# Patient Record
Sex: Female | Born: 1940
Health system: Southern US, Community
[De-identification: ages and names within clinical notes are randomized; demographics above are authoritative.]

## PROBLEM LIST (undated history)

## (undated) DIAGNOSIS — E669 Obesity, unspecified: Secondary | ICD-10-CM

## (undated) DIAGNOSIS — J329 Chronic sinusitis, unspecified: Secondary | ICD-10-CM

## (undated) DIAGNOSIS — K219 Gastro-esophageal reflux disease without esophagitis: Secondary | ICD-10-CM

## (undated) DIAGNOSIS — M1710 Unilateral primary osteoarthritis, unspecified knee: Secondary | ICD-10-CM

## (undated) DIAGNOSIS — K449 Diaphragmatic hernia without obstruction or gangrene: Secondary | ICD-10-CM

## (undated) DIAGNOSIS — F17200 Nicotine dependence, unspecified, uncomplicated: Secondary | ICD-10-CM

## (undated) DIAGNOSIS — R911 Solitary pulmonary nodule: Secondary | ICD-10-CM

## (undated) DIAGNOSIS — J449 Chronic obstructive pulmonary disease, unspecified: Secondary | ICD-10-CM

## (undated) DIAGNOSIS — I10 Essential (primary) hypertension: Secondary | ICD-10-CM

## (undated) DIAGNOSIS — M179 Osteoarthritis of knee, unspecified: Secondary | ICD-10-CM

## (undated) HISTORY — DX: Chronic sinusitis, unspecified: J32.9

## (undated) HISTORY — PX: OTHER SURGICAL HISTORY: SHX169

## (undated) HISTORY — DX: Unilateral primary osteoarthritis, unspecified knee: M17.10

## (undated) HISTORY — DX: Gastro-esophageal reflux disease without esophagitis: K21.9

## (undated) HISTORY — PX: PARTIAL HYSTERECTOMY: SHX80

## (undated) HISTORY — DX: Nicotine dependence, unspecified, uncomplicated: F17.200

## (undated) HISTORY — DX: Osteoarthritis of knee, unspecified: M17.9

## (undated) HISTORY — DX: Solitary pulmonary nodule: R91.1

## (undated) HISTORY — DX: Essential (primary) hypertension: I10

## (undated) HISTORY — DX: Obesity, unspecified: E66.9

## (undated) HISTORY — DX: Chronic obstructive pulmonary disease, unspecified: J44.9

## (undated) HISTORY — DX: Diaphragmatic hernia without obstruction or gangrene: K44.9

---

## 2002-07-23 DIAGNOSIS — I10 Essential (primary) hypertension: Secondary | ICD-10-CM

## 2002-07-23 HISTORY — DX: Essential (primary) hypertension: I10

## 2003-06-29 ENCOUNTER — Ambulatory Visit (HOSPITAL_COMMUNITY): Admission: RE | Admit: 2003-06-29 | Discharge: 2003-06-29 | Payer: Self-pay | Admitting: Family Medicine

## 2003-07-13 ENCOUNTER — Emergency Department (HOSPITAL_COMMUNITY): Admission: EM | Admit: 2003-07-13 | Discharge: 2003-07-13 | Payer: Self-pay | Admitting: Emergency Medicine

## 2003-11-22 ENCOUNTER — Ambulatory Visit (HOSPITAL_COMMUNITY): Admission: RE | Admit: 2003-11-22 | Discharge: 2003-11-22 | Payer: Self-pay | Admitting: Family Medicine

## 2004-03-20 ENCOUNTER — Ambulatory Visit (HOSPITAL_COMMUNITY): Admission: RE | Admit: 2004-03-20 | Discharge: 2004-03-20 | Payer: Self-pay | Admitting: Family Medicine

## 2004-04-18 ENCOUNTER — Ambulatory Visit (HOSPITAL_COMMUNITY): Admission: RE | Admit: 2004-04-18 | Discharge: 2004-04-18 | Payer: Self-pay | Admitting: Family Medicine

## 2004-04-22 HISTORY — PX: COLONOSCOPY: SHX174

## 2004-04-26 LAB — HM COLONOSCOPY: HM Colonoscopy: NORMAL

## 2004-05-18 ENCOUNTER — Ambulatory Visit (HOSPITAL_COMMUNITY): Admission: RE | Admit: 2004-05-18 | Discharge: 2004-05-18 | Payer: Self-pay | Admitting: General Surgery

## 2004-05-25 ENCOUNTER — Ambulatory Visit: Payer: Self-pay | Admitting: Family Medicine

## 2004-05-31 ENCOUNTER — Ambulatory Visit: Payer: Self-pay | Admitting: Family Medicine

## 2004-07-04 ENCOUNTER — Ambulatory Visit: Payer: Self-pay | Admitting: Family Medicine

## 2004-08-21 ENCOUNTER — Ambulatory Visit: Payer: Self-pay | Admitting: Family Medicine

## 2004-12-19 ENCOUNTER — Ambulatory Visit: Payer: Self-pay | Admitting: Family Medicine

## 2004-12-20 ENCOUNTER — Ambulatory Visit: Payer: Self-pay | Admitting: *Deleted

## 2004-12-21 ENCOUNTER — Ambulatory Visit (HOSPITAL_COMMUNITY): Admission: RE | Admit: 2004-12-21 | Discharge: 2004-12-21 | Payer: Self-pay | Admitting: Family Medicine

## 2004-12-22 ENCOUNTER — Ambulatory Visit (HOSPITAL_COMMUNITY): Admission: RE | Admit: 2004-12-22 | Discharge: 2004-12-22 | Payer: Self-pay | Admitting: Family Medicine

## 2004-12-26 ENCOUNTER — Ambulatory Visit: Payer: Self-pay | Admitting: Cardiology

## 2004-12-26 ENCOUNTER — Encounter (HOSPITAL_COMMUNITY): Admission: RE | Admit: 2004-12-26 | Discharge: 2005-01-25 | Payer: Self-pay | Admitting: *Deleted

## 2005-01-01 ENCOUNTER — Ambulatory Visit: Payer: Self-pay | Admitting: *Deleted

## 2005-01-11 ENCOUNTER — Encounter (INDEPENDENT_AMBULATORY_CARE_PROVIDER_SITE_OTHER): Payer: Self-pay | Admitting: General Surgery

## 2005-01-11 ENCOUNTER — Ambulatory Visit (HOSPITAL_COMMUNITY): Admission: RE | Admit: 2005-01-11 | Discharge: 2005-01-11 | Payer: Self-pay | Admitting: General Surgery

## 2005-01-30 ENCOUNTER — Ambulatory Visit: Payer: Self-pay | Admitting: Family Medicine

## 2005-03-13 ENCOUNTER — Ambulatory Visit: Payer: Self-pay | Admitting: Family Medicine

## 2005-04-02 ENCOUNTER — Ambulatory Visit: Payer: Self-pay | Admitting: Family Medicine

## 2005-04-14 ENCOUNTER — Emergency Department (HOSPITAL_COMMUNITY): Admission: EM | Admit: 2005-04-14 | Discharge: 2005-04-14 | Payer: Self-pay | Admitting: Emergency Medicine

## 2005-04-16 ENCOUNTER — Ambulatory Visit (HOSPITAL_COMMUNITY): Admission: RE | Admit: 2005-04-16 | Discharge: 2005-04-16 | Payer: Self-pay | Admitting: Family Medicine

## 2005-04-16 ENCOUNTER — Ambulatory Visit: Payer: Self-pay | Admitting: Family Medicine

## 2005-04-19 ENCOUNTER — Ambulatory Visit: Payer: Self-pay | Admitting: Orthopedic Surgery

## 2005-05-03 ENCOUNTER — Ambulatory Visit: Payer: Self-pay | Admitting: Orthopedic Surgery

## 2005-05-29 ENCOUNTER — Ambulatory Visit: Payer: Self-pay | Admitting: Orthopedic Surgery

## 2005-05-29 ENCOUNTER — Encounter: Payer: Self-pay | Admitting: Orthopedic Surgery

## 2005-05-29 ENCOUNTER — Inpatient Hospital Stay (HOSPITAL_COMMUNITY): Admission: RE | Admit: 2005-05-29 | Discharge: 2005-06-03 | Payer: Self-pay | Admitting: Orthopedic Surgery

## 2005-05-29 HISTORY — PX: OTHER SURGICAL HISTORY: SHX169

## 2005-06-11 ENCOUNTER — Ambulatory Visit: Payer: Self-pay | Admitting: Orthopedic Surgery

## 2005-06-18 ENCOUNTER — Ambulatory Visit: Payer: Self-pay | Admitting: Orthopedic Surgery

## 2005-06-28 ENCOUNTER — Ambulatory Visit: Payer: Self-pay | Admitting: Orthopedic Surgery

## 2005-07-03 ENCOUNTER — Encounter (HOSPITAL_COMMUNITY): Admission: RE | Admit: 2005-07-03 | Discharge: 2005-07-18 | Payer: Self-pay | Admitting: Orthopedic Surgery

## 2005-07-05 ENCOUNTER — Ambulatory Visit: Payer: Self-pay | Admitting: Family Medicine

## 2005-07-25 ENCOUNTER — Encounter (HOSPITAL_COMMUNITY): Admission: RE | Admit: 2005-07-25 | Discharge: 2005-08-24 | Payer: Self-pay | Admitting: Orthopedic Surgery

## 2005-08-02 ENCOUNTER — Ambulatory Visit: Payer: Self-pay | Admitting: Orthopedic Surgery

## 2005-08-16 ENCOUNTER — Ambulatory Visit: Payer: Self-pay | Admitting: Family Medicine

## 2005-08-24 ENCOUNTER — Encounter (HOSPITAL_COMMUNITY): Admission: RE | Admit: 2005-08-24 | Discharge: 2005-09-23 | Payer: Self-pay | Admitting: Orthopedic Surgery

## 2005-09-03 ENCOUNTER — Ambulatory Visit: Payer: Self-pay | Admitting: Orthopedic Surgery

## 2005-09-24 ENCOUNTER — Encounter (HOSPITAL_COMMUNITY): Admission: RE | Admit: 2005-09-24 | Discharge: 2005-10-24 | Payer: Self-pay | Admitting: Orthopedic Surgery

## 2005-11-14 ENCOUNTER — Ambulatory Visit: Payer: Self-pay | Admitting: Orthopedic Surgery

## 2005-11-26 ENCOUNTER — Ambulatory Visit (HOSPITAL_COMMUNITY): Admission: RE | Admit: 2005-11-26 | Discharge: 2005-11-26 | Payer: Self-pay | Admitting: Family Medicine

## 2005-11-26 ENCOUNTER — Ambulatory Visit: Payer: Self-pay | Admitting: Family Medicine

## 2005-12-27 ENCOUNTER — Ambulatory Visit (HOSPITAL_COMMUNITY): Admission: RE | Admit: 2005-12-27 | Discharge: 2005-12-27 | Payer: Self-pay | Admitting: Family Medicine

## 2006-01-26 ENCOUNTER — Encounter: Payer: Self-pay | Admitting: Family Medicine

## 2006-01-31 ENCOUNTER — Other Ambulatory Visit: Admission: RE | Admit: 2006-01-31 | Discharge: 2006-01-31 | Payer: Self-pay | Admitting: Family Medicine

## 2006-01-31 ENCOUNTER — Ambulatory Visit: Payer: Self-pay | Admitting: Family Medicine

## 2006-03-28 ENCOUNTER — Ambulatory Visit: Payer: Self-pay | Admitting: Family Medicine

## 2006-04-25 ENCOUNTER — Ambulatory Visit: Payer: Self-pay | Admitting: Orthopedic Surgery

## 2006-05-09 ENCOUNTER — Ambulatory Visit: Payer: Self-pay | Admitting: Family Medicine

## 2006-08-21 ENCOUNTER — Ambulatory Visit: Payer: Self-pay | Admitting: Family Medicine

## 2006-08-29 ENCOUNTER — Ambulatory Visit (HOSPITAL_COMMUNITY): Admission: RE | Admit: 2006-08-29 | Discharge: 2006-08-29 | Payer: Self-pay | Admitting: Family Medicine

## 2006-09-09 ENCOUNTER — Ambulatory Visit: Payer: Self-pay | Admitting: Family Medicine

## 2006-09-10 ENCOUNTER — Encounter: Payer: Self-pay | Admitting: Family Medicine

## 2006-09-11 ENCOUNTER — Ambulatory Visit (HOSPITAL_COMMUNITY): Admission: RE | Admit: 2006-09-11 | Discharge: 2006-09-11 | Payer: Self-pay | Admitting: Family Medicine

## 2006-09-18 ENCOUNTER — Ambulatory Visit: Payer: Self-pay | Admitting: Family Medicine

## 2006-09-28 ENCOUNTER — Emergency Department (HOSPITAL_COMMUNITY): Admission: EM | Admit: 2006-09-28 | Discharge: 2006-09-28 | Payer: Self-pay | Admitting: Emergency Medicine

## 2006-10-03 ENCOUNTER — Ambulatory Visit: Payer: Self-pay | Admitting: Orthopedic Surgery

## 2006-11-20 ENCOUNTER — Ambulatory Visit: Payer: Self-pay | Admitting: Family Medicine

## 2006-11-21 ENCOUNTER — Encounter: Payer: Self-pay | Admitting: Family Medicine

## 2006-11-21 LAB — CONVERTED CEMR LAB
ALT: 12 units/L (ref 0–35)
AST: 12 units/L (ref 0–37)
Alkaline Phosphatase: 97 units/L (ref 39–117)
Bilirubin, Direct: 0.1 mg/dL (ref 0.0–0.3)
Cholesterol: 180 mg/dL (ref 0–200)
Indirect Bilirubin: 0.5 mg/dL (ref 0.0–0.9)
Total Protein: 7 g/dL (ref 6.0–8.3)

## 2007-01-13 ENCOUNTER — Ambulatory Visit (HOSPITAL_COMMUNITY): Admission: RE | Admit: 2007-01-13 | Discharge: 2007-01-13 | Payer: Self-pay | Admitting: Family Medicine

## 2007-02-17 ENCOUNTER — Ambulatory Visit: Payer: Self-pay | Admitting: Orthopedic Surgery

## 2007-03-28 ENCOUNTER — Ambulatory Visit: Payer: Self-pay | Admitting: Family Medicine

## 2007-03-28 LAB — CONVERTED CEMR LAB
BUN: 11 mg/dL (ref 6–23)
Basophils Relative: 1 % (ref 0–1)
Creatinine, Ser: 0.86 mg/dL (ref 0.40–1.20)
Eosinophils Absolute: 0.3 10*3/uL (ref 0.0–0.7)
Eosinophils Relative: 3 % (ref 0–5)
HCT: 39.3 % (ref 36.0–46.0)
HDL: 45 mg/dL (ref >39)
Indirect Bilirubin: 0.4 mg/dL (ref 0.0–0.9)
Lymphs Abs: 2.6 10*3/uL (ref 0.7–3.3)
MCHC: 34.4 g/dL (ref 30.0–36.0)
MCV: 86.9 fL (ref 78.0–100.0)
Neutro Abs: 4 10*3/uL (ref 1.7–7.7)
Platelets: 282 10*3/uL (ref 150–400)
RDW: 14.7 % — ABNORMAL HIGH (ref 11.5–14.0)
WBC: 7.6 10*3/uL (ref 4.0–10.5)

## 2007-04-08 ENCOUNTER — Ambulatory Visit: Payer: Self-pay | Admitting: Cardiology

## 2007-04-15 ENCOUNTER — Encounter: Payer: Self-pay | Admitting: Orthopedic Surgery

## 2007-04-23 ENCOUNTER — Ambulatory Visit (HOSPITAL_COMMUNITY): Admission: RE | Admit: 2007-04-23 | Discharge: 2007-04-23 | Payer: Self-pay | Admitting: Family Medicine

## 2007-04-23 ENCOUNTER — Ambulatory Visit: Payer: Self-pay | Admitting: Family Medicine

## 2007-05-07 ENCOUNTER — Ambulatory Visit: Payer: Self-pay | Admitting: Family Medicine

## 2007-05-07 ENCOUNTER — Ambulatory Visit: Payer: Self-pay | Admitting: Cardiology

## 2007-06-13 ENCOUNTER — Ambulatory Visit: Payer: Self-pay | Admitting: Family Medicine

## 2007-07-24 ENCOUNTER — Encounter: Payer: Self-pay | Admitting: Family Medicine

## 2007-07-28 ENCOUNTER — Ambulatory Visit: Payer: Self-pay | Admitting: Family Medicine

## 2007-07-29 ENCOUNTER — Encounter: Payer: Self-pay | Admitting: Family Medicine

## 2007-07-29 LAB — CONVERTED CEMR LAB
ALT: 11 units/L (ref 0–35)
AST: 15 units/L (ref 0–37)
Alkaline Phosphatase: 121 units/L — ABNORMAL HIGH (ref 39–117)
Bilirubin, Direct: 0.1 mg/dL (ref 0.0–0.3)
Helicobacter Pylori Antibody-IgG: 0.6
Total CHOL/HDL Ratio: 3.4
Total Protein: 7.4 g/dL (ref 6.0–8.3)

## 2007-10-28 ENCOUNTER — Ambulatory Visit: Payer: Self-pay | Admitting: Family Medicine

## 2007-10-30 ENCOUNTER — Encounter: Payer: Self-pay | Admitting: Family Medicine

## 2007-10-30 DIAGNOSIS — F17200 Nicotine dependence, unspecified, uncomplicated: Secondary | ICD-10-CM | POA: Insufficient documentation

## 2007-10-30 DIAGNOSIS — J4489 Other specified chronic obstructive pulmonary disease: Secondary | ICD-10-CM | POA: Insufficient documentation

## 2007-10-30 DIAGNOSIS — J449 Chronic obstructive pulmonary disease, unspecified: Secondary | ICD-10-CM

## 2007-10-30 DIAGNOSIS — I10 Essential (primary) hypertension: Secondary | ICD-10-CM

## 2007-10-30 DIAGNOSIS — M1710 Unilateral primary osteoarthritis, unspecified knee: Secondary | ICD-10-CM | POA: Insufficient documentation

## 2007-10-30 DIAGNOSIS — E669 Obesity, unspecified: Secondary | ICD-10-CM

## 2007-11-04 ENCOUNTER — Encounter: Payer: Self-pay | Admitting: Family Medicine

## 2008-01-14 ENCOUNTER — Ambulatory Visit (HOSPITAL_COMMUNITY): Admission: RE | Admit: 2008-01-14 | Discharge: 2008-01-14 | Payer: Self-pay | Admitting: Family Medicine

## 2008-01-27 ENCOUNTER — Ambulatory Visit: Payer: Self-pay | Admitting: Family Medicine

## 2008-01-27 LAB — CONVERTED CEMR LAB
AST: 17 units/L (ref 0–37)
BUN: 15 mg/dL (ref 6–23)
Bilirubin, Direct: 0.1 mg/dL (ref 0.0–0.3)
Cholesterol: 210 mg/dL — ABNORMAL HIGH (ref 0–200)
Creatinine, Ser: 0.91 mg/dL (ref 0.40–1.20)
Glucose, Bld: 109 mg/dL — ABNORMAL HIGH (ref 70–99)
HDL: 41 mg/dL (ref >39)
Indirect Bilirubin: 0.3 mg/dL (ref 0.0–0.9)
LDL Cholesterol: 143 mg/dL — ABNORMAL HIGH (ref 0–99)
Potassium: 4.3 meq/L (ref 3.5–5.3)
Sodium: 140 meq/L (ref 135–145)
Total CHOL/HDL Ratio: 5.1
VLDL: 26 mg/dL (ref 0–40)

## 2008-01-28 ENCOUNTER — Telehealth: Payer: Self-pay | Admitting: Orthopedic Surgery

## 2008-02-13 ENCOUNTER — Ambulatory Visit: Payer: Self-pay | Admitting: Family Medicine

## 2008-03-02 ENCOUNTER — Ambulatory Visit: Payer: Self-pay | Admitting: Family Medicine

## 2008-03-02 ENCOUNTER — Ambulatory Visit (HOSPITAL_COMMUNITY): Admission: RE | Admit: 2008-03-02 | Discharge: 2008-03-02 | Payer: Self-pay | Admitting: Family Medicine

## 2008-03-02 DIAGNOSIS — I509 Heart failure, unspecified: Secondary | ICD-10-CM | POA: Insufficient documentation

## 2008-03-02 DIAGNOSIS — E785 Hyperlipidemia, unspecified: Secondary | ICD-10-CM | POA: Insufficient documentation

## 2008-03-03 ENCOUNTER — Encounter: Payer: Self-pay | Admitting: Family Medicine

## 2008-03-03 LAB — CONVERTED CEMR LAB
Calcium: 8.9 mg/dL (ref 8.4–10.5)
Chloride: 101 meq/L (ref 96–112)
Creatinine, Ser: 0.68 mg/dL (ref 0.40–1.20)
Glucose, Bld: 104 mg/dL — ABNORMAL HIGH (ref 70–99)
Sodium: 141 meq/L (ref 135–145)

## 2008-04-19 ENCOUNTER — Other Ambulatory Visit: Admission: RE | Admit: 2008-04-19 | Discharge: 2008-04-19 | Payer: Self-pay | Admitting: Family Medicine

## 2008-04-19 ENCOUNTER — Encounter: Payer: Self-pay | Admitting: Family Medicine

## 2008-04-19 ENCOUNTER — Ambulatory Visit: Payer: Self-pay | Admitting: Family Medicine

## 2008-04-19 DIAGNOSIS — H547 Unspecified visual loss: Secondary | ICD-10-CM

## 2008-05-25 ENCOUNTER — Encounter: Payer: Self-pay | Admitting: Family Medicine

## 2008-05-26 LAB — CONVERTED CEMR LAB
ALT: 16 units/L (ref 0–35)
BUN: 13 mg/dL (ref 6–23)
Bilirubin, Direct: 0.1 mg/dL (ref 0.0–0.3)
CO2: 22 meq/L (ref 19–32)
Chloride: 106 meq/L (ref 96–112)
Cholesterol: 138 mg/dL (ref 0–200)
Indirect Bilirubin: 0.5 mg/dL (ref 0.0–0.9)
Total CHOL/HDL Ratio: 3.1
Triglycerides: 97 mg/dL (ref <150)

## 2008-05-27 ENCOUNTER — Ambulatory Visit: Payer: Self-pay | Admitting: Family Medicine

## 2008-05-27 ENCOUNTER — Encounter: Payer: Self-pay | Admitting: Family Medicine

## 2008-05-28 ENCOUNTER — Encounter: Payer: Self-pay | Admitting: Family Medicine

## 2008-05-28 LAB — CONVERTED CEMR LAB: Microalb Creat Ratio: 6.7 mg/g (ref 0.0–30.0)

## 2008-05-31 DIAGNOSIS — R7303 Prediabetes: Secondary | ICD-10-CM

## 2008-06-21 ENCOUNTER — Ambulatory Visit: Payer: Self-pay | Admitting: Family Medicine

## 2008-06-21 DIAGNOSIS — R5383 Other fatigue: Secondary | ICD-10-CM

## 2008-06-21 DIAGNOSIS — R5381 Other malaise: Secondary | ICD-10-CM

## 2008-06-21 LAB — CONVERTED CEMR LAB
Basophils Absolute: 0.1 10*3/uL (ref 0.0–0.1)
Eosinophils Relative: 3 % (ref 0–5)
HCT: 39.9 % (ref 36.0–46.0)
Lymphocytes Relative: 37 % (ref 12–46)
Monocytes Relative: 10 % (ref 3–12)
Neutro Abs: 4.4 10*3/uL (ref 1.7–7.7)
RBC: 4.58 M/uL (ref 3.87–5.11)
RDW: 15.4 % (ref 11.5–15.5)

## 2008-07-22 ENCOUNTER — Ambulatory Visit: Payer: Self-pay | Admitting: Family Medicine

## 2008-09-16 ENCOUNTER — Ambulatory Visit: Payer: Self-pay | Admitting: Family Medicine

## 2008-09-16 LAB — CONVERTED CEMR LAB
Bilirubin Urine: NEGATIVE
Blood in Urine, dipstick: NEGATIVE
Glucose, Bld: 114 mg/dL
Glucose, Urine, Semiquant: NEGATIVE
Hgb A1c MFr Bld: 6.5 %
Nitrite: NEGATIVE
Protein, U semiquant: NEGATIVE
Specific Gravity, Urine: 1.015
Urobilinogen, UA: 0.2
WBC Urine, dipstick: NEGATIVE
pH: 5.5

## 2008-10-18 ENCOUNTER — Ambulatory Visit: Payer: Self-pay | Admitting: Family Medicine

## 2008-10-18 LAB — CONVERTED CEMR LAB: Blood Glucose, Fasting: 125 mg/dL

## 2008-10-25 ENCOUNTER — Ambulatory Visit (HOSPITAL_COMMUNITY): Admission: RE | Admit: 2008-10-25 | Discharge: 2008-10-25 | Payer: Self-pay | Admitting: Family Medicine

## 2008-11-11 ENCOUNTER — Emergency Department (HOSPITAL_COMMUNITY): Admission: EM | Admit: 2008-11-11 | Discharge: 2008-11-12 | Payer: Self-pay | Admitting: Emergency Medicine

## 2008-11-15 ENCOUNTER — Encounter: Payer: Self-pay | Admitting: Family Medicine

## 2008-11-15 LAB — CONVERTED CEMR LAB
ALT: 12 units/L (ref 0–35)
Albumin: 3.9 g/dL (ref 3.5–5.2)
BUN: 12 mg/dL (ref 6–23)
Bilirubin, Direct: 0.2 mg/dL (ref 0.0–0.3)
Chloride: 103 meq/L (ref 96–112)
Creatinine, Ser: 0.81 mg/dL (ref 0.40–1.20)
Glucose, Bld: 116 mg/dL — ABNORMAL HIGH (ref 70–99)
HDL: 49 mg/dL (ref >39)
Indirect Bilirubin: 0.4 mg/dL (ref 0.0–0.9)
LDL Cholesterol: 122 mg/dL — ABNORMAL HIGH (ref 0–99)
Potassium: 4.6 meq/L (ref 3.5–5.3)
Total Bilirubin: 0.6 mg/dL (ref 0.3–1.2)
Total CHOL/HDL Ratio: 3.9
Total Protein: 7.2 g/dL (ref 6.0–8.3)
Triglycerides: 92 mg/dL (ref <150)
VLDL: 18 mg/dL (ref 0–40)

## 2008-11-26 ENCOUNTER — Encounter: Payer: Self-pay | Admitting: Family Medicine

## 2008-12-15 ENCOUNTER — Ambulatory Visit: Payer: Self-pay | Admitting: Family Medicine

## 2008-12-15 LAB — CONVERTED CEMR LAB: Hgb A1c MFr Bld: 5.6 % (ref 4.6–6.1)

## 2009-01-17 ENCOUNTER — Ambulatory Visit (HOSPITAL_COMMUNITY): Admission: RE | Admit: 2009-01-17 | Discharge: 2009-01-17 | Payer: Self-pay | Admitting: Family Medicine

## 2009-02-04 ENCOUNTER — Encounter: Payer: Self-pay | Admitting: Family Medicine

## 2009-02-15 ENCOUNTER — Ambulatory Visit (HOSPITAL_COMMUNITY): Admission: RE | Admit: 2009-02-15 | Discharge: 2009-02-15 | Payer: Self-pay | Admitting: Ophthalmology

## 2009-02-17 LAB — CONVERTED CEMR LAB
BUN: 13 mg/dL (ref 6–23)
Bilirubin, Direct: 0.1 mg/dL (ref 0.0–0.3)
Calcium: 9 mg/dL (ref 8.4–10.5)
HDL: 51 mg/dL (ref >39)
Total Bilirubin: 0.5 mg/dL (ref 0.3–1.2)
Total Protein: 7.5 g/dL (ref 6.0–8.3)
VLDL: 18 mg/dL (ref 0–40)

## 2009-03-01 ENCOUNTER — Ambulatory Visit (HOSPITAL_COMMUNITY): Admission: RE | Admit: 2009-03-01 | Discharge: 2009-03-01 | Payer: Self-pay | Admitting: Ophthalmology

## 2009-03-17 ENCOUNTER — Ambulatory Visit: Payer: Self-pay | Admitting: Family Medicine

## 2009-03-17 LAB — CONVERTED CEMR LAB: Hgb A1c MFr Bld: 6.2 %

## 2009-04-14 ENCOUNTER — Ambulatory Visit (HOSPITAL_COMMUNITY): Admission: RE | Admit: 2009-04-14 | Discharge: 2009-04-14 | Payer: Self-pay | Admitting: Family Medicine

## 2009-04-14 ENCOUNTER — Ambulatory Visit: Payer: Self-pay | Admitting: Family Medicine

## 2009-04-14 DIAGNOSIS — M79609 Pain in unspecified limb: Secondary | ICD-10-CM

## 2009-04-14 DIAGNOSIS — M25579 Pain in unspecified ankle and joints of unspecified foot: Secondary | ICD-10-CM

## 2009-04-14 LAB — CONVERTED CEMR LAB: Glucose, Bld: 148 mg/dL

## 2009-04-18 LAB — CONVERTED CEMR LAB
BUN: 14 mg/dL (ref 6–23)
Basophils Relative: 0 % (ref 0–1)
CO2: 23 meq/L (ref 19–32)
Chloride: 101 meq/L (ref 96–112)
Eosinophils Absolute: 0.3 10*3/uL (ref 0.0–0.7)
Eosinophils Relative: 3 % (ref 0–5)
Hemoglobin: 13 g/dL (ref 12.0–15.0)
Lymphocytes Relative: 34 % (ref 12–46)
Lymphs Abs: 3.2 10*3/uL (ref 0.7–4.0)
MCHC: 34.4 g/dL (ref 30.0–36.0)
Neutro Abs: 5.2 10*3/uL (ref 1.7–7.7)
Neutrophils Relative %: 55 % (ref 43–77)
Sodium: 139 meq/L (ref 135–145)
TSH: 0.779 microintl units/mL (ref 0.350–4.500)
WBC: 9.4 10*3/uL (ref 4.0–10.5)

## 2009-05-26 ENCOUNTER — Ambulatory Visit: Payer: Self-pay | Admitting: Family Medicine

## 2009-06-07 ENCOUNTER — Telehealth: Payer: Self-pay | Admitting: Family Medicine

## 2009-08-22 ENCOUNTER — Ambulatory Visit: Payer: Self-pay | Admitting: Family Medicine

## 2009-08-22 DIAGNOSIS — IMO0002 Reserved for concepts with insufficient information to code with codable children: Secondary | ICD-10-CM | POA: Insufficient documentation

## 2009-08-22 LAB — CONVERTED CEMR LAB: Blood Glucose, Fasting: 109 mg/dL

## 2009-08-23 ENCOUNTER — Telehealth: Payer: Self-pay | Admitting: Family Medicine

## 2009-08-24 ENCOUNTER — Encounter (INDEPENDENT_AMBULATORY_CARE_PROVIDER_SITE_OTHER): Payer: Self-pay

## 2009-08-24 LAB — CONVERTED CEMR LAB
AST: 18 units/L (ref 0–37)
Albumin: 3.9 g/dL (ref 3.5–5.2)
Alkaline Phosphatase: 90 units/L (ref 39–117)
Calcium: 8.8 mg/dL (ref 8.4–10.5)
Chloride: 103 meq/L (ref 96–112)
Creatinine, Urine: 143.1 mg/dL
Glucose, Bld: 93 mg/dL (ref 70–99)
HDL: 48 mg/dL (ref >39)
LDL Cholesterol: 116 mg/dL — ABNORMAL HIGH (ref 0–99)
Microalb Creat Ratio: 17.5 mg/g (ref 0.0–30.0)
Potassium: 4 meq/L (ref 3.5–5.3)
Sodium: 142 meq/L (ref 135–145)
Total CHOL/HDL Ratio: 3.8
Total Protein: 6.9 g/dL (ref 6.0–8.3)
Triglycerides: 79 mg/dL (ref <150)

## 2009-11-16 ENCOUNTER — Ambulatory Visit: Payer: Self-pay | Admitting: Family Medicine

## 2009-11-22 LAB — CONVERTED CEMR LAB
ALT: 10 units/L (ref 0–35)
Alkaline Phosphatase: 109 units/L (ref 39–117)
BUN: 11 mg/dL (ref 6–23)
CO2: 26 meq/L (ref 19–32)
Calcium: 8.4 mg/dL (ref 8.4–10.5)
Glucose, Bld: 94 mg/dL (ref 70–99)
Indirect Bilirubin: 0.6 mg/dL (ref 0.0–0.9)
Sodium: 138 meq/L (ref 135–145)
Total Protein: 7.3 g/dL (ref 6.0–8.3)

## 2010-01-19 ENCOUNTER — Ambulatory Visit (HOSPITAL_COMMUNITY): Admission: RE | Admit: 2010-01-19 | Discharge: 2010-01-19 | Payer: Self-pay | Admitting: Family Medicine

## 2010-02-15 ENCOUNTER — Ambulatory Visit: Payer: Self-pay | Admitting: Family Medicine

## 2010-02-15 DIAGNOSIS — M899 Disorder of bone, unspecified: Secondary | ICD-10-CM | POA: Insufficient documentation

## 2010-02-15 DIAGNOSIS — M949 Disorder of cartilage, unspecified: Secondary | ICD-10-CM

## 2010-02-15 DIAGNOSIS — J209 Acute bronchitis, unspecified: Secondary | ICD-10-CM

## 2010-02-21 LAB — CONVERTED CEMR LAB
ALT: 12 units/L (ref 0–35)
Bilirubin, Direct: 0.1 mg/dL (ref 0.0–0.3)
Chloride: 100 meq/L (ref 96–112)
Cholesterol: 194 mg/dL (ref 0–200)
Creatinine, Ser: 0.92 mg/dL (ref 0.40–1.20)
Hgb A1c MFr Bld: 5.5 % (ref <5.7)
Potassium: 4.4 meq/L (ref 3.5–5.3)
Total Protein: 7.5 g/dL (ref 6.0–8.3)
VLDL: 30 mg/dL (ref 0–40)
Vit D, 25-Hydroxy: 15 ng/mL — ABNORMAL LOW (ref 30–89)

## 2010-05-02 ENCOUNTER — Other Ambulatory Visit: Admission: RE | Admit: 2010-05-02 | Discharge: 2010-05-02 | Payer: Self-pay | Admitting: Family Medicine

## 2010-05-02 ENCOUNTER — Ambulatory Visit: Payer: Self-pay | Admitting: Family Medicine

## 2010-05-02 LAB — CONVERTED CEMR LAB: OCCULT 1: NEGATIVE

## 2010-05-06 ENCOUNTER — Encounter: Payer: Self-pay | Admitting: Family Medicine

## 2010-05-06 DIAGNOSIS — J0100 Acute maxillary sinusitis, unspecified: Secondary | ICD-10-CM

## 2010-05-09 ENCOUNTER — Encounter: Payer: Self-pay | Admitting: Family Medicine

## 2010-05-09 ENCOUNTER — Telehealth: Payer: Self-pay | Admitting: Family Medicine

## 2010-05-17 ENCOUNTER — Encounter: Payer: Self-pay | Admitting: Family Medicine

## 2010-05-29 ENCOUNTER — Encounter: Payer: Self-pay | Admitting: Orthopedic Surgery

## 2010-05-30 ENCOUNTER — Ambulatory Visit: Payer: Self-pay | Admitting: Orthopedic Surgery

## 2010-05-30 DIAGNOSIS — IMO0002 Reserved for concepts with insufficient information to code with codable children: Secondary | ICD-10-CM

## 2010-05-30 DIAGNOSIS — M25569 Pain in unspecified knee: Secondary | ICD-10-CM

## 2010-07-20 LAB — CONVERTED CEMR LAB
ALT: 12 units/L (ref 0–35)
AST: 14 units/L (ref 0–37)
Albumin: 4 g/dL (ref 3.5–5.2)
Alkaline Phosphatase: 80 units/L (ref 39–117)
Basophils Absolute: 0 10*3/uL (ref 0.0–0.1)
Basophils Relative: 1 % (ref 0–1)
Bilirubin, Direct: 0.1 mg/dL (ref 0.0–0.3)
Calcium: 8.6 mg/dL (ref 8.4–10.5)
Cholesterol: 235 mg/dL — ABNORMAL HIGH (ref 0–200)
Eosinophils Absolute: 0.2 10*3/uL (ref 0.0–0.7)
Eosinophils Relative: 3 % (ref 0–5)
HCT: 35.8 % — ABNORMAL LOW (ref 36.0–46.0)
HDL: 52 mg/dL (ref >39)
Hgb A1c MFr Bld: 6 % — ABNORMAL HIGH (ref <5.7)
MCHC: 34.6 g/dL (ref 30.0–36.0)
MCV: 88.4 fL (ref 78.0–100.0)
Platelets: 259 10*3/uL (ref 150–400)
RDW: 14.5 % (ref 11.5–15.5)
Sodium: 142 meq/L (ref 135–145)
Total CHOL/HDL Ratio: 4.5

## 2010-08-03 ENCOUNTER — Ambulatory Visit
Admission: RE | Admit: 2010-08-03 | Discharge: 2010-08-03 | Payer: Self-pay | Source: Home / Self Care | Attending: Family Medicine | Admitting: Family Medicine

## 2010-08-03 DIAGNOSIS — J019 Acute sinusitis, unspecified: Secondary | ICD-10-CM | POA: Insufficient documentation

## 2010-08-24 NOTE — Progress Notes (Signed)
  Phone Note Call from Patient   Summary of Call: Patient called in and said that she had been lightheaded today and wanted a work excuse because she had just got the job and didn't want to lose it. Advised her if she had to take the flexeril to only take half of it because it can have hat effect per dr. Lodema Hong. Patient can come in for work excuse for today only. Initial call taken by: Everitt Amber,  August 23, 2009 1:01 PM

## 2010-08-24 NOTE — Letter (Signed)
Summary: med review sheet  med review sheet   Imported By: Rudene Anda 05/02/2010 16:55:15  _____________________________________________________________________  External Attachment:    Type:   Image     Comment:   External Document

## 2010-08-24 NOTE — Progress Notes (Signed)
Summary: CALL BACK  Phone Note Call from Patient   Summary of Call: WANTS YOU TO CALL HER BACK BEFORE 8:50 AM SHE HAS TO GO TO WORK Initial call taken by: Lind Guest,  May 09, 2010 8:12 AM  Follow-up for Phone Call        noted Follow-up by: Adella Hare LPN,  May 09, 2010 12:12 PM

## 2010-08-24 NOTE — Letter (Signed)
Summary: CONSULTS  CONSULTS   Imported By: Lind Guest 04/13/2010 09:19:06  _____________________________________________________________________  External Attachment:    Type:   Image     Comment:   External Document

## 2010-08-24 NOTE — Letter (Signed)
Summary: X RAYS  X RAYS   Imported By: Lind Guest 04/13/2010 09:25:59  _____________________________________________________________________  External Attachment:    Type:   Image     Comment:   External Document

## 2010-08-24 NOTE — Assessment & Plan Note (Signed)
Summary: physical   Vital Signs:  Patient profile:   70 year old female Menstrual status:  hysterectomy Height:      67 inches Weight:      210.25 pounds BMI:     33.05 O2 Sat:      97 % on Room air Pulse rate:   112 / minute Pulse rhythm:   regular Resp:     16 per minute BP sitting:   128 / 74  (left arm)  Vitals Entered By: Mauricia Area, CMA  Nutrition Counseling: Patient's BMI is greater than 25 and therefore counseled on weight management options.  O2 Flow:  Room air CC: follow up   CC:  follow up.  History of Present Illness: Reports  that she has been doing fairly well. 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 Denies recent fever or chills. Denies  ear pain or sore throat. Denies chest congestion, or cough productive of sputum. Denies chest pain, palpitations, PND, orthopnea or leg swelling. Denies abdominal pain, nausea, vomitting, diarrhea or constipation. Denies change in bowel movements or bloody stool. Denies dysuria , frequency, incontinence or hesitancy. reports intermittent  joint pain, swith r reduced mobility. Denies headaches, vertigo, seizures. Denies depression,she does have mild  anxiety and  insomnia. Denies  rash, lesions, or itch.     Preventive Screening-Counseling & Management  Alcohol-Tobacco     Smoking Cessation Counseling: yes  Allergies (verified): No Known Drug Allergies  Review of Systems      See HPI General:  Complains of fatigue. ENT:  Complains of nasal congestion, postnasal drainage, and sinus pressure; 10 day history, drainage is at times yellow. MS:  Complains of joint pain, low back pain, and stiffness. Psych:  Complains of anxiety; denies depression. Endo:  Denies excessive thirst and excessive urination; tests sugars regularly, and they remain within  the target rangea. Heme:  Denies abnormal bruising and  bleeding. Allergy:  Complains of seasonal allergies; denies hives or rash and itching eyes.  Physical Exam  General:  Well-developed,well-nourished,in no acute distress; alert,appropriate and cooperative throughout examination Head:  Normocephalic and atraumatic without obvious abnormalities. No apparent alopecia or balding.positive maxillary sinus tenderness Eyes:  No corneal or conjunctival inflammation noted. EOMI. Perrla. Funduscopic exam benign, without hemorrhages, exudates or papilledema. Vision grossly normal. Ears:  External ear exam shows no significant lesions or deformities.  Otoscopic examination reveals clear canals, tympanic membranes are intact bilaterally without bulging, retraction, inflammation or discharge. Hearing is grossly normal bilaterally. Nose:  External nasal examination shows no deformity or inflammation. Nasal mucosa are pink and moist without lesions or exudates. Mouth:  pharynx pink and moist and fair dentition.   Neck:  No deformities, masses, or tenderness noted. Chest Wall:  No deformities, masses, or tenderness noted. Breasts:  No mass, nodules, thickening, tenderness, bulging, retraction, inflamation, nipple discharge or skin changes noted.   Lungs:  Normal respiratory effort, chest expands symmetrically. Lungs are clear to auscultation, no crackles or wheezes. Heart:  Normal rate and regular rhythm. S1 and S2 normal without gallop, murmur, click, rub or other extra sounds. Abdomen:  Bowel sounds positive,abdomen soft and non-tender without masses, organomegaly or hernias noted. Rectal:  No external abnormalities noted. Normal sphincter tone. No rectal masses or tenderness.Guaic negative stool Genitalia:  normal introitus, no external lesions, mucosa pink and moist, and no adnexal masses or tenderness.  Uterus is absent  Msk:  No deformity  or scoliosis noted of thoracic or lumbar spine.   Pulses:  R and L carotid,radial,femoral,dorsalis pedis and posterior  tibial pulses are full and equal bilaterally Extremities:  decreased ROM thoracolumbar spine Neurologic:  No cranial nerve deficits noted. Station and gait are normal. Plantar reflexes are down-going bilaterally. DTRs are symmetrical throughout. Sensory, motor and coordinative functions appear intact.  Diabetes Management Exam:    Foot Exam (with socks and/or shoes not present):       Sensory-Monofilament:          Left foot: diminished          Right foot: diminished       Inspection:          Left foot: normal          Right foot: normal       Nails:          Left foot: thickened          Right foot: thickened   Impression & Recommendations:  Problem # 1:  DIABETES MELLITUS, TYPE II (ICD-250.00) Assessment Comment Only  The following medications were removed from the medication list:    Metformin Hcl 500 Mg Tabs (Metformin hcl) ..... One tab by mouth bid Her updated medication list for this problem includes:    Bayer Aspirin 325 Mg Tabs (Aspirin) .Marland Kitchen... Take 1 tablet by mouth once a day plan to d/c the metformin basedon normal hBA1C, will have nursing call her on this and fax d/c order to the pharmacy Labs Reviewed: Creat: 0.92 (02/21/2010)    Reviewed HgBA1c results: 5.5 (02/21/2010)  5.7 (11/19/2009)  Problem # 2:  HYPERLIPIDEMIA (ICD-272.4) Assessment: Comment Only  Her updated medication list for this problem includes:    Lovastatin 40 Mg Tabs (Lovastatin) .Marland Kitchen... 2 tablets at bedtime Low fat dietdiscussed and encouraged  Orders: T-Lipid Profile [id number]) T-Hepatic Function [phone])  Labs Reviewed: SGOT: 15 (02/21/2010)   SGPT: 12 (02/21/2010)   HDL:55 (02/21/2010), 52 (11/19/2009)  LDL:109 (02/21/2010), 128 (11/19/2009)  Chol:194 (02/21/2010), 200 (11/19/2009)  Trig:148 (02/21/2010), 102 (11/19/2009)  Problem # 3:  HYPERTENSION (ICD-401.9) Assessment: Unchanged  Her updated medication list for this problem includes:    Hydrochlorothiazide 25 Mg Tabs  (Hydrochlorothiazide) .Marland Kitchen... Take 1 tablet by mouth once a day    Amlodipine Besylate 10 Mg Tabs (Amlodipine besylate) .Marland Kitchen... Take 1 tablet by mouth once a day  Orders: T-Basic Metabolic Panel [phone])  BP today: 128/74 Prior BP: 124/80 (02/15/2010)  Labs Reviewed: K+: 4.4 (02/21/2010) Creat: : 0.92 (02/21/2010)   Chol: 194 (02/21/2010)   HDL: 55 (02/21/2010)   LDL: 109 (02/21/2010)   TG: 148 (02/21/2010)  Problem # 4:  OBESITY (ICD-278.00) Assessment: Unchanged  Ht: 67 (05/02/2010)   Wt: 210.25 (05/02/2010)   BMI: 33.05 (05/02/2010) therapeutic lifestyle change discussed and encouraged  Problem # 5:  NICOTINE ADDICTION (ICD-305.1) Assessment: Unchanged  Encouraged smoking cessation and discussed different methods for smoking cessation.   Problem # 6:  ACUTE MAXILLARY SINUSITIS (ICD-461.0)  The following medications were removed from the medication list:    Flonase 50 Mcg/act Susp (Fluticasone propionate) .Marland Kitchen... 2 puffs per nostril daily as needed    Penicillin V Potassium 500 Mg Tabs (Penicillin v potassium) .Marland Kitchen... Take 1 tablet by mouth three times a day Her updated medication list for this problem includes:    Septra Ds 800-160 Mg Tabs (Sulfamethoxazole-trimethoprim) .Marland Kitchen... Take 1 tablet by mouth two times a day  Complete Medication List: 1)  Klor-con M20 20 Meq Cr-tabs (  Potassium chloride crys cr) .... Take one tab by mouth once daily 2)  Geritol Complete Tabs (Iron-vitamins) .... Take one tab by mouth once daily 3)  Hydrochlorothiazide 25 Mg Tabs (Hydrochlorothiazide) .... Take 1 tablet by mouth once a day 4)  Amlodipine Besylate 10 Mg Tabs (Amlodipine besylate) .... Take 1 tablet by mouth once a day 5)  Accu-chek Compact Test Drum Strp (Glucose blood) .... Uad 6)  Accu-chek Multiclix Lancets Misc (Lancets) .... Uad 7)  Bayer Aspirin 325 Mg Tabs (Aspirin) .... Take 1 tablet by mouth once a day 8)  Advair Diskus 100-50 Mcg/dose Misc (Fluticasone-salmeterol) .Marland Kitchen.. 1 puff  twice daily 9)  Ventolin Hfa 108 (90 Base) Mcg/act Aers (Albuterol sulfate) .... 2 puffs every 6-8 hours as needed 10)  Duoneb 0.5-2.5 (3) Mg/31ml Soln (Ipratropium-albuterol) .... Three times a day prn 11)  Lovastatin 40 Mg Tabs (Lovastatin) .... 2 tablets at bedtime 12)  Septra Ds 800-160 Mg Tabs (Sulfamethoxazole-trimethoprim) .... Take 1 tablet by mouth two times a day 13)  Fluconazole 150 Mg Tabs (Fluconazole) .... Take 1 tablet by mouth once a day 14)  Cyclobenzaprine Hcl 10 Mg Tabs (Cyclobenzaprine hcl) .... Take 1 tab by mouth at bedtime as needed 15)  Prilosec 20 Mg Cpdr (Omeprazole) .... Take 1 capsule by mouth once a day as needed 16)  Aloe Vera 5000 Mg Caps (Aloe vera) .... One cap by mouth once daily 17)  Relacore Max Strength  .... One cap by mouth once daily 18)  Vitamin D 1000 Unit Tabs (Cholecalciferol) .... One cap by mouth once daily 19)  Vitamin B-12 1000 Mcg Tabs (Cyanocobalamin) .... One tab by mouth once daily  Other Orders: T-CBC w/Diff ([id number]) T- Hemoglobin A1C (09811-91478) Pap Smear (29562) Hemoccult Guaiac-1 spec.(in office) (13086)  Patient Instructions: 1)  Follow up appointment in 5.44months 2)  BMP prior to visit, ICD-9: 3)  Hepatic Panel prior to visit, ICD-9: 4)  Lipid Panel prior to visit, ICD-9:  fasting labs first or second week in December 5)  TSH prior to visit, ICD-9: 6)  CBC w/ Diff prior to visit, ICD-9: 7)  HbgA1C prior to visit, ICD-9: 8)  med sent in for sinuses. 9)  flu vac today 10)  Tobacco is very bad for your health and your loved ones! You Should stop smoking!. 11)  Stop Smoking Tips: Choose a Quit date. Cut down before the Quit date. decide what you will do as a substitute when you feel the urge to smoke(gum,toothpick,exercise). 12)  It is important that you exercise regularly at least 20 minutes 5 times a week. If you develop chest pain, have severe difficulty breathing, or feel very tired , stop exercising immediately and seek  medical attention. 13)  You need to lose weight. Consider a lower calorie diet and regular exercise.  Prescriptions: FLUCONAZOLE 150 MG TABS (FLUCONAZOLE) Take 1 tablet by mouth once a day  #1 x 0   Entered and Authorized by:   Syliva Overman MD   Signed by:   Syliva Overman MD on 05/02/2010   Method used:   Electronically to        [facility] [phone]* (retail)       [address]       [place], [place]  [address]       Ph: [phone] or [phone]       Fax: [phone]   RxID:   [id number] SEPTRA DS 800-160 MG TABS (SULFAMETHOXAZOLE-TRIMETHOPRIM)  Take 1 tablet by mouth two times a day  #14 x 0   Entered and Authorized by:   Syliva Overman MD   Signed by:   Syliva Overman MD on 05/02/2010   Method used:   Electronically to        [facility] [phone]* (retail)       [address]       [place], [place]  [address]       Ph: [phone] or [phone]       Fax: [phone]   RxID:   [id number]   Laboratory Results  Date/Time Received: May 02, 2010 4:30 PM  Date/Time Reported: May 02, 2010 4:30 PM   Stool - Occult Blood Hemmoccult #1: negative Date: 05/02/2010 Comments: 118 10/12 51301 13L 10/13 Adella Hare LPN  May 02, 2010 4:30 PM      Appended Document: physical pls contact pt , let her know based on her blood sugars I would like her to discontinue the metformin, I do not believe she needs this to control her sugars her dx of dibetes is changed to prediabetes, she needs to get the labs ordered in Dec, this will give me a good idea of how she is doing. I have written a d/c order to be faxed to her pharmacy , pls fax it  Appended Document: physical called patient, left message  Appended Document: physical called patient, left message  Appended Document: physical patient aware  Appended Document: physical   Influenza Vaccine    Vaccine Type: Fluvax MCR    Site: right deltoid    Mfr:  NOVARTIS    Dose: 0.5 ml    Route: IM    Given by: Adella Hare LPN    Exp. Date: 11/2010    Lot #: [id number]    VIS given: 02/13/07 version given May 09, 2010. GIVEN AT [facility]

## 2010-08-24 NOTE — Letter (Signed)
Summary: P HONE NOTES  P HONE NOTES   Imported By: Lind Guest 04/13/2010 09:23:03  _____________________________________________________________________  External Attachment:    Type:   Image     Comment:   External Document

## 2010-08-24 NOTE — Letter (Signed)
Summary: OFFICE NOTES  OFFICE NOTES   Imported By: Lind Guest 04/13/2010 09:22:20  _____________________________________________________________________  External Attachment:    Type:   Image     Comment:   External Document

## 2010-08-24 NOTE — Assessment & Plan Note (Signed)
Summary: office visit   Vital Signs:  Patient profile:   70 year old female Menstrual status:  hysterectomy Height:      67 inches Weight:      204.50 pounds BMI:     32.15 O2 Sat:      96 % Pulse rate:   95 / minute Pulse rhythm:   regular Resp:     16 per minute BP sitting:   124 / 80  (left arm) Cuff size:   large  Vitals Entered By: Everitt Amber LPN (February 15, 2010 2:34 PM)  Nutrition Counseling: Patient's BMI is greater than 25 and therefore counseled on weight management options. CC: has some congestion in chest for a couple weeks, producing yellowish phlegm   CC:  has some congestion in chest for a couple weeks and producing yellowish phlegm.  History of Present Illness: Reports  that she has been  doing well. Denies recent fever or chills. Denies sinus pressure, nasal congestion , ear pain or sore throat.  Denies chest pain, palpitations, PND, orthopnea or leg swelling. Denies abdominal pain, nausea, vomitting, diarrhea or constipation. Denies change in bowel movements or bloody stool. Denies dysuria , frequency, incontinence or hesitancy. . Denies headaches, vertigo, seizures. Denies depression, anxiety or insomnia. Denies  rash, lesions, or itch. Tests her blood sugar regularly,a nd fasting s are seldom over 120     Preventive Screening-Counseling & Management  Alcohol-Tobacco     Smoking Cessation Counseling: yes  Current Medications (verified): 1)  Klor-Con M20 20 Meq  Cr-Tabs (Potassium Chloride Crys Cr) .... Take One Tab By Mouth Once Daily 2)  Geritol Complete   Tabs (Iron-Vitamins) .... Take One Tab By Mouth Once Daily 3)  Hydrochlorothiazide 25 Mg  Tabs (Hydrochlorothiazide) .... Take 1 Tablet By Mouth Once A Day 4)  Amlodipine Besylate 10 Mg Tabs (Amlodipine Besylate) .... Take 1 Tablet By Mouth Once A Day 5)  Accu-Chek Compact Test Drum  Strp (Glucose Blood) .... Uad 6)  Accu-Chek Multiclix Lancets  Misc (Lancets) .... Uad 7)  Metformin Hcl 500  Mg Tabs (Metformin Hcl) .... One Tab By Mouth Bid 8)  Bayer Aspirin 325 Mg Tabs (Aspirin) .... Take 1 Tablet By Mouth Once A Day 9)  Prilosec Otc 20 Mg Tbec (Omeprazole Magnesium) .... Take 1 Tablet By Mouth Once A Day 10)  Advair Diskus 100-50 Mcg/dose Misc (Fluticasone-Salmeterol) .Marland Kitchen.. 1 Puff Twice Daily 11)  Ventolin Hfa 108 (90 Base) Mcg/act Aers (Albuterol Sulfate) .... 2 Puffs Every 6-8 Hours As Needed 12)  Duoneb 0.5-2.5 (3) Mg/69ml Soln (Ipratropium-Albuterol) .... Three Times A Day Prn 13)  Lovastatin 40 Mg Tabs (Lovastatin) .... 2 Tablets At Bedtime 14)  Flonase 50 Mcg/act Susp (Fluticasone Propionate) .... 2 Puffs Per Nostril Daily As Needed 15)  Zyrtec Hives Relief 10 Mg Tabs (Cetirizine Hcl) .... Take 1 Tablet By Mouth Once A Day 16)  Meloxicam 15 Mg Tabs (Meloxicam) .... Take 1 Tablet By Mouth Once A Day 17)  Cyclobenzaprine Hcl 10 Mg Tabs (Cyclobenzaprine Hcl) .... Take 1 Tab By Mouth At Bedtime  Allergies (verified): No Known Drug Allergies  Past History:  Past medical, surgical, family and social histories (including risk factors) reviewed for relevance to current acute and chronic problems.  Past Medical History: Reviewed history from 06/21/2008 and no changes required. Current Problems:  SINUSITIS (ICD-473.9) OSTEOARTHRITIS, KNEE (ICD-715.96) Current Problems:  NICOTINE ADDICTION (ICD-305.1) COPD (ICD-496) OBESITY (ICD-278.00) HYPERTENSION (ICD-401.9) SINUSITIS (ICD-473.9) OSTEOARTHRITIS, KNEE (ICD-715.96) TYPE 2 DM   2009  Past Surgical History: Reviewed history from 03/17/2009 and no changes required. Knee Arthroscopy right Cholecystectomy Total Knee Arthroplasty right Dr. Romeo Apple 05-29-05 bilateral catarct surgery, 07/27 and 08 04/2009, dr Nile Riggs.  Family History: Reviewed history from 10/30/2007 and no changes required. Mom deceased HTN,DM,Cad Dad deceased pneunom Sister 2 living 2HTN Brothers 2 living 2HTN  Social History: Reviewed history from  10/30/2007 and no changes required. 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 Resp:  Complains of cough, shortness of breath, sputum productive, and wheezing; 2 week h/o chest congestion, yeloow foul smelling  sputum, no fever or chiills, still smoking 1 pack per week. MS:  Complains of joint pain and stiffness; bilateral knee pain and stiffness. Endo:  Denies cold intolerance, excessive hunger, excessive thirst, excessive urination, heat intolerance, polyuria, and weight change; twice per month. Heme:  Denies abnormal bruising and bleeding. Allergy:  Complains of seasonal allergies; denies hives or rash and itching eyes.  Physical Exam  General:  Well-developed,well-nourished,in no acute distress; alert,appropriate and cooperative throughout examination HEENT: No facial asymmetry,  EOMI, No sinus tenderness, TM's Clear, oropharynx  pink and moist.   Chest: decreased air entry, scattered crackles and wheezes CVS: S1, S2, No murmurs, No S3.   Abd: Soft, Nontender.  MS: decreased  ROM spine, hips, shoulders and knees.  Ext: No edema.   CNS: CN 2-12 intact, power tone and sensation normal throughout.   Skin: Intact, no visible lesions or rashes.  Psych: Good eye contact, normal affect.  Memory intact, not anxious or depressed appearing.    Impression & Recommendations:  Problem # 1:  ACUTE BRONCHITIS (ICD-466.0) Assessment Comment Only  Her updated medication list for this problem includes:    Advair Diskus 100-50 Mcg/dose Misc (Fluticasone-salmeterol) .Marland Kitchen... 1 puff twice daily    Ventolin Hfa 108 (90 Base) Mcg/act Aers (Albuterol sulfate) .Marland Kitchen... 2 puffs every 6-8 hours as needed    Duoneb 0.5-2.5 (3) Mg/61ml Soln (Ipratropium-albuterol) .Marland Kitchen... Three times a day prn    Penicillin V Potassium 500 Mg Tabs (Penicillin v potassium) .Marland Kitchen... Take 1 tablet by mouth three times a  day  Problem # 2:  BACK PAIN WITH RADICULOPATHY (ICD-729.2) Assessment: Unchanged  Problem # 3:  DIABETES MELLITUS, TYPE II (ICD-250.00) Assessment: Comment Only  Her updated medication list for this problem includes:    Metformin Hcl 500 Mg Tabs (Metformin hcl) ..... One tab by mouth bid    Bayer Aspirin 325 Mg Tabs (Aspirin) .Marland Kitchen... Take 1 tablet by mouth once a day  Orders: T- Hemoglobin A1C ([id number])  Labs Reviewed: Creat: 0.76 (11/19/2009)    Reviewed HgBA1c results: 5.7 (11/19/2009)  5.9 (08/22/2009)  Problem # 4:  HYPERTENSION (ICD-401.9) Assessment: Unchanged  Her updated medication list for this problem includes:    Hydrochlorothiazide 25 Mg Tabs (Hydrochlorothiazide) .Marland Kitchen... Take 1 tablet by mouth once a day    Amlodipine Besylate 10 Mg Tabs (Amlodipine besylate) .Marland Kitchen... Take 1 tablet by mouth once a day  BP today: 124/80 Prior BP: 122/80 (11/16/2009)  Labs Reviewed: K+: 4.2 (11/19/2009) Creat: : 0.76 (11/19/2009)   Chol: 200 (11/19/2009)   HDL: 52 (11/19/2009)   LDL: 128 (11/19/2009)   TG: 102 (11/19/2009)  Problem # 5:  NICOTINE ADDICTION (ICD-305.1) Assessment: Unchanged  Encouraged smoking cessation and discussed different methods for smoking cessation.   Problem # 6:  OSTEOARTHRITIS, KNEE (ICD-715.96) Assessment: Deteriorated  Her updated  medication list for this problem includes:    Bayer Aspirin 325 Mg Tabs (Aspirin) .Marland Kitchen... Take 1 tablet by mouth once a day    Meloxicam 15 Mg Tabs (Meloxicam) .Marland Kitchen... Take 1 tablet by mouth once a day    Cyclobenzaprine Hcl 10 Mg Tabs (Cyclobenzaprine hcl) .Marland Kitchen... Take 1 tab by mouth at bedtime  Orders: Depo- Medrol 80mg  (J1040) Ketorolac-Toradol 15mg  (E4540) Admin of Therapeutic Inj  intramuscular or subcutaneous (98119)  Complete Medication List: 1)  Klor-con M20 20 Meq Cr-tabs (Potassium chloride crys cr) .... Take one tab by mouth once daily 2)  Geritol Complete Tabs (Iron-vitamins) .... Take one tab by mouth once  daily 3)  Hydrochlorothiazide 25 Mg Tabs (Hydrochlorothiazide) .... Take 1 tablet by mouth once a day 4)  Amlodipine Besylate 10 Mg Tabs (Amlodipine besylate) .... Take 1 tablet by mouth once a day 5)  Accu-chek Compact Test Drum Strp (Glucose blood) .... Uad 6)  Accu-chek Multiclix Lancets Misc (Lancets) .... Uad 7)  Metformin Hcl 500 Mg Tabs (Metformin hcl) .... One tab by mouth bid 8)  Bayer Aspirin 325 Mg Tabs (Aspirin) .... Take 1 tablet by mouth once a day 9)  Prilosec Otc 20 Mg Tbec (Omeprazole magnesium) .... Take 1 tablet by mouth once a day 10)  Advair Diskus 100-50 Mcg/dose Misc (Fluticasone-salmeterol) .Marland Kitchen.. 1 puff twice daily 11)  Ventolin Hfa 108 (90 Base) Mcg/act Aers (Albuterol sulfate) .... 2 puffs every 6-8 hours as needed 12)  Duoneb 0.5-2.5 (3) Mg/46ml Soln (Ipratropium-albuterol) .... Three times a day prn 13)  Lovastatin 40 Mg Tabs (Lovastatin) .... 2 tablets at bedtime 14)  Flonase 50 Mcg/act Susp (Fluticasone propionate) .... 2 puffs per nostril daily as needed 15)  Zyrtec Hives Relief 10 Mg Tabs (Cetirizine hcl) .... Take 1 tablet by mouth once a day 16)  Meloxicam 15 Mg Tabs (Meloxicam) .... Take 1 tablet by mouth once a day 17)  Cyclobenzaprine Hcl 10 Mg Tabs (Cyclobenzaprine hcl) .... Take 1 tab by mouth at bedtime 18)  Penicillin V Potassium 500 Mg Tabs (Penicillin v potassium) .... Take 1 tablet by mouth three times a day  Other Orders: T-Basic Metabolic Panel [phone]) T-Hepatic Function [phone]) T-Lipid Profile [phone]) T-Vitamin D (25-Hydroxy) [phone])  Patient Instructions: 1)  cPE early october. 2)  Tobacco is very bad for your health and your loved ones! You Should stop smoking!. 3)  Stop Smoking Tips: Choose a Quit date. Cut down before the Quit date. decide what you will do as a substitute when you feel the urge to smoke(gum,toothpick,exercise). 4)  It is important that you exercise regularly at least 20 minutes 5 times a week. If you  develop chest pain, have severe difficulty breathing, or feel very tired , stop exercising immediately and seek medical attention. 5)  You need to lose weight. Consider a lower calorie diet and regular exercise.  6)  BMP prior to visit, ICD-9: 7)  Hepatic Panel prior to visit, ICD-9: 8)  Lipid Panel prior to visit, ICD-9:   fasting next week 9)  HbgA1C prior to visit, ICD-9: 10)  Vitamin d 11)  injections today for your knee pain, and also med sent in for your bronchitis Prescriptions: METFORMIN HCL 500 MG TABS (METFORMIN HCL) ONE TAB by mouth BID  #60 Tablet x 3   Entered by:   Everitt Amber LPN   Authorized by:   Syliva Overman MD   Signed by:   Everitt Amber LPN on 66/44/0347   Method used:  Electronically to        [facility]. [phone]* (retail)       [address]       [place], [place]  [address]       Ph: [phone] or [phone]       Fax: [phone]   RxID:   [id number] PENICILLIN V POTASSIUM 500 MG TABS (PENICILLIN V POTASSIUM) Take 1 tablet by mouth three times a day  #30 x 0   Entered and Authorized by:   Syliva Overman MD   Signed by:   Syliva Overman MD on 02/15/2010   Method used:   Electronically to        [facility] [phone]* (retail)       [address]       [place], [place]  [address]       Ph: [phone] or [phone]       Fax: [phone]   RxID:   [id number]    Medication Administration  Injection # 1:    Medication: Depo- Medrol 80mg     Diagnosis: OSTEOARTHRITIS, KNEE (ICD-715.96)    Route: IM    Site: RUOQ gluteus    Exp Date: 11/2010    Lot #: Dorothy [id number]    Mfr: Pharmacia    Comments: 80mg  given     Patient tolerated injection without complications    Given by: Everitt Amber LPN (February 15, 2010 3:16 PM)  Injection # 2:    Medication: Ketorolac-Toradol 15mg     Diagnosis: OSTEOARTHRITIS, KNEE (ICD-715.96)    Route: IM    Site: LUOQ gluteus    Exp Date: 09/2011    Lot #: [id number]    Mfr:  novaplus    Comments: 60mg  given     Patient tolerated injection without complications    Given by: Everitt Amber LPN (February 15, 2010 3:16 PM)  Orders Added: 1)  Est. Patient Level IV [10626] 2)  T-Basic Metabolic Panel [[id number]] 3)  T-Hepatic Function [80076-22960] 4)  T-Lipid Profile [80061-22930] 5)  T- Hemoglobin A1C [83036-23375] 6)  T-Vitamin D (25-Hydroxy) [50093-81829] 7)  Depo- Medrol 80mg  [J1040] 8)  Ketorolac-Toradol 15mg  [J1885] 9)  Admin of Therapeutic Inj  intramuscular or subcutaneous [93716]

## 2010-08-24 NOTE — Assessment & Plan Note (Signed)
Summary: left knee pain needs xr/bcbs/sec hor/bsf   Visit Type:  new patient Referring Provider:  self  CC:  left knee pain.  History of Present Illness: I saw [name] in the office today for an initial visit.  She is a 70 years old woman with the complaint of:  left knee pain.  Xrays today.  Medications: Lovastatin 40 mg, Meloxicam 15 mg, Klor-con 20 mg, Amlodipine 10 mg, Aspirin 81 mg, Hydrochlorat 25 mg.  This patient had a RIGHT total knee replacement and did well. She now presents with swelling on the medial side of her LEFT knee with increasing pain over the last month without associated injury. 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 She does have sharp throbbing pain over the pes bursa with skin changes over the area. Some of the swelling has gone down, but it is still quite swollen, tender, and painful for her. She does have some weightbearing pain, but it doesn't seem to be in the knee joint itself.  Allergies: No Known Drug Allergies  Past History:  Past Medical History: Last updated: 06/21/2008 Current Problems:  SINUSITIS (ICD-473.9) OSTEOARTHRITIS, KNEE (ICD-715.96) Current Problems:  NICOTINE ADDICTION (ICD-305.1) COPD (ICD-496) OBESITY (ICD-278.00) HYPERTENSION (ICD-401.9) SINUSITIS (ICD-473.9) OSTEOARTHRITIS, KNEE (ICD-715.96) TYPE 2 DM   2009  Past Surgical History: Last updated: 03/17/2009 Knee Arthroscopy right Cholecystectomy Total Knee Arthroplasty right Dr. Romeo Apple 05-29-05 bilateral catarct surgery, 07/27 and 08 04/2009, dr Nile Riggs.  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 Cardiovascular:  Denies chest pain, palpitations, fainting, and murmurs. Respiratory:  Complains of short of breath and snoring; denies wheezing, couch, tightness, pain on inspiration, and snoring . Gastrointestinal:  Complains of constipation; denies heartburn, nausea, vomiting, diarrhea, and blood in your stools. Genitourinary:  Denies frequency, urgency, difficulty urinating, painful urination, flank pain, and bleeding in urine. Neurologic:  Denies numbness, tingling, unsteady gait, dizziness, tremors, and seizure. Musculoskeletal:  Complains of joint pain, swelling, redness, and heat; denies instability, stiffness, and muscle pain. Endocrine:  Denies excessive thirst, exessive urination, and heat or cold intolerance. Psychiatric:  Denies nervousness, depression, anxiety, and hallucinations. Skin:  Denies changes in the skin, poor healing, rash, itching, and redness. HEENT:  Denies blurred or double vision, eye pain, redness, and watering. Immunology:  Complains of seasonal allergies; denies sinus problems and allergic to bee stings. Hemoatologic:  Denies easy bleeding and brusing.  Physical Exam  Skin:  intact without lesions or rashes Inguinal Nodes:  no significant adenopathy Psych:  alert and cooperative; normal mood and affect; normal attention span and concentration   Knee Exam  General:    Well-developed, well-nourished, normal body habitus; no deformities, normal grooming.  216 lbs  75ft 6in   18 RR  Gait:    slightly antalgic   Skin:    there is increased pigmentation over the area of the pes bursa.  Inspection:    swelling is noted over the medial pes bursa of the LEFT knee with responding tenderness in the same area  Vascular:    There  was no swelling or varicose veins. The pulses and temperature are  normal. There was no edema or tenderness.  Sensory:    Gross coordination and sensation were normal.    Motor:    Motor strength 5/5 bilaterally for quadriceps, hamstrings, ankle dorsiflexion, and ankle plantar flexion.    Reflexes:    Normal and symmetric patellar and Achilles reflexes bilaterally.    Knee Exam:    Right:    Inspection:  Normal    Palpation:  Normal    Stability:  stable    Tenderness:  no    Swelling:  no    ROM = 115     Left:    Inspection/Palpation:  Full range of motion noted in the LEFT knee with tenderness over the pes bursa. There is a skin discoloration here, which is new for the patient. There is no tenderness over the joint line. McMurray sign is negative. She has full range of motion as stated.     Impression & Recommendations:  Problem # 1:  ANSERINE BURSITIS, LEFT (ICD-726.61)  x-rays  3 views of the LEFT knee are obtained.  Findings:there is a moderate amount of varus deformity to the LEFT knee with joint space narrowing medially. There is some mild patellofemoral disease as well.  Impression: osteoarthritis LEFT knee moderate  inject LEFT knee bursa Verbal consent was obtained. The knee was prepped with alcohol and ethyl chloride. 1 cc of depomedrol 40mg /cc and 4 cc of lidocaine 1% was injected. there were no complications.  Orders: New Patient Level III (28413) Joint Aspirate / Injection, Large (20610) Depo- Medrol 40mg  (J1030) Knee x-ray,  3 views (24401)  Problem # 2:  KNEE PAIN (ICD-719.46)  Her updated medication list for this problem includes:    Bayer Aspirin 325 Mg Tabs (Aspirin) .Marland Kitchen... Take 1 tablet by mouth once a day    Cyclobenzaprine Hcl 10 Mg Tabs (Cyclobenzaprine hcl) .Marland Kitchen... Take 1 tab by mouth at bedtime as needed  Orders: New Patient Level III (02725)  Problem # 3:  KNEE, ARTHRITIS, DEGEN./OSTEO (ICD-715.96)  Her updated medication list for this problem includes:    Bayer Aspirin 325 Mg Tabs (Aspirin) .Marland Kitchen...  Take 1 tablet by mouth once a day    Cyclobenzaprine Hcl 10 Mg Tabs (Cyclobenzaprine hcl) .Marland Kitchen... Take 1 tab by mouth at bedtime as needed  Orders: New Patient Level III (36644)  Patient Instructions: 1)  Aspercreme three times a day to the knee  2)  You have received an injection of cortisone today. You may experience increased pain at the injection site. Apply ice pack to the area for 20 minutes every 2 hours and take 2 xtra strength tylenol every 8 hours. This increased pain will usually resolve in 24 hours. The injection will take effect in 3-10 days.  3)  Please schedule a follow-up appointment as needed.   Orders Added: 1)  New Patient Level III [03474] 2)  Joint Aspirate / Injection, Large [20610] 3)  Depo- Medrol 40mg  [J1030] 4)  Knee x-ray,  3 views [73562]

## 2010-08-24 NOTE — Letter (Signed)
Summary: Handicapp sticker renewal  Handicapp sticker renewal   Imported By: Jacklynn Ganong 05/30/2010 16:19:22  _____________________________________________________________________  External Attachment:    Type:   Image     Comment:   External Document

## 2010-08-24 NOTE — Letter (Signed)
Summary: history form  history form   Imported By: Jacklynn Ganong 06/01/2010 08:43:09  _____________________________________________________________________  External Attachment:    Type:   Image     Comment:   External Document

## 2010-08-24 NOTE — Letter (Signed)
Summary: HISTORY AND PHYSICAL  HISTORY AND PHYSICAL   Imported By: Lind Guest 04/13/2010 09:19:45  _____________________________________________________________________  External Attachment:    Type:   Image     Comment:   External Document

## 2010-08-24 NOTE — Letter (Signed)
Summary: DEMO  DEMO   Imported By: Lind Guest 04/13/2010 09:26:23  _____________________________________________________________________  External Attachment:    Type:   Image     Comment:   External Document

## 2010-08-24 NOTE — Letter (Signed)
Summary: MISC  MISC   Imported By: Lind Guest 04/13/2010 09:21:33  _____________________________________________________________________  External Attachment:    Type:   Image     Comment:   External Document

## 2010-08-24 NOTE — Assessment & Plan Note (Signed)
Summary: office visit   Vital Signs:  Patient profile:   70 year old female Menstrual status:  hysterectomy Height:      67 inches Weight:      207 pounds BMI:     32.54 O2 Sat:      97 % Pulse rate:   91 / minute Pulse rhythm:   regular Resp:     16 per minute BP sitting:   122 / 80  (left arm) Cuff size:   large  Vitals Entered By: Everitt Amber LPN (November 16, 2009 9:20 AM)  Nutrition Counseling: Patient's BMI is greater than 25 and therefore counseled on weight management options. CC: Follow up chronic problems   CC:  Follow up chronic problems.  History of Present Illness: Reports  that she has been  doing well. Denies recent fever or chills. Denies sinus pressure, nasal congestion , ear pain or sore throat. Denies chest congestion, or cough productive of sputum.She reports tha t sh e is currently experiencing increased allergy symptoms which involves post nasal drainage and cough which is dry. Denies chest pain, palpitations, PND, orthopnea or leg swelling. Denies abdominal pain, nausea, vomitting, diarrhea or constipation. Denies change in bowel movements or bloody stool. Denies dysuria , frequency, incontinence or hesitancy. Reports   joint pain, and  reduced mobility. Denies headaches, vertigo, seizures. 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 Denies  rash, lesions, or itch.     Current Medications (verified): 1)  Klor-Con M20 20 Meq  Cr-Tabs (Potassium Chloride Crys Cr) .... Take One Tab By Mouth Once Daily 2)  Geritol Complete   Tabs (Iron-Vitamins) .... Take One Tab By Mouth Once Daily 3)  Hydrochlorothiazide 25 Mg  Tabs (Hydrochlorothiazide) .... Take 1 Tablet By Mouth Once A Day 4)  Amlodipine Besylate 10 Mg Tabs (Amlodipine Besylate) .... Take 1 Tablet By Mouth Once A Day 5)  Accu-Chek Compact  Test Drum  Strp (Glucose Blood) .... Uad 6)  Accu-Chek Multiclix Lancets  Misc (Lancets) .... Uad 7)  Metformin Hcl 500 Mg Tabs (Metformin Hcl) .... One Tab By Mouth Bid 8)  Bayer Aspirin 325 Mg Tabs (Aspirin) .... Take 1 Tablet By Mouth Once A Day 9)  Prilosec Otc 20 Mg Tbec (Omeprazole Magnesium) .... Take 1 Tablet By Mouth Once A Day 10)  Advair Diskus 100-50 Mcg/dose Misc (Fluticasone-Salmeterol) .Marland Kitchen.. 1 Puff Twice Daily 11)  Ventolin Hfa 108 (90 Base) Mcg/act Aers (Albuterol Sulfate) .... 2 Puffs Every 6-8 Hours As Needed 12)  Duoneb 0.5-2.5 (3) Mg/49ml Soln (Ipratropium-Albuterol) .... Three Times A Day Prn 13)  Lovastatin 40 Mg Tabs (Lovastatin) .... 2 Tablets At Bedtime 14)  Flonase 50 Mcg/act Susp (Fluticasone Propionate) .... 2 Puffs Per Nostril Daily As Needed 15)  Zyrtec Hives Relief 10 Mg Tabs (Cetirizine Hcl) .... Take 1 Tablet By Mouth Once A Day 16)  Meloxicam 15 Mg Tabs (Meloxicam) .... Take 1 Tablet By Mouth Once A Day 17)  Cyclobenzaprine Hcl 10 Mg Tabs (Cyclobenzaprine Hcl) .... Take 1 Tab By Mouth At Bedtime  Allergies (verified): No Known Drug Allergies  Review of Systems      See HPI General:  Complains of fatigue. Eyes:  Denies discharge, eye pain, and red eye. MS:  Complains of joint pain, low back pain, mid back pain, and stiffness. Endo:  Denies cold intolerance, excessive hunger, excessive  thirst, excessive urination, heat intolerance, polyuria, and weight change; TESTS once daily, fasting sugars are seldom over 110. Heme:  Denies abnormal bruising and bleeding. Allergy:  Complains of seasonal allergies.  Physical Exam  General:  Well-developed,well-nourished,in no acute distress; alert,appropriate and cooperative throughout examination HEENT: No facial asymmetry,  EOMI, No sinus tenderness, TM's Clear, oropharynx  pink and moist.   Chest: Clear to auscultation bilaterally.  CVS: S1, S2, No murmurs, No S3.   Abd: Soft, Nontender.  MS: decreased  ROM spine,  hips, shoulders and knees.  Ext: No edema.   CNS: CN 2-12 intact, power tone and sensation normal throughout.   Skin: Intact, no visible lesions or rashes.  Psych: Good eye contact, normal affect.  Memory intact, not anxious or depressed appearing.    Impression & Recommendations:  Problem # 1:  BACK PAIN WITH RADICULOPATHY (ICD-729.2) Assessment Unchanged  Problem # 2:  DIABETES MELLITUS, TYPE II (ICD-250.00) Assessment: Comment Only  Her updated medication list for this problem includes:    Metformin Hcl 500 Mg Tabs (Metformin hcl) ..... One tab by mouth bid    Bayer Aspirin 325 Mg Tabs (Aspirin) .Marland Kitchen... Take 1 tablet by mouth once a day  Orders: T- Hemoglobin A1C ([id number])  Labs Reviewed: Creat: 0.86 (08/22/2009)    Reviewed HgBA1c results: 5.9 (08/22/2009)  6.2 (03/17/2009)  Problem # 3:  HYPERLIPIDEMIA (ICD-272.4) Assessment: Unchanged  Her updated medication list for this problem includes:    Lovastatin 40 Mg Tabs (Lovastatin) .Marland Kitchen... 2 tablets at bedtime  Orders: T-Hepatic Function [id number]) T-Lipid Profile [id number])  Labs Reviewed: SGOT: 18 (08/22/2009)   SGPT: 13 (08/22/2009)   HDL:48 (08/22/2009), 51 (02/17/2009)  LDL:116 (08/22/2009), 67 (57/84/6962)  Chol:180 (08/22/2009), 136 (02/17/2009)  Trig:79 (08/22/2009), 90 (02/17/2009)  Problem # 4:  NICOTINE ADDICTION (ICD-305.1) Assessment: Unchanged  Encouraged smoking cessation and discussed different methods for smoking cessation.   Problem # 5:  HYPERTENSION (ICD-401.9) Assessment: Unchanged  Her updated medication list for this problem includes:    Hydrochlorothiazide 25 Mg Tabs (Hydrochlorothiazide) .Marland Kitchen... Take 1 tablet by mouth once a day    Amlodipine Besylate 10 Mg Tabs (Amlodipine besylate) .Marland Kitchen... Take 1 tablet by mouth once a day  Orders: T-Basic Metabolic Panel [phone])  BP today: 122/80 Prior BP: 124/74 (08/22/2009)  Labs Reviewed: K+: 4.0 (08/22/2009) Creat: : 0.86  (08/22/2009)   Chol: 180 (08/22/2009)   HDL: 48 (08/22/2009)   LDL: 116 (08/22/2009)   TG: 79 (08/22/2009)  Complete Medication List: 1)  Klor-con M20 20 Meq Cr-tabs (Potassium chloride crys cr) .... Take one tab by mouth once daily 2)  Geritol Complete Tabs (Iron-vitamins) .... Take one tab by mouth once daily 3)  Hydrochlorothiazide 25 Mg Tabs (Hydrochlorothiazide) .... Take 1 tablet by mouth once a day 4)  Amlodipine Besylate 10 Mg Tabs (Amlodipine besylate) .... Take 1 tablet by mouth once a day 5)  Accu-chek Compact Test Drum Strp (Glucose blood) .... Uad 6)  Accu-chek Multiclix Lancets Misc (Lancets) .... Uad 7)  Metformin Hcl 500 Mg Tabs (Metformin hcl) .... One tab by mouth bid 8)  Bayer Aspirin 325 Mg Tabs (Aspirin) .... Take 1 tablet by mouth once a day 9)  Prilosec Otc 20 Mg Tbec (Omeprazole magnesium) .... Take 1 tablet by mouth once a day 10)  Advair Diskus 100-50 Mcg/dose Misc (Fluticasone-salmeterol) .Marland Kitchen.. 1 puff twice daily 11)  Ventolin Hfa 108 (90 Base) Mcg/act Aers (Albuterol sulfate) .... 2 puffs every 6-8 hours as needed 12)  Duoneb 0.5-2.5 (3)  Mg/30ml Soln (Ipratropium-albuterol) .... Three times a day prn 13)  Lovastatin 40 Mg Tabs (Lovastatin) .... 2 tablets at bedtime 14)  Flonase 50 Mcg/act Susp (Fluticasone propionate) .... 2 puffs per nostril daily as needed 15)  Zyrtec Hives Relief 10 Mg Tabs (Cetirizine hcl) .... Take 1 tablet by mouth once a day 16)  Meloxicam 15 Mg Tabs (Meloxicam) .... Take 1 tablet by mouth once a day 17)  Cyclobenzaprine Hcl 10 Mg Tabs (Cyclobenzaprine hcl) .... Take 1 tab by mouth at bedtime  Patient Instructions: 1)  Please schedule a follow-up appointment in 3.5 months. 2)  It is important that you exercise regularly at least 20 minutes 5 times a week. If you develop chest pain, have severe difficulty breathing, or feel very tired , stop exercising immediately and seek medical attention. 3)  You need to lose weight. Consider a lower calorie  diet and regular exercise.  4)  BMP prior to visit, ICD-9: 5)  Hepatic Panel prior to visit, ICD-9:  fasting as soon as possible 6)  Lipid Panel prior to visit, ICD-9: 7)  HbgA1C prior to visit, ICD-9: Prescriptions: AMLODIPINE BESYLATE 10 MG TABS (AMLODIPINE BESYLATE) Take 1 tablet by mouth once a day  #30 x 5   Entered by:   Everitt Amber LPN   Authorized by:   Syliva Overman MD   Signed by:   Everitt Amber LPN on 16/04/9603   Method used:   Electronically to        [facility] [phone]* (retail)       [address]       [place], [place]  [address]       Ph: [phone] or [phone]       Fax: [phone]   RxID:   [id number]

## 2010-08-24 NOTE — Letter (Signed)
Summary: LABS  LABS   Imported By: Lind Guest 04/13/2010 09:20:35  _____________________________________________________________________  External Attachment:    Type:   Image     Comment:   External Document

## 2010-08-24 NOTE — Letter (Signed)
Summary: Letter  Letter   Imported By: Lind Guest 05/09/2010 14:33:41  _____________________________________________________________________  External Attachment:    Type:   Image     Comment:   External Document

## 2010-08-24 NOTE — Letter (Signed)
Summary: Out of Work  [facility]  [address]   [place], [place] [address]   Phone: [phone]  Fax: [phone]    August 24, 2009   Employee:  [name]    To Whom It May Concern:   For Medical reasons, please excuse the above named employee from work for the following dates:  Start:   08/23/2009  End:   08/24/2009 Without restricitons  If you need additional information, please feel free to contact our office.         Sincerely,    Milus Mallick. Lodema Hong, M.D.

## 2010-08-24 NOTE — Assessment & Plan Note (Signed)
Summary: office visit   Vital Signs:  Patient profile:   70 year old female Menstrual status:  hysterectomy Height:      67 inches Weight:      220 pounds BMI:     34.58 O2 Sat:      93 % Pulse rate:   103 / minute Pulse rhythm:   regular Resp:     16 per minute BP sitting:   120 / 80  (left arm)  Vitals Entered By: Everitt Amber LPN (August 03, 2010 4:08 PM)  Nutrition Counseling: Patient's BMI is greater than 25 and therefore counseled on weight management options. CC: Follow up chronic problems, sinus draiange, coughing up some thick yellow phlegm    CC:  Follow up chronic problems, sinus draiange, and coughing up some thick yellow phlegm .  History of Present Illness: 1 week h/o increased head and chest congestion, cough, green sputum and yellow nasal drainage with chills and malaise. Reports  that she had been doing well prior to this. Denies recent fever or chills.  Denies chest pain, palpitations, PND, orthopnea or leg swelling. Denies abdominal pain, nausea, vomitting, diarrhea or constipation. Denies change in bowel movements or bloody stool. Denies dysuria , frequency, incontinence or hesitancy. Denies  joint pain, swelling, or reduced mobility. Denies headaches, vertigo, seizures. Denies depression, anxiety or insomnia. Denies  rash, lesions, or itch.     Current Medications (verified): 1)  Klor-Con M20 20 Meq  Cr-Tabs (Potassium Chloride Crys Cr) .... Take One Tab By Mouth Once Daily 2)  Geritol Complete   Tabs (Iron-Vitamins) .... Take One Tab By Mouth Once Daily 3)  Hydrochlorothiazide 25 Mg  Tabs (Hydrochlorothiazide) .... Take 1 Tablet By Mouth Once A Day 4)  Amlodipine Besylate 10 Mg Tabs (Amlodipine Besylate) .... Take 1 Tablet By Mouth Once A Day 5)  Accu-Chek Compact Test Drum  Strp (Glucose Blood) .... Uad 6)  Accu-Chek Multiclix Lancets  Misc (Lancets) .... Uad 7)  Bayer Aspirin 325 Mg Tabs (Aspirin) .... Take 1 Tablet By Mouth Once A Day 8)   Duoneb 0.5-2.5 (3) Mg/47ml Soln (Ipratropium-Albuterol) .... Three Times A Day Prn 9)  Lovastatin 40 Mg Tabs (Lovastatin) .... 2 Tablets At Bedtime 10)  Cyclobenzaprine Hcl 10 Mg Tabs (Cyclobenzaprine Hcl) .... Take 1 Tab By Mouth At Bedtime As Needed 11)  Prilosec 20 Mg Cpdr (Omeprazole) .... Take 1 Capsule By Mouth Once A Day As Needed 12)  Aloe Vera 5000 Mg Caps (Aloe Vera) .... One Cap By Mouth Once Daily 13)  Relacore Max Strength .... One Cap By Mouth Once Daily 14)  Vitamin D 1000 Unit Tabs (Cholecalciferol) .... One Cap By Mouth Once Daily 15)  Vitamin B-12 1000 Mcg Tabs (Cyanocobalamin) .... One Tab By Mouth Once Daily 16)  Respi Care Vitamin .... Take 1 Tablet By Mouth Once A Day 17)  Meloxicam 15 Mg Tabs (Meloxicam) .... Take 1 Tablet By Mouth Once A Day  Allergies (verified): No Known Drug Allergies  Review of Systems General:  Complains of chills and malaise; denies fatigue. Eyes:  Denies discharge, eye pain, and red eye. ENT:  Complains of hoarseness, nasal congestion, postnasal drainage, and sinus pressure. Resp:  Complains of cough, sputum productive, and wheezing. Endo:  Denies cold intolerance, excessive hunger, excessive thirst, and excessive urination. Heme:  Denies abnormal bruising and bleeding. Allergy:  Complains of seasonal allergies.  Physical Exam  General:  overweight elderly female, ill appearing,,in no acute distress; alert,appropriate and cooperative throughout examination  HEENT: No facial asymmetry,  EOMI, frontal and maxillary  sinus tenderness, TM's Clear, oropharynx  pink and moist.   Chest: decreased air entry, bilateral crackles and wheezes CVS: S1, S2, No murmurs, No S3.   Abd: Soft, Nontender.  MS: Adequate ROM spine, hips, shoulders and knees.  Ext: No edema.   CNS: CN 2-12 intact, power tone and sensation normal throughout.   Skin: Intact, no visible lesions or rashes.  Psych: Good eye contact, normal affect.  Memory intact, not anxious or  depressed appearing.    Impression & Recommendations:  Problem # 1:  ACUTE SINUSITIS, UNSPECIFIED (ICD-461.9) Assessment Comment Only  The following medications were removed from the medication list:    Septra Ds 800-160 Mg Tabs (Sulfamethoxazole-trimethoprim) .Marland Kitchen... Take 1 tablet by mouth two times a day Her updated medication list for this problem includes:    Penicillin V Potassium 500 Mg Tabs (Penicillin v potassium) .Marland Kitchen... Take 1 tablet by mouth three times a day    Tessalon Perles 100 Mg Caps (Benzonatate) .Marland Kitchen... Take 1 capsule by mouth three times a day  Orders: Depo- Medrol 80mg  (J1040) Rocephin  250mg  (Z6109) Admin of Therapeutic Inj  intramuscular or subcutaneous (60454) Medicare Electronic Prescription (U9811)  Problem # 2:  ACUTE BRONCHITIS (ICD-466.0) Assessment: Comment Only  The following medications were removed from the medication list:    Advair Diskus 100-50 Mcg/dose Misc (Fluticasone-salmeterol) .Marland Kitchen... 1 puff twice daily    Ventolin Hfa 108 (90 Base) Mcg/act Aers (Albuterol sulfate) .Marland Kitchen... 2 puffs every 6-8 hours as needed    Septra Ds 800-160 Mg Tabs (Sulfamethoxazole-trimethoprim) .Marland Kitchen... Take 1 tablet by mouth two times a day Her updated medication list for this problem includes:    Duoneb 0.5-2.5 (3) Mg/66ml Soln (Ipratropium-albuterol) .Marland Kitchen... Three times a day prn    Penicillin V Potassium 500 Mg Tabs (Penicillin v potassium) .Marland Kitchen... Take 1 tablet by mouth three times a day    Tessalon Perles 100 Mg Caps (Benzonatate) .Marland Kitchen... Take 1 capsule by mouth three times a day    Proventil Hfa 108 (90 Base) Mcg/act Aers (Albuterol sulfate) .Marland Kitchen... 2 puffs every 6 to 8 hours as needed for wheezing  Orders: Medicare Electronic Prescription [phone])  Problem # 3:  HYPERLIPIDEMIA (ICD-272.4) Assessment: Deteriorated  Her updated medication list for this problem includes:    Lovastatin 40 Mg Tabs (Lovastatin) .Marland Kitchen... 2 tablets at bedtime Low fat dietdiscussed and encouraged  Labs  Reviewed: SGOT: 14 (07/19/2010)   SGPT: 12 (07/19/2010)   HDL:52 (07/19/2010), 55 (02/21/2010)  LDL:165 (07/19/2010), 109 (02/21/2010)  Chol:235 (07/19/2010), 194 (02/21/2010)  Trig:92 (07/19/2010), 148 (02/21/2010)  Problem # 4:  DIABETES MELLITUS, TYPE II (ICD-250.00) Assessment: Comment Only  Her updated medication list for this problem includes:    Bayer Aspirin 325 Mg Tabs (Aspirin) .Marland Kitchen... Take 1 tablet by mouth once a day  Labs Reviewed: Creat: 0.63 (07/19/2010)    Reviewed HgBA1c results: 6.0 (07/19/2010)  5.5 (02/21/2010) Pt advised to reduce carbohydrate intake, espescially sweets, and to start regular physical activity, at least 30 minutes 5 days weekly, to enable weight loss, and reduce the risk of becoming diabetic   Complete Medication List: 1)  Klor-con M20 20 Meq Cr-tabs (Potassium chloride crys cr) .... Take one tab by mouth once daily 2)  Geritol Complete Tabs (Iron-vitamins) .... Take one tab by mouth once daily 3)  Hydrochlorothiazide 25 Mg Tabs (Hydrochlorothiazide) .... Take 1 tablet by mouth once a day 4)  Amlodipine Besylate 10 Mg Tabs (Amlodipine besylate) .Marland KitchenMarland KitchenMarland Kitchen  Take 1 tablet by mouth once a day 5)  Accu-chek Compact Test Drum Strp (Glucose blood) .... Uad 6)  Accu-chek Multiclix Lancets Misc (Lancets) .... Uad 7)  Bayer Aspirin 325 Mg Tabs (Aspirin) .... Take 1 tablet by mouth once a day 8)  Duoneb 0.5-2.5 (3) Mg/105ml Soln (Ipratropium-albuterol) .... Three times a day prn 9)  Lovastatin 40 Mg Tabs (Lovastatin) .... 2 tablets at bedtime 10)  Cyclobenzaprine Hcl 10 Mg Tabs (Cyclobenzaprine hcl) .... Take 1 tab by mouth at bedtime as needed 11)  Prilosec 20 Mg Cpdr (Omeprazole) .... Take 1 capsule by mouth once a day as needed 12)  Aloe Vera 5000 Mg Caps (Aloe vera) .... One cap by mouth once daily 13)  Relacore Max Strength  .... One cap by mouth once daily 14)  Vitamin D 1000 Unit Tabs (Cholecalciferol) .... One cap by mouth once daily 15)  Vitamin B-12 1000 Mcg  Tabs (Cyanocobalamin) .... One tab by mouth once daily 16)  Respi Care Vitamin  .... Take 1 tablet by mouth once a day 17)  Meloxicam 15 Mg Tabs (Meloxicam) .... Take 1 tablet by mouth once a day 18)  Penicillin V Potassium 500 Mg Tabs (Penicillin v potassium) .... Take 1 tablet by mouth three times a day 19)  Tessalon Perles 100 Mg Caps (Benzonatate) .... Take 1 capsule by mouth three times a day 20)  Proventil Hfa 108 (90 Base) Mcg/act Aers (Albuterol sulfate) .... 2 puffs every 6 to 8 hours as needed for wheezing  Patient Instructions: 1)  Please schedule a follow-up appointment in 3 months. 2)  You are being treated for sinusitis and bronchitis. 3)  Meds are sent in and you will get injections in the office today  Prescriptions: PROVENTIL HFA 108 (90 BASE) MCG/ACT AERS (ALBUTEROL SULFATE) 2 puffs every 6 to 8 hours as needed for wheezing  #1 x 3   Entered and Authorized by:   Syliva Overman MD   Signed by:   Syliva Overman MD on 08/03/2010   Method used:   Electronically to        [facility] [phone]* (retail)       [address]       [place], [place]  [address]       Ph: [phone] or [phone]       Fax: [phone]   RxID:   [id number] TESSALON PERLES 100 MG CAPS (BENZONATATE) Take 1 capsule by mouth three times a day  #30 x 0   Entered and Authorized by:   Syliva Overman MD   Signed by:   Syliva Overman MD on 08/03/2010   Method used:   Electronically to        [facility] [phone]* (retail)       [address]       [place], [place]  [address]       Ph: [phone] or [phone]       Fax: [phone]   RxID:   [id number] PREDNISONE (PAK) 5 MG TABS (PREDNISONE) Use as directed  #21 x 0   Entered and Authorized by:   Syliva Overman MD   Signed by:   Syliva Overman MD on 08/03/2010   Method used:   Electronically to        [facility] [phone]* (retail)       [address]  [place], [place]  [address]       Ph: [phone] or [phone]       Fax: [phone]   RxID:   [id number] PENICILLIN V POTASSIUM 500 MG TABS (PENICILLIN V POTASSIUM) Take 1 tablet by mouth three times a day  #30 x 0   Entered and Authorized by:   Syliva Overman MD   Signed by:   Syliva Overman MD on 08/03/2010   Method used:   Electronically to        [facility] [phone]* (retail)       [address]       [place], [place]  [address]       Ph: [phone] or [phone]       Fax: [phone]   RxID:   [id number]    Medication Administration  Injection # 1:    Medication: Depo- Medrol 80mg     Diagnosis: ACUTE SINUSITIS, UNSPECIFIED (ICD-461.9)    Route: IM    Site: RUOQ gluteus    Exp Date: 01/2011    Lot #: Gunnar Bulla    Mfr: Pharmacia    Comments: 80mg  given     Patient tolerated injection without complications    Given by: Everitt Amber LPN (August 03, 2010 5:05 PM)  Injection # 2:    Medication: Rocephin  250mg     Diagnosis: ACUTE SINUSITIS, UNSPECIFIED (ICD-461.9)    Route: IM    Site: LUOQ gluteus    Exp Date: 11/2012    Lot #: [id number]    Mfr: [email or web address]    Comments: 500mg  given     Patient tolerated injection without complications    Given by: Everitt Amber LPN (August 03, 2010 5:06 PM)  Orders Added: 1)  Est. Patient Level IV [16967] 2)  Depo- Medrol 80mg  [J1040] 3)  Rocephin  250mg  [J0696] 4)  Admin of Therapeutic Inj  intramuscular or subcutaneous [96372] 5)  Medicare Electronic Prescription [G8553]     Medication Administration  Injection # 1:    Medication: Depo- Medrol 80mg     Diagnosis: ACUTE SINUSITIS, UNSPECIFIED (ICD-461.9)    Route: IM    Site: RUOQ gluteus    Exp Date: 01/2011    Lot #: Gunnar Bulla    Mfr: Pharmacia    Comments: 80mg  given     Patient tolerated injection without complications    Given by: Everitt Amber LPN (August 03, 2010 5:05 PM)  Injection # 2:    Medication: Rocephin  250mg     Diagnosis: ACUTE  SINUSITIS, UNSPECIFIED (ICD-461.9)    Route: IM    Site: LUOQ gluteus    Exp Date: 11/2012    Lot #: [id number]    Mfr: [email or web address]    Comments: 500mg  given     Patient tolerated injection without complications    Given by: Everitt Amber LPN (August 03, 2010 5:06 PM)  Orders Added: 1)  Est. Patient Level IV [17510] 2)  Depo- Medrol 80mg  [J1040] 3)  Rocephin  250mg  [J0696] 4)  Admin of Therapeutic Inj  intramuscular or subcutaneous [96372] 5)  Medicare Electronic Prescription [[id number]]

## 2010-08-24 NOTE — Assessment & Plan Note (Signed)
Summary: OFFICE VISIT   Vital Signs:  Patient profile:   70 year old female Menstrual status:  hysterectomy Height:      67 inches Weight:      202.75 pounds BMI:     31.87 O2 Sat:      96 % Pulse rate:   77 / minute Pulse rhythm:   regular Resp:     16 per minute BP sitting:   124 / 74 Cuff size:   large  Vitals Entered By: Everitt Amber (August 22, 2009 10:53 AM)  Nutrition Counseling: Patient's BMI is greater than 25 and therefore counseled on weight management options. CC: nasal pressure over eyes and forehead, yellowish nasal congestion, left upper leg feels like something is biting her and she has to scratch it all the time Is Patient Diabetic? Yes   CC:  nasal pressure over eyes and forehead, yellowish nasal congestion, and left upper leg feels like something is biting her and she has to scratch it all the time.  History of Present Illness: Reports  that was doing fairly well up until the last 1 to 2 weeks. . she reports sinus pressure with  nasal congestion , ear pain but no sore throat. Denies chest congestion, she has a chronic cough, no sputum at this time Denies chest pain, palpitations, PND, orthopnea or leg swelling. Denies abdominal pain, nausea, vomitting, diarrhea or constipation. Denies change in bowel movements or bloody stool. Denies dysuria , frequency, incontinence or hesitancy. reports inc back pain radiating down leg with reduced mobility i the spine Denies headaches, vertigo, seizures. Denies depression, anxiety or insomnia. Denies  rash, lesions, or itch.     Current Medications (verified): 1)  Klor-Con M20 20 Meq  Cr-Tabs (Potassium Chloride Crys Cr) .... Take One Tab By Mouth Once Daily 2)  Geritol Complete   Tabs (Iron-Vitamins) .... Take One Tab By Mouth Once Daily 3)  Hydrochlorothiazide 25 Mg  Tabs (Hydrochlorothiazide) .... Take 1 Tablet By Mouth Once A Day 4)  Amlodipine Besylate 10 Mg Tabs (Amlodipine Besylate) .... Take 1 Tablet By  Mouth Once A Day 5)  Accu-Chek Compact Test Drum  Strp (Glucose Blood) .... Uad 6)  Accu-Chek Multiclix Lancets  Misc (Lancets) .... Uad 7)  Metformin Hcl 500 Mg Tabs (Metformin Hcl) .... One Tab By Mouth Bid 8)  Bayer Aspirin 325 Mg Tabs (Aspirin) .... Take 1 Tablet By Mouth Once A Day 9)  Prilosec Otc 20 Mg Tbec (Omeprazole Magnesium) .... Take 1 Tablet By Mouth Once A Day 10)  Advair Diskus 100-50 Mcg/dose Misc (Fluticasone-Salmeterol) .Marland Kitchen.. 1 Puff Twice Daily 11)  Ventolin Hfa 108 (90 Base) Mcg/act Aers (Albuterol Sulfate) .... 2 Puffs Every 6-8 Hours As Needed 12)  Duoneb 0.5-2.5 (3) Mg/39ml Soln (Ipratropium-Albuterol) .... Three Times A Day Prn 13)  Lovastatin 40 Mg Tabs (Lovastatin) .... 2 Tablets At Bedtime 14)  Flonase 50 Mcg/act Susp (Fluticasone Propionate) .... 2 Puffs Per Nostril Daily As Needed  Allergies (verified): No Known Drug Allergies  Review of Systems      See HPI Eyes:  Denies discharge and red eye. ENT:  Complains of hoarseness, nasal congestion, postnasal drainage, and sinus pressure; frontal and maxillary pressure with post nasal drainage. 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 Endo:  Denies cold intolerance,  excessive hunger, excessive thirst, excessive urination, heat intolerance, polyuria, and weight change. Heme:  Denies abnormal bruising and bleeding. Allergy:  Complains of seasonal allergies and sneezing.  Physical Exam  General:  alert, well-hydrated, and overweight-appearing.   HEENT: No facial asymmetry,  EOMI, positive sinus tenderness, TM's Clear, oropharynx  pink and moist.   Chest: decreased air entry, scattered crackles, no wheezes CVS: S1, S2, No murmurs, No S3.   Abd: Soft,mild epigastric tenderness  MS: decreased  ROM spine, hips, shoulders and knees.  Ext: no edema CNS: CN 2-12  intact, power tone and sensation normal throughout.   Skin: Intact, no visible lesions or rashes.  Psych: Good eye contact, normal affect.  Memory intact, not anxious or depressed appearing.    Impression & Recommendations:  Problem # 1:  BACK PAIN WITH RADICULOPATHY (ICD-729.2) Assessment Deteriorated  Orders: Depo- Medrol 80mg  (J1040) Ketorolac-Toradol 15mg  (Y8657) Admin of Therapeutic Inj  intramuscular or subcutaneous (84696)  Problem # 2:  ACUTE SINUSITIS, UNSPECIFIED (ICD-461.9) Assessment: Comment Only  The following medications were removed from the medication list:    Penicillin V Potassium 500 Mg Tabs (Penicillin v potassium) .Marland Kitchen... Take 1 tablet by mouth three times a day    Tessalon Perles 100 Mg Caps (Benzonatate) .Marland Kitchen... Take 1 tablet by mouth three times a day Her updated medication list for this problem includes:    Flonase 50 Mcg/act Susp (Fluticasone propionate) .Marland Kitchen... 2 puffs per nostril daily as needed    Doxycycline Hyclate 100 Mg Caps (Doxycycline hyclate) .Marland Kitchen... Take 1 capsule by mouth two times a day  Orders: Rocephin  250mg  (E9528)  Problem # 3:  DIABETES MELLITUS, TYPE II (ICD-250.00) Assessment: Improved  Her updated medication list for this problem includes:    Metformin Hcl 500 Mg Tabs (Metformin hcl) ..... One tab by mouth bid    Bayer Aspirin 325 Mg Tabs (Aspirin) .Marland Kitchen... Take 1 tablet by mouth once a day  Orders: Glucose, (CBG) (82962) Hemoglobin A1C (83036)   5.9 T- Hemoglobin A1C (41324-40102) T-Urine Microalbumin w/creat. ratio ([phone]) Glucose, (CBG) [phone])  Labs Reviewed: Creat: 0.89 (04/14/2009)    Reviewed HgBA1c results: 6.2 (03/17/2009)  5.6 (12/15/2008)  Problem # 4:  HYPERLIPIDEMIA (ICD-272.4) Assessment: Comment Only  Her updated medication list for this problem includes:    Lovastatin 40 Mg Tabs (Lovastatin) .Marland Kitchen... 2 tablets at bedtime  Orders: T-Hepatic Function [phone]) T-Lipid Profile [phone])  Labs  Reviewed: SGOT: 15 (02/17/2009)   SGPT: 13 (02/17/2009)   HDL:51 (02/17/2009), 49 (11/15/2008)  LDL:67 (02/17/2009), 122 (01/60/1093)  Chol:136 (02/17/2009), 189 (11/15/2008)  Trig:90 (02/17/2009), 92 (11/15/2008)  Complete Medication List: 1)  Klor-con M20 20 Meq Cr-tabs (Potassium chloride crys cr) .... Take one tab by mouth once daily 2)  Geritol Complete Tabs (Iron-vitamins) .... Take one tab by mouth once daily 3)  Hydrochlorothiazide 25 Mg Tabs (Hydrochlorothiazide) .... Take 1 tablet by mouth once a day 4)  Amlodipine Besylate 10 Mg Tabs (Amlodipine besylate) .... Take 1 tablet by mouth once a day 5)  Accu-chek Compact Test Drum Strp (Glucose blood) .... Uad 6)  Accu-chek Multiclix Lancets Misc (Lancets) .... Uad 7)  Metformin Hcl 500 Mg Tabs (Metformin hcl) .... One tab by mouth bid 8)  Bayer Aspirin 325 Mg Tabs (Aspirin) .... Take 1 tablet by mouth once a day 9)  Prilosec Otc 20 Mg Tbec (Omeprazole magnesium) .... Take 1 tablet by mouth once a day 10)  Advair Diskus 100-50 Mcg/dose Misc (Fluticasone-salmeterol) .Marland Kitchen.. 1 puff twice daily 11)  Ventolin Hfa 108 (90 Base) Mcg/act Aers (Albuterol sulfate) .... 2 puffs every 6-8 hours as needed 12)  Duoneb 0.5-2.5 (3) Mg/72ml Soln (Ipratropium-albuterol) .... Three times a day prn 13)  Lovastatin 40 Mg Tabs (Lovastatin) .... 2 tablets at bedtime 14)  Flonase 50 Mcg/act Susp (Fluticasone propionate) .... 2 puffs per nostril daily as needed 15)  Doxycycline Hyclate 100 Mg Caps (Doxycycline hyclate) .... Take 1 capsule by mouth two times a day 16)  Zyrtec Hives Relief 10 Mg Tabs (Cetirizine hcl) .... Take 1 tablet by mouth once a day 17)  Meloxicam 15 Mg Tabs (Meloxicam) .... Take 1 tablet by mouth once a day 18)  Cyclobenzaprine Hcl 10 Mg Tabs (Cyclobenzaprine hcl) .... Take 1 tab by mouth at bedtime  Other Orders: T-Basic Metabolic Panel [phone])  Patient Instructions: 1)  Please schedule a follow-up appointment in 3 months. 2)   Check your blood sugars regularly. If your readings are usually above250: or below 70 you should contact our office. 3)  You are beiing treated for sinusitis and back pain. 4)  You will get injectiions in the office and meds are being sent in also Prescriptions: CYCLOBENZAPRINE HCL 10 MG TABS (CYCLOBENZAPRINE HCL) Take 1 tab by mouth at bedtime  #30 x 3   Entered and Authorized by:   Syliva Overman MD   Signed by:   Syliva Overman MD on 08/22/2009   Method used:   Electronically to        [facility] [phone]* (retail)       [address]       [place], [place]  [address]       Ph: [phone] or [phone]       Fax: [phone]   RxID:   [id number] MELOXICAM 15 MG TABS (MELOXICAM) Take 1 tablet by mouth once a day  #30 x 3   Entered and Authorized by:   Syliva Overman MD   Signed by:   Syliva Overman MD on 08/22/2009   Method used:   Electronically to        [facility] [phone]* (retail)       [address]       [place], [place]  [address]       Ph: [phone] or [phone]       Fax: [phone]   RxID:   [id number] ZYRTEC HIVES RELIEF 10 MG TABS (CETIRIZINE HCL) Take 1 tablet by mouth once a day  #30 x 3   Entered and Authorized by:   Syliva Overman MD   Signed by:   Syliva Overman MD on 08/22/2009   Method used:   Electronically to        [facility] [phone]* (retail)       [address]       [place], [place]  [address]       Ph: [phone] or [phone]       Fax: [phone]   RxID:   [id number] DOXYCYCLINE HYCLATE 100 MG CAPS (DOXYCYCLINE HYCLATE) Take 1 capsule by mouth two times a day  #20 x 0   Entered and Authorized by:   Syliva Overman MD   Signed by:   Syliva Overman MD on 08/22/2009   Method used:   Electronically to        [facility] [phone]* (retail)  [address]       [place], [place]  [address]       Ph: [phone] or [phone]        Fax: [phone]   RxID:   [id number]    Medication Administration  Injection # 1:    Medication: Depo- Medrol 80mg     Diagnosis: BACK PAIN WITH RADICULOPATHY (ICD-729.2)    Route: IM    Site: RUOQ gluteus    Exp Date: 04/2010    Lot #: [id number]    Mfr: Pharmacia    Comments: 80 mg given     Patient tolerated injection without complications    Given by: Everitt Amber (August 22, 2009 11:37 AM)  Injection # 2:    Medication: Ketorolac-Toradol 15mg     Diagnosis: BACK PAIN WITH RADICULOPATHY (ICD-729.2)    Route: IM    Site: LUOQ gluteus    Exp Date: 02/2011    Lot #: [id number]    Mfr: [email or web address]    Comments: 60 mg given     Patient tolerated injection without complications    Given by: Everitt Amber (August 22, 2009 11:37 AM)  Injection # 3:    Medication: Rocephin  250mg     Diagnosis: ACUTE SINUSITIS, UNSPECIFIED (ICD-461.9)    Route: IM    Site: RUOQ gluteus    Exp Date: 10/2011    Lot #: [id number]    Mfr: [email or web address]    Comments: 500 mg given     Patient tolerated injection without complications    Given by: Everitt Amber (August 22, 2009 11:38 AM)  Orders Added: 1)  Est. Patient Level IV [25956] 2)  Glucose, (CBG) [82962] 3)  Hemoglobin A1C [83036] 4)  T-Basic Metabolic Panel [80048-22910] 5)  T-Hepatic Function [80076-22960] 6)  T-Lipid Profile [80061-22930] 7)  T- Hemoglobin A1C [83036-23375] 8)  T-Urine Microalbumin w/creat. ratio [82043-[address]] 9)  Depo- Medrol 80mg  [J1040] 10)  Ketorolac-Toradol 15mg  [J1885] 11)  Rocephin  250mg  [J0696] 12)  Admin of Therapeutic Inj  intramuscular or subcutaneous [96372] 13)  Glucose, (CBG) [82962]  Laboratory Results   Blood Tests   Date/Time Received: August 22, 2009  Date/Time Reported: August 22, 2009   Glucose (fasting): 109 mg/dL   (Normal Range: 38-756)

## 2010-08-24 NOTE — Letter (Signed)
Summary: ATT: PHARMACY  ATT: PHARMACY   Imported By: Lind Guest 05/09/2010 14:39:56  _____________________________________________________________________  External Attachment:    Type:   Image     Comment:   External Document

## 2010-08-24 NOTE — Letter (Signed)
Summary: [facility] /  release  [facility] /  release   Imported By: Lind Guest 05/17/2010 14:09:48  _____________________________________________________________________  External Attachment:    Type:   Image     Comment:   External Document

## 2010-10-17 ENCOUNTER — Telehealth: Payer: Self-pay | Admitting: Family Medicine

## 2010-10-18 NOTE — Telephone Encounter (Signed)
Dr. Katrinka Blazing told her awhile back that she had a high hernia in her abdomen but it looked ok. She thinks it may be getting bigger because sometimes it feels like its going to cut her breath off sometimes. Wants to come in and have you check it. [name] advised no appts. What do I need to tell her?

## 2010-10-18 NOTE — Telephone Encounter (Signed)
Patient aware referral will be made for her to have surgical eval

## 2010-10-18 NOTE — Telephone Encounter (Signed)
I suggest a surgical eval for this and will put in a referral to dr Lovell Sheehan

## 2010-10-24 ENCOUNTER — Other Ambulatory Visit: Payer: Self-pay | Admitting: Family Medicine

## 2010-10-29 LAB — CBC
Hemoglobin: 12.6 g/dL (ref 12.0–15.0)
MCHC: 34.9 g/dL (ref 30.0–36.0)
MCV: 87.9 fL (ref 78.0–100.0)
RDW: 14.5 % (ref 11.5–15.5)

## 2010-10-29 LAB — BASIC METABOLIC PANEL
CO2: 27 mEq/L (ref 19–32)
Calcium: 9.2 mg/dL (ref 8.4–10.5)
Chloride: 99 mEq/L (ref 96–112)
Glucose, Bld: 126 mg/dL — ABNORMAL HIGH (ref 70–99)
Sodium: 134 mEq/L — ABNORMAL LOW (ref 135–145)

## 2010-10-29 LAB — DIFFERENTIAL
Basophils Absolute: 0.1 10*3/uL (ref 0.0–0.1)
Basophils Relative: 1 % (ref 0–1)
Eosinophils Absolute: 0.2 10*3/uL (ref 0.0–0.7)
Eosinophils Relative: 2 % (ref 0–5)
Monocytes Absolute: 0.8 10*3/uL (ref 0.1–1.0)
Monocytes Relative: 8 % (ref 3–12)
Neutro Abs: 5 10*3/uL (ref 1.7–7.7)

## 2010-10-29 LAB — GLUCOSE, CAPILLARY: Glucose-Capillary: 99 mg/dL (ref 70–99)

## 2010-11-01 ENCOUNTER — Encounter: Payer: Self-pay | Admitting: Family Medicine

## 2010-11-01 LAB — URINALYSIS, ROUTINE W REFLEX MICROSCOPIC
Glucose, UA: NEGATIVE mg/dL
Nitrite: NEGATIVE
Protein, ur: NEGATIVE mg/dL
pH: 5.5 (ref 5.0–8.0)

## 2010-11-02 ENCOUNTER — Encounter: Payer: Self-pay | Admitting: Family Medicine

## 2010-11-03 ENCOUNTER — Encounter: Payer: Self-pay | Admitting: Family Medicine

## 2010-11-07 ENCOUNTER — Ambulatory Visit (HOSPITAL_COMMUNITY)
Admission: RE | Admit: 2010-11-07 | Discharge: 2010-11-07 | Disposition: A | Discharge status: Home / Self Care | Payer: Medicare Other | Source: Ambulatory Visit | Attending: General Surgery | Admitting: General Surgery

## 2010-11-07 DIAGNOSIS — K219 Gastro-esophageal reflux disease without esophagitis: Secondary | ICD-10-CM | POA: Insufficient documentation

## 2010-11-07 DIAGNOSIS — K449 Diaphragmatic hernia without obstruction or gangrene: Secondary | ICD-10-CM | POA: Insufficient documentation

## 2010-11-07 HISTORY — PX: ESOPHAGOGASTRODUODENOSCOPY: SHX1529

## 2010-11-10 NOTE — Op Note (Signed)
  [name], SCHLOTTERBECK                ACCOUNT NO.:  [id number]  MEDICAL RECORD NO.:  [id number]           PATIENT TYPE:  O  LOCATION:  DAYP                          FACILITY:  [facility]  PHYSICIAN:  Dalia Heading, M.D.  DATE OF BIRTH:  1941-05-09  DATE OF PROCEDURE:  11/07/2010 DATE OF DISCHARGE:                              OPERATIVE REPORT   PREOPERATIVE DIAGNOSIS:  Gastroesophageal reflux disease.  POSTOPERATIVE DIAGNOSIS:  Gastroesophageal reflux disease, small sliding hiatal hernia.  PROCEDURE:  Esophagogastroduodenoscopy with biopsy.  SURGEON:  Dalia Heading, MD  ANESTHESIA:  Demerol 50 mg IV, Versed 2 mg IV.  INDICATIONS:  The patient is a 70 year old black female who presents with GERD.  The risks and benefits of procedure were fully explained to the patient, gave informed consent.  PROCEDURE NOTE:  The patient was placed in the left lateral decubitus position after placement of nasal cannula, pulse oximetry, noninvasive blood pressure monitoring.  Hurricaine spray was used on the oropharynx. Demerol and Versed were used of the procedure for anesthesia.  The endoscope was advanced down to the second portion of the duodenum without difficulty.  First and second portions of the duodenum wounds were within normal limits.  No abnormal lesions were noted.  The pylorus was noted to be patent.  A prepyloric biopsy was taken for a CLO test. The antrum and body of the stomach were easily distensible and noted to be within normal limits.  On retroflexion of the endoscope, a small sliding hiatal hernia was seen.  There was no evidence of Barrett esophagitis.  The Z-line was noted to be at 39 cm from the teeth.  The esophagus was within normal limits.  No abnormal lesions were noted. All air was evacuated from the stomach and esophagus prior to removal of the endoscope.  The patient tolerated the procedure well, was transferred back to day surgery in stable condition.   Complications none.  SPECIMEN:  CLO test biopsy.  RECOMMENDATIONS:  The patient should continue Prilosec and Gas-X as needed.  CLO test results are pending.     Dalia Heading, M.D.     MAJ/MEDQ  D:  11/07/2010  T:  11/07/2010  Job:  [id number]  cc:   Milus Mallick. Lodema Hong, M.D. Fax: [phone]  Electronically Signed by Franky Macho M.D. on 11/10/2010 09:19:30 AM

## 2010-11-17 ENCOUNTER — Encounter: Payer: Self-pay | Admitting: Family Medicine

## 2010-11-21 ENCOUNTER — Encounter: Payer: Self-pay | Admitting: Family Medicine

## 2010-11-23 ENCOUNTER — Encounter: Payer: Self-pay | Admitting: Family Medicine

## 2010-11-23 ENCOUNTER — Telehealth: Payer: Self-pay | Admitting: Family Medicine

## 2010-11-23 ENCOUNTER — Ambulatory Visit (INDEPENDENT_AMBULATORY_CARE_PROVIDER_SITE_OTHER): Payer: Medicare Other | Admitting: Family Medicine

## 2010-11-23 ENCOUNTER — Other Ambulatory Visit: Payer: Self-pay | Admitting: Family Medicine

## 2010-11-23 VITALS — BP 134/80 | HR 87 | Resp 16 | Ht 66.0 in | Wt 228.8 lb

## 2010-11-23 DIAGNOSIS — E559 Vitamin D deficiency, unspecified: Secondary | ICD-10-CM

## 2010-11-23 DIAGNOSIS — F17200 Nicotine dependence, unspecified, uncomplicated: Secondary | ICD-10-CM

## 2010-11-23 DIAGNOSIS — E119 Type 2 diabetes mellitus without complications: Secondary | ICD-10-CM

## 2010-11-23 DIAGNOSIS — E785 Hyperlipidemia, unspecified: Secondary | ICD-10-CM

## 2010-11-23 DIAGNOSIS — Z139 Encounter for screening, unspecified: Secondary | ICD-10-CM

## 2010-11-23 DIAGNOSIS — J449 Chronic obstructive pulmonary disease, unspecified: Secondary | ICD-10-CM

## 2010-11-23 DIAGNOSIS — J309 Allergic rhinitis, unspecified: Secondary | ICD-10-CM

## 2010-11-23 DIAGNOSIS — I10 Essential (primary) hypertension: Secondary | ICD-10-CM

## 2010-11-23 DIAGNOSIS — R7301 Impaired fasting glucose: Secondary | ICD-10-CM

## 2010-11-23 DIAGNOSIS — J4489 Other specified chronic obstructive pulmonary disease: Secondary | ICD-10-CM

## 2010-11-23 MED ORDER — FLUTICASONE-SALMETEROL 100-50 MCG/DOSE IN AEPB: 1.0000 | INHALATION_SPRAY | Freq: Two times a day (BID) | RESPIRATORY_TRACT | Status: DC

## 2010-11-23 MED ORDER — VITAMIN D3 1.25 MG (50000 UT) PO CAPS: 50000.0000 [IU] | ORAL_CAPSULE | ORAL | Status: DC

## 2010-11-23 MED ORDER — ALBUTEROL SULFATE HFA 108 (90 BASE) MCG/ACT IN AERS: 2.0000 | INHALATION_SPRAY | Freq: Four times a day (QID) | RESPIRATORY_TRACT | Status: DC | PRN

## 2010-11-23 MED ORDER — PREDNISONE (PAK) 5 MG PO TABS: 5.0000 mg | ORAL_TABLET | ORAL | Status: DC

## 2010-11-23 MED ORDER — FLUTICASONE PROPIONATE 50 MCG/ACT NA SUSP: 1.0000 | Freq: Every day | NASAL | Status: DC

## 2010-11-23 MED ORDER — IPRATROPIUM-ALBUTEROL 0.5-2.5 (3) MG/3ML IN SOLN: 3.0000 mL | Freq: Three times a day (TID) | RESPIRATORY_TRACT | Status: DC

## 2010-11-23 NOTE — Progress Notes (Signed)
  Subjective:    Patient ID: [name], female    DOB: 1941/02/01, 70 y.o.   MRN: [id number]  HPI 3 day h/o head congestion and clear runny nose, no fever, chills, sore throat or cough. Sneezing excessively, allergies uncontrolled. HYPERTENSION Disease Monitoring Blood pressure range-unknown Chest pain- no      Dyspnea- due primarily to wheezing Medications Compliance- good Lightheadedness- no   Edema- no   DIABETES Disease Monitoring Blood Sugar ranges-fasting gen under 120 Polyuria- no New Visual problems- no Medications Compliance- good Hypoglycemic symptoms- no   HYPERLIPIDEMIA Disease Monitoring See symptoms for Hypertension Medications Compliance- good RUQ pain- no  Muscle aches- no    Review of Systems Denies recent fever or chills. Denies  ear pain or sore throat. Denies chest congestion, productive cough or wheezing. Denies chest pains, palpitations, paroxysmal nocturnal dyspnea, orthopnea and leg swelling Denies abdominal pain, nausea, vomiting,diarrhea or constipation.  Denies rectal bleeding or change in bowel movement. Denies dysuria, frequency, hesitancy or incontinence. Denies joint pain, swelling and limitation in mobility. Denies headaches, seizure, numbness, or tingling. Denies depression, anxiety or insomnia. Denies skin break down or rash.        Objective:   Physical Exam Patient alert and oriented and in no Cardiopulmonary distress.  HEENT: No facial asymmetry, EOMI, no sinus tenderness, TM's clear, Oropharynx pink and moist.  Neck supple no adenopathy. Erythema and edema of nasal mucosa. Excessive sneezing in office Chest: Clear to auscultation bilaterally.Decreased air entry bilaterally  CVS: S1, S2 no murmurs, no S3.  ABD: Soft non tender. Bowel sounds normal.  Ext: No edema  MS: Adequate ROM spine, shoulders, hips and knees.  Skin: Intact, no ulcerations or rash noted.  Psych: Good eye contact, normal affect. Memory intact not  anxious or depressed appearing.  CNS: CN 2-12 intact, power, tone and sensation normal throughout.        Assessment & Plan:

## 2010-11-23 NOTE — Patient Instructions (Addendum)
F/u in 4 months  You will get an injection for allergies, and flonase spray is sent in.You will get a dose p[pack  Of prednisone   HBA1C , fasting lipid, cmp and EGFR  This week.  You need to take vit D once weekly for at least 6 months, the script is sent to your pharmacy  Advair is being sent in for your breathing  mammogeram due 07/01 or after , we will schedule

## 2010-11-24 MED ORDER — METHYLPREDNISOLONE ACETATE 80 MG/ML IJ SUSP
80.0000 mg | Freq: Once | INTRAMUSCULAR | Status: AC
  Administered 2010-11-24: 80 mg via INTRAMUSCULAR

## 2010-11-25 LAB — COMPLETE METABOLIC PANEL WITH GFR
Alkaline Phosphatase: 95 U/L (ref 39–117)
Creat: 0.75 mg/dL (ref 0.40–1.20)
GFR, Est African American: >60 mL/min (ref >60)
GFR, Est Non African American: >60 mL/min (ref >60)
Glucose, Bld: 101 mg/dL — ABNORMAL HIGH (ref 70–99)
Sodium: 140 mEq/L (ref 135–145)
Total Bilirubin: 0.5 mg/dL (ref 0.3–1.2)
Total Protein: 7.2 g/dL (ref 6.0–8.3)

## 2010-11-25 LAB — HEMOGLOBIN A1C
Hgb A1c MFr Bld: 6 % — ABNORMAL HIGH (ref <5.7)
Mean Plasma Glucose: 126 mg/dL — ABNORMAL HIGH (ref <117)

## 2010-11-25 LAB — LIPID PANEL
Cholesterol: 221 mg/dL — ABNORMAL HIGH (ref 0–200)
Triglycerides: 120 mg/dL (ref <150)
VLDL: 24 mg/dL (ref 0–40)

## 2010-11-25 NOTE — Assessment & Plan Note (Signed)
Unchanged, counseled to quit 

## 2010-11-25 NOTE — Assessment & Plan Note (Signed)
Controlled, no change in medication  

## 2010-11-25 NOTE — Assessment & Plan Note (Signed)
Low fat diet encouraged, also med compliance, uncontrolled at this time

## 2010-11-25 NOTE — Assessment & Plan Note (Signed)
hBA1C to be checked, pt is on metformin however med not brought to visit. Importance of diabetic diet and regular testing stressed

## 2010-11-25 NOTE — Assessment & Plan Note (Signed)
Deteriorated, depomedrol in the office and medication prescribed

## 2010-11-27 ENCOUNTER — Telehealth: Payer: Self-pay | Admitting: Family Medicine

## 2010-11-27 NOTE — Progress Notes (Signed)
Addended by: Adella Hare on: 11/27/2010 03:41 PM   Modules accepted: Orders

## 2010-11-27 NOTE — Telephone Encounter (Signed)
Spoke with patient.

## 2010-11-29 NOTE — H&P (Signed)
  NAME:  [name], [name]                ACCOUNT NO.:  [id number]  MEDICAL RECORD NO.:                     PATIENT TYPE:  LOCATION:                                 FACILITY:  PHYSICIAN:  Dalia Heading, M.D.  DATE OF BIRTH:  07-12-1941  DATE OF ADMISSION: DATE OF DISCHARGE:  LH                             HISTORY & PHYSICAL   CHIEF COMPLAINT:  GERD.  HISTORY OF PRESENT ILLNESS:  The patient is a 70 year old black female who is referred for endoscopic evaluation.  She needs an EGD for gastroesophageal reflux disease.  No weight loss, nausea, vomiting, diarrhea, constipation, melena, hematochezia have been noted.  She last had a colonoscopy less than 10 years ago by Dr. Katrinka Blazing.  No family history of colon carcinoma.  She has had increasing indigestion and GERD.  Proton pump inhibitors have not been helpful.  She occasionally has shortness of breath.  PAST MEDICAL HISTORY: 1. Morbid obesity. 2. Hypertension. 3. Sinusitis. 4. Allergies.  PAST SURGICAL HISTORY:  Cholecystectomy.  CURRENT MEDICATIONS:  Klor-Con, hydrochlorothiazide, amlodipine, regular aspirin, DuoNeb, lovastatin, cyclobenzaprine, Prilosec, vitamin D, Tessalon, Proventil inhaler.  ALLERGIES:  No known drug allergies.  REVIEW OF SYSTEMS:  The patient denies drinking or smoking.  She denies any other cardiopulmonary difficulties or bleeding disorders.  FAMILY MEDICAL HISTORY:  Noncontributory.  PHYSICAL EXAMINATION:  GENERAL:  The patient is an obese black female in no acute distress. LUNGS:  Clear to auscultation with equal breath sounds bilaterally. HEART:  Regular rate and rhythm without S3, S4, or murmurs. ABDOMEN:  Soft, nontender, nondistended.  No hepatosplenomegaly or masses are noted. RECTAL:  Deferred to the procedure.  IMPRESSION:  Gastroesophageal reflux disease.  PLAN:  The patient is scheduled for an EGD on November 07, 2010.  Risks and benefits of the procedure including bleeding and perforation  were fully explained to the patient, gave informed consent.     Dalia Heading, M.D.     MAJ/MEDQ  D:  10/31/2010  T:  11/01/2010  Job:  [id number]  cc:   Short Stay at [facility] E. Lodema Hong, M.D. Fax: [phone]  Electronically Signed by Franky Macho M.D. on 11/29/2010 09:17:33 AM

## 2010-12-05 NOTE — Letter (Signed)
May 07, 2007    [name]. [name], M.D.  [address], [address]  [place], [place] [address]   RE:  [name], [name]  MRN:  [id number]  /  DOB:  03-07-1941   Dear [name]:   [name] return to the office for continued assessment and treatment  of chest discomfort.  Since she started taking Prilosec on a regular  basis, her symptoms have essentially resolved, as best I can tell.  She  has had some setbacks financially, and it is difficult to determine  exactly what she is doing with her medication.  She continues to take  atorvastatin samples from your office, which is fine.  She has  discontinued aspirin.  Otherwise, her medications appear to be  unchanged.   Her exam remains benign.  There has been no change in her weight.  Her  blood pressure is good at 120/75.   [name] also reports completely discontinuing cigarette smoking.  Notably, she has not gained any weight.  She was apparently taking  Chantx but stopped it due to nightmares.  She feels anxious at the  present time and wants a cigarette but does not want to try nicotine  replacement therapy.   IMPRESSION:  [name] symptoms appear to have been gastrointestinal  in origin.  Her blood pressure is under good control.  Recent lipids  from your office show a total cholesterol of  223, HDL 45, and LDL of 150.  This is obviously suboptimal.  You may  wish to verify that she is taking atorvastatin 20 mg every day.  If so,  she could try simvastatin 80 mg from [facility] or continue on samples at an  increased dose.  I will leave this to your discretion.  Please let me  know at any time that I can assist in the care of this nice woman.    Sincerely,      Gerrit Friends. Dietrich Pates, MD, [facility]  Electronically Signed    RMR/MedQ  DD: 05/07/2007  DT: 05/08/2007  Job #: [id number]

## 2010-12-05 NOTE — Letter (Signed)
April 08, 2007    Milus Mallick. Lodema Hong, M.D.  [address]  [place], [place] [address]   RE:  [name], [name]  MRN:  [id number]  /  DOB:  1941/04/01   Dear Claris Che:   It is my pleasure evaluating Ms. Monarrez in the office today in  consultation at your request. As you know, this nice woman was  previously seen by Dr.  Dorethea Clan in 2006. A stress nuclear study was  negative at that time. Her symptoms resolved, but have recently  recurred. She has a history of GERD and uses Prilosec on a p.r.n. basis.  She now reports a localized area of deep discomfort in the lower  substernal/epigastric region that is accompanied by sighing respiration,  but no true dyspnea. There is no nausea or diaphoresis. Symptoms usually  resolve spontaneously over the course of an hour. There is no apparent  relationship to activity or meals. There is no radiation.   Ms. Mermelstein has an unfortunate number of cardiovascular risk factors  including hypertension, hyperlipidemia, and continued cigarette smoking.  She also has a history of DJD of the spine.   CURRENT MEDICATIONS:  1. Aspirin 325 mg daily.  2. Calcium with vitamin D.  3. Aloe vera.  4. Multivitamin.  5. Loratadine.  6. KCl 20 mEq daily.  7. Furosemide 20 mg daily.  8. Atorvastatin 20 mg nightly.   Recent laboratories shows a normal CBC and chemistry profile, but  suboptimal control of hyperlipidemia with total cholesterol of 223, HDL  of 45 and LDL of 150.   PAST MEDICAL HISTORY:  Updated. No notable changes.   SOCIAL HISTORY:  Updated. No notable changes.   FAMILY HISTORY:  Updated. No notable changes.   PHYSICAL EXAMINATION:  Pleasant, overweight woman. The weight is 231,  ten pounds more than in June of 2006. Blood pressure 125/75, heart rate  95 and regular. Respirations 18.  NECK: No jugular venous distention; normal carotid upstrokes without  bruits.  HEENT: EOMs full; normal lids and conjunctivae.  LUNGS:  Clear.  CARDIAC: Normal 1st and 2nd heart sounds; 4th heart sound present.  Normal PMI.  ABDOMEN: Soft and nontender; no organomegaly.  EXTREMITIES: No edema. Normal distal pulses.  NEUROMUSCULAR: Symmetric strength and tone; normal cranial nerves.   IMPRESSION:  Ms. Dickenson has very atypical chest discomfort. Rather than  repeat a stress test that probably would not provide [place] with a great  deal of additional information, I will ask her to take a PPI in the way  of a therapeutic trial. She will call your office, call mine or present  to the emergency department if she has more prolonged or severe  symptoms. I will reassess this nice woman again in one month.   Her control of hyperlipidemia is suboptimal and expensive. We will  substitute simvastatin 80 mg daily for atorvastatin and reassess a lipid  profile in two months. She will hold her aspirin for now.    Sincerely,      Gerrit Friends. Dietrich Pates, MD, [facility]  Electronically Signed    RMR/MedQ  DD: 04/08/2007  DT: 04/08/2007  Job #: [id number]

## 2010-12-08 NOTE — H&P (Signed)
[name], [name]                ACCOUNT NO.:  [id number]   MEDICAL RECORD NO.:  [id number]          PATIENT TYPE:  AMB   LOCATION:  DAY                           FACILITY:  [facility]   PHYSICIAN:  Jerolyn Shin C. Katrinka Blazing, M.D.   DATE OF BIRTH:  02-06-1941   DATE OF ADMISSION:  01/11/2005  DATE OF DISCHARGE:  LH                                HISTORY & PHYSICAL   HISTORY OF PRESENT ILLNESS:  A 70 year old female with history of  gastroesophageal reflux disease and gastritis. Status post  esophagogastroduodenoscopy in October 2005 with no evidence of stricture but  with evidence of gastritis. She is complaining of increasing dysphagia with  food. Clinically, she is better on Nexium except for the dysphagia. The  patient is scheduled for upper endoscopy and possible esophageal dilatation.   PAST MEDICAL HISTORY:  Hypertension, gastroesophageal reflux disease,  bronchitis, irritable bowel syndrome, osteoarthritis, and depression.   PAST SURGICAL HISTORY:  Laparoscopic cholecystectomy and left shoulder  surgery x2.   MEDICATIONS:  Nexium 40 mg daily, Pravachol 80 mg daily, Zoloft 25 mg daily,  Mucinex 600 mg twice daily, aspirin 325 mg daily.   PHYSICAL EXAMINATION:  VITAL SIGNS:  Blood pressure 178/96, pulse 90,  respiratory rate 20. Weight 218 pounds.  HEENT:  Unremarkable.  NECK:  Supple. No jugular venous distention, bruit, adenopathy, or  thyromegaly.  CHEST:  Clear to auscultation.  HEART:  Regular rate and rhythm. Without murmur, rub, or gallop.  ABDOMEN:  Soft, non-tender, no masses.  EXTREMITIES:  No clubbing, cyanosis, or edema.  NEUROLOGIC:  No focal, motor, sensory, or cerebellar deficits.   IMPRESSION:  1.  Gastroesophageal reflux disease with progressive dysphagia.  2.  Chronic gastritis.  3.  Hypertension.  4.  Irritable bowel syndrome.  5.  Chronic bronchitis.  6.  Osteoarthritis.   PLAN:  Upper endoscopy with possible esophageal dilatation.       LCS/MEDQ  D:   01/10/2005  T:  01/11/2005  Job:  [id number]

## 2010-12-08 NOTE — H&P (Signed)
[name], [name]                ACCOUNT NO.:  [id number]   MEDICAL RECORD NO.:  [id number]          PATIENT TYPE:  AMB   LOCATION:                                FACILITY:  [facility]   PHYSICIAN:  Vickki Hearing, M.D.DATE OF BIRTH:  1940-10-24   DATE OF ADMISSION:  05/29/2005  DATE OF DISCHARGE:  LH                                HISTORY & PHYSICAL   CHIEF COMPLAINT:  Right knee pain.   HISTORY OF PRESENT ILLNESS:  This is a 70 year old female with severe pain  and dysfunction of the right knee for several years now.  She complains of  severe, constant, dull, aching pain worsened by sitting with the knee flexed  and relieved by straightening the knee out.  It is also worsened by  ambulation.  She has been treated with nonoperative therapy including  injection.  She had radiographs consistent with her complaints of knee pain  indicating osteoarthritis of the knee with varus deformity.  She initially  did not want to have surgery right away, but pain has progressively worsened  and she agrees to have surgery at this time.   We have discussed the risks and benefits of the procedure.  We have  demonstrated the procedure with a knee model and also with an [employer] handout and we have sent her to a preop  education class.   PHYSICAL EXAMINATION:  GENERAL:  She has normal development, grooming,  hygiene and nutrition.  Body habitus would be best described as medium  build.  She is alert and oriented x3 with normal sensation in her  extremities.  CARDIAC:  Normal pulses with no venous stasis, temperature changes or edema.  SKIN:  Normal in all four extremities.  LYMPHS:  Benign.  EXTREMITIES:  Right knee examination reveals range of motion of 0-135 with  normal strength and stability.  Inspection reveals crepitance, swelling and  tenderness with varus alignment to the right knee.  There is tenderness of  the medical compartment.  Her opposite knee is  similar, but not as tender.  Her upper extremities show no contractures, atrophy, luxations or  malalignment.   LABORATORY DATA AND X-RAY FINDINGS:  Initial films showed osteoarthritis  with varus deformity and her repeat films today show the same.   ASSESSMENT:  Osteoarthritis, right knee.   PLAN:  Right total knee on May 29, 2005, with DePuy total knee system.      Vickki Hearing, M.D.  Electronically Signed     SEH/MEDQ  D:  05/03/2005  T:  05/03/2005  Job:  [id number]

## 2010-12-08 NOTE — Discharge Summary (Signed)
[name], MARTIRE                ACCOUNT NO.:  [id number]   MEDICAL RECORD NO.:  [id number]          PATIENT TYPE:  INP   LOCATION:  A339                          FACILITY:  [facility]   PHYSICIAN:  Vickki Hearing, M.D.DATE OF BIRTH:  21-Feb-1941   DATE OF ADMISSION:  05/29/2005  DATE OF DISCHARGE:  11/12/2006LH                                 DISCHARGE SUMMARY   ADMISSION DIAGNOSES:  Osteoarthritis right knee.   DISCHARGE DIAGNOSES:  Same.   OPERATIVE PROCEDURE:  On November 12, she underwent an uncomplicated right  total knee replacement with a rotating platform DePuy sigma, posterior  stabilized total knee replacement.  She tolerated that well, was admitted to  the floor, started a protocol for knee replacement.   Past specimen showed bone fragments for degenerative joint disease.   HOSPITAL COURSE:  After admission, she underwent surgery, tolerated that  well, postoperatively did very well.  Her discharge was delayed because her  insurance company did not have an appropriate bed for her which caused her  to stay an extra day, but therapy-wise, she was able to ambulate 40+ feet,  passive range of motion was approximately 70 degrees, CPM went up to 80  degrees.   DISCHARGE MEDICATIONS:  1.  Iron 325 mg t.i.d.  2.  Colace 100 mg b.i.d.  3.  Resume Lipitor 40 mg q.h.s.  4.  Continue aspirin 325 mg b.i.d.  5.  She is discharged with bilateral lower extremity TED hose.   DISCHARGE INSTRUCTIONS:  Home health and physical therapy.   FOLLOWUP:  Follow up with me will be on June 11, 2005.  We will remove  staples at that time.   CONDITION ON DISCHARGE:  Afebrile and stable.      Vickki Hearing, M.D.  Electronically Signed     SEH/MEDQ  D:  06/03/2005  T:  06/03/2005  Job:  [id number]

## 2010-12-08 NOTE — Group Therapy Note (Signed)
[name], [name]                ACCOUNT NO.:  [id number]   MEDICAL RECORD NO.:  [id number]          PATIENT TYPE:  INP   LOCATION:  A339                          FACILITY:  [facility]   PHYSICIAN:  Vickki Hearing, M.D.DATE OF BIRTH:  1940/10/02   DATE OF PROCEDURE:  06/02/2005  DATE OF DISCHARGE:  06/03/2005                                   PROGRESS NOTE   The patient is status post a right total knee replacement.  She is advancing  well with physical therapy, CPM and ambulation.  She indicates she is ready  to go home.  Will arrange for discharge planning for tomorrow.  She is to  follow up with me in approximately two weeks.  She will have home physical  therapy, home health aid, potty chair, walker, shower chair for her  discharge.  Labs are stable.  Hemoglobin 8.4, initial hemoglobin 8.9, but  she is asymptomatic.  Does not need any transfusion.      Vickki Hearing, M.D.  Electronically Signed     SEH/MEDQ  D:  06/02/2005  T:  06/03/2005  Job:  [id number]

## 2010-12-08 NOTE — Consult Note (Signed)
[name], [name]                ACCOUNT NO.:  [id number]   MEDICAL RECORD NO.:  [id number]          PATIENT TYPE:  INP   LOCATION:  A339                          FACILITY:  [facility]   PHYSICIAN:  Osvaldo Shipper, MD     DATE OF BIRTH:  12-20-1940   DATE OF CONSULTATION:  05/29/2005  DATE OF DISCHARGE:                                   CONSULTATION   REFERRING PHYSICIAN:  Vickki Hearing, M.D.   REASON FOR CONSULTATION:  Medical management.   CHIEF COMPLAINT:  Slight pain over surgical site.   HISTORY OF PRESENT ILLNESS:  Patient is a 70 year old African American  female who has history of acid reflux disease, sinus infection and arthritis  who was operated earlier today for right total knee replacement by Dr.  Vickki Hearing.  Patient's primary medical doctor is Dr. Milus Mallick.  [place].  Postoperatively, patient is doing well.  She has slight pain over  her right knee area as well as slight nausea but no emesis.  She does not  have any other complaints of abdominal pain, chest pain, or shortness of  breath.   HOME MEDICATIONS:  1.  Prilosec.  2.  Multivitamins.  3.  Lescol.  4.  Aspirin.  Doses are unknown at this time.   ALLERGIES:  NO KNOWN DRUG ALLERGIES.   PAST MEDICAL HISTORY:  1.  Acid reflux disease.  2.  Arthritis.  3.  She use to have hypertension, however, she was taken off of her      antihypertensive agents earlier this year.  4.  She does not have any diabetes, no coronary artery disease, no pulmonary      disease or history of stroke or cancers.   PAST SURGICAL HISTORY:  1.  Rotator cuff injury for which she has had procedure done.  2.  History of laser surgery for gallstones.  3.  History of partial hysterectomy.  4.  Patient had a right total knee replacement earlier today.   SOCIAL HISTORY:  [place]  She lives  alone.  She does have a daughter who lives in town.  She is currently on  disability, smokes  three to four cigarettes per day.  Has about 10 to 15-  pack-year history of smoking.  No alcohol or illicit drug use.   FAMILY HISTORY:  Mother had diabetes, hypertension.  Mother's side of the  family had history of coronary artery disease and CVAs. Her father died of  pneumonia, otherwise did not have any other medical problems.   REVIEW OF SYSTEMS:  A 10-system review was done which was remarkable just  for some nausea and pain over her right knee, otherwise completely  unremarkable.   PHYSICAL EXAMINATION:  GENERAL APPEARANCE:  This is an obese female in no  apparent distress.  VITAL SIGNS:  She is afebrile.  Blood pressure 137/81, heart rate is about  90 and regular, respiratory rate 16, saturating 99% on room air.  HEENT:  There is no pallor, no icterus.  Oral mucosa is moist.  NECK:  Soft  and supple.  No thyromegaly is appreciated.  CARDIOVASCULAR:  S1 and S2 is normal, regular.  There might be diastolic  murmur that I am appreciating in the aortic area.  No S3 or S4 was heard.  No JVD seen.  ABDOMEN:  Soft, nontender, nondistended, bowel sounds are present.  No  organomegaly or masses appreciated.  EXTREMITIES:  Both extremities in Flowtrons, right side is covered with  bandage as well.  Good capillary refill is present in both lower  extremities.  NEUROLOGIC:  Patient is alert and oriented x3.  No focal deficits  appreciated.   LABORATORY DATA:  No labs available at this time.   IMPRESSION:  This is a 70 year old African American female with history of  gastroesophageal reflux disease, arthritis, recurrent sinus infections who  is postoperative from right total knee replacement earlier today.  She seems  to be doing fairly well postoperatively.  She is on PCA pump for analgesia.  Her pain is well controlled.  Patient has been put on deep venous thrombosis  prophylaxis.   RECOMMENDATIONS:  Continue current management.  Physical therapy per Dr.  Romeo Apple.  Her medical  issues are all stable at this time.  I will obtain an  echo report from [facility] to see if there is any reason for her  diastolic murmur if at all.  Patient does mention that she had a recent  echocardiogram.   We would like to thank Dr. Romeo Apple for allowing [place] to consult on this  patient.  We will be happy to follow the patient along while she is in the  hospital.      Osvaldo Shipper, MD  Electronically Signed     GK/MEDQ  D:  05/29/2005  T:  05/29/2005  Job:  [id number]   cc:   Milus Mallick. Lodema Hong, M.D.  Fax: [phone]   Vickki Hearing, M.D.  Fax: [phone]

## 2010-12-08 NOTE — Op Note (Signed)
NAMEILMA, ACHEE                ACCOUNT NO.:  [id number]   MEDICAL RECORD NO.:  [id number]          PATIENT TYPE:  INP   LOCATION:  A339                          FACILITY:  [facility]   PHYSICIAN:  Vickki Hearing, M.D.DATE OF BIRTH:  1941/03/16   DATE OF PROCEDURE:  05/29/2005  DATE OF DISCHARGE:                                 OPERATIVE REPORT   INDICATIONS:  Right knee pain.   HISTORY:  A 70 year old female, chronic right knee pain, end-stage  osteoarthritis, presented for right total knee replacement after  conservative treatment failed.   PREOPERATIVE DIAGNOSIS:  Osteoarthritis, right knee.   POSTOPERATIVE DIAGNOSIS:  Osteoarthritis, right knee.   OPERATIVE FINDINGS:  Severe degenerative arthritis of the right knee, all  three compartments involved.   ANESTHETIC:  Spinal regional.   PROCEDURE:  Right total knee replacement.   IMPLANTS:  DePuy rotating platform, 3 femur, 3 tibia, 32 patella, 12.5  bearing posterior stabilized.   SURGEON:  Dr. Romeo Apple.   SPECIMENS:  Bone fragments.   BLOOD LOSS:  Minimal.   COMPLICATIONS:  None.   COUNTS:  Correct.   ASSISTANTS:  Morley Nation and Angela Congo.   DETAILS OF PROCEDURE:  Ms. Hendler was identified as Sherryn Pollino in the  preop holding area. She marked her right knee as the surgical site. This was  countersigned by the surgeon after the history and physical was updated. She  was given her preoperative antibiotics and taken to the operating room for  spinal anesthetic. After successful anesthesia, she was placed supine on the  operating table, and a Foley catheter was placed. A tourniquet was supplied  to her right proximal thigh, and her right leg was prepped and draped using  normal sterile technique with a standard prep solution.   After the draping, we took a time-out and completed the parameters of the  time-out. We then exsanguinated the limb with a 6-inch Esmarch, elevated the  tourniquet where it  stayed for an hour and 9 minutes. We made an incision  over the patella with the knee in flexion, divided the subcu tissue down to  the extensor mechanism. We did a medial arthrotomy, everted the patella, did  a medial soft tissue sleeve elevation, and subluxated the tibia forward. We  sent the tibial guide for a neutral cut at 0 degrees and neutral in the  sagittal plane. We cut the tibia 90 degrees to the tibial axis, removing 10  mm of bone from the normal lateral side. We measured the tibia to a size  three which gave the best coverage.   We then inserted a drill bit into the femoral canal, evacuated the marrow  contents with suction and irrigation, set our distal femoral cut for a size  11, removed 11 mm of distal femoral bone. We then checked the extension gap  and found it to receptive to 10-mm tray with good collateral ligament  balance.   We measured the femur to a size 3, made the 4 cuts, measured the flexion gap  and found a 12.5- mm spacer block fit well. We then  tested the extension gap  and 12.5 also fit well. We then made the box cut, made the patellar cut, and  then did a trial reduction with a 3 femur, 3 tibia, base plate with a 78.4  insert, and a 32-mm patella which was previously resected from a 24 to a 14  with 8-mm patellar thickness. We did find that we needed to do a lateral  release of the patella. We obtained good flexion/extension stability with  approximately 10 degrees of rotation of the rotating platform from flexion  to extension.   We then made finished the prepared preparation for the tibia, trialed with a  12.5 bearing, then removed all the trial components, irrigated the knee,  dried the bone surfaces, cemented the implants in place, then retrialed with  10.0 and 12.5 bearing. The 12.5 bearing gave the best stability, allowed  full extension, and allowed passive flexion of 120 degrees.   We then placed the real 12.5-mm bearing, injected the knee with  30 cc of  Marcaine with epinephrine, inserted an intra-articular pain catheter, closed  the extensor mechanism with interrupted and running Bralon suture, closed  the subcu with 0-0 and 2-0 Monocryl, inserted a Hemovac drain in the subcu,  and then closed the skin with staples, applied sterile dressings, CryoCuff.  We did release the tourniquet at an hour and 9 minutes.   Postoperative radiographs will be taken in the recovery room. We use a  standard total knee protocol.      Vickki Hearing, M.D.  Electronically Signed     SEH/MEDQ  D:  05/29/2005  T:  05/29/2005  Job:  [id number]

## 2010-12-21 NOTE — Telephone Encounter (Signed)
Patient is aware 

## 2011-01-08 ENCOUNTER — Encounter: Payer: Self-pay | Admitting: Family Medicine

## 2011-01-08 ENCOUNTER — Ambulatory Visit (INDEPENDENT_AMBULATORY_CARE_PROVIDER_SITE_OTHER): Payer: Medicare Other | Admitting: Family Medicine

## 2011-01-08 VITALS — BP 138/82 | HR 107 | Temp 98.5°F | Resp 16 | Ht 66.0 in | Wt 227.0 lb

## 2011-01-08 DIAGNOSIS — H9209 Otalgia, unspecified ear: Secondary | ICD-10-CM

## 2011-01-08 DIAGNOSIS — E785 Hyperlipidemia, unspecified: Secondary | ICD-10-CM

## 2011-01-08 DIAGNOSIS — H9203 Otalgia, bilateral: Secondary | ICD-10-CM

## 2011-01-08 DIAGNOSIS — E119 Type 2 diabetes mellitus without complications: Secondary | ICD-10-CM

## 2011-01-08 DIAGNOSIS — J019 Acute sinusitis, unspecified: Secondary | ICD-10-CM

## 2011-01-08 DIAGNOSIS — Z5689 Other problems related to employment: Secondary | ICD-10-CM

## 2011-01-08 DIAGNOSIS — J0100 Acute maxillary sinusitis, unspecified: Secondary | ICD-10-CM

## 2011-01-08 DIAGNOSIS — I10 Essential (primary) hypertension: Secondary | ICD-10-CM

## 2011-01-08 DIAGNOSIS — Z566 Other physical and mental strain related to work: Secondary | ICD-10-CM

## 2011-01-08 MED ORDER — ANTIPYRINE-BENZOCAINE 5.4-1.4 % OT SOLN: 3.0000 [drp] | Freq: Four times a day (QID) | OTIC | Status: AC | PRN

## 2011-01-08 MED ORDER — PENICILLIN V POTASSIUM 500 MG PO TABS: 500.0000 mg | ORAL_TABLET | Freq: Three times a day (TID) | ORAL | Status: AC

## 2011-01-08 NOTE — Progress Notes (Signed)
  Subjective:    Patient ID: [name], female    DOB: September 05, 1940, 70 y.o.   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 Reports being afraid to even cook, feels she might leave the food burning on the stove.Wants a doctor's note stating that she is under a lot of stress on her current job, and she needs to quit. C/obilateral otalgia x 1 week, post nasal drainage which is green x 1 week, no fever or chills Saw opthalmologist recently, and was told  that there is no residual eye disease, but has fluid behind her eye   Review of Systems Denies recent fever or chills. Denies chest congestion, productive cough or wheezing. Denies chest pains, palpitations, paroxysmal nocturnal dyspnea, orthopnea and leg swelling Denies abdominal pain, nausea, vomiting,diarrhea or constipation.  Denies rectal bleeding or change in bowel movement. Denies dysuria, frequency, hesitancy or incontinence. Denies joint pain, swelling and limitation in mobility. Denies headaches, seizure, numbness, or tingling. . Denies skin break down or rash.        Objective:   Physical Exam Patient alert and oriented and in no Cardiopulmonary distress.  HEENT: No facial asymmetry, EOMI,maxillary  sinus tenderness, TM's clear, Oropharynx pink and moist.  Neck supple no adenopathy.External ear canal is normal  Chest: Clear to auscultation bilaterally.  CVS: S1, S2 no murmurs, no S3.  ABD: Soft non tender. Bowel sounds normal.  Ext: No edema  MS: Adequate ROM spine, shoulders, hips and knees.  Skin: Intact, no ulcerations or rash noted.  Psych: Good eye contact, normal affect. Memory intact  anxious   CNS: CN 2-12 intact,  power, tone and sensation normal throughout.        Assessment & Plan:

## 2011-01-08 NOTE — Assessment & Plan Note (Signed)
Controlled, no change in medication  

## 2011-01-08 NOTE — Patient Instructions (Addendum)
F/u  As before.  You are being treated or sinusitis and ear pain,, med is being sent in.  You will get  A note for work Remember your mammogram

## 2011-01-08 NOTE — Assessment & Plan Note (Signed)
Low fat diet encouraged, need rept labs to determine control, continue current med

## 2011-01-08 NOTE — Assessment & Plan Note (Signed)
Excessive, pt reports being unable to continue to carry out current responsibilities, sleep deprived and excessively stressed, work note written

## 2011-01-08 NOTE — Assessment & Plan Note (Signed)
Acute infection

## 2011-01-08 NOTE — Assessment & Plan Note (Addendum)
Bilateral ear pain, will treat symptomatically, no evidence of infection

## 2011-01-17 NOTE — Assessment & Plan Note (Signed)
Acute infection, med prescribed 

## 2011-01-22 ENCOUNTER — Ambulatory Visit (HOSPITAL_COMMUNITY)
Admission: RE | Admit: 2011-01-22 | Discharge: 2011-01-22 | Disposition: A | Discharge status: Home / Self Care | Payer: Medicare Other | Source: Ambulatory Visit | Attending: Family Medicine | Admitting: Family Medicine

## 2011-01-22 DIAGNOSIS — Z139 Encounter for screening, unspecified: Secondary | ICD-10-CM

## 2011-01-22 DIAGNOSIS — Z1231 Encounter for screening mammogram for malignant neoplasm of breast: Secondary | ICD-10-CM | POA: Insufficient documentation

## 2011-03-06 ENCOUNTER — Ambulatory Visit (INDEPENDENT_AMBULATORY_CARE_PROVIDER_SITE_OTHER): Payer: Medicare Other | Admitting: Family Medicine

## 2011-03-06 ENCOUNTER — Encounter: Payer: Self-pay | Admitting: Family Medicine

## 2011-03-06 VITALS — BP 140/80 | HR 90 | Resp 14 | Ht 66.0 in | Wt 228.0 lb

## 2011-03-06 DIAGNOSIS — I10 Essential (primary) hypertension: Secondary | ICD-10-CM

## 2011-03-06 DIAGNOSIS — J309 Allergic rhinitis, unspecified: Secondary | ICD-10-CM

## 2011-03-06 DIAGNOSIS — IMO0002 Reserved for concepts with insufficient information to code with codable children: Secondary | ICD-10-CM

## 2011-03-06 DIAGNOSIS — E119 Type 2 diabetes mellitus without complications: Secondary | ICD-10-CM

## 2011-03-06 DIAGNOSIS — M549 Dorsalgia, unspecified: Secondary | ICD-10-CM

## 2011-03-06 DIAGNOSIS — E785 Hyperlipidemia, unspecified: Secondary | ICD-10-CM

## 2011-03-06 MED ORDER — IBUPROFEN 600 MG PO TABS: 600.0000 mg | ORAL_TABLET | Freq: Two times a day (BID) | ORAL | Status: AC

## 2011-03-06 MED ORDER — METHYLPREDNISOLONE ACETATE 80 MG/ML IJ SUSP
80.0000 mg | Freq: Once | INTRAMUSCULAR | Status: AC
  Administered 2011-03-06: 80 mg via INTRAMUSCULAR

## 2011-03-06 MED ORDER — PREDNISONE (PAK) 5 MG PO TABS: 5.0000 mg | ORAL_TABLET | ORAL | Status: DC

## 2011-03-06 MED ORDER — KETOROLAC TROMETHAMINE 60 MG/2ML IM SOLN
60.0000 mg | Freq: Once | INTRAMUSCULAR | Status: AC
  Administered 2011-03-06: 60 mg via INTRAMUSCULAR

## 2011-03-06 NOTE — Progress Notes (Signed)
  Subjective:    Patient ID: [name], female    DOB: 1940/08/09, 70 y.o.   MRN: [id number]  HPI 2 week h/o low back pain, does not radiate down her legs, no weakness  Or numbness in legs or incontinence of stool or urine. Otherwise reports she has been doing fairly well. Checks her blood sugars daily and fasting sugars are seldom over 110. Has increased allergy symptoms, sneezing , watery nasal drainage , denies fever, chills or productive cough   Review of Systems See HPI Denies recent fever or chills. Denies sinus pressure,  ear pain or sore throat. Denies chest congestion, productive cough or wheezing. Denies chest pains, palpitations and leg swelling Denies abdominal pain, nausea, vomiting,diarrhea or constipation.   Denies dysuria, frequency, hesitancy or incontinence.  Denies headaches, seizures, numbness, or tingling. Denies depression, anxiety or insomnia. Denies skin break down or rash.        Objective:   Physical Exam Patient alert and oriented and in no cardiopulmonary distress.  HEENT: No facial asymmetry, EOMI, no sinus tenderness,  oropharynx pink and moist.  Neck supple no adenopathy. Nasal mucosa erythematous  Chest: Clear to auscultation bilaterally.  CVS: S1, S2 no murmurs, no S3.  ABD: Soft non tender. Bowel sounds normal.  Ext: No edema  MS: Decreased  ROM spine, shoulders, hips and knees.  Skin: Intact, no ulcerations or rash noted.  Psych: Good eye contact, normal affect. Memory intact not anxious or depressed appearing.  CNS: CN 2-12 intact, power, tone and sensation normal throughout.        Assessment & Plan:

## 2011-03-06 NOTE — Patient Instructions (Addendum)
F/u as before.  You will get injections in the office today and also med is sent to the pharmacy as well, take as directed   Your back pain is coming from arthritis  Start the tablets tomorrow, and do not take aspirin until you have finished the ibuprofen  Take 1 prilosec every day for the next 2 weeks

## 2011-03-17 LAB — COMPLETE METABOLIC PANEL WITH GFR
ALT: 12 U/L (ref 0–35)
AST: 15 U/L (ref 0–37)
Albumin: 4 g/dL (ref 3.5–5.2)
CO2: 28 mEq/L (ref 19–32)
Calcium: 9.3 mg/dL (ref 8.4–10.5)
Chloride: 103 mEq/L (ref 96–112)
GFR, Est African American: >60 mL/min (ref >60)
Potassium: 4.7 mEq/L (ref 3.5–5.3)
Sodium: 142 mEq/L (ref 135–145)
Total Protein: 7 g/dL (ref 6.0–8.3)

## 2011-03-17 LAB — LIPID PANEL
LDL Cholesterol: 132 mg/dL — ABNORMAL HIGH (ref 0–99)
Triglycerides: 115 mg/dL (ref <150)
VLDL: 23 mg/dL (ref 0–40)

## 2011-03-17 LAB — HEMOGLOBIN A1C: Mean Plasma Glucose: 131 mg/dL — ABNORMAL HIGH (ref <117)

## 2011-03-19 MED ORDER — LOVASTATIN 40 MG PO TABS: ORAL_TABLET | ORAL | Status: DC

## 2011-03-19 NOTE — Progress Notes (Signed)
Patient is aware and is already taking two at bedtime

## 2011-03-19 NOTE — Assessment & Plan Note (Signed)
Deteriorated, injection administered and medication prescribed

## 2011-03-19 NOTE — Assessment & Plan Note (Signed)
Uncontrolled, compliance with medication and diet stressed

## 2011-03-19 NOTE — Assessment & Plan Note (Signed)
Increased symptoms due to weather change

## 2011-03-19 NOTE — Assessment & Plan Note (Signed)
Controlled, no change in medication  

## 2011-03-28 ENCOUNTER — Encounter: Payer: Self-pay | Admitting: Family Medicine

## 2011-03-29 ENCOUNTER — Ambulatory Visit (INDEPENDENT_AMBULATORY_CARE_PROVIDER_SITE_OTHER): Payer: Medicare Other | Admitting: Family Medicine

## 2011-03-29 ENCOUNTER — Encounter: Payer: Self-pay | Admitting: Family Medicine

## 2011-03-29 VITALS — BP 118/78 | HR 90 | Resp 16 | Ht 66.0 in | Wt 222.4 lb

## 2011-03-29 DIAGNOSIS — J309 Allergic rhinitis, unspecified: Secondary | ICD-10-CM

## 2011-03-29 DIAGNOSIS — R5381 Other malaise: Secondary | ICD-10-CM

## 2011-03-29 DIAGNOSIS — M549 Dorsalgia, unspecified: Secondary | ICD-10-CM

## 2011-03-29 DIAGNOSIS — J449 Chronic obstructive pulmonary disease, unspecified: Secondary | ICD-10-CM

## 2011-03-29 DIAGNOSIS — R7309 Other abnormal glucose: Secondary | ICD-10-CM

## 2011-03-29 DIAGNOSIS — F17200 Nicotine dependence, unspecified, uncomplicated: Secondary | ICD-10-CM

## 2011-03-29 DIAGNOSIS — E785 Hyperlipidemia, unspecified: Secondary | ICD-10-CM

## 2011-03-29 DIAGNOSIS — Z23 Encounter for immunization: Secondary | ICD-10-CM

## 2011-03-29 DIAGNOSIS — R5383 Other fatigue: Secondary | ICD-10-CM

## 2011-03-29 DIAGNOSIS — Z5689 Other problems related to employment: Secondary | ICD-10-CM

## 2011-03-29 DIAGNOSIS — E119 Type 2 diabetes mellitus without complications: Secondary | ICD-10-CM

## 2011-03-29 DIAGNOSIS — Z566 Other physical and mental strain related to work: Secondary | ICD-10-CM

## 2011-03-29 DIAGNOSIS — I10 Essential (primary) hypertension: Secondary | ICD-10-CM

## 2011-03-29 DIAGNOSIS — R7303 Prediabetes: Secondary | ICD-10-CM

## 2011-03-29 NOTE — Patient Instructions (Signed)
F/u end January.   Fasting labs in January  LABWORK  NEEDS TO BE DONE BETWEEN 3 TO 7 DAYS BEFORE YOUR NEXT SCEDULED  VISIT.  THIS WILL IMPROVE THE QUALITY OF YOUR CARE.  Flu vaccine today.  Pls cut back on fried and fatty foods   Please think about quitting smoking.  This is very important for your health.  Consider setting a quit date, then cutting back or switching brands to prepare to stop.  Also think of the money you will save every day by not smoking.  Quick Tips to Quit Smoking: Fix a date i.e. keep a date in mind from when you would not touch a tobacco product to smoke  Keep yourself busy and block your mind with work loads or reading books or watching movies in malls where smoking is not allowed  Vanish off the things which reminds you about smoking for example match box, or your favorite lighter, or the pipe you used for smoking, or your favorite jeans and shirt with which you used to enjoy smoking, or the club where you used to do smoking  Try to avoid certain people places and incidences where and with whom smoking is a common factor to add on  Praise yourself with some token gifts from the money you saved by stopping smoking  Anti Smoking teams are there to help you. Join their programs  Anti-smoking Gums are there in many medical shops. Try them to quit smoking   Side-effects of Smoking: Disease caused by smoking cigarettes are emphysema, bronchitis, heart failures  Premature death  Cancer is the major side effect of smoking  Heart attacks and strokes are the quick effects of smoking causing sudden death  Some smokers lives end up with limbs amputated  Breathing problem or fast breathing is another side effect of smoking  Due to more intakes of smokes, carbon mono-oxide goes into your brain and other muscles of the body which leads to swelling of the veins and blockage to the air passage to lungs  Carbon monoxide blocks blood vessels which leads to blockage in the flow  of blood to different major body organs like heart lungs and thus leads to attacks and deaths  During pregnancy smoking is very harmful and leads to premature birth of the infant, spontaneous abortions, low weight of the infant during birth  Fat depositions to narrow and blocked blood vessels causing heart attacks  In many cases cigarette smoking caused infertility in men

## 2011-03-30 MED ORDER — INFLUENZA VAC TYPES A & B PF IM SUSP: 0.5000 mL | Freq: Once | INTRAMUSCULAR | Status: DC

## 2011-04-15 NOTE — Assessment & Plan Note (Signed)
Controlled, but deterioration in same, encouraged to lower carb intake, this is discussed

## 2011-04-15 NOTE — Assessment & Plan Note (Signed)
Resolved, pt currently unemployed

## 2011-04-15 NOTE — Assessment & Plan Note (Signed)
Improved with daily use of medication 

## 2011-04-15 NOTE — Assessment & Plan Note (Signed)
Controlled, no change in medication  

## 2011-04-15 NOTE — Assessment & Plan Note (Signed)
Pt continues to smoke, hence her condition continues to deteriorate

## 2011-04-15 NOTE — Assessment & Plan Note (Signed)
Improved but uncontrolled, continue current med. Low fat diet discussed and encouraged

## 2011-04-15 NOTE — Progress Notes (Signed)
  Subjective:    Patient ID: [name], female    DOB: 1941-03-11, 70 y.o.   MRN: [id number]  HPI The PT is here for follow up and re-evaluation of chronic medical conditions, medication management and review of any available recent lab and radiology data.  Preventive health is updated, specifically  Cancer screening and Immunization.   Questions or concerns regarding consultations or procedures which the PT has had in the interim are  addressed. The PT denies any adverse reactions to current medications since the last visit.  There are no new concerns.  There are no specific complaints       Review of Systems    Denies recent fever or chills. Denies sinus pressure, nasal congestion, ear pain or sore throat. Denies chest congestion, productive cough or wheezing. Denies chest pains, palpitations and leg swelling Denies abdominal pain, nausea, vomiting,diarrhea or constipation.   Denies dysuria, frequency, hesitancy or incontinence. Denies joint pain, swelling and limitation in mobility. Denies headaches, seizures, numbness, or tingling. Denies depression, anxiety or insomnia. Denies skin break down or rash.     Objective:   Physical Exam  Patient alert and oriented and in no cardiopulmonary distress.  HEENT: No facial asymmetry, EOMI, no sinus tenderness,  oropharynx pink and moist.  Neck supple no adenopathy.  Chest: Clear to auscultation bilaterally.Decreased air entry bilaterally  CVS: S1, S2 no murmurs, no S3.  ABD: Soft non tender. Bowel sounds normal.  Ext: No edema  MS: Adequate though reduced ROM spine, shoulders, hips and knees.  Skin: Intact, no ulcerations or rash noted.  Psych: Good eye contact, normal affect. Memory intact not anxious or depressed appearing.  CNS: CN 2-12 intact, power, tone and sensation normal throughout.       Assessment & Plan:

## 2011-06-25 ENCOUNTER — Encounter: Payer: Self-pay | Admitting: Family Medicine

## 2011-06-25 ENCOUNTER — Ambulatory Visit (INDEPENDENT_AMBULATORY_CARE_PROVIDER_SITE_OTHER): Payer: Medicare Other | Admitting: Family Medicine

## 2011-06-25 VITALS — BP 130/76 | HR 101 | Resp 18 | Ht 66.0 in | Wt 225.0 lb

## 2011-06-25 DIAGNOSIS — J40 Bronchitis, not specified as acute or chronic: Secondary | ICD-10-CM

## 2011-06-25 DIAGNOSIS — J209 Acute bronchitis, unspecified: Secondary | ICD-10-CM | POA: Insufficient documentation

## 2011-06-25 DIAGNOSIS — B351 Tinea unguium: Secondary | ICD-10-CM

## 2011-06-25 DIAGNOSIS — E119 Type 2 diabetes mellitus without complications: Secondary | ICD-10-CM

## 2011-06-25 DIAGNOSIS — I10 Essential (primary) hypertension: Secondary | ICD-10-CM

## 2011-06-25 DIAGNOSIS — IMO0002 Reserved for concepts with insufficient information to code with codable children: Secondary | ICD-10-CM

## 2011-06-25 DIAGNOSIS — F17200 Nicotine dependence, unspecified, uncomplicated: Secondary | ICD-10-CM

## 2011-06-25 DIAGNOSIS — R1900 Intra-abdominal and pelvic swelling, mass and lump, unspecified site: Secondary | ICD-10-CM

## 2011-06-25 MED ORDER — TERBINAFINE HCL 250 MG PO TABS: 250.0000 mg | ORAL_TABLET | Freq: Every day | ORAL | Status: DC

## 2011-06-25 MED ORDER — BENZONATATE 100 MG PO CAPS: 100.0000 mg | ORAL_CAPSULE | Freq: Four times a day (QID) | ORAL | Status: DC | PRN

## 2011-06-25 MED ORDER — KETOROLAC TROMETHAMINE 60 MG/2ML IM SOLN
60.0000 mg | Freq: Once | INTRAMUSCULAR | Status: AC
  Administered 2011-06-25: 60 mg via INTRAMUSCULAR

## 2011-06-25 MED ORDER — PENICILLIN V POTASSIUM 500 MG PO TABS: 500.0000 mg | ORAL_TABLET | Freq: Three times a day (TID) | ORAL | Status: AC

## 2011-06-25 MED ORDER — METHYLPREDNISOLONE ACETATE 80 MG/ML IJ SUSP
80.0000 mg | Freq: Once | INTRAMUSCULAR | Status: AC
  Administered 2011-06-25: 80 mg via INTRAMUSCULAR

## 2011-06-25 NOTE — Assessment & Plan Note (Signed)
Controlled, no change in medication  

## 2011-06-25 NOTE — Progress Notes (Signed)
  Subjective:    Patient ID: [name], female    DOB: 1940/08/13, 70 y.o.   MRN: [id number]  HPI 10 day h/o head and chest congestion, thick yellow sputum and yellow post nasal drainage. Intermittent chills , no fever Also increased low back pain, radiating across hips, but not down legs C/o abdominal swelling and discomfort which is worsening, epigastric and LUQ location, "cuts off my breath at times"   Review of Systems See HPI Denies chest pains, palpitations and leg swelling Denies nausea, vomiting,diarrhea or constipation.   Denies dysuria, frequency, hesitancy or incontinence. Denies joint pain, swelling and limitation in mobility. Denies headaches, seizures, numbness, or tingling. Denies depression, anxiety or insomnia. Denies skin break down or rash.        Objective:   Physical Exam Patient alert and oriented and in no cardiopulmonary distress.  HEENT: No facial asymmetry, EOMI, no sinus tenderness,  oropharynx pink and moist.  Neck supple no adenopathy.  Chest: decreased air entry scattered crackles , few wheezes.  CVS: S1, S2 no murmurs, no S3.  ABD: epigastric tenderness, lUQ mass, bowel sounds normal, no guarding or rebound Ext: No edema  MS: decreased ROM spine,adequate in  shoulders, hips and knees.  Skin: Intact, no ulcerations or rash noted.onychomycosis of right great  toenail  Psych: Good eye contact, normal affect. Memory intact not anxious or depressed appearing.  CNS: CN 2-12 intact, power, tone and sensation normal throughout.        Assessment & Plan:

## 2011-06-25 NOTE — Patient Instructions (Addendum)
F/u as before.  You are being treated for acute bronchitis , penicillin and decongestants are sent to your pharmacy.  You have new medication sent in for fungal toenal i infection.  Toradol and depo medrol in the office today for back pain.  Labs as before , end December for January visit You are referred for a scan of your abdomen

## 2011-06-25 NOTE — Assessment & Plan Note (Signed)
Right great toenail, had removal of nail approx 4 months, still  Having probs, recently had septra , now has naftin, will prescribe terbinafine

## 2011-06-25 NOTE — Assessment & Plan Note (Signed)
Uncontrolled, anti inflammatories in office  

## 2011-07-01 NOTE — Assessment & Plan Note (Signed)
Unchanged, counseled to quit , no commitment at this  time 

## 2011-07-01 NOTE — Assessment & Plan Note (Signed)
Refer for scan to further  eval

## 2011-07-01 NOTE — Assessment & Plan Note (Signed)
Antibiotics prescribed at this time

## 2011-07-01 NOTE — Assessment & Plan Note (Signed)
Controlled by diet only at this time

## 2011-07-02 ENCOUNTER — Telehealth: Payer: Self-pay | Admitting: Family Medicine

## 2011-07-02 DIAGNOSIS — I10 Essential (primary) hypertension: Secondary | ICD-10-CM

## 2011-07-02 NOTE — Telephone Encounter (Signed)
Faxed to lab. Patient aware

## 2011-07-05 ENCOUNTER — Other Ambulatory Visit: Payer: Self-pay | Admitting: Family Medicine

## 2011-07-05 ENCOUNTER — Ambulatory Visit (HOSPITAL_COMMUNITY)
Admission: RE | Admit: 2011-07-05 | Discharge: 2011-07-05 | Disposition: A | Discharge status: Home / Self Care | Payer: Medicare Other | Source: Ambulatory Visit | Attending: Family Medicine | Admitting: Family Medicine

## 2011-07-05 DIAGNOSIS — R1900 Intra-abdominal and pelvic swelling, mass and lump, unspecified site: Secondary | ICD-10-CM

## 2011-07-05 DIAGNOSIS — R0602 Shortness of breath: Secondary | ICD-10-CM | POA: Insufficient documentation

## 2011-07-05 DIAGNOSIS — K7689 Other specified diseases of liver: Secondary | ICD-10-CM | POA: Insufficient documentation

## 2011-07-05 DIAGNOSIS — R911 Solitary pulmonary nodule: Secondary | ICD-10-CM

## 2011-07-05 DIAGNOSIS — R109 Unspecified abdominal pain: Secondary | ICD-10-CM | POA: Insufficient documentation

## 2011-07-05 DIAGNOSIS — N200 Calculus of kidney: Secondary | ICD-10-CM | POA: Insufficient documentation

## 2011-07-05 DIAGNOSIS — R918 Other nonspecific abnormal finding of lung field: Secondary | ICD-10-CM | POA: Insufficient documentation

## 2011-07-05 MED ORDER — IOHEXOL 300 MG/ML  SOLN
100.0000 mL | Freq: Once | INTRAMUSCULAR | Status: AC | PRN
  Administered 2011-07-05: 100 mL via INTRAVENOUS

## 2011-07-10 ENCOUNTER — Encounter (HOSPITAL_COMMUNITY): Payer: Self-pay

## 2011-07-10 ENCOUNTER — Ambulatory Visit (HOSPITAL_COMMUNITY)
Admission: RE | Admit: 2011-07-10 | Discharge: 2011-07-10 | Disposition: A | Discharge status: Home / Self Care | Payer: Medicare Other | Source: Ambulatory Visit | Attending: Family Medicine | Admitting: Family Medicine

## 2011-07-10 DIAGNOSIS — E119 Type 2 diabetes mellitus without complications: Secondary | ICD-10-CM | POA: Insufficient documentation

## 2011-07-10 DIAGNOSIS — J449 Chronic obstructive pulmonary disease, unspecified: Secondary | ICD-10-CM | POA: Insufficient documentation

## 2011-07-10 DIAGNOSIS — I10 Essential (primary) hypertension: Secondary | ICD-10-CM | POA: Insufficient documentation

## 2011-07-10 DIAGNOSIS — R911 Solitary pulmonary nodule: Secondary | ICD-10-CM

## 2011-07-10 DIAGNOSIS — J984 Other disorders of lung: Secondary | ICD-10-CM | POA: Insufficient documentation

## 2011-07-10 DIAGNOSIS — J4489 Other specified chronic obstructive pulmonary disease: Secondary | ICD-10-CM | POA: Insufficient documentation

## 2011-07-10 NOTE — Progress Notes (Signed)
[name] has scheduled pt appt for 07/10/2011 5:00. Pt aware

## 2011-07-11 ENCOUNTER — Other Ambulatory Visit: Payer: Self-pay | Admitting: Family Medicine

## 2011-07-11 DIAGNOSIS — R911 Solitary pulmonary nodule: Secondary | ICD-10-CM

## 2011-08-17 LAB — CBC WITH DIFFERENTIAL/PLATELET
Basophils Absolute: 0.1 10*3/uL (ref 0.0–0.1)
Basophils Relative: 1 % (ref 0–1)
Eosinophils Relative: 4 % (ref 0–5)
HCT: 38.6 % (ref 36.0–46.0)
Hemoglobin: 13.2 g/dL (ref 12.0–15.0)
MCH: 30 pg (ref 26.0–34.0)
MCHC: 34.2 g/dL (ref 30.0–36.0)
MCV: 87.7 fL (ref 78.0–100.0)
Monocytes Absolute: 0.9 10*3/uL (ref 0.1–1.0)
Monocytes Relative: 11 % (ref 3–12)
Neutro Abs: 4.2 10*3/uL (ref 1.7–7.7)
RDW: 14.8 % (ref 11.5–15.5)

## 2011-08-17 LAB — HEMOGLOBIN A1C: Mean Plasma Glucose: 131 mg/dL — ABNORMAL HIGH (ref <117)

## 2011-08-17 LAB — LIPID PANEL
HDL: 40 mg/dL (ref >39)
Total CHOL/HDL Ratio: 4.9 Ratio
VLDL: 20 mg/dL (ref 0–40)

## 2011-08-18 LAB — TSH: TSH: 0.983 u[IU]/mL (ref 0.350–4.500)

## 2011-08-22 ENCOUNTER — Encounter: Payer: Self-pay | Admitting: Family Medicine

## 2011-08-22 ENCOUNTER — Encounter: Payer: Medicare Other | Admitting: Family Medicine

## 2011-08-23 ENCOUNTER — Encounter: Payer: Self-pay | Admitting: Family Medicine

## 2011-08-23 ENCOUNTER — Ambulatory Visit (INDEPENDENT_AMBULATORY_CARE_PROVIDER_SITE_OTHER): Payer: Medicare Other | Admitting: Family Medicine

## 2011-08-23 VITALS — BP 124/84 | HR 103 | Resp 16 | Ht 66.0 in | Wt 218.0 lb

## 2011-08-23 DIAGNOSIS — R5381 Other malaise: Secondary | ICD-10-CM

## 2011-08-23 DIAGNOSIS — R5383 Other fatigue: Secondary | ICD-10-CM

## 2011-08-23 DIAGNOSIS — E119 Type 2 diabetes mellitus without complications: Secondary | ICD-10-CM

## 2011-08-23 DIAGNOSIS — IMO0002 Reserved for concepts with insufficient information to code with codable children: Secondary | ICD-10-CM

## 2011-08-23 DIAGNOSIS — J449 Chronic obstructive pulmonary disease, unspecified: Secondary | ICD-10-CM

## 2011-08-23 DIAGNOSIS — E785 Hyperlipidemia, unspecified: Secondary | ICD-10-CM

## 2011-08-23 DIAGNOSIS — H538 Other visual disturbances: Secondary | ICD-10-CM

## 2011-08-23 DIAGNOSIS — I10 Essential (primary) hypertension: Secondary | ICD-10-CM

## 2011-08-23 DIAGNOSIS — E669 Obesity, unspecified: Secondary | ICD-10-CM

## 2011-08-23 MED ORDER — HYDROCHLOROTHIAZIDE 25 MG PO TABS: 25.0000 mg | ORAL_TABLET | Freq: Every day | ORAL | Status: DC

## 2011-08-23 MED ORDER — POTASSIUM CHLORIDE CRYS ER 20 MEQ PO TBCR: 20.0000 meq | EXTENDED_RELEASE_TABLET | Freq: Every day | ORAL | Status: DC

## 2011-08-23 NOTE — Patient Instructions (Addendum)
F/U in 4 month.  It is important that you exercise regularly at least 30 minutes 5 times a week. If you develop chest pain, have severe difficulty breathing, or feel very tired, stop exercising immediately and seek medical attention    A healthy diet is rich in fruit, vegetables and whole grains. Poultry fish, nuts and beans are a healthy choice for protein rather then red meat. A low sodium diet and drinking 64 ounces of water daily is generally recommended. Oils and sweet should be limited. Carbohydrates especially for those who are diabetic or overweight, should be limited to 30-45 gram per meal. It is important to eat on a regular schedule, at least 3 times daily. Snacks should be primarily fruits, vegetables or nuts.\   You need to stop smoking, you have nodules in your left lung, and a rept scan is scheduled of your lung   HBA`1C , fasting lipid, hepatic and chem 7 in 4.5 month   You are referred to Dr Nile Riggs.  All the best with class

## 2011-08-24 ENCOUNTER — Other Ambulatory Visit: Payer: Self-pay | Admitting: Family Medicine

## 2011-08-25 DIAGNOSIS — J449 Chronic obstructive pulmonary disease, unspecified: Secondary | ICD-10-CM | POA: Insufficient documentation

## 2011-08-25 NOTE — Assessment & Plan Note (Signed)
Diet controlled only, pt no longer needs to test at home, she understands the need to continue to follow a low carb diet

## 2011-08-25 NOTE — Assessment & Plan Note (Signed)
Worsening due to ongoing nicotine use , counseled to quit, pt also has lung nodules

## 2011-08-25 NOTE — Progress Notes (Signed)
  Subjective:    Patient ID: [name], female    DOB: [birth year], 72 y.o.   MRN: [id number]  HPI The PT is here for follow up and re-evaluation of chronic medical conditions, medication management and review of any available recent lab and radiology data.  Preventive health is updated, specifically  Cancer screening and Immunization.   Questions or concerns regarding consultations or procedures which the PT has had in the interim are  addressed. The PT denies any adverse reactions to current medications since the last visit.  C/o blurry vision in right eye, which is new, is noticing this since she is in class. Still smokes 10 ciggs per day, has rept scan due in the next 2 month    Review of Systems See HPI Denies recent fever or chills. Denies sinus pressure, nasal congestion, ear pain or sore throat.She does note increased clear nasal drainage with Winter  Denies chest congestion, productive cough experiences intermittent wheezing and shortness of breath Denies chest pains, palpitations and leg swelling Denies abdominal pain, nausea, vomiting,diarrhea or constipation.   Denies dysuria, frequency, hesitancy or incontinence. Chronic  joint pain, no significant  limitation in mobility. Denies headaches, seizures, numbness, or tingling. Denies depression, anxiety or insomnia. Denies skin break down or rash.        Objective:   Physical Exam Patient alert and oriented and in no cardiopulmonary distress.  HEENT: No facial asymmetry, EOMI, no sinus tenderness,  oropharynx pink and moist.  Neck supple no adenopathy.  Chest: Clear to auscultation bilaterally.Decreased air entry bilaterally  CVS: S1, S2 no murmurs, no S3.  ABD: Soft non tender. Bowel sounds normal.  Ext: No edema  MS: Adequate though reduced  ROM spine, shoulders, hips and knees.  Skin: Intact, no ulcerations or rash noted.  Psych: Good eye contact, normal affect. Memory intact not anxious or depressed  appearing.  CNS: CN 2-12 intact, power, tone and sensation normal throughout.        Assessment & Plan:

## 2011-08-25 NOTE — Assessment & Plan Note (Signed)
New complaint refer for eye exam

## 2011-08-25 NOTE — Assessment & Plan Note (Signed)
Continue muscle relaxant and pain med as needed

## 2011-09-12 ENCOUNTER — Encounter: Payer: Self-pay | Admitting: Family Medicine

## 2011-09-12 ENCOUNTER — Ambulatory Visit (INDEPENDENT_AMBULATORY_CARE_PROVIDER_SITE_OTHER): Payer: Medicare Other | Admitting: Family Medicine

## 2011-09-12 VITALS — BP 132/72 | HR 80 | Resp 18 | Ht 66.0 in | Wt 223.1 lb

## 2011-09-12 DIAGNOSIS — J309 Allergic rhinitis, unspecified: Secondary | ICD-10-CM

## 2011-09-12 DIAGNOSIS — R519 Headache, unspecified: Secondary | ICD-10-CM | POA: Insufficient documentation

## 2011-09-12 DIAGNOSIS — R51 Headache: Secondary | ICD-10-CM

## 2011-09-12 DIAGNOSIS — E119 Type 2 diabetes mellitus without complications: Secondary | ICD-10-CM

## 2011-09-12 DIAGNOSIS — J019 Acute sinusitis, unspecified: Secondary | ICD-10-CM

## 2011-09-12 DIAGNOSIS — I10 Essential (primary) hypertension: Secondary | ICD-10-CM

## 2011-09-12 MED ORDER — SULFAMETHOXAZOLE-TRIMETHOPRIM 800-160 MG PO TABS: 1.0000 | ORAL_TABLET | Freq: Two times a day (BID) | ORAL | Status: AC

## 2011-09-12 MED ORDER — FLUCONAZOLE 150 MG PO TABS: ORAL_TABLET | ORAL | Status: AC

## 2011-09-12 MED ORDER — KETOROLAC TROMETHAMINE 60 MG/2ML IJ SOLN
60.0000 mg | Freq: Once | INTRAMUSCULAR | Status: AC
  Administered 2011-09-12: 60 mg via INTRAMUSCULAR

## 2011-09-12 MED ORDER — CEFTRIAXONE SODIUM 1 G IJ SOLR
500.0000 mg | Freq: Once | INTRAMUSCULAR | Status: AC
  Administered 2011-09-12: 500 mg via INTRAMUSCULAR

## 2011-09-12 MED ORDER — METHYLPREDNISOLONE ACETATE 80 MG/ML IJ SUSP
80.0000 mg | Freq: Once | INTRAMUSCULAR | Status: AC
  Administered 2011-09-12: 80 mg via INTRAMUSCULAR

## 2011-09-12 NOTE — Progress Notes (Signed)
  Subjective:    Patient ID: [name], female    DOB: Feb 06, 1941, 71 y.o.   MRN: [id number]  HPI 1 week h/o increased sinus pressure, with thick yellow nasal drainage, chills, intermittent fever and frontal headache.She denies ear pain, sore throat or productive cough. [name] also c/o increased and uncontrolled allergy symptoms in the past month, nasal congestion with clear drainage. She denies polyuria, polydipsia or blurred vision   Review of Systems See HPI Denies chest pains, palpitations and leg swelling Denies abdominal pain, nausea, vomiting,diarrhea or constipation.   Denies dysuria, frequency, hesitancy or incontinence. Denies joint pain, swelling and limitation in mobility. Denies headaches, seizures, numbness, or tingling. Denies depression, anxiety or insomnia. Denies skin break down or rash.        Objective:   Physical Exam Patient alert and oriented and in no cardiopulmonary distress.  HEENT: No facial asymmetry, EOMI,frontal sinus tenderness,  oropharynx pink and moist.  Neck supple no adenopathy.Erythema and edema of nasal mucosa  Chest: Clear to auscultation bilaterally.  CVS: S1, S2 no murmurs, no S3.  ABD: Soft non tender. Bowel sounds normal.  Ext: No edema  Laura: Adequate ROM spine, shoulders, hips and knees.  Skin: Intact, no ulcerations or rash noted.  Psych: Good eye contact, normal affect. Memory intact not anxious or depressed appearing.  CNS: CN 2-12 intact, power, tone and sensation normal throughout.        Assessment & Plan:

## 2011-09-12 NOTE — Patient Instructions (Signed)
F/u as before.  You are being treated for uncontrolled allergies, sinusitis and headache.  Toradol 60mg  Im in the office , depo medrol 80mg  and Rocephin 500mg  will be administered in the office and medication is sent to the pharmacy.  Use your flonase spray every day, also flush your nostrils with saline /salt water twice daily to help the symptoms.

## 2011-09-17 NOTE — Assessment & Plan Note (Signed)
Controlled, no change in medication  

## 2011-09-17 NOTE — Assessment & Plan Note (Signed)
Uncontrolled, pt re educated about the need for daily use of medication

## 2011-09-17 NOTE — Assessment & Plan Note (Signed)
Toradol administered in office for same

## 2011-09-17 NOTE — Assessment & Plan Note (Signed)
antibiotics  Administered and prescribed

## 2011-10-02 ENCOUNTER — Encounter (HOSPITAL_COMMUNITY)
Admission: RE | Disposition: A | Discharge status: Home / Self Care | Payer: Self-pay | Source: Ambulatory Visit | Attending: Ophthalmology

## 2011-10-02 ENCOUNTER — Ambulatory Visit (HOSPITAL_COMMUNITY)
Admission: RE | Admit: 2011-10-02 | Discharge: 2011-10-02 | Disposition: A | Discharge status: Home / Self Care | Payer: Medicare Other | Source: Ambulatory Visit | Attending: Ophthalmology | Admitting: Ophthalmology

## 2011-10-02 DIAGNOSIS — H26499 Other secondary cataract, unspecified eye: Secondary | ICD-10-CM | POA: Insufficient documentation

## 2011-10-02 SURGERY — TREATMENT, USING YAG LASER
Anesthesia: LOCAL | Laterality: Right

## 2011-10-02 MED ORDER — TROPICAMIDE 1 % OP SOLN
OPHTHALMIC | Status: AC
  Administered 2011-10-02: 1 [drp] via OPHTHALMIC
  Filled 2011-10-02: qty 3

## 2011-10-02 MED ORDER — TROPICAMIDE 1 % OP SOLN
1.0000 [drp] | Freq: Two times a day (BID) | OPHTHALMIC | Status: DC
  Administered 2011-10-02 (×2): 1 [drp] via OPHTHALMIC

## 2011-10-02 NOTE — Brief Op Note (Signed)
[name] 10/02/2011  [name] T. Nile Riggs, MD  Yag Laser Self Test Completedyes. Procedure: Posterior Capsulotomy, right eye.   Laser In Use Sign on Door yes.  Laser: Nd:YAG Spot Size: Fixed Burst Mode: III Power Setting: 3.7 mJ/burst  Number of shots: 35 Total energy delivered: 122 mJ  Patency of the peripheral iridotomy was confirmed visually.  The patient tolerated the procedure without difficulty. No complications were encountered.    The patient was discharged home with the instructions to continue all her current glaucoma medications, if any.   Patient instructed to go to office at 0100 for intraocular pressure check.  Patient verbalizes understanding of discharge instructions yes.

## 2011-10-02 NOTE — H&P (Signed)
The patient was re examined and there is no change in the patients condition since the original H and P. 

## 2011-10-02 NOTE — Discharge Instructions (Signed)
[name]  10/02/2011     Instructions    Activity: No Restrictions.   Diet: Resume Diet you were on at home.   Pain Medication: Tylenol if Needed.   CONTACT YOUR DOCTOR IF YOU HAVE PAIN, REDNESS IN YOUR EYE, OR DECREASED VISION.   Follow-up:today between 2:00-3:00 Nile Riggs, MD.   Dr. Lahoma Crocker: [phone]  Dr. Lita Mains: [phone]  Dr. Alto Denver: [phone]   If you find that you cannot contact your physician, but feel that your signs and   Symptoms warrant a physician's attention, call the Emergency Room at   [phone].   Other   [name]  10/02/2011     Instructions    Activity: No Restrictions.   Diet: Resume Diet you were on at home.   Pain Medication: Tylenol if Needed.   CONTACT YOUR DOCTOR IF YOU HAVE PAIN, REDNESS IN YOUR EYE, OR DECREASED VISION.   Follow-up today between 2:00-3:00 with [name], MD.   Dr. Lahoma Crocker: [phone]  Dr. Lita Mains: [phone]  Dr. Alto Denver: [phone]   If you find that you cannot contact your physician, but feel that your signs and   Symptoms warrant a physician's attention, call the Emergency Room at   [phone].   Marland Kitchen

## 2011-10-26 ENCOUNTER — Encounter (HOSPITAL_COMMUNITY): Payer: Self-pay

## 2011-10-30 ENCOUNTER — Encounter (HOSPITAL_COMMUNITY): Admission: RE | Payer: Self-pay | Source: Ambulatory Visit

## 2011-10-30 ENCOUNTER — Ambulatory Visit (HOSPITAL_COMMUNITY): Admission: RE | Admit: 2011-10-30 | Payer: Medicare Other | Source: Ambulatory Visit | Admitting: Ophthalmology

## 2011-10-30 SURGERY — TREATMENT, USING YAG LASER
Anesthesia: LOCAL | Site: Eye | Laterality: Left

## 2011-11-15 ENCOUNTER — Other Ambulatory Visit: Payer: Self-pay | Admitting: Family Medicine

## 2011-12-24 ENCOUNTER — Ambulatory Visit (INDEPENDENT_AMBULATORY_CARE_PROVIDER_SITE_OTHER): Payer: Medicare Other | Admitting: Family Medicine

## 2011-12-24 ENCOUNTER — Encounter: Payer: Self-pay | Admitting: Family Medicine

## 2011-12-24 VITALS — BP 120/80 | HR 101 | Temp 98.0°F | Resp 18 | Ht 66.0 in | Wt 218.0 lb

## 2011-12-24 DIAGNOSIS — J45909 Unspecified asthma, uncomplicated: Secondary | ICD-10-CM

## 2011-12-24 DIAGNOSIS — E119 Type 2 diabetes mellitus without complications: Secondary | ICD-10-CM

## 2011-12-24 DIAGNOSIS — E785 Hyperlipidemia, unspecified: Secondary | ICD-10-CM

## 2011-12-24 DIAGNOSIS — J40 Bronchitis, not specified as acute or chronic: Secondary | ICD-10-CM

## 2011-12-24 DIAGNOSIS — IMO0002 Reserved for concepts with insufficient information to code with codable children: Secondary | ICD-10-CM

## 2011-12-24 DIAGNOSIS — J45901 Unspecified asthma with (acute) exacerbation: Secondary | ICD-10-CM

## 2011-12-24 DIAGNOSIS — I10 Essential (primary) hypertension: Secondary | ICD-10-CM

## 2011-12-24 DIAGNOSIS — J019 Acute sinusitis, unspecified: Secondary | ICD-10-CM

## 2011-12-24 MED ORDER — KETOROLAC TROMETHAMINE 60 MG/2ML IJ SOLN
60.0000 mg | Freq: Once | INTRAMUSCULAR | Status: AC
  Administered 2011-12-24: 60 mg via INTRAMUSCULAR

## 2011-12-24 MED ORDER — BENZONATATE 100 MG PO CAPS: 100.0000 mg | ORAL_CAPSULE | Freq: Four times a day (QID) | ORAL | Status: DC | PRN

## 2011-12-24 MED ORDER — PENICILLIN V POTASSIUM 500 MG PO TABS: 500.0000 mg | ORAL_TABLET | Freq: Three times a day (TID) | ORAL | Status: AC

## 2011-12-24 MED ORDER — METHYLPREDNISOLONE ACETATE 80 MG/ML IJ SUSP
80.0000 mg | Freq: Once | INTRAMUSCULAR | Status: AC
  Administered 2011-12-24: 80 mg via INTRAMUSCULAR

## 2011-12-24 MED ORDER — CEFTRIAXONE SODIUM 1 G IJ SOLR
500.0000 mg | Freq: Once | INTRAMUSCULAR | Status: AC
  Administered 2011-12-24: 500 mg via INTRAMUSCULAR

## 2011-12-24 MED ORDER — FLUTICASONE PROPIONATE 50 MCG/ACT NA SUSP: 2.0000 | Freq: Every day | NASAL | Status: DC

## 2011-12-24 NOTE — Patient Instructions (Addendum)
F/u as before    You are being treated for sinusitis , bronchitis an acute back pain.  You will get toradol for back pain You will get depo medrol  And rocephin for sinusitis and bronchitis  Call if no improvement  You will get mucinex DM one daily for next 6 days

## 2011-12-25 DIAGNOSIS — J209 Acute bronchitis, unspecified: Secondary | ICD-10-CM | POA: Insufficient documentation

## 2011-12-25 NOTE — Progress Notes (Signed)
  Subjective:    Patient ID: [name], female    DOB: January 01, 1941, 71 y.o.   MRN: [id number]  HPI 4 day h/o head and chest congestion intermittent chills, no documented fever. Sputum is thick and yellow, nasal drainage is also yellow. Experiencing increased shortness of breath with activity and some wheezing at times, still smokes and not planning to set a quit date in the near future. 1 day h/o increased back pain radiatng to hips worse with movement, she has established arthritis in her back   Review of Systems See HPI Denies chest pains, palpitations and leg swelling Denies abdominal pain, nausea, vomiting,diarrhea or constipation.   Denies dysuria, frequency, hesitancy or incontinence.  Denies headaches, seizures, numbness, or tingling. Denies depression, anxiety or insomnia. Denies skin break down or rash.        Objective:   Physical Exam Patient alert and oriented and in no cardiopulmonary distress.  HEENT: No facial asymmetry, EOMI, maxillary  sinus tenderness,  oropharynx pink and moist.  Neck supple no adenopathy.  Chest: decreased air entry scattered crackles and wheezes  CVS: S1, S2 no murmurs, no S3.  ABD: Soft non tender. Bowel sounds normal.  Ext: No edema  MS: decreased ROM spine,adequate in  shoulders, hips and knees.  Skin: Intact, no ulcerations or rash noted.  Psych: Good eye contact, normal affect. Memory intact not anxious or depressed appearing.  CNS: CN 2-12 intact, power, tone and sensation normal throughout.        Assessment & Plan:

## 2011-12-25 NOTE — Assessment & Plan Note (Signed)
Antibiotic course prescribed 

## 2011-12-25 NOTE — Assessment & Plan Note (Signed)
Diet controlled.  

## 2011-12-25 NOTE — Assessment & Plan Note (Addendum)
Uncontrolled and increased toradol and depo medrol  in office

## 2011-12-25 NOTE — Assessment & Plan Note (Signed)
Rocephin l administered and antibiotics prescribed

## 2011-12-25 NOTE — Assessment & Plan Note (Signed)
Controlled, no change in medication  

## 2011-12-25 NOTE — Assessment & Plan Note (Signed)
Uncontrolled, low fat diet discussed and encouraged 

## 2012-01-12 LAB — BASIC METABOLIC PANEL
Chloride: 104 mEq/L (ref 96–112)
Potassium: 4.7 mEq/L (ref 3.5–5.3)
Sodium: 141 mEq/L (ref 135–145)

## 2012-01-12 LAB — HEPATIC FUNCTION PANEL
ALT: 9 U/L (ref 0–35)
AST: 12 U/L (ref 0–37)
Alkaline Phosphatase: 100 U/L (ref 39–117)
Bilirubin, Direct: 0.1 mg/dL (ref 0.0–0.3)
Total Bilirubin: 0.6 mg/dL (ref 0.3–1.2)

## 2012-01-12 LAB — LIPID PANEL
Total CHOL/HDL Ratio: 3.6 Ratio
VLDL: 18 mg/dL (ref 0–40)

## 2012-01-17 ENCOUNTER — Ambulatory Visit (INDEPENDENT_AMBULATORY_CARE_PROVIDER_SITE_OTHER): Payer: Medicare Other | Admitting: Family Medicine

## 2012-01-17 ENCOUNTER — Encounter: Payer: Self-pay | Admitting: Family Medicine

## 2012-01-17 VITALS — BP 140/70 | HR 100 | Resp 18 | Ht 66.0 in | Wt 214.0 lb

## 2012-01-17 DIAGNOSIS — I10 Essential (primary) hypertension: Secondary | ICD-10-CM

## 2012-01-17 DIAGNOSIS — R7303 Prediabetes: Secondary | ICD-10-CM

## 2012-01-17 DIAGNOSIS — E669 Obesity, unspecified: Secondary | ICD-10-CM

## 2012-01-17 DIAGNOSIS — R7309 Other abnormal glucose: Secondary | ICD-10-CM

## 2012-01-17 DIAGNOSIS — J449 Chronic obstructive pulmonary disease, unspecified: Secondary | ICD-10-CM

## 2012-01-17 DIAGNOSIS — E785 Hyperlipidemia, unspecified: Secondary | ICD-10-CM

## 2012-01-17 NOTE — Assessment & Plan Note (Signed)
Sub optimal control, no change in medication 

## 2012-01-17 NOTE — Progress Notes (Signed)
  Subjective:    Patient ID: [name], female    DOB: 08-Dec-1940, 71 y.o.   MRN: [id number]  HPI The PT is here for follow up and re-evaluation of chronic medical conditions, medication management and review of any available recent lab and radiology data.  Preventive health is updated, specifically  Cancer screening and Immunization.   Questions or concerns regarding consultations or procedures which the PT has had in the interim are  addressed. The PT denies any adverse reactions to current medications since the last visit.  There are no new concerns.  There are no specific complaints       Review of Systems See HPI Denies recent fever or chills. Denies sinus pressure, nasal congestion, ear pain or sore throat. Denies chest congestion, productive cough or wheezing. Denies chest pains, palpitations and leg swelling Denies abdominal pain, nausea, vomiting,diarrhea or constipation.   Denies dysuria, frequency, hesitancy or incontinence. Denies joint pain, swelling and limitation in mobility. Denies headaches, seizures, numbness, or tingling. Denies depression, anxiety or insomnia. Denies skin break down or rash.        Objective:   Physical Exam Patient alert and oriented and in no cardiopulmonary distress.  HEENT: No facial asymmetry, EOMI, no sinus tenderness,  oropharynx pink and moist.  Neck supple no adenopathy.  Chest: Clear to auscultation bilaterally.Decreased air entry throughout  CVS: S1, S2 no murmurs, no S3.  ABD: Soft non tender. Bowel sounds normal.  Ext: No edema  MS: Adequate ROM spine, shoulders, hips and knees.  Skin: Intact, no ulcerations or rash noted.  Psych: Good eye contact, normal affect. Memory intact not anxious or depressed appearing.  CNS: CN 2-12 intact, power, tone and sensation normal throughout.        Assessment & Plan:

## 2012-01-17 NOTE — Assessment & Plan Note (Signed)
Hyperlipidemia:Low fat diet discussed and encouraged.  Improved , no change in medication

## 2012-01-17 NOTE — Assessment & Plan Note (Signed)
deteriorating due to ongoing nicotine use 

## 2012-01-17 NOTE — Assessment & Plan Note (Signed)
Improved. Pt applauded on succesful weight loss through lifestyle change, and encouraged to continue same. Weight loss goal set for the next several months.  

## 2012-01-17 NOTE — Patient Instructions (Addendum)
F/U in 4 months with rectal exam, call if you need me before  Congrats on weight loss  Mammogram due July 3 or after, please call and make your appt.  Blood sugar is continuing to improve, congrats and keep it up!   Weight loss goal of 5  To 8 weeks  You need the  Shingles vaccine please check with your insurance about this

## 2012-01-17 NOTE — Assessment & Plan Note (Signed)
Improved with dietary change 

## 2012-01-21 ENCOUNTER — Other Ambulatory Visit: Payer: Self-pay | Admitting: Family Medicine

## 2012-01-21 DIAGNOSIS — Z139 Encounter for screening, unspecified: Secondary | ICD-10-CM

## 2012-01-25 ENCOUNTER — Ambulatory Visit (HOSPITAL_COMMUNITY)
Admission: RE | Admit: 2012-01-25 | Discharge: 2012-01-25 | Disposition: A | Discharge status: Home / Self Care | Payer: Medicare Other | Source: Ambulatory Visit | Attending: Family Medicine | Admitting: Family Medicine

## 2012-01-25 DIAGNOSIS — Z139 Encounter for screening, unspecified: Secondary | ICD-10-CM

## 2012-01-25 DIAGNOSIS — Z1231 Encounter for screening mammogram for malignant neoplasm of breast: Secondary | ICD-10-CM | POA: Insufficient documentation

## 2012-03-19 ENCOUNTER — Encounter (HOSPITAL_COMMUNITY): Payer: Self-pay

## 2012-03-19 ENCOUNTER — Telehealth: Payer: Self-pay | Admitting: Family Medicine

## 2012-03-19 ENCOUNTER — Emergency Department (HOSPITAL_COMMUNITY): Payer: Medicare Other

## 2012-03-19 ENCOUNTER — Emergency Department (HOSPITAL_COMMUNITY)
Admission: EM | Admit: 2012-03-19 | Discharge: 2012-03-19 | Disposition: A | Discharge status: Home / Self Care | Payer: Medicare Other | Attending: Emergency Medicine | Admitting: Emergency Medicine

## 2012-03-19 DIAGNOSIS — J449 Chronic obstructive pulmonary disease, unspecified: Secondary | ICD-10-CM | POA: Insufficient documentation

## 2012-03-19 DIAGNOSIS — M1710 Unilateral primary osteoarthritis, unspecified knee: Secondary | ICD-10-CM | POA: Insufficient documentation

## 2012-03-19 DIAGNOSIS — F17200 Nicotine dependence, unspecified, uncomplicated: Secondary | ICD-10-CM | POA: Insufficient documentation

## 2012-03-19 DIAGNOSIS — J4489 Other specified chronic obstructive pulmonary disease: Secondary | ICD-10-CM | POA: Insufficient documentation

## 2012-03-19 DIAGNOSIS — E119 Type 2 diabetes mellitus without complications: Secondary | ICD-10-CM | POA: Insufficient documentation

## 2012-03-19 DIAGNOSIS — J329 Chronic sinusitis, unspecified: Secondary | ICD-10-CM

## 2012-03-19 DIAGNOSIS — I10 Essential (primary) hypertension: Secondary | ICD-10-CM | POA: Insufficient documentation

## 2012-03-19 MED ORDER — FEXOFENADINE-PSEUDOEPHED ER 60-120 MG PO TB12: 1.0000 | ORAL_TABLET | Freq: Two times a day (BID) | ORAL | Status: DC

## 2012-03-19 MED ORDER — AMOXICILLIN 500 MG PO CAPS: 500.0000 mg | ORAL_CAPSULE | Freq: Three times a day (TID) | ORAL | Status: AC

## 2012-03-19 NOTE — ED Notes (Signed)
Alert, NAd, No distress, Cough since Saturday with yellow sputum.

## 2012-03-19 NOTE — ED Notes (Signed)
Cough, head and chest congestion. Coughing up chunks of yellow stuff per pt.

## 2012-03-20 NOTE — ED Provider Notes (Signed)
History     CSN: [id number]  Arrival date & time 03/19/12  1904   First MD Initiated Contact with Patient 03/19/12 2021      Chief Complaint  Patient presents with  . Cough  . Nasal Congestion    (Consider location/radiation/quality/duration/timing/severity/associated sxs/prior treatment) HPI Comments: [name] presents with a 4 day history cough which has been productive of chunky yellow sputum,  Nasal congestion with thick yellow nasal discharge accompanied by post nasal drip.  She also describes facial pain and pressure,  But denies headache,  Fever and chills.  She also denies chest pain and shortness of breath.  She has found no alleviators for her symptoms.    The history is provided by the patient.    Past Medical History  Diagnosis Date  . Sinusitis   . OA (osteoarthritis) of knee   . Nicotine addiction   . COPD (chronic obstructive pulmonary disease)   . Obesity   . Hypertension   . Diabetes mellitus, type 2     Past Surgical History  Procedure Date  . Knee arthroscopy right   . Cholecystectomy   . Total knee arthroplasty right 05/29/2005    Dr. Romeo Apple   . Bilateral cataract surgery 7/27 & 03/01/09    Dr. Nile Riggs      Family History  Problem Relation Age of Onset  . Diabetes Mother   . Hypertension Mother   . Coronary artery disease Mother   . Pneumonia Father   . Hypertension Sister   . Hypertension Sister   . Hypertension Brother   . Hypertension Brother     History  Substance Use Topics  . Smoking status: Current Everyday Smoker -- 0.3 packs/day    Types: Cigarettes  . Smokeless tobacco: Not on file  . Alcohol Use: No    OB History    Grav Para Term Preterm Abortions TAB SAB Ect Mult Living                  Review of Systems  Constitutional: Negative for fever and fatigue.  HENT: Positive for congestion, facial swelling, rhinorrhea, postnasal drip and sinus pressure. Negative for ear pain, sore throat, neck pain, neck stiffness,  voice change and tinnitus.   Eyes: Negative.   Respiratory: Positive for cough. Negative for chest tightness, shortness of breath, wheezing and stridor.   Cardiovascular: Negative for chest pain.  Gastrointestinal: Negative for nausea and abdominal pain.  Genitourinary: Negative.   Musculoskeletal: Negative for joint swelling and arthralgias.  Skin: Negative.  Negative for rash and wound.  Neurological: Negative for dizziness, weakness, light-headedness, numbness and headaches.  Hematological: Negative.   Psychiatric/Behavioral: Negative.     Allergies  Review of patient's allergies indicates no known allergies.  Home Medications   Current Outpatient Rx  Name Route Sig Dispense Refill  . ALBUTEROL SULFATE HFA 108 (90 BASE) MCG/ACT IN AERS Inhalation Inhale 2 puffs into the lungs every 6 (six) hours as needed. Inhale 2 puffs every 6 to 8 hours as needed for wheezing 1 Inhaler 3  . ALOE VERA 5000 MG PO CAPS Oral Take 1 capsule by mouth daily.     . ASPIRIN 81 MG PO TABS Oral Take 81 mg by mouth daily.      Marland Kitchen FLUTICASONE PROPIONATE 50 MCG/ACT NA SUSP Nasal Place 2 sprays into the nose daily. 16 g 2  . IPRATROPIUM-ALBUTEROL 0.5-2.5 (3) MG/3ML IN SOLN Nebulization Take 3 mLs by nebulization 3 (three) times daily. 360 mL 3  .  AMOXICILLIN 500 MG PO CAPS Oral Take 1 capsule (500 mg total) by mouth 3 (three) times daily. 30 capsule 0  . FEXOFENADINE-PSEUDOEPHED ER 60-120 MG PO TB12 Oral Take 1 tablet by mouth every 12 (twelve) hours. 30 tablet 0  . FLUTICASONE-SALMETEROL 100-50 MCG/DOSE IN AEPB Inhalation Inhale 1 puff into the lungs 2 (two) times daily.      BP 148/77  Pulse 107  Temp 98.5 F (36.9 C) (Oral)  Resp 16  Ht 5\' 6"  (1.676 m)  SpO2 98%  Physical Exam  Constitutional: She is oriented to person, place, and time. She appears well-developed and well-nourished.  HENT:  Head: Normocephalic and atraumatic.  Right Ear: Tympanic membrane and ear canal normal.  Left Ear: Tympanic  membrane and ear canal normal.  Nose: Mucosal edema and rhinorrhea present. Right sinus exhibits maxillary sinus tenderness.  Mouth/Throat: Uvula is midline, oropharynx is clear and moist and mucous membranes are normal. No oropharyngeal exudate, posterior oropharyngeal edema, posterior oropharyngeal erythema or tonsillar abscesses.  Eyes: Conjunctivae are normal.  Cardiovascular: Normal rate.        Vital signs reviewed.  Not tachycardic on exam.  Pulmonary/Chest: Effort normal and breath sounds normal. No respiratory distress. She has no wheezes. She has no rhonchi. She has no rales.  Abdominal: Soft. There is no tenderness.  Musculoskeletal: Normal range of motion.  Neurological: She is alert and oriented to person, place, and time.  Skin: Skin is warm and dry. No rash noted.  Psychiatric: She has a normal mood and affect.    ED Course  Procedures (including critical care time)  Labs Reviewed - No data to display Dg Chest 2 View  03/19/2012  *RADIOLOGY REPORT*  Clinical Data: Cough and congestion.  CHEST - 2 VIEW  Comparison: CT chest 07/10/2011 PA and lateral chest 11/04/2008.  Findings: Lungs are clear.  Heart size is normal.  No pneumothorax or pleural fluid.  Thoracic degenerative change noted.  IMPRESSION: No acute disease.   Original Report Authenticated By: Bernadene Bell. D'ALESSIO, M.D.      1. Sinusitis       MDM  Pt prescribed allegra d,  Amoxicillin.  Encouraged warm compresses to face, tylenol.  Recheck by pcp if not better over the next 4-5 days,  Sooner for any worsened sx        Burgess Amor, [place] 03/20/12 1633

## 2012-03-20 NOTE — ED Provider Notes (Signed)
Medical screening examination/treatment/procedure(s) were performed by non-physician practitioner and as supervising physician I was immediately available for consultation/collaboration.  Destini Cambre, MD 03/20/12 2257 

## 2012-03-21 NOTE — Telephone Encounter (Signed)
Pt states she went to ER, still coughing bad and feeling rough. Will call back Monday if still feeling bad for appt

## 2012-03-25 ENCOUNTER — Encounter: Payer: Self-pay | Admitting: Family Medicine

## 2012-03-25 ENCOUNTER — Ambulatory Visit (INDEPENDENT_AMBULATORY_CARE_PROVIDER_SITE_OTHER): Payer: Medicare Other | Admitting: Family Medicine

## 2012-03-25 VITALS — BP 152/74 | HR 100 | Temp 98.5°F | Resp 18 | Ht 66.0 in | Wt 209.1 lb

## 2012-03-25 DIAGNOSIS — J209 Acute bronchitis, unspecified: Secondary | ICD-10-CM

## 2012-03-25 DIAGNOSIS — F17200 Nicotine dependence, unspecified, uncomplicated: Secondary | ICD-10-CM

## 2012-03-25 DIAGNOSIS — J449 Chronic obstructive pulmonary disease, unspecified: Secondary | ICD-10-CM

## 2012-03-25 DIAGNOSIS — I10 Essential (primary) hypertension: Secondary | ICD-10-CM

## 2012-03-25 DIAGNOSIS — K1370 Unspecified lesions of oral mucosa: Secondary | ICD-10-CM

## 2012-03-25 DIAGNOSIS — K121 Other forms of stomatitis: Secondary | ICD-10-CM

## 2012-03-25 MED ORDER — CLINDAMYCIN HCL 300 MG PO CAPS: 300.0000 mg | ORAL_CAPSULE | Freq: Three times a day (TID) | ORAL | Status: AC

## 2012-03-25 MED ORDER — BENZONATATE 100 MG PO CAPS: 100.0000 mg | ORAL_CAPSULE | Freq: Four times a day (QID) | ORAL | Status: DC | PRN

## 2012-03-25 MED ORDER — FIRST-DUKES MOUTHWASH MT SUSP: OROMUCOSAL | Status: DC

## 2012-03-25 NOTE — Telephone Encounter (Signed)
Pt walked in this am for injection. States she is still taking the antibiotics (PCN) and has a few days left. Still coughing up thick yellow mucus. Roof of mouth is sore and states its hard to swallow. No appts available all week per Aurea Graff. Pt states she just wants a shot. Please advise

## 2012-03-25 NOTE — Telephone Encounter (Signed)
Pt to be seen this am

## 2012-03-25 NOTE — Patient Instructions (Addendum)
F/u as before  Additional antibiotic, cleocin and decongestant tablets are sent in for chest congestion, continue the antibiotic you already have .  You may also use  oragel on the ulcer on the roof of your mouth twice daily for the next 5 days to help with pain

## 2012-03-25 NOTE — Telephone Encounter (Signed)
Work in this morning please

## 2012-04-05 NOTE — Assessment & Plan Note (Signed)
No obvious bacterial superinfection. Topical analgesia and good aural toilet

## 2012-04-05 NOTE — Progress Notes (Signed)
  Subjective:    Patient ID: [name], female    DOB: 1941-06-02, 71 y.o.   MRN: [id number]  HPI Pt in with 1 week h/o chest congestion , cough and yellow sputum with intermittent chills and low grade fever. She was evaluated in the eD , but c/o ongoing symptoms. Denies significant sinus pressure or drainage, but c/o sore spot on upper palpate. Continues to smoke. No commitment to quitting yet    Review of Systems See HPI Denies recent fever or chills. Denies chest pains, palpitations and leg swelling Denies abdominal pain, nausea, vomiting,diarrhea or constipation.   Denies dysuria, frequency, hesitancy or incontinence. Denies joint pain, swelling and limitation in mobility. Denies headaches, seizures, numbness, or tingling. Denies depression, anxiety or insomnia. Denies skin break down or rash.        Objective:   Physical Exam Patient alert and oriented and in no cardiopulmonary distress.  HEENT: No facial asymmetry, EOMI, no sinus tenderness,  oropharynx pink and moist.Ulcer on left upper palate.  Neck supple no adenopathy.  Chest: decreased air entry, scattered crackles and wheezes  CVS: S1, S2 no murmurs, no S3.  ABD: Soft non tender. Bowel sounds normal.  Ext: No edema  MS: Adequate ROM spine, shoulders, hips and knees.  Skin: Intact, no ulcerations or rash noted.  Psych: Good eye contact, normal affect. Memory intact not anxious or depressed appearing.  CNS: CN 2-12 intact, power, tone and sensation normal throughout.        Assessment & Plan:

## 2012-04-05 NOTE — Assessment & Plan Note (Signed)
Worsening symptoms x 1 week, additional coverage added

## 2012-04-05 NOTE — Assessment & Plan Note (Signed)
Worsening due to ongoing nicotine use pt again counseled re the need to quit nicotine

## 2012-04-05 NOTE — Assessment & Plan Note (Signed)

## 2012-04-05 NOTE — Assessment & Plan Note (Signed)
Elevated pressure at this visit , will re evaluate the need for medication at next visit, has been on medication in the past

## 2012-05-20 ENCOUNTER — Ambulatory Visit (INDEPENDENT_AMBULATORY_CARE_PROVIDER_SITE_OTHER): Payer: Medicare Other | Admitting: Family Medicine

## 2012-05-20 ENCOUNTER — Encounter: Payer: Self-pay | Admitting: Family Medicine

## 2012-05-20 VITALS — BP 130/62 | HR 104 | Resp 18 | Ht 66.0 in | Wt 209.0 lb

## 2012-05-20 DIAGNOSIS — E669 Obesity, unspecified: Secondary | ICD-10-CM

## 2012-05-20 DIAGNOSIS — Z23 Encounter for immunization: Secondary | ICD-10-CM

## 2012-05-20 DIAGNOSIS — R7303 Prediabetes: Secondary | ICD-10-CM

## 2012-05-20 DIAGNOSIS — R7309 Other abnormal glucose: Secondary | ICD-10-CM

## 2012-05-20 DIAGNOSIS — Z1211 Encounter for screening for malignant neoplasm of colon: Secondary | ICD-10-CM

## 2012-05-20 DIAGNOSIS — B369 Superficial mycosis, unspecified: Secondary | ICD-10-CM

## 2012-05-20 DIAGNOSIS — F17200 Nicotine dependence, unspecified, uncomplicated: Secondary | ICD-10-CM

## 2012-05-20 DIAGNOSIS — E785 Hyperlipidemia, unspecified: Secondary | ICD-10-CM

## 2012-05-20 LAB — POC HEMOCCULT BLD/STL (OFFICE/1-CARD/DIAGNOSTIC): Fecal Occult Blood, POC: NEGATIVE

## 2012-05-20 MED ORDER — CALCIUM CARBONATE-VITAMIN D 500-200 MG-UNIT PO TABS: 1.0000 | ORAL_TABLET | Freq: Two times a day (BID) | ORAL | Status: DC

## 2012-05-20 MED ORDER — CLOTRIMAZOLE-BETAMETHASONE 1-0.05 % EX CREA: TOPICAL_CREAM | Freq: Two times a day (BID) | CUTANEOUS | Status: DC

## 2012-05-20 MED ORDER — BUPROPION HCL ER (SR) 150 MG PO TB12: 150.0000 mg | ORAL_TABLET | Freq: Two times a day (BID) | ORAL | Status: DC

## 2012-05-20 NOTE — Progress Notes (Signed)
  Subjective:    Patient ID: [name], female    DOB: 13-Aug-1940, 71 y.o.   MRN: [id number]  HPI The PT is here for follow up and re-evaluation of chronic medical conditions, medication management and review of any available recent lab and radiology data.  Preventive health is updated, specifically  Cancer screening and Immunization.   Questions or concerns regarding consultations or procedures which the PT has had in the interim are  addressed. The PT denies any adverse reactions to current medications since the last visit.  Pruritic rash seen on sole of foot, wants medication for this, present for the past 2 weeks       Review of Systems See HPI Denies recent fever or chills. Denies sinus pressure, nasal congestion, ear pain or sore throat. Denies chest congestion, productive cough or wheezing. Denies chest pains, palpitations and leg swelling Denies abdominal pain, nausea, vomiting,diarrhea or constipation.   Denies dysuria, frequency, hesitancy or incontinence. Denies joint pain, swelling and limitation in mobility. Denies headaches, seizures, numbness, or tingling. Denies depression, anxiety or insomnia.       Objective:   Physical Exam  Patient alert and oriented and in no cardiopulmonary distress.  HEENT: No facial asymmetry, EOMI, no sinus tenderness,  oropharynx pink and moist.  Neck supple no adenopathy.  Chest: Clear to auscultation bilaterally.  CVS: S1, S2 no murmurs, no S3.  ABD: Soft non tender. Bowel sounds normal.  Ext: No edema  MS: Adequate ROM spine, shoulders, hips and knees.  Skin: Intact, tinea pedis  Psych: Good eye contact, normal affect. Memory intact not anxious or depressed appearing.  CNS: CN 2-12 intact, power, tone and sensation normal throughout.       Assessment & Plan:

## 2012-05-20 NOTE — Assessment & Plan Note (Addendum)
Smokes 8 to 10 per day, wants to quit, zyban prescribed

## 2012-05-20 NOTE — Patient Instructions (Addendum)
Annual wellness in 4.5 month. Please call if you need me before  Congrats on decision to quit smoking, you need to stop in 2 weeks, start zyban 1 daily for 5 days then increase to one twice daily  Flu vaccine today, and script being given for zosatvax take to pharmacy, you need this  HBa1C today.   New med for rash on right foot,use as needed  It is important that you exercise regularly at least 30 minutes 5 times a week. If you develop chest pain, have severe difficulty breathing, or feel very tired, stop exercising immediately and seek medical attention   A healthy diet is rich in fruit, vegetables and whole grains. Poultry fish, nuts and beans are a healthy choice for protein rather then red meat. A low sodium diet and drinking 64 ounces of water daily is generally recommended. Oils and sweet should be limited. Carbohydrates especially for those who are diabetic or overweight, should be limited to 34-45 gram per meal. It is important to eat on a regular schedule, at least 3 times daily. Snacks should be primarily fruits, vegetables or nuts. Continue to lose weight goal is 2 pounds per month. Rectal today    Fasting lipid,in 4 moth

## 2012-06-01 NOTE — Assessment & Plan Note (Signed)
Improved. Pt applauded on succesful weight loss through lifestyle change, and encouraged to continue same. Weight loss goal set for the next several months.  

## 2012-06-01 NOTE — Assessment & Plan Note (Signed)
Improved, HBA1C is 5.8 Patient educated about the importance of limiting  Carbohydrate intake , the need to commit to daily physical activity for a minimum of 30 minutes , and to commit weight loss. The fact that changes in all these areas will reduce or eliminate all together the development of diabetes is stressed.

## 2012-06-01 NOTE — Assessment & Plan Note (Signed)
Acute flare, topical antifungal prescribed

## 2012-06-01 NOTE — Assessment & Plan Note (Signed)
Hyperlipidemia:Low fat diet discussed and encouraged.  Updated lab next year,  No meds indicated at this time

## 2012-07-29 ENCOUNTER — Encounter: Payer: Self-pay | Admitting: Family Medicine

## 2012-07-29 ENCOUNTER — Ambulatory Visit (INDEPENDENT_AMBULATORY_CARE_PROVIDER_SITE_OTHER): Payer: Medicare Other | Admitting: Family Medicine

## 2012-07-29 ENCOUNTER — Telehealth: Payer: Self-pay | Admitting: Family Medicine

## 2012-07-29 VITALS — BP 138/78 | HR 98 | Temp 98.4°F | Resp 16 | Ht 66.0 in | Wt 211.1 lb

## 2012-07-29 DIAGNOSIS — J45901 Unspecified asthma with (acute) exacerbation: Secondary | ICD-10-CM

## 2012-07-29 DIAGNOSIS — F17200 Nicotine dependence, unspecified, uncomplicated: Secondary | ICD-10-CM

## 2012-07-29 DIAGNOSIS — I10 Essential (primary) hypertension: Secondary | ICD-10-CM

## 2012-07-29 DIAGNOSIS — J019 Acute sinusitis, unspecified: Secondary | ICD-10-CM | POA: Insufficient documentation

## 2012-07-29 DIAGNOSIS — J209 Acute bronchitis, unspecified: Secondary | ICD-10-CM

## 2012-07-29 MED ORDER — PENICILLIN V POTASSIUM 500 MG PO TABS: 500.0000 mg | ORAL_TABLET | Freq: Three times a day (TID) | ORAL | Status: DC

## 2012-07-29 MED ORDER — BENZONATATE 100 MG PO CAPS: 100.0000 mg | ORAL_CAPSULE | Freq: Four times a day (QID) | ORAL | Status: DC | PRN

## 2012-07-29 NOTE — Patient Instructions (Addendum)
F/u as before.  You are being treated for acute sinusitis and bronchitis.  Please keep warm and protect yourself from the cold.  Start welbutrin one twice daily on February 1, and stop using the electric ciggarrete on February 14 please  Labs as before

## 2012-07-29 NOTE — Progress Notes (Signed)
  Subjective:    Patient ID: [name], female    DOB: [birth year], 72 y.o.   MRN: [id number]  HPI  6 day h/o right ear pain, yellow nasal drainage sinus pressure, tender right neck glands and cough productive of yellow sputum Electric cigarette since 12/25, was smoking 10 per day. Quit date of 2/14, and  Will resume wellbutrin 08/23/2012  Review of Systems     Objective:   Physical Exam        Assessment & Plan:

## 2012-07-29 NOTE — Telephone Encounter (Signed)
Patient has been having pain in both ears since she was out in wind Thursday. Advised she make appt

## 2012-08-08 ENCOUNTER — Emergency Department (HOSPITAL_COMMUNITY): Payer: Medicare Other

## 2012-08-08 ENCOUNTER — Emergency Department (HOSPITAL_COMMUNITY)
Admission: EM | Admit: 2012-08-08 | Discharge: 2012-08-08 | Disposition: A | Discharge status: Home / Self Care | Payer: Medicare Other | Attending: Emergency Medicine | Admitting: Emergency Medicine

## 2012-08-08 ENCOUNTER — Encounter (HOSPITAL_COMMUNITY): Payer: Self-pay | Admitting: Emergency Medicine

## 2012-08-08 DIAGNOSIS — J449 Chronic obstructive pulmonary disease, unspecified: Secondary | ICD-10-CM | POA: Insufficient documentation

## 2012-08-08 DIAGNOSIS — M169 Osteoarthritis of hip, unspecified: Secondary | ICD-10-CM | POA: Insufficient documentation

## 2012-08-08 DIAGNOSIS — R0789 Other chest pain: Secondary | ICD-10-CM | POA: Insufficient documentation

## 2012-08-08 DIAGNOSIS — R6883 Chills (without fever): Secondary | ICD-10-CM | POA: Insufficient documentation

## 2012-08-08 DIAGNOSIS — J40 Bronchitis, not specified as acute or chronic: Secondary | ICD-10-CM | POA: Insufficient documentation

## 2012-08-08 DIAGNOSIS — Z7982 Long term (current) use of aspirin: Secondary | ICD-10-CM | POA: Insufficient documentation

## 2012-08-08 DIAGNOSIS — Z79899 Other long term (current) drug therapy: Secondary | ICD-10-CM | POA: Insufficient documentation

## 2012-08-08 DIAGNOSIS — I10 Essential (primary) hypertension: Secondary | ICD-10-CM | POA: Insufficient documentation

## 2012-08-08 DIAGNOSIS — E119 Type 2 diabetes mellitus without complications: Secondary | ICD-10-CM | POA: Insufficient documentation

## 2012-08-08 DIAGNOSIS — E669 Obesity, unspecified: Secondary | ICD-10-CM | POA: Insufficient documentation

## 2012-08-08 DIAGNOSIS — R059 Cough, unspecified: Secondary | ICD-10-CM | POA: Insufficient documentation

## 2012-08-08 DIAGNOSIS — M1610 Unilateral primary osteoarthritis, unspecified hip: Secondary | ICD-10-CM | POA: Insufficient documentation

## 2012-08-08 DIAGNOSIS — Z87891 Personal history of nicotine dependence: Secondary | ICD-10-CM | POA: Insufficient documentation

## 2012-08-08 DIAGNOSIS — R05 Cough: Secondary | ICD-10-CM | POA: Insufficient documentation

## 2012-08-08 DIAGNOSIS — J4489 Other specified chronic obstructive pulmonary disease: Secondary | ICD-10-CM | POA: Insufficient documentation

## 2012-08-08 MED ORDER — HYDROCODONE-ACETAMINOPHEN 5-325 MG PO TABS: 1.0000 | ORAL_TABLET | Freq: Four times a day (QID) | ORAL | Status: AC | PRN

## 2012-08-08 MED ORDER — MOXIFLOXACIN HCL 400 MG PO TABS: 400.0000 mg | ORAL_TABLET | Freq: Every day | ORAL | Status: DC

## 2012-08-08 NOTE — ED Notes (Signed)
Patient brought in via EMS. Alert and oriented. Airway patent. No acute distress noted. Patient c/o productive cough with shortness of breath. Per patient being treated for sinus infection with penicillin but the congestion has now moved down into her lungs. Per patient thick yellow sputum. Patient denies fevers, reports pain in right upper quad with cough due to hernia.

## 2012-08-08 NOTE — ED Provider Notes (Signed)
History  This chart was scribed for Laura Lennert, MD by [name], ED Scribe. This patient was seen in room [facility] and the patient's care was started at 15:07.   CSN: [id number]  Arrival date & time 08/08/12  1343   First MD Initiated Contact with Patient 08/08/12 1507      Chief Complaint  Patient presents with  . Shortness of Breath    (Consider location/radiation/quality/duration/timing/severity/associated sxs/prior Treatment) [name] is a 72 y.o. female brought in by ambulance, who presents to the Emergency Department complaining of a gradually worsening productive cough with yellow phlegm for several days with associated SOB this morning. Patient is a 72 y.o. female presenting with cough. The history is provided by the patient. No language interpreter was used.  Cough This is a new problem. The current episode started more than 2 days ago. The problem occurs every few minutes. The problem has been gradually worsening. The cough is productive of sputum. There has been no fever. Associated symptoms include chest pain, chills, sweats and shortness of breath. Pertinent negatives include no ear congestion, no ear pain, no headaches and no eye redness. Treatments tried: penicilin. The treatment provided no relief. She is a smoker (former). Her past medical history is significant for COPD. Her past medical history does not include pneumonia, emphysema or asthma.  Pt reports some associated feeling hot, diaphoresis, chest soreness, and chills but denies any fever, or blood in her sputum. Dr. Lodema Hurley did give her the flu shot this year.  Past Medical History  Diagnosis Date  . Sinusitis   . OA (osteoarthritis) of knee   . Nicotine addiction   . COPD (chronic obstructive pulmonary disease)   . Obesity   . Hypertension   . Diabetes mellitus, type 2     Past Surgical History  Procedure Date  . Knee arthroscopy right   . Cholecystectomy   . Total knee arthroplasty right  05/29/2005    Dr. Romeo Hurley   . Bilateral cataract surgery 7/27 & 03/01/09    Dr. Nile Hurley    . Partial hysterectomy     Family History  Problem Relation Age of Onset  . Diabetes Mother   . Hypertension Mother   . Coronary artery disease Mother   . Pneumonia Father   . Hypertension Sister   . Hypertension Sister   . Hypertension Brother   . Hypertension Brother     History  Substance Use Topics  . Smoking status: Former Smoker -- 0.3 packs/day for 20 years    Types: Cigarettes    Quit date: 07/06/2012  . Smokeless tobacco: Never Used     Comment: quit 07/23/2012  . Alcohol Use: No    OB History    Grav Para Term Preterm Abortions TAB SAB Ect Mult Living   4 4 4       4       Review of Systems  Constitutional: Positive for chills. Negative for fatigue.  HENT: Negative for ear pain, congestion, sinus pressure and ear discharge.   Eyes: Negative for discharge and redness.  Respiratory: Positive for cough and shortness of breath.   Cardiovascular: Positive for chest pain.  Gastrointestinal: Negative for abdominal pain and diarrhea.  Genitourinary: Negative for frequency and hematuria.  Musculoskeletal: Negative for back pain.  Skin: Negative for rash.  Neurological: Negative for seizures and headaches.  Hematological: Negative.   Psychiatric/Behavioral: Negative for hallucinations.  All other systems reviewed and are negative.    Allergies  Review  of patient's allergies indicates no known allergies.  Home Medications   Current Outpatient Rx  Name  Route  Sig  Dispense  Refill  . ASPIRIN 81 MG PO TABS   Oral   Take 81 mg by mouth daily.           Marland Kitchen BENZONATATE 100 MG PO CAPS   Oral   Take 1 capsule (100 mg total) by mouth every 6 (six) hours as needed for cough.   30 capsule   0   . BUPROPION HCL ER (SR) 150 MG PO TB12   Oral   Take 1 tablet (150 mg total) by mouth 2 (two) times daily.   60 tablet   2   . CALCIUM CARBONATE-VITAMIN D 500-200 MG-UNIT PO  TABS   Oral   Take 1 tablet by mouth 2 (two) times daily.   180 tablet   3   . CLOTRIMAZOLE-BETAMETHASONE 1-0.05 % EX CREA   Topical   Apply topically 2 (two) times daily.   45 g   1   . FLUTICASONE PROPIONATE 50 MCG/ACT NA SUSP   Nasal   Place 2 sprays into the nose daily.         . CENTRUM PO CHEW   Oral   Chew 1 tablet by mouth daily.   90 tablet   3   . PENICILLIN V POTASSIUM 500 MG PO TABS   Oral   Take 1 tablet (500 mg total) by mouth 3 (three) times daily.   30 tablet   0     Triage Vitals: BP 149/65  Pulse 84  Temp 97.9 F (36.6 C) (Oral)  Ht 5\' 7"  (1.702 m)  Wt 215 lb (97.523 kg)  BMI 33.67 kg/m2  SpO2 100%  Physical Exam  Constitutional: She is oriented to person, place, and time. She appears well-developed.  HENT:  Head: Normocephalic and atraumatic.  Eyes: Conjunctivae normal and EOM are normal. No scleral icterus.  Neck: Neck supple. No thyromegaly present.  Cardiovascular: Normal rate and regular rhythm.  Exam reveals no gallop and no friction rub.   No murmur heard. Pulmonary/Chest: No stridor. She has no wheezes. She has no rales. She exhibits no tenderness.       Minor crackles bilaterally.  Abdominal: She exhibits no distension. There is no tenderness. There is no rebound.  Musculoskeletal: Normal range of motion. She exhibits no edema.  Lymphadenopathy:    She has no cervical adenopathy.  Neurological: She is oriented to person, place, and time. Coordination normal.  Skin: No rash noted. No erythema.  Psychiatric: She has a normal mood and affect. Her behavior is normal.    ED Course  Procedures (including critical care time) DIAGNOSTIC STUDIES: Oxygen Saturation is 100% on room air, normal by my interpretation.    COORDINATION OF CARE: 15:25-I evaluated the patient and we discussed a treatment plan including chest x-ray to which the pt agreed.   15:30--I rechecked the pt and notified her of the results of her x-ray. She is ready  for discharge.  Labs Reviewed - No data to display Dg Chest 2 View  08/08/2012  *RADIOLOGY REPORT*  Clinical Data: Cough and congestion, shortness of breath  CHEST - 2 VIEW  Comparison: 03/19/2012  Findings: Upper-normal size of cardiac silhouette. Mediastinal contours and pulmonary vascularity normal. Atherosclerotic calcification aorta. Mild emphysematous and bronchitic changes consistent with COPD. No acute infiltrate, pleural effusion or pneumothorax. Bones demineralized.  IMPRESSION: COPD changes. No acute abnormalities.   Original  Report Authenticated By: Ulyses Southward, M.D.      No diagnosis found.    MDM        The chart was scribed for me under my direct supervision.  I personally performed the history, physical, and medical decision making and all procedures in the evaluation of this patient.Laura Lennert, MD 08/08/12 2300

## 2012-08-17 NOTE — Assessment & Plan Note (Signed)
Acute episode, pt has COPD and chronic bronchitis, decongestants and antibiotics prescribed

## 2012-08-17 NOTE — Progress Notes (Addendum)
  Subjective:    Patient ID: [name], female    DOB: 12-08-1940, 72 y.o.   MRN: [id number]  HPI    Review of Systems See HPI C/o intermittent chills and fever  Denies chest pains, palpitations and leg swelling Denies abdominal pain, nausea, vomiting,diarrhea or constipation.   Denies dysuria, frequency, hesitancy or incontinence. Denies joint pain, swelling and limitation in mobility. Denies headaches, seizures, numbness, or tingling. Denies depression, anxiety or insomnia. Denies skin break down or rash.        Objective:   Physical Exam  Patient alert and oriented and in no cardiopulmonary distress.  HEENT: No facial asymmetry, EOMI, frontal and maxillary  sinus tenderness,  oropharynx pink and moist.  Neck supple, anterior cervical adenitis. TM dull bilaterally  Chest: decreased air entry bilaterally, scattered crackles and wheezes  CVS: S1, S2 no murmurs, no S3.  ABD: Soft non tender. Bowel sounds normal.  Ext: No edema  MS: Adequate ROM spine, shoulders, hips and knees.  Skin: Intact, no ulcerations or rash noted.  Psych: Good eye contact, normal affect. Memory intact not anxious or depressed appearing.  CNS: CN 2-12 intact, power, tone and sensation normal throughout.       Assessment & Plan:

## 2012-08-17 NOTE — Assessment & Plan Note (Signed)
Antibiotics prescribed 

## 2012-08-17 NOTE — Assessment & Plan Note (Signed)
Controlled, no change in medication DASH diet and commitment to daily physical activity for a minimum of 30 minutes discussed and encouraged, as a part of hypertension management. The importance of attaining a healthy weight is also discussed.  

## 2012-08-17 NOTE — Assessment & Plan Note (Signed)
Cutting back, now using electric cigarette, wants to quit. Patient counseled for approximately 5 minutes regarding the health risks of ongoing nicotine use, specifically all types of cancer, heart disease, stroke and respiratory failure. The options available for help with cessation ,the behavioral changes to assist the process, and the option to either gradully reduce usage  Or abruptly stop.is also discussed. Pt is also encouraged to set specific goals in number of cigarettes used daily, as well as to set a quit date. '

## 2012-08-18 ENCOUNTER — Telehealth: Payer: Self-pay | Admitting: Family Medicine

## 2012-08-19 ENCOUNTER — Other Ambulatory Visit: Payer: Self-pay | Admitting: Family Medicine

## 2012-08-19 ENCOUNTER — Other Ambulatory Visit: Payer: Self-pay

## 2012-08-19 MED ORDER — LEVOFLOXACIN 500 MG PO TABS: 500.0000 mg | ORAL_TABLET | Freq: Every day | ORAL | Status: DC

## 2012-08-19 NOTE — Telephone Encounter (Signed)
levaquin entered historically pls fax and let pt know

## 2012-08-19 NOTE — Telephone Encounter (Signed)
Called and left message for patient.  Med sent to pharmacy.

## 2012-09-09 ENCOUNTER — Other Ambulatory Visit: Payer: Self-pay | Admitting: Family Medicine

## 2012-09-15 ENCOUNTER — Other Ambulatory Visit: Payer: Self-pay | Admitting: Family Medicine

## 2012-09-30 ENCOUNTER — Encounter: Payer: Medicare Other | Admitting: Family Medicine

## 2012-10-13 ENCOUNTER — Other Ambulatory Visit: Payer: Self-pay | Admitting: Family Medicine

## 2012-10-14 LAB — LIPID PANEL
HDL: 57 mg/dL (ref >39)
LDL Cholesterol: 146 mg/dL — ABNORMAL HIGH (ref 0–99)
Total CHOL/HDL Ratio: 4 Ratio
Triglycerides: 134 mg/dL (ref <150)

## 2012-10-14 LAB — HEMOGLOBIN A1C: Hgb A1c MFr Bld: 6 % — ABNORMAL HIGH (ref <5.7)

## 2012-10-16 ENCOUNTER — Encounter: Payer: Medicare Other | Admitting: Family Medicine

## 2012-10-16 ENCOUNTER — Ambulatory Visit (INDEPENDENT_AMBULATORY_CARE_PROVIDER_SITE_OTHER): Payer: Medicare Other | Admitting: Family Medicine

## 2012-10-16 ENCOUNTER — Encounter: Payer: Self-pay | Admitting: Family Medicine

## 2012-10-16 VITALS — BP 140/70 | HR 104 | Resp 16 | Ht 66.0 in | Wt 217.0 lb

## 2012-10-16 DIAGNOSIS — R7301 Impaired fasting glucose: Secondary | ICD-10-CM

## 2012-10-16 DIAGNOSIS — F17200 Nicotine dependence, unspecified, uncomplicated: Secondary | ICD-10-CM

## 2012-10-16 DIAGNOSIS — E785 Hyperlipidemia, unspecified: Secondary | ICD-10-CM

## 2012-10-16 DIAGNOSIS — E669 Obesity, unspecified: Secondary | ICD-10-CM

## 2012-10-16 DIAGNOSIS — Z0000 Encounter for general adult medical examination without abnormal findings: Secondary | ICD-10-CM

## 2012-10-16 MED ORDER — PRAVASTATIN SODIUM 40 MG PO TABS: 40.0000 mg | ORAL_TABLET | Freq: Every evening | ORAL | Status: DC

## 2012-10-16 MED ORDER — BUPROPION HCL ER (SR) 150 MG PO TB12: 150.0000 mg | ORAL_TABLET | Freq: Two times a day (BID) | ORAL | Status: DC

## 2012-10-16 NOTE — Progress Notes (Signed)
Subjective:    Patient ID: [name], female    DOB: 10/17/1940, 72 y.o.   MRN: [id number]  HPI Preventive Screening-Counseling & Management   Patient present here today for a Medicare annual wellness visit.   Current Problems (verified)   Medications Prior to Visit Allergies (verified)   PAST HISTORY  Family History  Social History Widow since 1982, mother of 4 children. Cigarettes 7 per day   Risk Factors  Current exercise habits:  Nothing regularly now, has signed uo for the [facility]  Dietary issues discussed:fresh fruit and vegetables and water   Cardiac risk factors: metabolic syndrome and nicotine use  Depression Screen  (Note: if answer to either of the following is "Yes", a more complete depression screening is indicated)   Over the past two weeks, have you felt down, depressed or hopeless? No  Over the past two weeks, have you felt little interest or pleasure in doing things? No  Have you lost interest or pleasure in daily life? No  Do you often feel hopeless? No  Do you cry easily over simple problems? No   Activities of Daily Living  In your present state of health, do you have any difficulty performing the following activities?  Driving?: No Managing money?: No Feeding yourself?:No Getting from bed to chair?:No Climbing a flight of stairs?:No Preparing food and eating?:No Bathing or showering?:No Getting dressed?:No Getting to the toilet?:No Using the toilet?:No Moving around from place to place?: No  Fall Risk Assessment In the past year have you fallen or had a near fall?:No Are you currently taking any medications that make you dizziness?:No   Hearing Difficulties: No Do you often ask people to speak up or repeat themselves?:No Do you experience ringing or noises in your ears?:No Do you have difficulty understanding soft or whispered voices?:No  Cognitive Testing  Alert? Yes Normal Appearance?Yes  Oriented to person? Yes Place? Yes   Time? Yes  Displays appropriate judgment?Yes  Can read the correct time from a watch face? yes Are you having problems remembering things?No  Advanced Directives have been discussed with the patient?Yes , full code   List the Names of Other Physician/Practitioners you currently use: none   Indicate any recent Medical Services you may have received from other than [facility] providers in the past year (date may be approximate).   Assessment:    Annual Wellness Exam   Plan:    During the course of the visit the patient was educated and counseled about appropriate screening and preventive services including:  A healthy diet is rich in fruit, vegetables and whole grains. Poultry fish, nuts and beans are a healthy choice for protein rather then red meat. A low sodium diet and drinking 64 ounces of water daily is generally recommended. Oils and sweet should be limited. Carbohydrates especially for those who are diabetic or overweight, should be limited to 30-45 gram per meal. It is important to eat on a regular schedule, at least 3 times daily. Snacks should be primarily fruits, vegetables or nuts. It is important that you exercise regularly at least 30 minutes 5 times a week. If you develop chest pain, have severe difficulty breathing, or feel very tired, stop exercising immediately and seek medical attention  Immunization reviewed and updated. Cancer screening reviewed and updated    Patient Instructions (the written plan) was given to the patient.  Medicare Attestation  I have personally reviewed:  The patient's medical and social history  Their use of alcohol, tobacco  or illicit drugs  Their current medications and supplements  The patient's functional ability including ADLs,fall risks, home safety risks, cognitive, and hearing and visual impairment  Diet and physical activities  Evidence for depression or mood disorders  The patient's weight, height, BMI, and visual acuity have been  recorded in the chart. I have made referrals, counseling, and provided education to the patient based on review of the above and I have provided the patient with a written personalized care plan for preventive services.      Review of Systems     Objective:   Physical Exam        Assessment & Plan:

## 2012-10-16 NOTE — Patient Instructions (Addendum)
F/u in 4.5 month, call if you need me before  Cholesterol is increased, start pravachol 40mg  at bedtime, reduce fried and fatty foods and red meat. No need to take the red rice yeast with the pravachol. Call if too expensive  Start regular exercise, join the [facility],   Please work on reducing food intake so you lose weight, goal of 8 pounds in the next 4 month  Fasting lipid, cmp and EGFr and HBA1C in 4.5 month

## 2012-10-19 DIAGNOSIS — Z0000 Encounter for general adult medical examination without abnormal findings: Secondary | ICD-10-CM | POA: Insufficient documentation

## 2012-10-19 NOTE — Assessment & Plan Note (Signed)
Annual wellness as documented Pt needs to work on weight loss through more disciplined eating habits also commitment to regular exercise. She has started the process of joining the [employer] End of life have been addressed, full code status at this time, I also encourage her to make her children aware of her wishes

## 2012-12-08 ENCOUNTER — Telehealth: Payer: Self-pay

## 2012-12-08 NOTE — Telephone Encounter (Signed)
Only otc medication, needs to schedule appt before antibiotic is prescribed

## 2012-12-08 NOTE — Telephone Encounter (Signed)
Patient aware.

## 2013-01-27 ENCOUNTER — Other Ambulatory Visit: Payer: Self-pay | Admitting: Family Medicine

## 2013-01-27 DIAGNOSIS — Z139 Encounter for screening, unspecified: Secondary | ICD-10-CM

## 2013-01-30 ENCOUNTER — Ambulatory Visit (HOSPITAL_COMMUNITY)
Admission: RE | Admit: 2013-01-30 | Discharge: 2013-01-30 | Disposition: A | Discharge status: Home / Self Care | Payer: Medicare Other | Source: Ambulatory Visit | Attending: Family Medicine | Admitting: Family Medicine

## 2013-01-30 DIAGNOSIS — Z1231 Encounter for screening mammogram for malignant neoplasm of breast: Secondary | ICD-10-CM | POA: Insufficient documentation

## 2013-01-30 DIAGNOSIS — Z139 Encounter for screening, unspecified: Secondary | ICD-10-CM

## 2013-02-10 ENCOUNTER — Encounter: Payer: Self-pay | Admitting: General Surgery

## 2013-02-12 ENCOUNTER — Telehealth: Payer: Self-pay

## 2013-02-12 ENCOUNTER — Encounter: Payer: Self-pay | Admitting: Gastroenterology

## 2013-02-12 ENCOUNTER — Other Ambulatory Visit: Payer: Self-pay | Admitting: Family Medicine

## 2013-02-12 ENCOUNTER — Ambulatory Visit (INDEPENDENT_AMBULATORY_CARE_PROVIDER_SITE_OTHER): Payer: Medicare Other | Admitting: Gastroenterology

## 2013-02-12 VITALS — BP 150/75 | HR 93 | Temp 97.1°F | Ht 66.0 in | Wt 214.4 lb

## 2013-02-12 DIAGNOSIS — R7303 Prediabetes: Secondary | ICD-10-CM

## 2013-02-12 DIAGNOSIS — R1319 Other dysphagia: Secondary | ICD-10-CM

## 2013-02-12 DIAGNOSIS — R1013 Epigastric pain: Secondary | ICD-10-CM

## 2013-02-12 DIAGNOSIS — E669 Obesity, unspecified: Secondary | ICD-10-CM

## 2013-02-12 DIAGNOSIS — K219 Gastro-esophageal reflux disease without esophagitis: Secondary | ICD-10-CM | POA: Insufficient documentation

## 2013-02-12 DIAGNOSIS — E663 Overweight: Secondary | ICD-10-CM | POA: Insufficient documentation

## 2013-02-12 DIAGNOSIS — R918 Other nonspecific abnormal finding of lung field: Secondary | ICD-10-CM

## 2013-02-12 DIAGNOSIS — K5900 Constipation, unspecified: Secondary | ICD-10-CM | POA: Insufficient documentation

## 2013-02-12 DIAGNOSIS — Z87891 Personal history of nicotine dependence: Secondary | ICD-10-CM

## 2013-02-12 NOTE — Patient Instructions (Addendum)
1. Please discuss need for followup chest CT (regarding lung nodule) with Dr. Anthony Sar office. You were supposed to have this done around April 2013. 2. We have requested copy of your last colonoscopy report. We will let you know if you're due for colonoscopy at this time. If you are with, we will consider an upper endoscopy to further evaluate your symptoms. Otherwise we may offer her U. a barium study instead.

## 2013-02-12 NOTE — Progress Notes (Signed)
Primary Care Physician:  Syliva Overman, MD  Primary Gastroenterologist:  Jonette Eva, MD   Chief Complaint  Patient presents with  . Abdominal Pain    HPI:  [name] is a 72 y.o. female here today as self-referral to have her hernia reevaluated. States she was told by Dr. Lovell Sheehan in 2012 that she had a hiatal hernia. She c/o upper abdominal discomfort with meals and activity. Pain with pulling the trash can or trying to sit up from lying position. Notes bulge in abdomen with these activities. Takes charcoal gas medication for "trapped gas". Lot of belching. Feels like food stops in lower esophagus/epigastrium. No heartburn. Takes MOM for constipation. Takes 1-2 times per weekly. When bowels moves, feels like never complete. Fells like stool only moving from the "lower belly". Stools loose. No melena, brbpr. Feels gas/bloating discomfort. No vomiting. Normal appetite. No weight loss. Drinks lot of water, black coffee, unsweetened tea. Takes ASA 81mg  daily. No NSAIDs. Has lot of sinus problems.  Current Outpatient Prescriptions  Medication Sig Dispense Refill  . ADVAIR DISKUS 100-50 MCG/DOSE AEPB INHALE 1 PUFF INTO THE LUNGS  2 (TWO) TIMES DAILY.  60 each  2  . aspirin 81 MG tablet Take 81 mg by mouth daily.        Marland Kitchen buPROPion (WELLBUTRIN SR) 150 MG 12 hr tablet Take 1 tablet (150 mg total) by mouth 2 (two) times daily.  60 tablet  3  . calcium-vitamin D (OSCAL WITH D) 500-200 MG-UNIT per tablet Take 1 tablet by mouth 2 (two) times daily.  180 tablet  3  . clotrimazole-betamethasone (LOTRISONE) cream Apply topically 2 (two) times daily.  45 g  1  . GUAIFENESIN PO Take 400 mg by mouth as needed.      . hydrochlorothiazide (HYDRODIURIL) 25 MG tablet TAKE 1 TABLET (25 MG TOTAL) BY MOUTH DAILY.  30 tablet  3  . omeprazole (PRILOSEC OTC) 20 MG tablet Take 20 mg by mouth daily.      . pravastatin (PRAVACHOL) 40 MG tablet Take 1 tablet (40 mg total) by mouth every evening.  30 tablet  11    No current facility-administered medications for this visit.    Allergies as of 02/12/2013  . (No Known Allergies)    Past Medical History  Diagnosis Date  . Sinusitis   . OA (osteoarthritis) of knee   . Nicotine addiction   . COPD (chronic obstructive pulmonary disease)   . Obesity   . Hypertension   . Diabetes mellitus, type 2   . Lung nodule seen on imaging study     Past Surgical History  Procedure Laterality Date  . Knee arthroscopy right    . Cholecystectomy    . Total knee arthroplasty right  05/29/2005    Dr. Romeo Apple   . Bilateral cataract surgery  7/27 & 03/01/09    Dr. Nile Riggs    . Partial hysterectomy    . Esophagogastroduodenoscopy  11/07/2010    Jenkins:hiatal hernia/no evidence of Barrett esophagitis.  The Z-line was noted to be at 39 cm from the teeth. CLO test negative    Family History  Problem Relation Age of Onset  . Diabetes Mother   . Hypertension Mother   . Coronary artery disease Mother   . Pneumonia Father   . Hypertension Sister   . Hypertension Sister   . Hypertension Brother   . Hypertension Brother   . Colon cancer Neg Hx   . Liver disease Neg Hx  History   Social History  . Marital Status: Widowed    Spouse Name: N/A    Number of Children: 4  . Years of Education: N/A   Occupational History  . employed     Social History Main Topics  . Smoking status: Current Every Day Smoker -- 0.25 packs/day for 20 years    Types: Cigarettes  . Smokeless tobacco: Never Used     Comment: quit 07/23/2012  . Alcohol Use: No  . Drug Use: No  . Sexually Active: Not on file   Other Topics Concern  . Not on file   Social History Narrative  . No narrative on file      ROS:  General: Negative for anorexia, weight loss, fever, chills, fatigue, weakness. Eyes: Negative for vision changes.  ENT: Negative for hoarseness, difficulty swallowing , nasal congestion. CV: Negative for chest pain, angina, palpitations, dyspnea on exertion,  peripheral edema.  Respiratory: Negative for dyspnea at rest, dyspnea on exertion, cough, sputum, wheezing.  GI: See history of present illness. GU:  Negative for dysuria, hematuria, urinary incontinence, urinary frequency, nocturnal urination.  MS: Negative for joint pain, low back pain.  Derm: Negative for rash or itching.  Neuro: Negative for weakness, abnormal sensation, seizure, frequent headaches, memory loss, confusion.  Psych: Negative for anxiety, depression, suicidal ideation, hallucinations.  Endo: Negative for unusual weight change.  Heme: Negative for bruising or bleeding. Allergy: Negative for rash or hives.    Physical Examination:  BP 150/75  Pulse 93  Temp(Src) 97.1 F (36.2 C) (Oral)  Ht 5\' 6"  (1.676 m)  Wt 214 lb 6.4 oz (97.251 kg)  BMI 34.62 kg/m2   General: Well-nourished, well-developed in no acute distress.  Head: Normocephalic, atraumatic.   Eyes: Conjunctiva pink, no icterus. Mouth: Oropharyngeal mucosa moist and pink , no lesions erythema or exudate. Neck: Supple without thyromegaly, masses, or lymphadenopathy.  Lungs: Clear to auscultation bilaterally.  Heart: Regular rate and rhythm, no murmurs rubs or gallops.  Abdomen: Bowel sounds are normal, mild epigastric discomfort. Positive for diastases recti.  nondistended, no hepatosplenomegaly or masses, no abdominal bruits or    hernia , no rebound or guarding.   Rectal: Not performed Extremities: 1+ pitting lower extremity edema. No clubbing or deformities.  Neuro: Alert and oriented x 4 , grossly normal neurologically.  Skin: Warm and dry, no rash or jaundice.   Psych: Alert and cooperative, normal mood and affect.

## 2013-02-12 NOTE — Progress Notes (Signed)
Cc PCP 

## 2013-02-12 NOTE — Telephone Encounter (Signed)
Referral ahs been entered, she may need chem 7, pls order fasting lipid and cmp if she does, will send another note to let you know if other labs are needed

## 2013-02-12 NOTE — Progress Notes (Signed)
Reviewed records from Dr. Elpidio Anis 04/2004: TCS--> diffuse melanosis coli.  04/2004: EGD-->antral gastritis  Please schedule TCS/EGD+/-ED with [facility].

## 2013-02-12 NOTE — Telephone Encounter (Signed)
Needs cbc, and HBA1C also,needs  an appt scheduled to discuss results and for routinre f/u

## 2013-02-12 NOTE — Telephone Encounter (Signed)
pls change her appt from August to July, thanks, she does also nEED to have both the scan and blood work before the visit

## 2013-02-12 NOTE — Assessment & Plan Note (Signed)
72 year old lady presents with several month history of upper abdominal discomfort noted with meals as well as with lifting, etc. She wonders if her symptoms are secondary to her hiatal hernia. She feels like she has a blockage in epigastrium when she does have a bowel movement. No associated weight loss, vomiting. Feels like the food sticks in the lower esophagus/epigastric area. EGD by Dr. Lovell Sheehan in April 2012 demonstrate a small sliding hiatal hernia. CLOtest was negative. CT of the abdomen and pelvis in December 2012 without significant findings. She had some fatty liver. LFTs have been normal. Her last colonoscopy was several years ago by Dr. Elpidio Anis. She has chronic constipation managed with milk of magnesia. Complains of frequent belching but heartburn controlled on Prilosec.   Retrieve copy of last colonoscopy report. If she is due for colonoscopy at this time, consider upper endoscopy to further evaluate her upper GI symptoms. Otherwise would offer her a barium pill esophagram/upper GI series given she had an endoscopy done a couple of years ago. Further recommendations to follow.  I note that the patient had a pulmonary nodule on prior chest CT with recommendations for a 4 month followup chest CT which would've been due around April 2013. Patient states she has been waiting on Dr. Anthony Sar office to arrange this. I've asked her to follow up with them to have this done.

## 2013-02-16 NOTE — Progress Notes (Signed)
Called and left VM for pt that [name] has said to schedule procedures and [name] will be giving her a call to do so.

## 2013-02-16 NOTE — Telephone Encounter (Signed)
Called and left msg for patient notifying of need for appt and blood work.  She has CT scan scheduled for 7/29  Lab requisition faxed to [facility]

## 2013-02-16 NOTE — Addendum Note (Signed)
Addended by: Kandis Fantasia B on: 02/16/2013 04:02 PM   Modules accepted: Orders

## 2013-02-17 ENCOUNTER — Ambulatory Visit (HOSPITAL_COMMUNITY)
Admission: RE | Admit: 2013-02-17 | Discharge: 2013-02-17 | Disposition: A | Discharge status: Home / Self Care | Payer: Medicare Other | Source: Ambulatory Visit | Attending: Family Medicine | Admitting: Family Medicine

## 2013-02-17 ENCOUNTER — Other Ambulatory Visit: Payer: Self-pay | Admitting: Gastroenterology

## 2013-02-17 DIAGNOSIS — R918 Other nonspecific abnormal finding of lung field: Secondary | ICD-10-CM

## 2013-02-17 DIAGNOSIS — R109 Unspecified abdominal pain: Secondary | ICD-10-CM

## 2013-02-17 DIAGNOSIS — K2950 Unspecified chronic gastritis without bleeding: Secondary | ICD-10-CM

## 2013-02-17 DIAGNOSIS — K6389 Other specified diseases of intestine: Secondary | ICD-10-CM

## 2013-02-17 DIAGNOSIS — R911 Solitary pulmonary nodule: Secondary | ICD-10-CM | POA: Insufficient documentation

## 2013-02-17 DIAGNOSIS — Z09 Encounter for follow-up examination after completed treatment for conditions other than malignant neoplasm: Secondary | ICD-10-CM | POA: Insufficient documentation

## 2013-02-17 DIAGNOSIS — Z87891 Personal history of nicotine dependence: Secondary | ICD-10-CM

## 2013-02-17 MED ORDER — PEG 3350-KCL-NA BICARB-NACL 420 G PO SOLR: 4000.0000 mL | ORAL | Status: DC

## 2013-02-17 NOTE — Progress Notes (Signed)
Patient is scheduled with Dr. Darrick Penna on 8/6 and I have mailed her the instructions and she is aware

## 2013-02-18 ENCOUNTER — Encounter (HOSPITAL_COMMUNITY): Payer: Self-pay | Admitting: Pharmacy Technician

## 2013-02-25 ENCOUNTER — Encounter (HOSPITAL_COMMUNITY)
Admission: RE | Disposition: A | Discharge status: Home / Self Care | Payer: Self-pay | Source: Ambulatory Visit | Attending: Gastroenterology

## 2013-02-25 ENCOUNTER — Encounter (HOSPITAL_COMMUNITY): Payer: Self-pay | Admitting: *Deleted

## 2013-02-25 ENCOUNTER — Ambulatory Visit (HOSPITAL_COMMUNITY)
Admission: RE | Admit: 2013-02-25 | Discharge: 2013-02-25 | Disposition: A | Discharge status: Home / Self Care | Payer: Medicare Other | Source: Ambulatory Visit | Attending: Gastroenterology | Admitting: Gastroenterology

## 2013-02-25 DIAGNOSIS — J4489 Other specified chronic obstructive pulmonary disease: Secondary | ICD-10-CM | POA: Insufficient documentation

## 2013-02-25 DIAGNOSIS — K6389 Other specified diseases of intestine: Secondary | ICD-10-CM | POA: Insufficient documentation

## 2013-02-25 DIAGNOSIS — K5730 Diverticulosis of large intestine without perforation or abscess without bleeding: Secondary | ICD-10-CM | POA: Insufficient documentation

## 2013-02-25 DIAGNOSIS — R1310 Dysphagia, unspecified: Secondary | ICD-10-CM | POA: Insufficient documentation

## 2013-02-25 DIAGNOSIS — K2900 Acute gastritis without bleeding: Secondary | ICD-10-CM

## 2013-02-25 DIAGNOSIS — K3189 Other diseases of stomach and duodenum: Secondary | ICD-10-CM

## 2013-02-25 DIAGNOSIS — Z1211 Encounter for screening for malignant neoplasm of colon: Secondary | ICD-10-CM

## 2013-02-25 DIAGNOSIS — K2940 Chronic atrophic gastritis without bleeding: Secondary | ICD-10-CM | POA: Insufficient documentation

## 2013-02-25 DIAGNOSIS — R109 Unspecified abdominal pain: Secondary | ICD-10-CM

## 2013-02-25 DIAGNOSIS — D126 Benign neoplasm of colon, unspecified: Secondary | ICD-10-CM | POA: Insufficient documentation

## 2013-02-25 DIAGNOSIS — Z79899 Other long term (current) drug therapy: Secondary | ICD-10-CM | POA: Insufficient documentation

## 2013-02-25 DIAGNOSIS — I10 Essential (primary) hypertension: Secondary | ICD-10-CM | POA: Insufficient documentation

## 2013-02-25 DIAGNOSIS — J449 Chronic obstructive pulmonary disease, unspecified: Secondary | ICD-10-CM | POA: Insufficient documentation

## 2013-02-25 DIAGNOSIS — R1013 Epigastric pain: Secondary | ICD-10-CM

## 2013-02-25 DIAGNOSIS — K2950 Unspecified chronic gastritis without bleeding: Secondary | ICD-10-CM

## 2013-02-25 HISTORY — PX: SAVORY DILATION: SHX5439

## 2013-02-25 HISTORY — PX: MALONEY DILATION: SHX5535

## 2013-02-25 HISTORY — PX: COLONOSCOPY WITH ESOPHAGOGASTRODUODENOSCOPY (EGD): SHX5779

## 2013-02-25 SURGERY — COLONOSCOPY WITH ESOPHAGOGASTRODUODENOSCOPY (EGD)
Anesthesia: Moderate Sedation

## 2013-02-25 MED ORDER — MEPERIDINE HCL 100 MG/ML IJ SOLN
INTRAMUSCULAR | Status: AC
  Filled 2013-02-25: qty 1

## 2013-02-25 MED ORDER — MEPERIDINE HCL 100 MG/ML IJ SOLN
INTRAMUSCULAR | Status: DC | PRN
  Administered 2013-02-25: 50 mg via INTRAVENOUS
  Administered 2013-02-25: 25 mg via INTRAVENOUS

## 2013-02-25 MED ORDER — MIDAZOLAM HCL 5 MG/5ML IJ SOLN
INTRAMUSCULAR | Status: AC
  Filled 2013-02-25: qty 10

## 2013-02-25 MED ORDER — MIDAZOLAM HCL 5 MG/5ML IJ SOLN
INTRAMUSCULAR | Status: DC | PRN
  Administered 2013-02-25 (×2): 1 mg via INTRAVENOUS
  Administered 2013-02-25 (×2): 2 mg via INTRAVENOUS

## 2013-02-25 MED ORDER — MINERAL OIL PO OIL
TOPICAL_OIL | ORAL | Status: AC
  Filled 2013-02-25: qty 30

## 2013-02-25 MED ORDER — SODIUM CHLORIDE 0.9 % IV SOLN
INTRAVENOUS | Status: DC
  Administered 2013-02-25: 10:00:00 via INTRAVENOUS

## 2013-02-25 MED ORDER — BUTAMBEN-TETRACAINE-BENZOCAINE 2-2-14 % EX AERO
INHALATION_SPRAY | CUTANEOUS | Status: DC | PRN
  Administered 2013-02-25: 2 via TOPICAL

## 2013-02-25 NOTE — H&P (Signed)
Primary Care Physician:  Syliva Overman, MD Primary Gastroenterologist:  Dr. Darrick Penna  Pre-Procedure History & Physical: HPI:  [name] is a 72 y.o. female here for DYSPHAGIA/DYSPEPSIA/screening.   Past Medical History  Diagnosis Date  . Sinusitis   . OA (osteoarthritis) of knee   . Nicotine addiction   . COPD (chronic obstructive pulmonary disease)   . Obesity   . Hypertension   . Lung nodule seen on imaging study     Past Surgical History  Procedure Laterality Date  . Knee arthroscopy right    . Cholecystectomy    . Total knee arthroplasty right  05/29/2005    Dr. Romeo Apple   . Bilateral cataract surgery  7/27 & 03/01/09    Dr. Nile Riggs    . Partial hysterectomy    . Esophagogastroduodenoscopy  11/07/2010    Jenkins:hiatal hernia/no evidence of Barrett esophagitis.  The Z-line was noted to be at 39 cm from the teeth. CLO test negative  . Colonoscopy  04/2004    Dr. Jerolyn Shin Smith-->melanosis coli    Prior to Admission medications   Medication Sig Start Date End Date Taking? Authorizing Provider  aspirin EC 81 MG tablet Take 81 mg by mouth daily.   Yes Historical Provider, MD  calcium-vitamin D (OSCAL WITH D) 500-200 MG-UNIT per tablet Take 1 tablet by mouth 2 (two) times daily. 05/20/12  Yes Kerri Perches, MD  hydrochlorothiazide (HYDRODIURIL) 25 MG tablet Take 25 mg by mouth daily.   Yes Historical Provider, MD  omeprazole (PRILOSEC OTC) 20 MG tablet Take 20 mg by mouth daily.   Yes Historical Provider, MD  polyethylene glycol-electrolytes (TRILYTE) 420 G solution Take 4,000 mLs by mouth as directed. 02/17/13  Yes West Bali, MD  pravastatin (PRAVACHOL) 40 MG tablet Take 1 tablet (40 mg total) by mouth every evening. 10/16/12 10/16/13 Yes Kerri Perches, MD    Allergies as of 02/17/2013  . (No Known Allergies)    Family History  Problem Relation Age of Onset  . Diabetes Mother   . Hypertension Mother   . Coronary artery disease Mother   . Pneumonia  Father   . Hypertension Sister   . Hypertension Sister   . Hypertension Brother   . Hypertension Brother   . Colon cancer Neg Hx   . Liver disease Neg Hx     History   Social History  . Marital Status: Widowed    Spouse Name: N/A    Number of Children: 4  . Years of Education: N/A   Occupational History  . employed     Social History Main Topics  . Smoking status: Current Every Day Smoker -- 10 years    Types: Cigarettes  . Smokeless tobacco: Never Used     Comment: pt currently uses E-Cigarettes  . Alcohol Use: No  . Drug Use: No  . Sexually Active: Not on file   Other Topics Concern  . Not on file   Social History Narrative  . No narrative on file    Review of Systems: See HPI, otherwise negative ROS   Physical Exam: BP 153/83  Temp(Src) 98.1 F (36.7 C) (Oral)  Resp 20  SpO2 96% General:   Alert,  pleasant and cooperative in NAD Head:  Normocephalic and atraumatic. Neck:  Supple; Lungs:  Clear throughout to auscultation.    Heart:  Regular rate and rhythm. Abdomen:  Soft, nontender and nondistended. Normal bowel sounds, without guarding, and without rebound.   Neurologic:  Alert  and  oriented x4;  grossly normal neurologically.  Impression/Plan:    DYSPHAGIA/screening  PLAN:  EGD/DIL/tcs TODAY

## 2013-02-25 NOTE — Op Note (Signed)
[facility] [address] [place], [address]   ENDOSCOPY PROCEDURE REPORT  PATIENT: [name], [name]  MR#: [id number] BIRTHDATE: 1940-10-04 , 72  yrs. old GENDER: Female  ENDOSCOPIST: Jonette Eva, MD REFFERED XB:JYNWGNFA Lodema Hong, M.D.  PROCEDURE DATE:  02/25/2013 PROCEDURE:   EGD with biopsy and EGD with dilatation over guidewire   INDICATIONS:1.  dysphagia.   2.  dyspepsia. MEDICATIONS: TCS+ Demerol 25 mg IV and Versed 1mg  IV TOPICAL ANESTHETIC: Cetacaine Spray  DESCRIPTION OF PROCEDURE:   After the risks benefits and alternatives of the procedure were thoroughly explained, informed consent was obtained.  The EG-2990i ([id number]) and EC-2990Li ([id number])  endoscope was introduced through the mouth and advanced to the second portion of the duodenum. The instrument was slowly withdrawn as the mucosa was carefully examined.  Prior to withdrawal of the scope, the guidwire was placed.  The esophagus was dilated successfully.  The patient was recovered in endoscopy and discharged home in satisfactory condition.   ESOPHAGUS: The mucosa of the esophagus appeared normal.  EMPIRIC DILATION PERFORMED DUE TO C/O DYSPHAGIA. STOMACH: Mild non-erosive gastritis (inflammation) was found in the gastric antrum.  Multiple biopsies were performed using cold forceps.   DUODENUM: The duodenal mucosa showed no abnormalities in the bulb and second portion of the duodenum.   Dilation was then performed at the gastroesphageal junction Dilator: Savary over guidewire Size(s): 15-16 MM Resistance: moderate Heme: none  COMPLICATIONS: There were no complications.   ENDOSCOPIC IMPRESSION: 1.   The mucosa of the esophagus appeared normal 2.   Non-erosive gastritis  RECOMMENDATIONS: AWAIT BIOPSY CONTINUE OMEPRAZOLE LOW FAT/HIGH IFBER DIET OPV IN 4 MOS.  IF CONTINUES WITH DYSPHAGIA, NEEDS BPE.      _______________________________ Rosalie DoctorJonette Eva, MD 02/25/2013 6:00  PM

## 2013-02-25 NOTE — Op Note (Signed)
[facility] [address] [place], [address]   COLONOSCOPY PROCEDURE REPORT  PATIENT: Laura, Hurley  MR#: [id number] BIRTHDATE: May 19, 1941 , 72  yrs. old GENDER: Female ENDOSCOPIST: Jonette Eva, MD REFERRED XB:JYNWGNFA Lodema Hong, M.D. PROCEDURE DATE:  02/25/2013 PROCEDURE:   Colonoscopy with cold biopsy polypectomy INDICATIONS:Average risk patient for colon cancer. MEDICATIONS: Demerol 50 mg IV and Versed 5 mg IV  DESCRIPTION OF PROCEDURE:    Physical exam was performed.  Informed consent was obtained from the patient after explaining the benefits, risks, and alternatives to procedure.  The patient was connected to monitor and placed in left lateral position. Continuous oxygen was provided by nasal cannula and IV medicine administered through an indwelling cannula.  After administration of sedation and rectal exam, the patients rectum was intubated and the EC-3890Li ([id number]) and EG-[id number] ([id number])  colonoscope was advanced under direct visualization to the ileum.  The scope was removed slowly by carefully examining the color, texture, anatomy, and integrity mucosa on the way out.  The patient was recovered in endoscopy and discharged home in satisfactory condition.       COLON FINDINGS: The mucosa appeared normal in the terminal ileum.  , Severe melanosis was found throughout the entire examined colon.  , A sessile polyp measuring 4 mm in size was found in the sigmoid colon.  A polypectomy was performed with cold forceps.  , Mild diverticulosis was noted in the sigmoid colon.  , and The colon mucosa was otherwise normal.   NO hemorrhoids  PREP QUALITY: good.   CECAL W/D TIME: 13 minutes     COMPLICATIONS: None  ENDOSCOPIC IMPRESSION: 1.   Normal mucosa in the terminal ileum 2.   Severe melanosis throughout the entire examined colon 3.   ONE COLON POLYP REMOVED 4.   Mild diverticulosis  in the sigmoid colon  RECOMMENDATIONS: AWAIT BIOPSY HIGH  FIBER DIET TCS IN 10 YEARS       _______________________________ eSignedJonette Eva, MD 02/25/2013 5:55 PM

## 2013-02-27 ENCOUNTER — Encounter (HOSPITAL_COMMUNITY): Payer: Self-pay | Admitting: Gastroenterology

## 2013-03-05 ENCOUNTER — Telehealth: Payer: Self-pay | Admitting: Gastroenterology

## 2013-03-05 NOTE — Telephone Encounter (Signed)
Reminder in epic °

## 2013-03-05 NOTE — Telephone Encounter (Signed)
Pt aware results and would like a copy mailed to her.

## 2013-03-05 NOTE — Telephone Encounter (Addendum)
Please call pt. She had a simple adenoma removed from her colon. HER stomach Bx shows gastritis. HER BELCHING AND UPPER ABDOMINAL PAIN IS DUE TO REFLUX AND GASTRITIS AND THE PRESENCE OF HER HIATAL HERNIA. SHE NEEDS TO EAT RIGHT,LOSE WEIGHT, AND TAKE HER MEDS.   CONTINUE OMEPRAZOLE.  TAKE 30 MINUTES PRIOR TO YOUR FIRST MEAL. FOLLOW A HIGH FIBER/LOW FAT DIET.  CONTINUE YOUR WEIGHT LOSS EFFORTS. LOSE 10 LBS. FOLLOW UP IN DEC 2014 E30 DYSPHAGIA  TCS IN 10 YEARS IF THE BENEFITS OUTWEIGH TH E RISKS.

## 2013-03-11 LAB — CBC
HCT: 36.8 % (ref 36.0–46.0)
Hemoglobin: 12.6 g/dL (ref 12.0–15.0)
MCV: 86.2 fL (ref 78.0–100.0)
RDW: 15.1 % (ref 11.5–15.5)
WBC: 7.9 10*3/uL (ref 4.0–10.5)

## 2013-03-18 ENCOUNTER — Ambulatory Visit (INDEPENDENT_AMBULATORY_CARE_PROVIDER_SITE_OTHER): Payer: Medicare Other | Admitting: Family Medicine

## 2013-03-18 ENCOUNTER — Encounter: Payer: Self-pay | Admitting: Family Medicine

## 2013-03-18 VITALS — BP 122/68 | HR 100 | Resp 18 | Ht 66.0 in | Wt 217.0 lb

## 2013-03-18 DIAGNOSIS — J449 Chronic obstructive pulmonary disease, unspecified: Secondary | ICD-10-CM

## 2013-03-18 DIAGNOSIS — J42 Unspecified chronic bronchitis: Secondary | ICD-10-CM

## 2013-03-18 DIAGNOSIS — J309 Allergic rhinitis, unspecified: Secondary | ICD-10-CM

## 2013-03-18 DIAGNOSIS — F17200 Nicotine dependence, unspecified, uncomplicated: Secondary | ICD-10-CM

## 2013-03-18 DIAGNOSIS — R7303 Prediabetes: Secondary | ICD-10-CM

## 2013-03-18 DIAGNOSIS — E785 Hyperlipidemia, unspecified: Secondary | ICD-10-CM

## 2013-03-18 DIAGNOSIS — K219 Gastro-esophageal reflux disease without esophagitis: Secondary | ICD-10-CM

## 2013-03-18 DIAGNOSIS — R7309 Other abnormal glucose: Secondary | ICD-10-CM

## 2013-03-18 DIAGNOSIS — J209 Acute bronchitis, unspecified: Secondary | ICD-10-CM | POA: Insufficient documentation

## 2013-03-18 DIAGNOSIS — I10 Essential (primary) hypertension: Secondary | ICD-10-CM

## 2013-03-18 MED ORDER — FLUTICASONE PROPIONATE 50 MCG/ACT NA SUSP: 2.0000 | Freq: Every day | NASAL | Status: DC

## 2013-03-18 MED ORDER — PENICILLIN V POTASSIUM 500 MG PO TABS: 500.0000 mg | ORAL_TABLET | Freq: Three times a day (TID) | ORAL | Status: DC

## 2013-03-18 MED ORDER — CETIRIZINE HCL 5 MG/5ML PO SYRP: ORAL_SOLUTION | ORAL | Status: DC

## 2013-03-18 MED ORDER — HYDROCHLOROTHIAZIDE 25 MG PO TABS: 25.0000 mg | ORAL_TABLET | Freq: Every day | ORAL | Status: DC

## 2013-03-18 NOTE — Assessment & Plan Note (Signed)
Uncontrolled at last check Hyperlipidemia:Low fat diet discussed and encouraged.  Updated lab next visit

## 2013-03-18 NOTE — Assessment & Plan Note (Signed)
Still using e ciggs plans to quit

## 2013-03-18 NOTE — Assessment & Plan Note (Signed)
Uncontrolled , start daily medication nasal spray and oral med

## 2013-03-18 NOTE — Assessment & Plan Note (Signed)
Continue omeprazole as reports symptoms

## 2013-03-18 NOTE — Assessment & Plan Note (Signed)
Controlled, no change in medication DASH diet and commitment to daily physical activity for a minimum of 30 minutes discussed and encouraged, as a part of hypertension management. The importance of attaining a healthy weight is also discussed.  

## 2013-03-18 NOTE — Patient Instructions (Addendum)
F/u in January, call if you need me before  Call in October for flu vaccine  You are treated for acute bronchitis, take penicillin for 10 days   CONGRATS your blood sugar is now normal  Plan to stop e cigarettes by the end of the year  Fasting lipid, and cmp in January  It is important that you exercise regularly at least 30 minutes 5 times a week. If you develop chest pain, have severe difficulty breathing, or feel very tired, stop exercising immediately and seek medical attention   A healthy diet is rich in fruit, vegetables and whole grains. Poultry fish, nuts and beans are a healthy choice for protein rather then red meat. A low sodium diet and drinking 64 ounces of water daily is generally recommended. Oils and sweet should be limited. Carbohydrates especially for those who are diabetic or overweight, should be limited to 45 to 60 gram per meal. It is important to eat on a regular schedule, at least 3 times daily. Snacks should be primarily fruits, vegetables or nuts.

## 2013-03-18 NOTE — Assessment & Plan Note (Signed)
Stable continue inhaler

## 2013-03-18 NOTE — Assessment & Plan Note (Signed)
Improved, normal blood sugar avg on recent test Pt congratulated and encouraged to continue same

## 2013-03-18 NOTE — Progress Notes (Signed)
  Subjective:    Patient ID: [name], female    DOB: [birth year], 72 y.o.   MRN: [id number]  HPI The PT is here for follow up and re-evaluation of chronic medical conditions, medication management and review of any available recent lab and radiology data.  Preventive health is updated, specifically  Cancer screening and Immunization.   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 Uses omeprazole for reflux The PT denies any adverse reactions to current medications since the last visit.  1 week h/o progressive nasal congestion and cough productive of yellow sputum, also uncontrolled allergy symptoms. States she has been coughing for 1 month, also c/o bilateral ear pain      Review of Systems See HPI Denies recent fever has had  chills. Denies chest pains, palpitations and leg swelling Denies abdominal pain, nausea, vomiting,diarrhea or constipation.   Denies dysuria, frequency, hesitancy or incontinence. Denies joint pain, swelling and limitation in mobility. Denies headaches, seizures, numbness, or tingling. Denies depression, anxiety or insomnia. Denies skin break down or rash.        Objective:   Physical Exam Patient alert and oriented and in no cardiopulmonary distress.  HEENT: No facial asymmetry, EOMI, no sinus tenderness,  oropharynx pink and moist.  Neck supple no adenopathy.TM clear bilaterally  Chest: Adequate air entry scattered crackles no wheezes  CVS: S1, S2 no murmurs, no S3.  ABD: Soft non tender. Bowel sounds normal.  Ext: No edema  MS: Adequate ROM spine, shoulders, hips and knees.  Skin: Intact, no ulcerations or rash noted.  Psych: Good eye contact, normal affect. Memory intact not anxious or depressed appearing.  CNS: CN 2-12 intact, power, tone and sensation normal throughout.        Assessment & Plan:

## 2013-03-18 NOTE — Assessment & Plan Note (Signed)
10 day course of antibiotics prescribed

## 2013-03-19 ENCOUNTER — Ambulatory Visit: Payer: Medicare Other | Admitting: Family Medicine

## 2013-04-25 ENCOUNTER — Other Ambulatory Visit: Payer: Self-pay | Admitting: Family Medicine

## 2013-04-29 ENCOUNTER — Ambulatory Visit (INDEPENDENT_AMBULATORY_CARE_PROVIDER_SITE_OTHER): Payer: Medicare Other

## 2013-04-29 ENCOUNTER — Encounter (INDEPENDENT_AMBULATORY_CARE_PROVIDER_SITE_OTHER): Payer: Self-pay

## 2013-04-29 DIAGNOSIS — Z23 Encounter for immunization: Secondary | ICD-10-CM

## 2013-05-20 ENCOUNTER — Encounter: Payer: Self-pay | Admitting: Orthopedic Surgery

## 2013-05-20 ENCOUNTER — Ambulatory Visit (INDEPENDENT_AMBULATORY_CARE_PROVIDER_SITE_OTHER): Payer: Medicare Other

## 2013-05-20 ENCOUNTER — Ambulatory Visit (INDEPENDENT_AMBULATORY_CARE_PROVIDER_SITE_OTHER): Payer: Medicare Other | Admitting: Orthopedic Surgery

## 2013-05-20 VITALS — BP 133/77 | Ht 66.0 in | Wt 220.0 lb

## 2013-05-20 DIAGNOSIS — M25569 Pain in unspecified knee: Secondary | ICD-10-CM

## 2013-05-20 DIAGNOSIS — M25562 Pain in left knee: Secondary | ICD-10-CM

## 2013-05-20 DIAGNOSIS — M1710 Unilateral primary osteoarthritis, unspecified knee: Secondary | ICD-10-CM | POA: Insufficient documentation

## 2013-05-20 DIAGNOSIS — M179 Osteoarthritis of knee, unspecified: Secondary | ICD-10-CM | POA: Insufficient documentation

## 2013-05-20 NOTE — Patient Instructions (Addendum)
Arthritis of the knee   Osteoarthritis Osteoarthritis is the most common form of arthritis. It is redness, soreness, and swelling (inflammation) affecting the cartilage. Cartilage acts as a cushion, covering the ends of bones where they meet to form a joint. CAUSES  Over time, the cartilage begins to wear away. This causes bone to rub on bone. This produces pain and stiffness in the affected joints. Factors that contribute to this problem are:  Excessive body weight.  Age.  Overuse of joints. SYMPTOMS   People with osteoarthritis usually experience joint pain, swelling, or stiffness.  Over time, the joint may lose its normal shape.  Small deposits of bone (osteophytes) may grow on the edges of the joint.  Bits of bone or cartilage can break off and float inside the joint space. This may cause more pain and damage.  Osteoarthritis can lead to depression, anxiety, feelings of helplessness, and limitations on daily activities. The most commonly affected joints are in the:  Ends of the fingers.  Thumbs.  Neck.  Lower back.  Knees.  Hips. DIAGNOSIS  Diagnosis is mostly based on your symptoms and exam. Tests may be helpful, including:  X-rays of the affected joint.  A computerized magnetic scan (MRI).  Blood tests to rule out other types of arthritis.  Joint fluid tests. This involves using a needle to draw fluid from the joint and examining the fluid under a microscope. TREATMENT  Goals of treatment are to control pain, improve joint function, maintain a normal body weight, and maintain a healthy lifestyle. Treatment approaches may include:  A prescribed exercise program with rest and joint relief.  Weight control with nutritional education.  Pain relief techniques such as:  Properly applied heat and cold.  Electric pulses delivered to nerve endings under the skin (transcutaneous electrical nerve stimulation, TENS).  Massage.  Certain supplements. Ask your  caregiver before using any supplements, especially in combination with prescribed drugs.  Medicines to control pain, such as:  Acetaminophen.  Nonsteroidal anti-inflammatory drugs (NSAIDs), such as naproxen.  Narcotic or central-acting agents, such as tramadol. This drug carries a risk of addiction and is generally prescribed for short-term use.  Corticosteroids. These can be given orally or as injection. This is a short-term treatment, not recommended for routine use.  Surgery to reposition the bones and relieve pain (osteotomy) or to remove loose pieces of bone and cartilage. Joint replacement may be needed in advanced states of osteoarthritis. HOME CARE INSTRUCTIONS  Your caregiver can recommend specific types of exercise. These may include:  Strengthening exercises. These are done to strengthen the muscles that support joints affected by arthritis. They can be performed with weights or with exercise bands to add resistance.  Aerobic activities. These are exercises, such as brisk walking or low-impact aerobics, that get your heart pumping. They can help keep your lungs and circulatory system in shape.  Range-of-motion activities. These keep your joints limber.  Balance and agility exercises. These help you maintain daily living skills. Learning about your condition and being actively involved in your care will help improve the course of your osteoarthritis. SEEK MEDICAL CARE IF:   You feel hot or your skin turns red.  You develop a rash in addition to your joint pain.  You have an oral temperature above 102 F (38.9 C). FOR MORE INFORMATION  National Institute of Arthritis and Musculoskeletal and Skin Diseases: www.niams.http://www.myers.net/ [employer] on Aging: https://walker.com/ American College of Rheumatology: www.rheumatology.org Document Released: 07/09/2005 Document Revised: 10/01/2011 Document Reviewed: 10/20/2009  [employer] Patient Information 2014 [place], [place]. Wear and  Tear Disorders of the Knee (Arthritis, Osteoarthritis) Everyone will experience wear and tear injuries (arthritis, osteoarthritis) of the knee. These are the changes we all get as we age. They come from the joint stress of daily living. The amount of cartilage damage in your knee and your symptoms determine if you need surgery. Mild problems require approximately two months recovery time. More severe problems take several months to recover. With mild problems, your surgeon may find worn and rough cartilage surfaces. With severe changes, your surgeon may find cartilage that has completely worn away and exposed the bone. Loose bodies of bone and cartilage, bone spurs (excess bone growth), and injuries to the menisci (cushions between the large bones of your leg) are also common. All of these problems can cause pain. For a mild wear and tear problem, rough cartilage may simply need to be shaved and smoothed. For more severe problems with areas of exposed bone, your surgeon may use an instrument for roughing up the bone surfaces to stimulate new cartilage growth. Loose bodies are usually removed. Torn menisci may be trimmed or repaired. ABOUT THE ARTHROSCOPIC PROCEDURE Arthroscopy is a surgical technique. It allows your orthopedic surgeon to diagnose and treat your knee injury with accuracy. The surgeon looks into your knee through a small scope. The scope is like a small (pencil-sized) telescope. Arthroscopy is less invasive than open knee surgery. You can expect a more rapid recovery. After the procedure, you will be moved to a recovery area until most of the effects of the medication have worn off. Your caregiver will discuss the test results with you. RECOVERY The severity of the arthritis and the type of procedure performed will determine recovery time. Other important factors include age, physical condition, medical conditions, and the type of rehabilitation program. Strengthening your muscles after  arthroscopy helps guarantee a better recovery. Follow your caregiver's instructions. Use crutches, rest, elevate, ice, and do knee exercises as instructed. Your caregivers will help you and instruct you with exercises and other physical therapy required to regain your mobility, muscle strength, and functioning following surgery. Only take over-the-counter or prescription medicines for pain, discomfort, or fever as directed by your caregiver.  SEEK MEDICAL CARE IF:   There is increased bleeding (more than a small spot) from the wound.  You notice redness, swelling, or increasing pain in the wound.  Pus is coming from wound.  You develop an unexplained oral temperature above 102 F (38.9 C) , or as your caregiver suggests.  You notice a foul smell coming from the wound or dressing.  You have severe pain with motion of the knee. SEEK IMMEDIATE MEDICAL CARE IF:   You develop a rash.  You have difficulty breathing.  You have any allergic problems. MAKE SURE YOU:   Understand these instructions.  Will watch your condition.  Will get help right away if you are not doing well or get worse. Document Released: 07/06/2000 Document Revised: 10/01/2011 Document Reviewed: 12/03/2007 [facility] Patient Information 2014 [place], [place].

## 2013-05-20 NOTE — Progress Notes (Signed)
  Subjective:    Patient ID: [name], female    DOB: 1940/12/26, 72 y.o.   MRN: [id number]  Chief Complaint  Patient presents with  . Knee Pain    Left knee pain, no injury    Knee Pain    72-year-old female who presents with a acute onset of pain in her left knee in September of this year previous history of knee problems in the past did well for several years. I last saw her 7 years ago. However since she made the appointment the pain is stopped. She did notice some swelling and pain after sitting or standing for long periods of time she got good relief with ice her pain was mild to moderate at the time  She has a review of systems which indicates she's gained some weight, she has some shortness of breath with cough. Heartburn. Joint pain and stiffness. Itching. Dry skin. Seasonal allergies. Remaining system review was normal. She denies any medical problems. She had right knee surgery she had cholecystectomy. She takes Advair aspirin Caltrate probe a statin  Family history of arthritis  Social history widowed.  She does smoke 3-4 cigarettes per day does not drink    Review of Systems     Objective:   Physical Exam Vital signs BP 133/77  Ht 5\' 6"  (1.676 m)  Wt 220 lb (99.791 kg)  BMI 35.53 kg/m2  General appearance: the patient is well-developed and well-nourished, grooming and hygiene are normal, body habitus BP 133/77  Ht 5\' 6"  (1.676 m)  Wt 220 lb (99.791 kg)  BMI 35.53 kg/m2   The patient is alert and oriented x 3; mood and affect are normal  Ambulatory status normal without assistive devices  /Left knee Inspection of the left knee shows no tenderness pain or swelling Range of motion full range of motion The Lachman test is normal the anterior and posterior drawer tests are normal and the collateral ligaments are stable Motor exam 5/5 Skin normal; no rash or laceration  McMurray's sign negative  The right knee Inspection revealed no tenderness, ROM was  normal and motor exam grade 5/5 quad strength. Ligaments were stable   Cardiovascular exam normal pulse and perfusion without edema tenderness or varicose veins  Sensory exam is normal       Assessment & Plan:  The x-ray shows severe wear of the medial compartment multiple osteophytes joint space narrowing etc. consistent with severe osteoarthritis  However this point she is asymptomatic  She'll followup with [place] if symptoms come back

## 2013-06-15 ENCOUNTER — Ambulatory Visit (HOSPITAL_COMMUNITY)
Admission: RE | Admit: 2013-06-15 | Discharge: 2013-06-15 | Disposition: A | Discharge status: Home / Self Care | Payer: Medicare Other | Source: Ambulatory Visit | Attending: Family Medicine | Admitting: Family Medicine

## 2013-06-15 ENCOUNTER — Encounter: Payer: Self-pay | Admitting: Family Medicine

## 2013-06-15 ENCOUNTER — Ambulatory Visit (INDEPENDENT_AMBULATORY_CARE_PROVIDER_SITE_OTHER): Payer: Medicare Other | Admitting: Family Medicine

## 2013-06-15 VITALS — BP 130/80 | HR 96 | Resp 16 | Ht 66.0 in | Wt 220.0 lb

## 2013-06-15 DIAGNOSIS — E669 Obesity, unspecified: Secondary | ICD-10-CM

## 2013-06-15 DIAGNOSIS — M47817 Spondylosis without myelopathy or radiculopathy, lumbosacral region: Secondary | ICD-10-CM | POA: Insufficient documentation

## 2013-06-15 DIAGNOSIS — IMO0002 Reserved for concepts with insufficient information to code with codable children: Secondary | ICD-10-CM

## 2013-06-15 DIAGNOSIS — R7309 Other abnormal glucose: Secondary | ICD-10-CM

## 2013-06-15 DIAGNOSIS — M51379 Other intervertebral disc degeneration, lumbosacral region without mention of lumbar back pain or lower extremity pain: Secondary | ICD-10-CM | POA: Insufficient documentation

## 2013-06-15 DIAGNOSIS — I10 Essential (primary) hypertension: Secondary | ICD-10-CM

## 2013-06-15 DIAGNOSIS — E785 Hyperlipidemia, unspecified: Secondary | ICD-10-CM

## 2013-06-15 DIAGNOSIS — Z13820 Encounter for screening for osteoporosis: Secondary | ICD-10-CM

## 2013-06-15 DIAGNOSIS — M5137 Other intervertebral disc degeneration, lumbosacral region: Secondary | ICD-10-CM | POA: Insufficient documentation

## 2013-06-15 DIAGNOSIS — R7303 Prediabetes: Secondary | ICD-10-CM

## 2013-06-15 DIAGNOSIS — N390 Urinary tract infection, site not specified: Secondary | ICD-10-CM

## 2013-06-15 DIAGNOSIS — M546 Pain in thoracic spine: Secondary | ICD-10-CM | POA: Insufficient documentation

## 2013-06-15 DIAGNOSIS — E559 Vitamin D deficiency, unspecified: Secondary | ICD-10-CM

## 2013-06-15 DIAGNOSIS — N3000 Acute cystitis without hematuria: Secondary | ICD-10-CM

## 2013-06-15 DIAGNOSIS — R5381 Other malaise: Secondary | ICD-10-CM

## 2013-06-15 DIAGNOSIS — M549 Dorsalgia, unspecified: Secondary | ICD-10-CM

## 2013-06-15 DIAGNOSIS — J449 Chronic obstructive pulmonary disease, unspecified: Secondary | ICD-10-CM

## 2013-06-15 DIAGNOSIS — M545 Low back pain, unspecified: Secondary | ICD-10-CM | POA: Insufficient documentation

## 2013-06-15 LAB — POCT URINALYSIS DIPSTICK
Glucose, UA: NEGATIVE
Ketones, UA: NEGATIVE
Leukocytes, UA: NEGATIVE
Nitrite, UA: NEGATIVE
Spec Grav, UA: 1.025
Urobilinogen, UA: 0.2
pH, UA: 6

## 2013-06-15 LAB — BASIC METABOLIC PANEL
BUN: 14 mg/dL (ref 6–23)
CO2: 31 mEq/L (ref 19–32)
Chloride: 106 mEq/L (ref 96–112)
Creat: 1.05 mg/dL (ref 0.50–1.10)
Potassium: 4.2 mEq/L (ref 3.5–5.3)

## 2013-06-15 MED ORDER — IBUPROFEN 800 MG PO TABS: ORAL_TABLET | ORAL | Status: AC

## 2013-06-15 MED ORDER — CYCLOBENZAPRINE HCL 5 MG PO TABS: ORAL_TABLET | ORAL | Status: AC

## 2013-06-15 MED ORDER — KETOROLAC TROMETHAMINE 60 MG/2ML IM SOLN
60.0000 mg | Freq: Once | INTRAMUSCULAR | Status: AC
  Administered 2013-06-15: 60 mg via INTRAMUSCULAR

## 2013-06-15 MED ORDER — PREDNISONE 5 MG PO TABS: 5.0000 mg | ORAL_TABLET | Freq: Two times a day (BID) | ORAL | Status: AC

## 2013-06-15 MED ORDER — METHYLPREDNISOLONE ACETATE 80 MG/ML IJ SUSP
80.0000 mg | Freq: Once | INTRAMUSCULAR | Status: AC
  Administered 2013-06-15: 80 mg via INTRAMUSCULAR

## 2013-06-15 NOTE — Patient Instructions (Addendum)
F/u in January as before  Urine shows no sign of infection, continue to drink alotof water.  Back pain appears to be from arthritis in your back  Please get X rays today, also labs needed today are chem 7, TSh and vit D, and HBA1C  NO labs in January please  Pls stop at front for appt for bone density  Toradol 60mg  and depo medrol 80 mg IM in the office today  Medications (3) will be sent to your pharmacy today also  You are referred for physical therapy for back pain, the dept will call you

## 2013-06-15 NOTE — Assessment & Plan Note (Addendum)
1 month h/o urinary frequency, moreso at night, urinalysis in office negative for infection

## 2013-06-15 NOTE — Progress Notes (Signed)
  Subjective:    Patient ID: [name], female    DOB: July 19, 1941, 72 y.o.   MRN: [id number]  HPI Pt in for main c/o back pain x 1 month, generally non radiating in low back and  Right flank. Denies any known aggravting factor, relief is not sustained with use i of tylenol. Denies lower extremity weakness or numbness, or incontinence of bowel or bladder C/o urinary frequency esp at night worsening in the past 2 weeks, also flank pain. States passes large  Quantities of urine, no significant pain on urination, no fever or chills   Review of Systems See HPI Denies recent fever or chills. Denies sinus pressure, nasal congestion, ear pain or sore throat. Denies chest congestion, productive cough or wheezing. Denies chest pains, palpitations and leg swelling Denies abdominal pain, nausea, vomiting,diarrhea or constipation.    Denies headaches, seizures, numbness, or tingling. Denies depression, anxiety or insomnia. Denies skin break down or rash.        Objective:   Physical Exam Patient alert and oriented and in no cardiopulmonary distress.  HEENT: No facial asymmetry, EOMI, no sinus tenderness,  oropharynx pink and moist.  Neck adequate ROM, no adenopathy.  Chest: Clear to auscultation bilaterally.  CVS: S1, S2 no murmurs, no S3.  ABD: Soft . Bowel sounds normal.Right flank tenderness  Ext: No edema  MS: decreased  ROM spine, Adequate in shoulders, hips and knees.  Skin: Intact, no ulcerations or rash noted.  Psych: Good eye contact, normal affect. Memory intact not anxious or depressed appearing.  CNS: CN 2-12 intact, power, tone and sensation normal throughout.        Assessment & Plan:

## 2013-06-20 NOTE — Assessment & Plan Note (Addendum)
Patient educated about the importance of limiting  Carbohydrate intake , the need to commit to daily physical activity for a minimum of 30 minutes , and to commit weight loss. The fact that changes in all these areas will reduce or eliminate all together the development of diabetes is stressed.  Deteriorated

## 2013-06-20 NOTE — Assessment & Plan Note (Signed)
Updated lab for next visit. Hyperlipidemia:Low fat diet discussed and encouraged.  uncontrolled when last checked

## 2013-06-20 NOTE — Assessment & Plan Note (Signed)
Uncontrolled and incrased x 1 month, xrays , PT , and anti inflammatories

## 2013-06-20 NOTE — Assessment & Plan Note (Signed)
Improved with daily use of advair

## 2013-06-20 NOTE — Assessment & Plan Note (Signed)
Deteriorated. Patient re-educated about  the importance of commitment to a  minimum of 150 minutes of exercise per week. The importance of healthy food choices with portion control discussed. Encouraged to start a food diary, count calories and to consider  joining a support group. Sample diet sheets offered. Goals set by the patient for the next several months.    

## 2013-06-20 NOTE — Assessment & Plan Note (Signed)
Controlled, no change in medication  

## 2013-06-23 ENCOUNTER — Ambulatory Visit (HOSPITAL_COMMUNITY)
Admission: RE | Admit: 2013-06-23 | Discharge: 2013-06-23 | Disposition: A | Discharge status: Home / Self Care | Payer: Medicare Other | Source: Ambulatory Visit | Attending: Family Medicine | Admitting: Family Medicine

## 2013-06-23 DIAGNOSIS — Z780 Asymptomatic menopausal state: Secondary | ICD-10-CM | POA: Insufficient documentation

## 2013-06-23 DIAGNOSIS — R29890 Loss of height: Secondary | ICD-10-CM | POA: Insufficient documentation

## 2013-06-23 DIAGNOSIS — Z13820 Encounter for screening for osteoporosis: Secondary | ICD-10-CM

## 2013-07-02 ENCOUNTER — Encounter: Payer: Self-pay | Admitting: Gastroenterology

## 2013-08-04 ENCOUNTER — Encounter (INDEPENDENT_AMBULATORY_CARE_PROVIDER_SITE_OTHER): Payer: Self-pay

## 2013-08-04 ENCOUNTER — Encounter: Payer: Self-pay | Admitting: Family Medicine

## 2013-08-04 ENCOUNTER — Ambulatory Visit (INDEPENDENT_AMBULATORY_CARE_PROVIDER_SITE_OTHER): Payer: Medicare Other | Admitting: Family Medicine

## 2013-08-04 VITALS — BP 132/78 | HR 100 | Resp 18 | Ht 66.0 in | Wt 228.1 lb

## 2013-08-04 DIAGNOSIS — J449 Chronic obstructive pulmonary disease, unspecified: Secondary | ICD-10-CM

## 2013-08-04 DIAGNOSIS — R7303 Prediabetes: Secondary | ICD-10-CM

## 2013-08-04 DIAGNOSIS — K219 Gastro-esophageal reflux disease without esophagitis: Secondary | ICD-10-CM

## 2013-08-04 DIAGNOSIS — E8881 Metabolic syndrome: Secondary | ICD-10-CM

## 2013-08-04 DIAGNOSIS — E785 Hyperlipidemia, unspecified: Secondary | ICD-10-CM

## 2013-08-04 DIAGNOSIS — I10 Essential (primary) hypertension: Secondary | ICD-10-CM

## 2013-08-04 DIAGNOSIS — J309 Allergic rhinitis, unspecified: Secondary | ICD-10-CM

## 2013-08-04 DIAGNOSIS — IMO0002 Reserved for concepts with insufficient information to code with codable children: Secondary | ICD-10-CM

## 2013-08-04 DIAGNOSIS — R7309 Other abnormal glucose: Secondary | ICD-10-CM

## 2013-08-04 DIAGNOSIS — F17200 Nicotine dependence, unspecified, uncomplicated: Secondary | ICD-10-CM

## 2013-08-04 MED ORDER — TIZANIDINE HCL 4 MG PO CAPS: ORAL_CAPSULE | ORAL | Status: DC

## 2013-08-04 MED ORDER — NABUMETONE 500 MG PO TABS: 500.0000 mg | ORAL_TABLET | Freq: Two times a day (BID) | ORAL | Status: DC | PRN

## 2013-08-04 NOTE — Patient Instructions (Addendum)
F/u in 4 month, call if you need me before  No more e cigarettes by Feb 10, and no more nicotine  Gum , 2mg  one daily by  September 20, 2013  You will get alot of info on free smoking cessation classes at [facility], also quit line number  It is important that you exercise regularly at least 30 minutes 5 times a week. If you develop chest pain, have severe difficulty breathing, or feel very tired, stop exercising immediately and seek medical attention   Fasting lipid, cmp, HBA1C, for Marcxh visit

## 2013-08-05 ENCOUNTER — Other Ambulatory Visit: Payer: Self-pay

## 2013-08-05 DIAGNOSIS — IMO0002 Reserved for concepts with insufficient information to code with codable children: Secondary | ICD-10-CM

## 2013-08-05 MED ORDER — TIZANIDINE HCL 4 MG PO TABS: 4.0000 mg | ORAL_TABLET | Freq: Every evening | ORAL | Status: DC | PRN

## 2013-08-06 ENCOUNTER — Other Ambulatory Visit: Payer: Self-pay

## 2013-08-06 DIAGNOSIS — J309 Allergic rhinitis, unspecified: Secondary | ICD-10-CM

## 2013-08-06 MED ORDER — FLUTICASONE PROPIONATE 50 MCG/ACT NA SUSP: 2.0000 | Freq: Every day | NASAL | Status: DC

## 2013-08-06 MED ORDER — HYDROCHLOROTHIAZIDE 25 MG PO TABS: ORAL_TABLET | ORAL | Status: DC

## 2013-08-09 DIAGNOSIS — E8881 Metabolic syndrome: Secondary | ICD-10-CM | POA: Insufficient documentation

## 2013-08-09 NOTE — Assessment & Plan Note (Signed)
Controlled, no change in medication  

## 2013-08-09 NOTE — Assessment & Plan Note (Signed)
Controlled, no change in medication DASH diet and commitment to daily physical activity for a minimum of 30 minutes discussed and encouraged, as a part of hypertension management. The importance of attaining a healthy weight is also discussed.  

## 2013-08-09 NOTE — Progress Notes (Signed)
   Subjective:    Patient ID: [name], female    DOB: 1940-11-04, 73 y.o.   MRN: [id number]  HPI The PT is here for follow up and re-evaluation of chronic medical conditions, medication management and review of any available recent lab and radiology data.  Preventive health is updated, specifically  Cancer screening and Immunization.   Questions or concerns regarding consultations or procedures which the PT has had in the interim are  addressed. The PT denies any adverse reactions to current medications since the last visit.  There are no new concerns.  C/o intermittent flare in back pain , needs med for this , also intermittent nasal congestion and drainage in past several weeks with onset of Winter    Review of Systems See HPI Denies recent fever or chills. Denies sinus pressure,  ear pain or sore throat. Denies chest congestion, productive cough or wheezing. Denies chest pains, palpitations and leg swelling Denies abdominal pain, nausea, vomiting,diarrhea or constipation.   Denies dysuria, frequency, hesitancy or incontinence. Denies headaches, seizures, numbness, or tingling. Denies depression, anxiety or insomnia. Denies skin break down or rash.        Objective:   Physical Exam  Patient alert and oriented and in no cardiopulmonary distress.  HEENT: No facial asymmetry, EOMI, no sinus tenderness,  oropharynx pink and moist.  Neck supple no adenopathy.Eryhtema and edema of nasal mucosa  Chest: Clear to auscultation bilaterally.  CVS: S1, S2 no murmurs, no S3.  ABD: Soft non tender. Bowel sounds normal.  Ext: No edema  MS: Adequate though reduced ROM lumbar adequate in  shoulders, hips and knees.  Skin: Intact, no ulcerations or rash noted.  Psych: Good eye contact, normal affect. Memory intact not anxious or depressed appearing.  CNS: CN 2-12 intact, power, tone and sensation normal throughout.       Assessment & Plan:

## 2013-08-09 NOTE — Assessment & Plan Note (Signed)
Intermittent flares relafen for as needed use, no current flare

## 2013-08-09 NOTE — Assessment & Plan Note (Signed)
The increased risk of cardiovascular disease associated with this diagnosis, and the need to consistently work on lifestyle to change this is discussed. Following  a  heart healthy diet ,commitment to 30 minutes of exercise at least 5 days per week, as well as control of blood sugar and cholesterol , and achieving a healthy weight are all the areas to be addressed .  

## 2013-08-09 NOTE — Assessment & Plan Note (Signed)
Unchanged needs to stop e cigarettes committed to doinfg so at this time, which is great and quit date set Patient counseled for approximately 5 minutes regarding the health risks of ongoing nicotine use, specifically all types of cancer, heart disease, stroke and respiratory failure. The options available for help with cessation ,the behavioral changes to assist the process, and the option to either gradully reduce usage  Or abruptly stop.is also discussed. Pt is also encouraged to set specific goals in number of cigarettes used daily, as well as to set a quit date.

## 2013-08-09 NOTE — Assessment & Plan Note (Signed)
Updated lab paat due  Hyperlipidemia:Low fat diet discussed and encouraged.

## 2013-08-09 NOTE — Assessment & Plan Note (Signed)
Uncontrolled  ,needs to commit to daily flonase

## 2013-08-09 NOTE — Assessment & Plan Note (Signed)
Patient educated about the importance of limiting  Carbohydrate intake , the need to commit to daily physical activity for a minimum of 30 minutes , and to commit weight loss. The fact that changes in all these areas will reduce or eliminate all together the development of diabetes is stressed.   Updated lab next visit

## 2013-08-17 ENCOUNTER — Ambulatory Visit (INDEPENDENT_AMBULATORY_CARE_PROVIDER_SITE_OTHER): Payer: Medicare Other | Admitting: Gastroenterology

## 2013-08-17 ENCOUNTER — Encounter: Payer: Self-pay | Admitting: Gastroenterology

## 2013-08-17 ENCOUNTER — Other Ambulatory Visit: Payer: Self-pay | Admitting: Gastroenterology

## 2013-08-17 VITALS — BP 140/85 | HR 109 | Temp 97.7°F | Wt 235.8 lb

## 2013-08-17 DIAGNOSIS — R1310 Dysphagia, unspecified: Secondary | ICD-10-CM

## 2013-08-17 DIAGNOSIS — K219 Gastro-esophageal reflux disease without esophagitis: Secondary | ICD-10-CM

## 2013-08-17 DIAGNOSIS — K5900 Constipation, unspecified: Secondary | ICD-10-CM

## 2013-08-17 MED ORDER — PANTOPRAZOLE SODIUM 40 MG PO TBEC
40.0000 mg | DELAYED_RELEASE_TABLET | Freq: Every day | ORAL | Status: DC
Start: 2013-08-17 — End: 2013-09-11

## 2013-08-17 MED ORDER — LINACLOTIDE 290 MCG PO CAPS: 290.0000 ug | ORAL_CAPSULE | Freq: Every day | ORAL | Status: DC

## 2013-08-17 NOTE — Progress Notes (Signed)
Referring Provider: Fayrene Helper, MD Primary Care Physician:  [name], MD Primary GI: Dr. Oneida Alar   Chief Complaint  Patient presents with  . Follow-up    HPI:   [name] presents today in follow-up after colonoscopy, EGD/ED. Due for surveillance colonoscopy in 10 years if health permits. Empiric dilation performed at time of EGD. She has steadily gained weight over the past year.  States she will eat something and it stops right in her epigastric region, gets short-winded. Feels like she has to burp it up. Took an [employer] yesterday. Uncomfortable, not painful. Sings in the choir, feels so bloated like she can't get her breath. No N/V. Prilosec daily, helpful. Loves oranges but doesn't fool with it. Drinks pineapple juice every now and then. Aleve for back pain, rarely. Prilosec chronically.   Constipation: takes something all the time. Prune juice blows her up. Doesn't go unless she takes something. Sometimes looks like something a little puppy dog would have (small).   Breakfast: doesn't normally eat, but if she does will have [place] bacon, eggs, toast with butter Lunch: leftovers Dinner: whatever is fixed for that Sunday. Baked chicken, greens, potatoes, onions, cornbread.  Loves tea and coffee. Caffeinated. No sugar or cream in coffee. Sweet tea. Eats dark chocolate. Loves chocolate. Loves broccoli. Eats a lot. Eats in the middle of the night. Loves cheese and crackers. Gets hunks of cheese at night.   Past Medical History  Diagnosis Date  . Sinusitis   . OA (osteoarthritis) of knee   . Nicotine addiction   . COPD (chronic obstructive pulmonary disease)   . Obesity   . Hypertension   . Lung nodule seen on imaging study     Past Surgical History  Procedure Laterality Date  . Knee arthroscopy right    . Cholecystectomy    . Total knee arthroplasty right  05/29/2005    Dr. Aline Brochure   . Bilateral cataract surgery  7/27 & 03/01/09    Dr. Gershon Crane      . Partial hysterectomy    . Esophagogastroduodenoscopy  11/07/2010    Jenkins:hiatal hernia/no evidence of Barrett esophagitis.  The Z-line was noted to be at 39 cm from the teeth. CLO test negative  . Colonoscopy  04/2004    Dr. Leane Para Smith-->melanosis coli  . Colonoscopy with esophagogastroduodenoscopy (egd) N/A 02/25/2013    Dr. Fields:Normal mucosa in the terminal ileum/Severe melanosis throughout the entire examined colon/ONE COLON POLYP REMOVED (tubular adenoma)/Mild diverticulosis  in the sigmoid colon/EGD:The mucosa of the esophagus appeared normal/Non-erosive gastritis, empiric dilation with Savary dilator  . Savory dilation N/A 02/25/2013    Procedure: SAVORY DILATION;  Surgeon: Danie Binder, MD;  [facility]: [facility];  Service: Endoscopy;  Laterality: N/A;  Venia Minks dilation N/A 02/25/2013    Procedure: Venia Minks DILATION;  Surgeon: Danie Binder, MD;  Location: [facility];  Service: Endoscopy;  Laterality: N/A;    Current Outpatient Prescriptions  Medication Sig Dispense Refill  . ADVAIR DISKUS 100-50 MCG/DOSE AEPB       . aspirin EC 81 MG tablet Take 81 mg by mouth daily.      . calcium-vitamin D (OSCAL WITH D) 500-200 MG-UNIT per tablet Take 1 tablet by mouth 2 (two) times daily.  180 tablet  3  . fluticasone (FLONASE) 50 MCG/ACT nasal spray Place 2 sprays into both nostrils daily.  16 g  3  . hydrochlorothiazide (HYDRODIURIL) 25 MG tablet TAKE ONE TABLET BY MOUTH  EVERY DAY  30 tablet  4  . nabumetone (RELAFEN) 500 MG tablet Take 1 tablet (500 mg total) by mouth 2 (two) times daily as needed for moderate pain.  40 tablet  2  . omeprazole (PRILOSEC OTC) 20 MG tablet Take 20 mg by mouth daily.      . pravastatin (PRAVACHOL) 40 MG tablet Take 1 tablet (40 mg total) by mouth every evening.  30 tablet  11  . tiZANidine (ZANAFLEX) 4 MG tablet Take 1 tablet (4 mg total) by mouth at bedtime as needed for muscle spasms.  30 tablet  3   No current facility-administered medications  for this visit.    Allergies as of 08/17/2013  . (No Known Allergies)    Family History  Problem Relation Age of Onset  . Diabetes Mother   . Hypertension Mother   . Coronary artery disease Mother   . Pneumonia Father   . Hypertension Sister   . Hypertension Sister   . Hypertension Brother   . Hypertension Brother   . Colon cancer Neg Hx   . Liver disease Neg Hx     History   Social History  . Marital Status: Widowed    Spouse Name: N/A    Number of Children: 4  . Years of Education: N/A   Occupational History  . employed     Social History Main Topics  . Smoking status: Current Every Day Smoker -- 10 years    Types: Cigarettes  . Smokeless tobacco: Never Used     Comment: pt currently uses E-Cigarettes  . Alcohol Use: No  . Drug Use: No  . Sexual Activity: None   Other Topics Concern  . None   Social History Narrative  . None    Review of Systems: As mentioned in HPI.   Physical Exam: BP 140/85  Pulse 109  Temp(Src) 97.7 F (36.5 C) (Oral)  Wt 235 lb 12.8 oz (106.958 kg) General:   Alert and oriented. No distress noted. Pleasant and cooperative.  Head:  Normocephalic and atraumatic. Heart:  S1, S2 present without murmurs, rubs, or gallops.  Abdomen:  +BS, soft, mild discomfort above umbilicus. Query ventral hernia versus significant rectus diastasis. and non-distended. No rebound or guarding. No HSM or masses noted. Msk:  Symmetrical without gross deformities. Normal posture. Extremities:  Without edema. Neurologic:  Alert and  oriented x4;  grossly normal neurologically. Skin:  Intact without significant lesions or rashes. Psych:  Alert and cooperative. Normal mood and affect.

## 2013-08-17 NOTE — Patient Instructions (Addendum)
Start taking Protonix once each morning, 30 minutes before breakfast. This is for reflux. Do not take Prilosec.  I have sent in Linzess capsules to your pharmacy for constipation. Take this once each morning, 30 minutes before breakfast. Take this daily, even if you start having better bowel movements. We have given you samples of a probiotic. You may find some over the counter such as Digestive Advantage, [employer] Colon Health, [employer] brand.  Please review the diet attached. Complete the xray. We will call with results. We will see you back in 3 months!  Lactose-Free Diet Lactose is a carbohydrate that is found mainly in milk and milk products, as well as in foods with added milk or whey. Lactose must be digested by the enzyme lactase in order to be used by the body. Lactose intolerance occurs when there is a shortage of lactase. When your body is not able to digest lactose, you may feel sick to your stomach (nausea), bloated, and have cramps, gas, and diarrhea. TYPES OF LACTASE DEFICIENCY  Primary lactase deficiency. This is the most common type. It is characterized by a slow decrease in lactase activity.  Secondary lactase deficiency. This occurs following injury to the small intestinal mucosa as a result of a disease or condition. It can also occur as a result of surgery or after treatment with antibiotic medicines or cancer drugs. Tolerance to lactose varies widely. Each person must determine how much milk can be consumed without developing symptoms. Drinking smaller portions of milk throughout the day may be helpful. Some studies suggest that slowing gastric emptying may help increase tolerance of milk products. This may be done by:  Consuming milk or milk products with a meal rather than alone.  Consuming milk with a higher fat content. There are many dairy products that may be tolerated better than milk by some people, including:  Cheese (especially aged cheese). The lactose content  is much lower than in milk.  Cultured dairy products, such as yogurt, buttermilk, cottage cheese, and sweet acidophilus milk (kefir). These products are usually well tolerated by lactase-deficient people. This is because the healthy bacteria help digest lactose.  Lactose-hydrolyzed milk. This product contains 40% to 90% less lactose than milk and may also be well tolerated. ADEQUACY These diets may be deficient in calcium, riboflavin, and vitamin D, according to the Recommended Dietary Allowances of the [employer]. Depending on individual tolerances and the use of milk substitutes, milk, or other dairy products, you may be able to meet these recommendations. SPECIAL NOTES  Lactose is a carbohydrate. The main food source for lactose is dairy products. Reading food labels is important. Many products contain lactose even when they are not made from milk. Look for the following words: whey, milk solids, dry milk solids, nonfat dry milk powder. Typical sources of lactose other than dairy products include breads, candies, cold cuts, prepared and processed foods, and commercial sauces and gravies.  All foods must be prepared without milk, cream, or other dairy foods.  A vitamin or mineral supplement may be necessary. Consult your caregiver or Registered Dietitian.  Lactose is also found in many prescription and over-the-counter medicines.  Soy milk and lactose-free supplements may be used as an alternative to milk. CHOOSING FOODS Breads and Starches  Allowed: Breads and rolls made without milk. [place], [place], or [place] bread. Soda crackers, graham crackers. Any crackers prepared without lactose. Cooked or dry cereals prepared without lactose (read labels). Any potatoes, pasta, or rice prepared without milk or  lactose. Popcorn.  Avoid: Breads and rolls that contain milk. Prepared mixes such as muffins, biscuits, waffles, pancakes. Sweet rolls, donuts, [place] toast (if made with milk  or lactose). Zwieback crackers, corn curls, or any crackers that contain lactose. Cooked or dry cereals prepared with lactose (read labels). Instant potatoes, frozen [place] fries, scalloped or au gratin potatoes. Vegetables  Allowed: Fresh, frozen, and canned vegetables.  Avoid: Creamed or breaded vegetables. Vegetables in a cheese sauce or with lactose-containing margarines. Fruit  Allowed: All fresh, canned, or frozen fruits that are not processed with lactose.  Avoid: Any canned or frozen fruits processed with lactose. Meat and Meat Substitutes  Allowed: Plain beef, chicken, fish, [place], lamb, veal, pork, or ham. Kosher prepared meat products. Strained or junior meats that do not contain milk. Eggs, soy meat substitutes, nuts.  Avoid: Scrambled eggs, omelets, and souffles that contain milk. Creamed or breaded meat, fish, or fowl. Sausage products such as wieners, liver sausage, or cold cuts that contain milk solids. Cheese, cottage cheese, or cheese spreads. Milk  Allowed: None.  Avoid: Milk (whole, 2%, skim, or chocolate). Evaporated, powdered, or condensed milk. Malted milk. Soups and Combination Foods  Allowed: Bouillon, broth, vegetable soups, clear soups, consomms. Homemade soups made with allowed ingredients. Combination or prepared foods that do not contain milk or milk products (read labels).  Avoid: Cream soups, chowders, commercially prepared soups containing lactose. Macaroni and cheese, pizza. Combination or prepared foods that contain milk or milk products. Desserts and Sweets  Allowed: Water and fruit ices, gelatin, angel food cake. Homemade cookies, pies, or cakes made from allowed ingredients. Pudding (if made with water or a milk substitute). Lactose-free tofu desserts. Sugar, honey, corn syrup, jam, jelly, marmalade, molasses (beet sugar). Pure sugar candy, marshmallows.  Avoid: Ice cream, ice milk, sherbet, custard, pudding, frozen yogurt. Commercial cake and  cookie mixes. Desserts that contain chocolate. Pie crust made with milk-containing margarine. Reduced calorie desserts made with a sugar substitute that contains lactose. Toffee, peppermint, butterscotch, chocolate, caramels. Fats and Oils  Allowed: Butter (as tolerated, contains very small amounts of lactose). Margarines and dressings that do not contain milk. Vegetable oils, shortening, mayonnaise, nondairy cream and whipped toppings without lactose or milk solids added. Berniece Salines.  Avoid: Margarines and salad dressings containing milk. Cream, cream cheese, peanut butter with added milk solids, sour cream, chip dips made with sour cream. Beverages  Allowed: Carbonated drinks, tea, coffee and freeze-dried coffee, some instant coffees (check labels). Fruit drinks, fruit and vegetable juice, rice or soy milk.  Avoid: Hot chocolate. Some cocoas, some instant coffees, instant iced teas, powdered fruit drinks (read labels). Condiments  Allowed: Soy sauce, carob powder, olives, gravy made with water, baker's cocoa, pickles, pure seasonings and spices, wine, pure monosodium glutamate, catsup, mustard.  Avoid: Some chewing gums, chocolate, some cocoas. Certain antibiotics and vitamin or mineral preparations. Spice blends if they contain milk products. MSG extender. Artificial sweeteners that contain lactose. Some nondairy creamers (read labels). SAMPLE MENU Breakfast  Orange juice.  Banana.  Bran cereal.  Nondairy creamer.  [place] bread, toasted.  Butter or milk-free margarine.  Coffee or tea. Lunch  Chicken breast.  Rice.  Green beans.  Butter or milk-free margarine.  Fresh melon.  Coffee or tea. Dinner  [employer].  Baked potato.  Butter or milk-free margarine.  Broccoli.  Lettuce salad with vinegar and oil dressing.  [employer]  Coffee or tea. Document Released: 12/29/2001 Document Revised: 10/01/2011 Document Reviewed: 10/06/2010 ExitCare Patient  Information  2014 [place], [place].   Diet for Gastroesophageal Reflux Disease, Adult Reflux is when stomach acid flows up into the esophagus. The esophagus becomes irritated and sore (inflammation). When reflux happens often and is severe, it is called gastroesophageal reflux disease (GERD). What you eat can help ease any discomfort caused by GERD. FOODS OR DRINKS TO AVOID OR LIMIT  Coffee and black tea, with or without caffeine.  Bubbly (carbonated) drinks with caffeine or energy drinks.  Strong spices, such as pepper, cayenne pepper, curry, or chili powder.  Peppermint or spearmint.  Chocolate.  High-fat foods, such as meats, fried food, oils, butter, or nuts.  Fruits and vegetables that cause discomfort. This includes citrus fruits and tomatoes.  Alcohol. If a certain food or drink irritates your GERD, avoid eating or drinking it. THINGS THAT MAY HELP GERD INCLUDE:  Eat meals slowly.  Eat 5 to 6 small meals a day, not 3 large meals.  Do not eat food for a certain amount of time if it causes discomfort.  Wait 3 hours after eating before lying down.  Keep the head of your bed raised 6 to 9 inches (15 23 centimeters). Put a foam wedge or blocks under the legs of the bed.  Stay active. Weight loss, if needed, may help ease your discomfort.  Wear loose-fitting clothing.  Do not smoke or chew tobacco. Document Released: 01/08/2012 Document Reviewed: 01/08/2012 [facility] Patient Information 2014 [place].

## 2013-08-18 ENCOUNTER — Ambulatory Visit (HOSPITAL_COMMUNITY)
Admission: RE | Admit: 2013-08-18 | Discharge: 2013-08-18 | Disposition: A | Discharge status: Home / Self Care | Payer: Medicare Other | Source: Ambulatory Visit | Attending: Family Medicine | Admitting: Family Medicine

## 2013-08-18 DIAGNOSIS — IMO0001 Reserved for inherently not codable concepts without codable children: Secondary | ICD-10-CM | POA: Insufficient documentation

## 2013-08-18 DIAGNOSIS — M545 Low back pain, unspecified: Secondary | ICD-10-CM | POA: Insufficient documentation

## 2013-08-18 DIAGNOSIS — J449 Chronic obstructive pulmonary disease, unspecified: Secondary | ICD-10-CM | POA: Insufficient documentation

## 2013-08-18 DIAGNOSIS — J4489 Other specified chronic obstructive pulmonary disease: Secondary | ICD-10-CM | POA: Insufficient documentation

## 2013-08-18 DIAGNOSIS — I10 Essential (primary) hypertension: Secondary | ICD-10-CM | POA: Insufficient documentation

## 2013-08-18 NOTE — Evaluation (Signed)
Physical Therapy Evaluation  Patient Details  Name: [name] MRN: [id number] Date of Birth: 1941-02-27  Today's Date: 08/18/2013 Time: 1300-1345 PT Time Calculation (min): 34 min [id number] PT initial evaluation charge              Visit#: 1 of 12  Re-eval: 09/17/13 Assessment Diagnosis: low back pain  Surgical Date: 05/23/13 Next MD Visit: dr. Moshe Cipro  Prior Therapy: no  Authorization: medicare united     Authorization Time Period:    Authorization Visit#: 1 of 10   Past Medical History:  Past Medical History  Diagnosis Date  . Sinusitis   . OA (osteoarthritis) of knee   . Nicotine addiction   . COPD (chronic obstructive pulmonary disease)   . Obesity   . Hypertension   . Lung nodule seen on imaging study    Past Surgical History:  Past Surgical History  Procedure Laterality Date  . Knee arthroscopy right    . Cholecystectomy    . Total knee arthroplasty right  05/29/2005    Dr. Aline Brochure   . Bilateral cataract surgery  7/27 & 03/01/09    Dr. Gershon Crane    . Partial hysterectomy    . Esophagogastroduodenoscopy  11/07/2010    Jenkins:hiatal hernia/no evidence of Barrett esophagitis.  The Z-line was noted to be at 39 cm from the teeth. CLO test negative  . Colonoscopy  04/2004    Dr. Leane Para Smith-->melanosis coli  . Colonoscopy with esophagogastroduodenoscopy (egd) N/A 02/25/2013    Dr. Fields:Normal mucosa in the terminal ileum/Severe melanosis throughout the entire examined colon/ONE COLON POLYP REMOVED (tubular adenoma)/Mild diverticulosis  in the sigmoid colon/EGD:The mucosa of the esophagus appeared normal/Non-erosive gastritis, empiric dilation with Savary dilator  . Savory dilation N/A 02/25/2013    Procedure: SAVORY DILATION;  Surgeon: Danie Binder, MD;  [facility];  Service: Endoscopy;  Laterality: N/A;  Venia Minks dilation N/A 02/25/2013    Procedure: Venia Minks DILATION;  Surgeon: Danie Binder, MD;  [facility];  Service: Endoscopy;   Laterality: N/A;    Subjective Symptoms/Limitations Symptoms: c/o lower back pain, worsening since Nov 1,2014, moving is better, static standing and sitting makes back pain worse, unknown cause, realated to arthritis  Pertinent History: 73 yr old female , right TKR , recent dx of high hiatal hernia   Limitations: Standing;Sitting How long can you sit comfortably?: 10 min  How long can you stand comfortably?: 5 min  Patient Stated Goals: decrease pain, less pain static standing at church , 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  Pain Assessment Currently in Pain?: Yes Pain Score: 5  Pain Location: Back Pain Type: Acute pain Pain Relieving Factors: moving around  Effect of Pain on Daily Activities: moving around   Precautions/Restrictions - hernia    Balance Screening Balance Screen Has the patient fallen in the past 6 months: No  Prior Functioning  Prior Function Level of Independence: Independent with basic ADLs Driving: Yes 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    Cognition/Observation Observation/Other Assessments Observations: decreased lumbar lordosis, lower right convex thoraicic curve   Sensation/Coordination/Flexibility/Functional Tests Coordination Gross Motor Movements are Fluid and Coordinated: Yes Functional Tests Functional Tests: FOTO next visit   Assessment Lumbar AROM Lumbar Flexion: ankle level  Lumbar Extension: 50 % impaired  Lumbar - Right Side Bend: 25 % impaired  Lumbar - Left Side Bend: 25% impaired  Lumbar Strength Lumbar Flexion: 2/5 Lumbar Extension: 2/5 Palpation Palpation: mild tenderness B PSIS  Physical Therapy Assessment and Plan PT Assessment and Plan Clinical Impression Statement: 73 yr old female referred for back pain, with onset Nov 2014 correlated to increased standing and house work arounf the holidays. She has lumbar pain realted to arthritis without radicular symptoms. she does have core wekaness and  current diagnosis of hernia.  Patients pain is associated with maintaining static positions such as static standing, sitting.  Pt will benefit from skilled therapeutic intervention in order to improve on the following deficits: Decreased range of motion;Decreased strength;Pain Rehab Potential: Good PT Frequency: Min 2X/week PT Duration: 6 weeks PT Treatment/Interventions: Therapeutic activities;Therapeutic exercise;Manual techniques;Neuromuscular re-education;Balance training;Modalities;Patient/family education PT Plan: core stabilization, hip strengthening     Goals Home Exercise Program Pt/caregiver will Perform Home Exercise Program: For increased strengthening;Independently PT Goal: Perform Home Exercise Program - Progress: Goal set today PT Short Term Goals Time to Complete Short Term Goals: 3 weeks PT Short Term Goal 1: stand for 5 min comfortably in place max pain 2/10 for decreased pain standing at church and store  PT Short Term Goal 2: patient reporting walking 10 min without increased symtpoms  PT Long Term Goals Time to Complete Long Term Goals:  (6 weeks ) PT Long Term Goal 1: demonstrate proper body mechancis supine to sit to reduce strain on low back within 6 weeks  PT Long Term Goal 2: demonstrate proper body mechnaics during household ADLS such as vacumming and laundry to reduce strain on low back within 6 weeks  Long Term Goal 3: pain free lumbar spine during dressing ADLS and basic household chores   Problem List Patient Active Problem List   Diagnosis Date Noted  . Low back pain 08/18/2013  . Metabolic syndrome X 62/95/2841  . OA (osteoarthritis) of knee 05/20/2013  . Abdominal pain, epigastric 02/12/2013  . Unspecified constipation 02/12/2013  . GERD (gastroesophageal reflux disease) 02/12/2013  . Other dysphagia 02/12/2013  . Routine general medical examination at a health care facility 10/19/2012  . COPD (chronic obstructive pulmonary disease) 08/25/2011  .  Onychomycosis of toenail 06/25/2011  . Allergic rhinitis 11/23/2010  . Vitamin D deficiency 11/23/2010  . KNEE PAIN 05/30/2010  . ANSERINE BURSITIS, LEFT 05/30/2010  . DISORDER OF BONE AND CARTILAGE UNSPECIFIED 02/15/2010  . BACK PAIN WITH RADICULOPATHY 08/22/2009  . FATIGUE 06/21/2008  . Prediabetes 05/31/2008  . UNSPECIFIED VISUAL LOSS 04/19/2008  . Other and unspecified hyperlipidemia 03/02/2008  . OBESITY 10/30/2007  . NICOTINE ADDICTION 10/30/2007  . HYPERTENSION 10/30/2007  . Osteoarth NOS-L/Leg 10/30/2007    General Behavior During Therapy: WFL for tasks assessed/performed PT Plan of Care Consulted and Agree with Plan of Care: Patient  GP Functional Assessment Tool Used: professional judgement + PT exam  Functional Limitation: Changing and maintaining body position Changing and Maintaining Body Position Current Status (L2440): At least 40 percent but less than 60 percent impaired, limited or restricted Changing and Maintaining Body Position Goal Status (N0272): At least 20 percent but less than 40 percent impaired, limited or restricted  Victoria Euceda 08/18/2013, 4:20 PM  Physician Documentation Your signature is required to indicate approval of the treatment plan as stated above.  Please sign and either send electronically or make a copy of this report for your files and return this physician signed original.   Please mark one 1.__approve of plan  2. ___approve of plan with the following conditions.   ______________________________  _____________________ Physician Signature                                                                                                             Date

## 2013-08-19 ENCOUNTER — Ambulatory Visit (HOSPITAL_COMMUNITY)
Admission: RE | Admit: 2013-08-19 | Discharge: 2013-08-19 | Disposition: A | Discharge status: Home / Self Care | Payer: Medicare Other | Source: Ambulatory Visit | Attending: Gastroenterology | Admitting: Gastroenterology

## 2013-08-19 DIAGNOSIS — K228 Other specified diseases of esophagus: Secondary | ICD-10-CM | POA: Insufficient documentation

## 2013-08-19 DIAGNOSIS — K2289 Other specified disease of esophagus: Secondary | ICD-10-CM | POA: Insufficient documentation

## 2013-08-19 DIAGNOSIS — R1310 Dysphagia, unspecified: Secondary | ICD-10-CM | POA: Insufficient documentation

## 2013-08-19 DIAGNOSIS — K224 Dyskinesia of esophagus: Secondary | ICD-10-CM | POA: Insufficient documentation

## 2013-08-19 NOTE — Assessment & Plan Note (Signed)
With epigastric discomfort, belching, bloating. Likely multifactorial and related to dietary behaviors, weight gain. Trial of Protonix daily. Stop Prilosec. BPE for further assessment of food "stopping" in epigastric region. Offered nutrition referral but declined. 3 month follow-up.

## 2013-08-19 NOTE — Assessment & Plan Note (Signed)
Trial of Linzess 290 mcg daily. Colonoscopy up-to-date. 3 month follow-up.

## 2013-08-20 ENCOUNTER — Ambulatory Visit (HOSPITAL_COMMUNITY)
Admission: RE | Admit: 2013-08-20 | Discharge: 2013-08-20 | Disposition: A | Discharge status: Home / Self Care | Payer: Medicare Other | Source: Ambulatory Visit | Attending: Family Medicine | Admitting: Family Medicine

## 2013-08-20 DIAGNOSIS — M545 Low back pain, unspecified: Secondary | ICD-10-CM

## 2013-08-20 NOTE — Progress Notes (Signed)
Physical Therapy Treatment Patient Details  Name: [name] MRN: [id number] Date of Birth: 1940-07-30  Today's Date: 08/20/2013 Time: 1100-1145 PT Time Calculation (min): 45 min Charges 1100-1145 TE    FOTO performed this visit Visit#: 1 of 12  Re-eval: 09/17/13  Assessment Diagnosis: low back pain  Surgical Date: 05/23/13 Next MD Visit: dr. Moshe Cipro  Prior Therapy: no  Authorization: [employer]   Authorization Time Period:    Authorization Visit#: 2 of 10   Subjective: Symptoms/Limitations Symptoms: "so far so good, did not work today as Program[employer], applications" deneis pain   Precautions/Restrictions : hernia     Exercise/Treatment       Stretches Lower Trunk Rotation:  (resistive green band 10 ress each side, 10 reps no band warm up  ) Aerobic Stationary Bike: Nu step 8 min    Standing Scapular Retraction: 20 reps;Limitations - posture cues  Scapular Retraction Limitations: green with TA activation  Shoulder Extension: Strengthening;20 reps;Theraband- posture cues  Seated Other Seated Lumbar Exercises: seated march with core activations 20x  Other Seated Lumbar Exercises: seated on ball 1 min no UE support, seated bounce on ball 1 min, seated forward steps 10x, seated flexion stretch on ball 3 x  Supine Clam: 20 reps;Limitations Clam Limitations: green t band  Bridge: 15 reps        Physical Therapy Assessment and Plan PT Assessment and Plan Clinical Impression Statement: progressed pain free today to transverse abdominlal exercisews without symptoms, needs further core and gluteal strengthening  Pt will benefit from skilled therapeutic intervention in order to improve on the following deficits: Decreased range of motion;Decreased strength;Pain Rehab Potential: Good PT Frequency: Min 2X/week PT Duration: 6 weeks PT Treatment/Interventions: Therapeutic activities;Therapeutic exercise;Manual techniques;Neuromuscular re-education;Balance  training;Modalities;Patient/family education PT Plan: core stabilization, hip strengthening , seated exercises on ball     Goals Home Exercise Program Pt/caregiver will Perform Home Exercise Program: For increased strengthening PT Goal: Perform Home Exercise Program - Progress: Progressing toward goal PT Short Term Goals Time to Complete Short Term Goals: 3 weeks PT Short Term Goal 1: stand for 5 min comfortably in place max pain 2/10 for decreased pain standing at church and store  PT Short Term Goal 1 - Progress: Progressing toward goal PT Short Term Goal 2: patient reporting walking 10 min without increased symtpoms  PT Short Term Goal 2 - Progress: Progressing toward goal PT Long Term Goals Time to Complete Long Term Goals:  (6 weeks ) PT Long Term Goal 1: demonstrate proper body mechancis supine to sit to reduce strain on low back within 6 weeks  PT Long Term Goal 1 - Progress: Progressing toward goal PT Long Term Goal 2: demonstrate proper body mechnaics during household ADLS such as vacumming and laundry to reduce strain on low back within 6 weeks  PT Long Term Goal 2 - Progress: Progressing toward goal Long Term Goal 3: pain free lumbar spine during dressing ADLS and basic household chores  Long Term Goal 3 Progress: Progressing toward goal  Problem List Patient Active Problem List   Diagnosis Date Noted  . Low back pain 08/18/2013  . Metabolic syndrome X 27/01/8674  . OA (osteoarthritis) of knee 05/20/2013  . Abdominal pain, epigastric 02/12/2013  . Unspecified constipation 02/12/2013  . GERD (gastroesophageal reflux disease) 02/12/2013  . Other dysphagia 02/12/2013  . Routine general medical examination at a health care facility 10/19/2012  . COPD (chronic obstructive pulmonary disease) 08/25/2011  . Onychomycosis of toenail 06/25/2011  .  Allergic rhinitis 11/23/2010  . Vitamin D deficiency 11/23/2010  . KNEE PAIN 05/30/2010  . ANSERINE BURSITIS, LEFT 05/30/2010  .  DISORDER OF BONE AND CARTILAGE UNSPECIFIED 02/15/2010  . BACK PAIN WITH RADICULOPATHY 08/22/2009  . FATIGUE 06/21/2008  . Prediabetes 05/31/2008  . UNSPECIFIED VISUAL LOSS 04/19/2008  . Other and unspecified hyperlipidemia 03/02/2008  . OBESITY 10/30/2007  . NICOTINE ADDICTION 10/30/2007  . HYPERTENSION 10/30/2007  . Osteoarth NOS-L/Leg 10/30/2007    General Behavior During Therapy: [facility] for tasks assessed/performed PT Plan of Care PT Home Exercise Plan: written provided, needs progressed and reviewed  Consulted and Agree with Plan of Care: Patient   Khyli Swaim 08/20/2013, 11:58 AM

## 2013-08-20 NOTE — Progress Notes (Signed)
cc'd to pcp 

## 2013-08-25 ENCOUNTER — Ambulatory Visit (HOSPITAL_COMMUNITY)
Admission: RE | Admit: 2013-08-25 | Discharge: 2013-08-25 | Disposition: A | Discharge status: Home / Self Care | Payer: Medicare Other | Source: Ambulatory Visit | Attending: Family Medicine | Admitting: Family Medicine

## 2013-08-25 ENCOUNTER — Other Ambulatory Visit: Payer: Self-pay | Admitting: Gastroenterology

## 2013-08-25 DIAGNOSIS — I10 Essential (primary) hypertension: Secondary | ICD-10-CM | POA: Insufficient documentation

## 2013-08-25 DIAGNOSIS — IMO0001 Reserved for inherently not codable concepts without codable children: Secondary | ICD-10-CM | POA: Insufficient documentation

## 2013-08-25 DIAGNOSIS — R1319 Other dysphagia: Secondary | ICD-10-CM

## 2013-08-25 DIAGNOSIS — J4489 Other specified chronic obstructive pulmonary disease: Secondary | ICD-10-CM | POA: Insufficient documentation

## 2013-08-25 DIAGNOSIS — M545 Low back pain, unspecified: Secondary | ICD-10-CM | POA: Insufficient documentation

## 2013-08-25 DIAGNOSIS — J449 Chronic obstructive pulmonary disease, unspecified: Secondary | ICD-10-CM | POA: Insufficient documentation

## 2013-08-25 NOTE — Progress Notes (Signed)
Quick Note:  BPE: She has mild age-related dysmotility.  There is a possible nodularity in mid to distal esophagus. Unclear if this is an actual lesion or just artifact on imaging.  Her last EGD was in Aug 2014.  I don't think we can ignore this; unable to exclude something "new" since last EGD although less likely.  Let's set her up for an EGD with Dr. Oneida Alar. ______

## 2013-08-25 NOTE — Progress Notes (Signed)
Quick Note:  I called and informed pt. Ok to schedule EGD. ______

## 2013-08-25 NOTE — Progress Notes (Signed)
Physical Therapy Treatment Patient Details  Name: [name] MRN: [id number] Date of Birth: [birth year]  Today's Date: 08/25/2013 Time: 1300-1345 PT Time Calculation (min): 65 min 3500-9381 TE     Visit#: 3 of 12  Re-eval: 09/17/13 Assessment Diagnosis: low back pain  Surgical Date: 05/23/13 Next MD Visit: dr. Moshe Cipro  Prior Therapy: no  Authorization: medicare [employer]   Authorization Time Period:    Authorization Visit#: 3 of 10   Subjective: Symptoms/Limitations Symptoms: sinus infection , back sore , right thigh sore , will postpone joining [employer] per concerns of it effecting her sinuses   Precautions/Restrictions     Exercise/Treatments Mobility/Balance        Stretches Lower Trunk Rotation: Limitations Lower Trunk Rotation Limitations: 10 x each directions, then 10x against t band resistance green  Aerobic Stationary Bike: Nu step 8 min  L2 , using arms and good posture  Machines for Strengthening   Standing Scapular Retraction: 20 reps;Limitations Scapular Retraction Limitations: green with TA activation  Shoulder Extension: Strengthening;20 reps;Theraband Theraband Level (Shoulder Extension): Level 3 (Green) Seated Other Seated Lumbar Exercises: seated march with core activations 20x  Other Seated Lumbar Exercises: seated on ball 1 min no UE support, seated bounce on ball 1 min, seated forward steps 10x, seated flexion stretch on ball 3 x  Supine Clam: 20 reps;Limitations Clam Limitations: green t band  Bent Knee Raise: 20 reps Bridge: 15 reps Straight Leg Raise: 10 reps  B LE  Other Supine Lumbar Exercises: supine ball  hip flexion /hs curl with arms 15 x  Holding ball  Other Supine Lumbar Exercises: inner thihg ball squeze 15x              Physical Therapy Assessment and Plan PT Assessment and Plan Clinical Impression Statement: no complaints of pain during ther ex today  PT Frequency: Min 2X/week PT Duration: 8 weeks PT Plan: core  stabilization, hip strengthening , seated exercises on ball     Goals Home Exercise Program PT Goal: Perform Home Exercise Program - Progress: Progressing toward goal PT Short Term Goals Time to Complete Short Term Goals: 3 weeks PT Short Term Goal 1: stand for 5 min comfortably in place max pain 2/10 for decreased pain standing at church and store  PT Short Term Goal 1 - Progress: Progressing toward goal PT Short Term Goal 2: patient reporting walking 10 min without increased symtpoms  PT Short Term Goal 2 - Progress: Progressing toward goal PT Long Term Goals PT Long Term Goal 1: demonstrate proper body mechancis supine to sit to reduce strain on low back within 6 weeks  PT Long Term Goal 1 - Progress: Progressing toward goal PT Long Term Goal 2: demonstrate proper body mechnaics during household ADLS such as vacumming and laundry to reduce strain on low back within 6 weeks  PT Long Term Goal 2 - Progress: Progressing toward goal Long Term Goal 3: pain free lumbar spine during dressing ADLS and basic household chores  Long Term Goal 3 Progress: Progressing toward goal  Problem List Patient Active Problem List   Diagnosis Date Noted  . Low back pain 08/18/2013  . Metabolic syndrome X 82/99/3716  . OA (osteoarthritis) of knee 05/20/2013  . Abdominal pain, epigastric 02/12/2013  . Unspecified constipation 02/12/2013  . GERD (gastroesophageal reflux disease) 02/12/2013  . Other dysphagia 02/12/2013  . Routine general medical examination at a health care facility 10/19/2012  . COPD (chronic obstructive pulmonary disease) 08/25/2011  . Onychomycosis of  toenail 06/25/2011  . Allergic rhinitis 11/23/2010  . Vitamin D deficiency 11/23/2010  . KNEE PAIN 05/30/2010  . ANSERINE BURSITIS, LEFT 05/30/2010  . DISORDER OF BONE AND CARTILAGE UNSPECIFIED 02/15/2010  . BACK PAIN WITH RADICULOPATHY 08/22/2009  . FATIGUE 06/21/2008  . Prediabetes 05/31/2008  . UNSPECIFIED VISUAL LOSS 04/19/2008   . Other and unspecified hyperlipidemia 03/02/2008  . OBESITY 10/30/2007  . NICOTINE ADDICTION 10/30/2007  . HYPERTENSION 10/30/2007  . Osteoarth NOS-L/Leg 10/30/2007    General Behavior During Therapy: [facility] for tasks assessed/performed  GP    Braxley Balandran 08/25/2013, 2:26 PM

## 2013-08-28 ENCOUNTER — Ambulatory Visit (HOSPITAL_COMMUNITY): Payer: Medicare Other

## 2013-08-28 ENCOUNTER — Encounter (HOSPITAL_COMMUNITY): Payer: Self-pay | Admitting: Pharmacy Technician

## 2013-09-01 ENCOUNTER — Ambulatory Visit (HOSPITAL_COMMUNITY)
Admission: RE | Admit: 2013-09-01 | Discharge: 2013-09-01 | Disposition: A | Discharge status: Home / Self Care | Payer: Medicare Other | Source: Ambulatory Visit | Attending: Family Medicine | Admitting: Family Medicine

## 2013-09-01 DIAGNOSIS — M545 Low back pain, unspecified: Secondary | ICD-10-CM

## 2013-09-01 NOTE — Progress Notes (Addendum)
Physical Therapy Treatment Patient Details  Name: [name] MRN: [id number] Date of Birth: 1941-03-14 Charges 42- 41 TE  Today's Date: 09/01/2013 Time: 1345-1430 PT Time Calculation (min): 45 min  Visit#: 4 of 12  Re-eval: 09/17/13 Assessment Diagnosis: low back pain  Surgical Date: 05/23/13 Next MD Visit: dr. Moshe Cipro  Prior Therapy: no  Authorization: medicare united   Authorization Time Period:    Authorization Visit#: 4 of 10   Subjective: Symptoms/Limitations Symptoms: back sore left side, sore on right side yesterday Pertinent History: 73 yr old female , right TKR , recent dx of high hiatal hernia   Pain Assessment Currently in Pain?: No/denies  Precautions/Restrictions     Exercise/Treatments Mobility/Balance        Stretches Lower Trunk Rotation: Limitations Lower Trunk Rotation Limitations: 10 x each directions, then 10x against t band resistance  Aerobic Stationary Bike: Nu step 8 min  L2  Machines for Strengthening   Standing   Seated   Supine Clam: 20 reps;Limitations Clam Limitations: green t band  Bent Knee Raise: 20 reps Bridge: 15 reps Straight Leg Raise: 10 reps Other Supine Lumbar Exercises: supine ball  hip flexion /hs curl with arms 15 x   Other Supine Lumbar Exercises: iner thigh  ball squeeze 15x , up up down  down core ab bracing 7X  Sidelying Hip Abduction: 10 reps bilateral   Tactile cues  Prone    Quadruped       Physical Therapy Assessment and Plan PT Assessment and Plan Clinical Impression Statement: decreased stane time on left LE in gait per knee DJD, foxused session on core stability and HEP progression , tactile and verbal cues for form on s/l hip abduction  PT Plan: core stabilization, hip strengthening , seated exercises on ball     Goals Home Exercise Program Pt/caregiver will Perform Home Exercise Program: For increased strengthening PT Goal: Perform Home Exercise Program - Progress: Progressing toward  goal PT Short Term Goals Time to Complete Short Term Goals: 3 weeks PT Short Term Goal 1: stand for 5 min comfortably in place max pain 2/10 for decreased pain standing at church and store  PT Short Term Goal 1 - Progress: Met PT Short Term Goal 2: patient reporting walking 10 min without increased symtpoms  PT Short Term Goal 2 - Progress: Progressing toward goal PT Long Term Goals PT Long Term Goal 1: demonstrate proper body mechancis supine to sit to reduce strain on low back within 6 weeks  PT Long Term Goal 1 - Progress: Progressing toward goal PT Long Term Goal 2: demonstrate proper body mechnaics during household ADLS such as vacumming and laundry to reduce strain on low back within 6 weeks  PT Long Term Goal 2 - Progress: Progressing toward goal Long Term Goal 3: pain free lumbar spine during dressing ADLS and basic household chores  Long Term Goal 3 Progress: Progressing toward goal  Problem List Patient Active Problem List   Diagnosis Date Noted  . Low back pain 08/18/2013  . Metabolic syndrome X 14/48/1856  . OA (osteoarthritis) of knee 05/20/2013  . Abdominal pain, epigastric 02/12/2013  . Unspecified constipation 02/12/2013  . GERD (gastroesophageal reflux disease) 02/12/2013  . Other dysphagia 02/12/2013  . Routine general medical examination at a health care facility 10/19/2012  . COPD (chronic obstructive pulmonary disease) 08/25/2011  . Onychomycosis of toenail 06/25/2011  . Allergic rhinitis 11/23/2010  . Vitamin D deficiency 11/23/2010  . KNEE PAIN 05/30/2010  . ANSERINE BURSITIS,  LEFT 05/30/2010  . DISORDER OF BONE AND CARTILAGE UNSPECIFIED 02/15/2010  . BACK PAIN WITH RADICULOPATHY 08/22/2009  . FATIGUE 06/21/2008  . Prediabetes 05/31/2008  . UNSPECIFIED VISUAL LOSS 04/19/2008  . Other and unspecified hyperlipidemia 03/02/2008  . OBESITY 10/30/2007  . NICOTINE ADDICTION 10/30/2007  . HYPERTENSION 10/30/2007  . Osteoarth NOS-L/Leg 10/30/2007     General Behavior During Therapy: [facility] for tasks assessed/performed PT Plan of Care PT Home Exercise Plan: written HEP provided, will review next visit   GP    Jacobey Gura 09/01/2013, 3:43 PM

## 2013-09-04 ENCOUNTER — Ambulatory Visit (HOSPITAL_COMMUNITY)
Admission: RE | Admit: 2013-09-04 | Discharge: 2013-09-04 | Disposition: A | Discharge status: Home / Self Care | Payer: Medicare Other | Source: Ambulatory Visit | Attending: Family Medicine | Admitting: Family Medicine

## 2013-09-04 DIAGNOSIS — M545 Low back pain, unspecified: Secondary | ICD-10-CM

## 2013-09-04 NOTE — Progress Notes (Signed)
Physical Therapy Treatment Patient Details  Name: [name] MRN: [id number] Date of Birth: [birth year]  Today's Date: 09/04/2013 Time: 4098-1191 PT Time Calculation (min): 43 min Charge TE 4782-9562  Visit#: 5 of 12  Re-eval:   Assessment Diagnosis: low back pain  Surgical Date: 05/23/13 Next MD Visit: dr. Moshe Cipro  Prior Therapy: no  Authorization: medicare united   Authorization Time Period:    Authorization Visit#: 5 of 10   Subjective: Symptoms/Limitations Symptoms: Pain free until pt moves, increased LBP with movements.  Lt knee sore.  Pt without pain medication currently.   Pain Assessment Currently in Pain?: No/denies  Objective:   Exercise/Treatments Aerobic Stationary Bike: Nu step 10 min hill level 3, resistance 3 Seated Other Seated Lumbar Exercises: seated on therapeutic ball TrA activation sitting tall head to therapist hand 3x 30";LAQ 10x 5" with HHA, adduction 15x, abduction 15x Other Seated Lumbar Exercises:   Supine Bridge: 15 reps;5 seconds Large Ball Abdominal Isometric: 5 reps;5 seconds Large Ball Oblique Isometric: 5 reps;5 seconds    Physical Therapy Assessment and Plan PT Assessment and Plan Clinical Impression Statement: Added seated and supine therapeutic ball exercises for posture awareness and core activation.  Pt able to perform with tactile and verbal cueing for form and stabilty.  Pt reported feeling better at end of session. PT Plan: core stabilization, hip strengthening , seated exercises on ball     Goals    Problem List Patient Active Problem List   Diagnosis Date Noted  . Low back pain 08/18/2013  . Metabolic syndrome X 13/02/6577  . OA (osteoarthritis) of knee 05/20/2013  . Abdominal pain, epigastric 02/12/2013  . Unspecified constipation 02/12/2013  . GERD (gastroesophageal reflux disease) 02/12/2013  . Other dysphagia 02/12/2013  . Routine general medical examination at a health care facility 10/19/2012  . COPD  (chronic obstructive pulmonary disease) 08/25/2011  . Onychomycosis of toenail 06/25/2011  . Allergic rhinitis 11/23/2010  . Vitamin D deficiency 11/23/2010  . KNEE PAIN 05/30/2010  . ANSERINE BURSITIS, LEFT 05/30/2010  . DISORDER OF BONE AND CARTILAGE UNSPECIFIED 02/15/2010  . BACK PAIN WITH RADICULOPATHY 08/22/2009  . FATIGUE 06/21/2008  . Prediabetes 05/31/2008  . UNSPECIFIED VISUAL LOSS 04/19/2008  . Other and unspecified hyperlipidemia 03/02/2008  . OBESITY 10/30/2007  . NICOTINE ADDICTION 10/30/2007  . HYPERTENSION 10/30/2007  . Osteoarth NOS-L/Leg 10/30/2007    PT - End of Session Activity Tolerance: Patient tolerated treatment well General Behavior During Therapy: [facility] for tasks assessed/performed  GP    Aldona Lento 09/04/2013, 3:11 PM

## 2013-09-08 ENCOUNTER — Ambulatory Visit (HOSPITAL_COMMUNITY): Payer: Medicare Other | Admitting: Physical Therapy

## 2013-09-10 ENCOUNTER — Ambulatory Visit (HOSPITAL_COMMUNITY): Payer: Medicare Other | Admitting: Physical Therapy

## 2013-09-11 ENCOUNTER — Ambulatory Visit (HOSPITAL_COMMUNITY)
Admission: RE | Admit: 2013-09-11 | Discharge: 2013-09-11 | Disposition: A | Discharge status: Home / Self Care | Payer: Medicare Other | Source: Ambulatory Visit | Attending: Gastroenterology | Admitting: Gastroenterology

## 2013-09-11 ENCOUNTER — Encounter (HOSPITAL_COMMUNITY)
Admission: RE | Disposition: A | Discharge status: Home / Self Care | Payer: Self-pay | Source: Ambulatory Visit | Attending: Gastroenterology

## 2013-09-11 ENCOUNTER — Encounter (HOSPITAL_COMMUNITY): Payer: Self-pay | Admitting: *Deleted

## 2013-09-11 DIAGNOSIS — Q393 Congenital stenosis and stricture of esophagus: Secondary | ICD-10-CM

## 2013-09-11 DIAGNOSIS — J4489 Other specified chronic obstructive pulmonary disease: Secondary | ICD-10-CM | POA: Insufficient documentation

## 2013-09-11 DIAGNOSIS — K264 Chronic or unspecified duodenal ulcer with hemorrhage: Secondary | ICD-10-CM

## 2013-09-11 DIAGNOSIS — Q391 Atresia of esophagus with tracheo-esophageal fistula: Secondary | ICD-10-CM | POA: Insufficient documentation

## 2013-09-11 DIAGNOSIS — K2960 Other gastritis without bleeding: Secondary | ICD-10-CM | POA: Insufficient documentation

## 2013-09-11 DIAGNOSIS — R1310 Dysphagia, unspecified: Secondary | ICD-10-CM | POA: Insufficient documentation

## 2013-09-11 DIAGNOSIS — K219 Gastro-esophageal reflux disease without esophagitis: Secondary | ICD-10-CM

## 2013-09-11 DIAGNOSIS — J449 Chronic obstructive pulmonary disease, unspecified: Secondary | ICD-10-CM | POA: Insufficient documentation

## 2013-09-11 DIAGNOSIS — I10 Essential (primary) hypertension: Secondary | ICD-10-CM | POA: Insufficient documentation

## 2013-09-11 DIAGNOSIS — E669 Obesity, unspecified: Secondary | ICD-10-CM | POA: Insufficient documentation

## 2013-09-11 DIAGNOSIS — K259 Gastric ulcer, unspecified as acute or chronic, without hemorrhage or perforation: Secondary | ICD-10-CM | POA: Insufficient documentation

## 2013-09-11 DIAGNOSIS — Z7982 Long term (current) use of aspirin: Secondary | ICD-10-CM | POA: Insufficient documentation

## 2013-09-11 DIAGNOSIS — R1319 Other dysphagia: Secondary | ICD-10-CM

## 2013-09-11 DIAGNOSIS — K449 Diaphragmatic hernia without obstruction or gangrene: Secondary | ICD-10-CM | POA: Insufficient documentation

## 2013-09-11 HISTORY — PX: ESOPHAGOGASTRODUODENOSCOPY: SHX5428

## 2013-09-11 SURGERY — EGD (ESOPHAGOGASTRODUODENOSCOPY)
Anesthesia: Moderate Sedation

## 2013-09-11 MED ORDER — STERILE WATER FOR IRRIGATION IR SOLN
Status: DC | PRN
  Administered 2013-09-11: 11:00:00

## 2013-09-11 MED ORDER — MEPERIDINE HCL 100 MG/ML IJ SOLN
INTRAMUSCULAR | Status: DC | PRN
  Administered 2013-09-11 (×2): 25 mg

## 2013-09-11 MED ORDER — MIDAZOLAM HCL 5 MG/5ML IJ SOLN
INTRAMUSCULAR | Status: DC | PRN
  Administered 2013-09-11: 2 mg via INTRAVENOUS
  Administered 2013-09-11: 1 mg via INTRAVENOUS

## 2013-09-11 MED ORDER — SODIUM CHLORIDE 0.9 % IV SOLN
INTRAVENOUS | Status: DC
  Administered 2013-09-11: 1000 mL via INTRAVENOUS

## 2013-09-11 MED ORDER — MEPERIDINE HCL 100 MG/ML IJ SOLN
INTRAMUSCULAR | Status: AC
  Filled 2013-09-11: qty 2

## 2013-09-11 MED ORDER — LIDOCAINE VISCOUS 2 % MT SOLN
OROMUCOSAL | Status: AC
  Filled 2013-09-11: qty 15

## 2013-09-11 MED ORDER — MIDAZOLAM HCL 5 MG/5ML IJ SOLN
INTRAMUSCULAR | Status: AC
  Filled 2013-09-11: qty 10

## 2013-09-11 MED ORDER — PANTOPRAZOLE SODIUM 40 MG PO TBEC: DELAYED_RELEASE_TABLET | ORAL | Status: DC

## 2013-09-11 NOTE — Discharge Instructions (Signed)
I dilated your esophagus. You have a stricture near the middle of your esophagus. You have a  hiatal hernia. You have ulcers. I biopsied your stomach.   STRICTLY AVOID ASPIRIN, BC/GOODY POWDERS, RELAFEN, IBUPROFEN/MOTRIN, OR NAPROXEN/ALEVE BECAUSE YOU HAVE ULCERS.  INCREASE PROTONIX. TAKE 30 MINUTES PRIOR TO YOUR MEALS TWICE DAILY.  FOLLOW A LOW FAT DIET. SEE INFO BELOW.  YOUR BIOPSY WILL BE BACK IN 7 DAYS.  FOLLOW UP IN 4 MOS.   UPPER ENDOSCOPY AFTER CARE Read the instructions outlined below and refer to this sheet in the next week. These discharge instructions provide you with general information on caring for yourself after you leave the hospital. While your treatment has been planned according to the most current medical practices available, unavoidable complications occasionally occur. If you have any problems or questions after discharge, call DR. FIELDS, [phone].  ACTIVITY  You may resume your regular activity, but move at a slower pace for the next 24 hours.   Take frequent rest periods for the next 24 hours.   Walking will help get rid of the air and reduce the bloated feeling in your belly (abdomen).   No driving for 24 hours (because of the medicine (anesthesia) used during the test).   You may shower.   Do not sign any important legal documents or operate any machinery for 24 hours (because of the anesthesia used during the test).    NUTRITION  Drink plenty of fluids.   You may resume your normal diet as instructed by your doctor.   Begin with a light meal and progress to your normal diet. Heavy or fried foods are harder to digest and may make you feel sick to your stomach (nauseated).   Avoid alcoholic beverages for 24 hours or as instructed.    MEDICATIONS  You may resume your normal medications.   WHAT YOU CAN EXPECT TODAY  Some feelings of bloating in the abdomen.   Passage of more gas than usual.    IF YOU HAD A BIOPSY TAKEN DURING THE  UPPER ENDOSCOPY:  Eat a soft diet IF YOU HAVE NAUSEA, BLOATING, ABDOMINAL PAIN, OR VOMITING.    FINDING OUT THE RESULTS OF YOUR TEST Not all test results are available during your visit. DR. Oneida Alar WILL CALL YOU WITHIN 7 DAYS OF YOUR PROCEDUE WITH YOUR RESULTS. Do not assume everything is normal if you have not heard from DR. FIELDS IN ONE WEEK, CALL HER OFFICE AT [phone].  SEEK IMMEDIATE MEDICAL ATTENTION AND CALL THE OFFICE: [phone] IF:  You have more than a spotting of blood in your stool.   Your belly is swollen (abdominal distention).   You are nauseated or vomiting.   You have a temperature over 101F.   You have abdominal pain or discomfort that is severe or gets worse throughout the day.    Low-Fat Diet BREADS, CEREALS, PASTA, RICE, DRIED PEAS, AND BEANS These products are high in carbohydrates and most are low in fat. Therefore, they can be increased in the diet as substitutes for fatty foods. They too, however, contain calories and should not be eaten in excess. Cereals can be eaten for snacks as well as for breakfast.  Include foods that contain fiber (fruits, vegetables, whole grains, and legumes). Research shows that fiber may lower blood cholesterol levels, especially the water-soluble fiber found in fruits, vegetables, oat products, and legumes. FRUITS AND VEGETABLES It is good to eat fruits and vegetables. Besides being sources of fiber, both are rich in vitamins  and some minerals. They help you get the daily allowances of these nutrients. Fruits and vegetables can be used for snacks and desserts. MEATS Limit lean meat, chicken, [place], and fish to no more than 6 ounces per day. Beef, Pork, and Lamb Use lean cuts of beef, pork, and lamb. Lean cuts include:  Extra-lean ground beef.  Arm roast.  Sirloin tip.  Center-cut ham.  Round steak.  Loin chops.  Rump roast.  Tenderloin.  Trim all fat off the outside of meats before cooking. It is not necessary to  severely decrease the intake of red meat, but lean choices should be made. Lean meat is rich in protein and contains a highly absorbable form of iron. Premenopausal women, in particular, should avoid reducing lean red meat because this could increase the risk for low red blood cells (iron-deficiency anemia).  Chicken and [place] These are good sources of protein. The fat of poultry can be reduced by removing the skin and underlying fat layers before cooking. Chicken and [place] can be substituted for lean red meat in the diet. Poultry should not be fried or covered with high-fat sauces. Fish and Shellfish Fish is a good source of protein. Shellfish contain cholesterol, but they usually are low in saturated fatty acids. The preparation of fish is important. Like chicken and [place], they should not be fried or covered with high-fat sauces. EGGS Egg whites contain no fat or cholesterol. They can be eaten often. Try 1 to 2 egg whites instead of whole eggs in recipes or use egg substitutes that do not contain yolk.  MILK AND DAIRY PRODUCTS Use skim or 1% milk instead of 2% or whole milk. Decrease whole milk, natural, and processed cheeses. Use nonfat or low-fat (2%) cottage cheese or low-fat cheeses made from vegetable oils. Choose nonfat or low-fat (1 to 2%) yogurt. Experiment with evaporated skim milk in recipes that call for heavy cream. Substitute low-fat yogurt or low-fat cottage cheese for sour cream in dips and salad dressings. Have at least 2 servings of low-fat dairy products, such as 2 glasses of skim (or 1%) milk each day to help get your daily calcium intake.  FATS AND OILS Butterfat, lard, and beef fats are high in saturated fat and cholesterol. These should be avoided.Vegetable fats do not contain cholesterol. AVOID coconut oil, palm oil, and palm kernel oil, WHICH are very high in saturated fats. These should be limited. These fats are often used in bakery goods, processed foods, popcorn, oils,  and nondairy creamers. Vegetable shortenings and some peanut butters contain hydrogenated oils, which are also saturated fats. Read the labels on these foods and check for saturated vegetable oils.  Desirable liquid vegetable oils are corn oil, cottonseed oil, olive oil, canola oil, safflower oil, soybean oil, and sunflower oil. Peanut oil is not as good, but small amounts are acceptable. Buy a heart-healthy tub margarine that has no partially hydrogenated oils in the ingredients. AVOID Mayonnaise and salad dressings often are made from unsaturated fats.  OTHER EATING TIPS Snacks  Most sweets should be limited as snacks. They tend to be rich in calories and fats, and their caloric content outweighs their nutritional value. Some good choices in snacks are graham crackers, melba toast, soda crackers, bagels (no egg), English muffins, fruits, and vegetables. These snacks are preferable to snack crackers, [place] fries, and chips. Popcorn should be air-popped or cooked in small amounts of liquid vegetable oil.  Desserts Eat fruit, low-fat yogurt, and fruit ices instead of pastries,  cake, and cookies. Sherbet, angel food cake, gelatin dessert, frozen low-fat yogurt, or other frozen products that do not contain saturated fat (pure fruit juice bars, frozen ice pops) are also acceptable.   COOKING METHODS Choose those methods that use little or no fat. They include: Poaching.  Braising.  Steaming.  Grilling.  Baking.  Stir-frying.  Broiling.  Microwaving.  Foods can be cooked in a nonstick pan without added fat, or use a nonfat cooking spray in regular cookware. Limit fried foods and avoid frying in saturated fat. Add moisture to lean meats by using water, broth, cooking wines, and other nonfat or low-fat sauces along with the cooking methods mentioned above. Soups and stews should be chilled after cooking. The fat that forms on top after a few hours in the refrigerator should be skimmed off. When  preparing meals, avoid using excess salt. Salt can contribute to raising blood pressure in some people.  EATING AWAY FROM HOME Order entres, potatoes, and vegetables without sauces or butter. When meat exceeds the size of a deck of cards (3 to 4 ounces), the rest can be taken home for another meal. Choose vegetable or fruit salads and ask for low-calorie salad dressings to be served on the side. Use dressings sparingly. Limit high-fat toppings, such as bacon, crumbled eggs, cheese, sunflower seeds, and olives. Ask for heart-healthy tub margarine instead of butter.   Ulcer Disease (Peptic Ulcer, Gastric Ulcer, Duodenal Ulcer) You have an ulcer. This may be in your stomach (gastric ulcer) or in the first part of your small bowel, the duodenum (duodenal ulcer). An ulcer is a break in the lining of the stomach or duodenum. The ulcer causes erosion into the deeper tissue.  CAUSES The stomach has a lining to protect itself from the acid that digests food. The lining can be damaged in two main ways:  The Helico Pylori bacteria (H. Pyolori) can infect the lining of the stomach and cause ulcers.   Nonsteroidal, anti-inflammatory medications (NSAIDS) can cause gastric ulcerations.   Smoking tobacco can increase the acid in the stomach. This can lead to ulcers, and will impair healing of ulcers.  Other factors, such as alcohol use and stress may contribute to ulcer formation. Rarely, a tumor or cancer can cause an ulcer.   SYMPTOMS The problems (symptoms) of ulcer disease are usually a burning or gnawing of the mid-upper belly (abdomen). This is often worse on an empty stomach and may get better with food. This may be associated with feeling sick to your stomach (nausea), bloating, and vomiting. If the ulcer results in bleeding, it can cause:  Black, tarry stools.   Vomiting of bright red blood.   Vomiting coffee-ground-looking materials.   SEVERE ANEMIA  HOME CARE INSTRUCTIONS Continue  regular work and usual activities unless advised otherwise by your caregiver.  Avoid tobacco, alcohol, and caffeine. Tobacco use will decrease and slow the rates of healing.   Avoid foods that seem to aggravate or cause discomfort.   There are many over-the-counter products available to control stomach acid and other symptoms.   Hiatal Hernia A hiatal hernia occurs when a part of the stomach slides above the diaphragm. The diaphragm is the thin muscle separating the belly (abdomen) from the chest. A hiatal hernia can be something you are born with or develop over time. Hiatal hernias may allow stomach acid to flow back into your esophagus, the tube which carries food from your mouth to your stomach. If this acid causes problems it  is called GERD (gastro-esophageal reflux disease).   SYMPTOMS Common symptoms of GERD are heartburn (burning in your chest). This is worse when lying down or bending over. It may also cause belching and indigestion. Some of the things which make GERD worse are:  Increased weight pushes on stomach making acid rise more easily.   Smoking markedly increases acid production.   Alcohol decreases lower esophageal sphincter pressure (valve between stomach and esophagus), allowing acid from stomach into esophagus.   Late evening meals and going to bed with a full stomach increases pressure.   Anything that causes an increase in acid production.    HOME CARE INSTRUCTIONS  Try to achieve and maintain an ideal body weight.   Avoid drinking alcoholic beverages.   DO NOT smokE.   Do not wear tight clothing around your chest or stomach.   Eat smaller meals and eat more frequently. This keeps your stomach from getting too full. Eat slowly.   Do not lie down for 2 or 3 hours after eating. Do not eat or drink anything 1 to 2 hours before going to bed.   Avoid caffeine beverages (colas, coffee, cocoa, tea), fatty foods, citrus fruits and all other foods and drinks that  contain acid and that seem to increase the problems.   Avoid bending over, especially after eating OR STRAINING. Anything that increases the pressure in your belly increases the amount of acid that may be pushed up into your esophagus.    ESOPHAGEAL STRICTURE  Esophageal strictures can be caused by stomach acid backing up into the tube that carries food from the mouth down to the stomach (lower esophagus).  TREATMENT There are a number of medicines used to treat reflux/stricture, including: Antacids.  Proton-pump inhibitors: PROTONIX   HOME CARE INSTRUCTIONS Eat 2-3 hours before going to bed.  Try to reach and maintain a healthy weight.  Do not eat just a few very large meals. Instead, eat 4 TO 6 smaller meals throughout the day.  Try to identify foods and beverages that make your symptoms worse, and avoid these.  Avoid tight clothing.  Do not exercise right after eating.

## 2013-09-11 NOTE — H&P (Signed)
Primary Care Physician:  [name], MD Primary Gastroenterologist:  Dr. Oneida Alar  Pre-Procedure History & Physical: HPI:  [name] is a 73 y.o. female here for Abnormal ESOPHAGRAM.  Past Medical History  Diagnosis Date  . Sinusitis   . OA (osteoarthritis) of knee   . Nicotine addiction   . COPD (chronic obstructive pulmonary disease)   . Obesity   . Hypertension   . Lung nodule seen on imaging study     Past Surgical History  Procedure Laterality Date  . Knee arthroscopy right    . Cholecystectomy    . Total knee arthroplasty right  05/29/2005    Dr. Aline Brochure   . Bilateral cataract surgery  7/27 & 03/01/09    Dr. Gershon Crane    . Partial hysterectomy    . Esophagogastroduodenoscopy  11/07/2010    Jenkins:hiatal hernia/no evidence of Barrett esophagitis.  The Z-line was noted to be at 39 cm from the teeth. CLO test negative  . Colonoscopy  04/2004    Dr. Leane Para Smith-->melanosis coli  . Colonoscopy with esophagogastroduodenoscopy (egd) N/A 02/25/2013    Dr. Fields:Normal mucosa in the terminal ileum/Severe melanosis throughout the entire examined colon/ONE COLON POLYP REMOVED (tubular adenoma)/Mild diverticulosis  in the sigmoid colon/EGD:The mucosa of the esophagus appeared normal/Non-erosive gastritis, empiric dilation with Savary dilator  . Savory dilation N/A 02/25/2013    Procedure: SAVORY DILATION;  Surgeon: Danie Binder, MD;  Location: [facility];  Service: Endoscopy;  Laterality: N/A;  Venia Minks dilation N/A 02/25/2013    Procedure: Venia Minks DILATION;  Surgeon: Danie Binder, MD;  Location: [facility];  Service: Endoscopy;  Laterality: N/A;    Prior to Admission medications   Medication Sig Start Date End Date Taking? Authorizing Provider  ADVAIR DISKUS 100-50 MCG/DOSE AEPB  02/20/13  Yes Historical Provider, MD  aspirin EC 81 MG tablet Take 81 mg by mouth daily.   Yes Historical Provider, MD  calcium-vitamin D (OSCAL WITH D) 500-200 MG-UNIT per tablet Take 1  tablet by mouth 2 (two) times daily. 05/20/12  Yes Fayrene Helper, MD  fluticasone (FLONASE) 50 MCG/ACT nasal spray Place 2 sprays into both nostrils daily. 08/06/13 08/06/14 Yes Fayrene Helper, MD  hydrochlorothiazide (HYDRODIURIL) 25 MG tablet TAKE ONE TABLET BY MOUTH EVERY DAY 08/06/13  Yes Fayrene Helper, MD  Linaclotide [facility]) 290 MCG CAPS capsule Take 1 capsule (290 mcg total) by mouth daily. Take 30 minutes before breakfast. 08/17/13  Yes Orvil Feil, NP  nabumetone (RELAFEN) 500 MG tablet Take 1 tablet (500 mg total) by mouth 2 (two) times daily as needed for moderate pain. 08/04/13 01/02/14 Yes Fayrene Helper, MD  pantoprazole (PROTONIX) 40 MG tablet Take 1 tablet (40 mg total) by mouth daily. Take 30 minutes before breakfast. 08/17/13  Yes Orvil Feil, NP  pravastatin (PRAVACHOL) 40 MG tablet Take 1 tablet (40 mg total) by mouth every evening. 10/16/12 10/16/13 Yes Fayrene Helper, MD  tiZANidine (ZANAFLEX) 4 MG tablet Take 1 tablet (4 mg total) by mouth at bedtime as needed for muscle spasms. 08/05/13 08/05/14 Yes Fayrene Helper, MD    Allergies as of 08/25/2013  . (No Known Allergies)    Family History  Problem Relation Age of Onset  . Diabetes Mother   . Hypertension Mother   . Coronary artery disease Mother   . Pneumonia Father   . Hypertension Sister   . Hypertension Sister   . Hypertension Brother   . Hypertension  Brother   . Colon cancer Neg Hx   . Liver disease Neg Hx     History   Social History  . Marital Status: Widowed    Spouse Name: N/A    Number of Children: 4  . Years of Education: N/A   Occupational History  . employed     Social History Main Topics  . Smoking status: Current Every Day Smoker -- 10 years    Types: Cigarettes  . Smokeless tobacco: Never Used     Comment: pt currently uses E-Cigarettes  . Alcohol Use: No  . Drug Use: No  . Sexual Activity: Not on file   Other Topics Concern  . Not on file   Social History  Narrative  . No narrative on file    Review of Systems: See HPI, otherwise negative ROS   Physical Exam: Ht 5\' 6"  (1.676 m)  Wt 235 lb (106.595 kg)  BMI 37.95 kg/m2 General:   Alert,  pleasant and cooperative in NAD Head:  Normocephalic and atraumatic. Neck:  Supple; Lungs:  Clear throughout to auscultation.    Heart:  Regular rate and rhythm. Abdomen:  Soft, nontender and nondistended. Normal bowel sounds, without guarding, and without rebound.   Neurologic:  Alert and  oriented x4;  grossly normal neurologically.  Impression/Plan:     Abnormal ESOPHAGRAM.  Plan:  EGD TODAY

## 2013-09-11 NOTE — Op Note (Signed)
[facility] [address] [place], [address]   ENDOSCOPY PROCEDURE REPORT  PATIENT: Laura, Hurley  MR#: [id number] BIRTHDATE: 08/20/1940 , 72  yrs. old GENDER: Female  ENDOSCOPIST: Barney Drain, MD REFFERED [id number] Moshe Cipro, M.D.  PROCEDURE DATE:  09/11/2013 PROCEDURE:   EGD with biopsy and EGD with dilatation over guidewire   INDICATIONS:1.  dysphagia.-better afteegd/dil AUG 2014 BUT STILL LIKE FOOD STICKS IN ESOPHAGUS.   2.  Barium esophagram showed filling defect in mid-esophagus.Marland Kitchen LINZESS HELPING CONSTIPATION. GAINED 15 LBS SINCE 2013. MEDICATIONS: Demerol 50 mg IV and Versed 3 mg IV TOPICAL ANESTHETIC: Viscous Xylocaine  DESCRIPTION OF PROCEDURE:   After the risks benefits and alternatives of the procedure were thoroughly explained, informed consent was obtained.  The EG-[id number] ([id number])  endoscope was introduced through the mouth and advanced to the second portion of the duodenum. The instrument was slowly withdrawn as the mucosa was carefully examined.  Prior to withdrawal of the scope, the guidwire was placed.  The esophagus was dilated successfully.  The patient was recovered in endoscopy and discharged home in satisfactory condition.   ESOPHAGUS: INCOMPLETE ESOPHAGEAL WEB IN MID-ESOPHAGUS.  NO FILLING DEFECTS APPRECIATED.   A medium sized hiatal hernia was noted. STOMACH: Two small irregular shaped ulcers with surrounding edema were found in the gastric antrum.  Biopsies were taken around the ulcers.   Mild erosive gastritis (inflammation) was found in the gastric antrum.  Multiple biopsies were performed using cold forceps.   DUODENUM: The duodenal mucosa showed no abnormalities in the bulb and second portion of the duodenum.   Dilation was then performed at the mid esophagus Dilator: Savary over guidewire Size(s): 16-18 MM Resistance: minimal Heme: none  COMPLICATIONS: There were no complications.  ENDOSCOPIC IMPRESSION: 1.    INCOMPLETE ESOPHAGEAL WEB IN MID-ESOPHAGUS. 2.   Medium sized hiatal hernia 3.   PUD  RECOMMENDATIONS: STRICTLY AVOID ASPIRIN, BC/GOODY POWDERS, RELAFEN, IBUPROFEN/MOTRIN, OR NAPROXEN/ALEVE. INCREASE PROTONIX.  TAKE 30 MINUTES PRIOR TO YOUR MEALS TWICE DAILY. FOLLOW A LOW FAT DIET. BIOPSY WILL BE BACK IN 7 DAYS. FOLLOW UP IN 4 MOS.      _______________________________ Lorrin MaisBarney Drain, MD 09/11/2013 2:37 PM      PATIENT NAME:  Laura, Hurley MR#: [id number]

## 2013-09-11 NOTE — Progress Notes (Signed)
REVIEWED.  

## 2013-09-14 ENCOUNTER — Encounter (HOSPITAL_COMMUNITY): Payer: Self-pay | Admitting: Gastroenterology

## 2013-09-15 ENCOUNTER — Ambulatory Visit (HOSPITAL_COMMUNITY): Payer: Medicare Other | Admitting: Physical Therapy

## 2013-09-21 ENCOUNTER — Telehealth: Payer: Self-pay | Admitting: Gastroenterology

## 2013-09-21 NOTE — Telephone Encounter (Signed)
Reminder in epic °

## 2013-09-21 NOTE — Telephone Encounter (Signed)
Please call pt. HER stomach Bx shows ULCERS FROM USING [employer]SA PRODUCTS.   CONTINUE PROTONIX. TAKE 30 MINUTES PRIOR TO MEALS TWICE DAILY. CONTINUE YOUR WEIGHT LOSS EFFORTS. Follow a low fat high fiber diet. OPV IN 4 mos e30 constipation ulcers dysphagia. She WILL NEED A REPEAT EGD AFTER HER NEXT VISIT TO MAKE SURE THE ULCERS HAVE HEALED. High-Fiber Diet A high-fiber diet changes your normal diet to include more whole grains, legumes, fruits, and vegetables. Changes in the diet involve replacing refined carbohydrates with unrefined foods. The calorie level of the diet is essentially unchanged. The Dietary Reference Intake (recommended amount) for adult males is 38 grams per day. For adult females, it is 25 grams per day. Pregnant and lactating women should consume 28 grams of fiber per day. Fiber is the intact part of a plant that is not broken down during digestion. Functional fiber is fiber that has been isolated from the plant to provide a beneficial effect in the body. PURPOSE  Increase stool bulk.   Ease and regulate bowel movements.   Lower cholesterol.  INDICATIONS THAT YOU NEED MORE FIBER  Constipation and hemorrhoids.   Uncomplicated diverticulosis (intestine condition) and irritable bowel syndrome.   Weight management.   As a protective measure against hardening of the arteries (atherosclerosis), diabetes, and cancer.   DO NOT USE WITH:  Acute diverticulitis (intestine infection).   Partial small bowel obstructions.   Complicated diverticular disease involving bleeding, rupture (perforation), or abscess (boil, furuncle).   Presence of autonomic neuropathy (nerve damage) or gastroparesis (stomach cannot empty itself).    GUIDELINES FOR INCREASING FIBER IN THE DIET  Start adding fiber to the diet slowly. A gradual increase of about 5 more grams (2 slices of whole-wheat bread, 2 servings of most fruits or vegetables, or 1 bowl of high-fiber cereal) per day is best. Too rapid  an increase in fiber may result in constipation, flatulence, and bloating.   Drink enough water and fluids to keep your urine clear or pale yellow. Water, juice, or caffeine-free drinks are recommended. Not drinking enough fluid may cause constipation.   Eat a variety of high-fiber foods rather than one type of fiber.   Try to increase your intake of fiber through using high-fiber foods rather than fiber pills or supplements that contain small amounts of fiber.   The goal is to change the types of food eaten. Do not supplement your present diet with high-fiber foods, but replace foods in your present diet.    INCLUDE A VARIETY OF FIBER SOURCES  Replace refined and processed grains with whole grains, canned fruits with fresh fruits, and incorporate other fiber sources. White rice, white breads, and most bakery goods contain little or no fiber.   Brown whole-grain rice, buckwheat oats, and many fruits and vegetables are all good sources of fiber. These include: broccoli, Brussels sprouts, cabbage, cauliflower, beets, sweet potatoes, white potatoes (skin on), carrots, tomatoes, eggplant, squash, berries, fresh fruits, and dried fruits.   Cereals appear to be the richest source of fiber. Cereal fiber is found in whole grains and bran. Bran is the fiber-rich outer coat of cereal grain, which is largely removed in refining. In whole-grain cereals, the bran remains. In breakfast cereals, the largest amount of fiber is found in those with "bran" in their names. The fiber content is sometimes indicated on the label.   You may need to include additional fruits and vegetables each day.   In baking, for 1 cup white flour, you may  use the following substitutions:   1 cup whole-wheat flour minus 2 tablespoons.   1/2 cup white flour plus 1/2 cup whole-wheat flour.   Low-Fat Diet BREADS, CEREALS, PASTA, RICE, DRIED PEAS, AND BEANS These products are high in carbohydrates and most are low in fat.  Therefore, they can be increased in the diet as substitutes for fatty foods. They too, however, contain calories and should not be eaten in excess. Cereals can be eaten for snacks as well as for breakfast.   FRUITS AND VEGETABLES It is good to eat fruits and vegetables. Besides being sources of fiber, both are rich in vitamins and some minerals. They help you get the daily allowances of these nutrients. Fruits and vegetables can be used for snacks and desserts.  MEATS Limit lean meat, chicken, [place], and fish to no more than 6 ounces per day. Beef, Pork, and Lamb Use lean cuts of beef, pork, and lamb. Lean cuts include:  Extra-lean ground beef.  Arm roast.  Sirloin tip.  Center-cut ham.  Round steak.  Loin chops.  Rump roast.  Tenderloin.  Trim all fat off the outside of meats before cooking. It is not necessary to severely decrease the intake of red meat, but lean choices should be made. Lean meat is rich in protein and contains a highly absorbable form of iron. Premenopausal women, in particular, should avoid reducing lean red meat because this could increase the risk for low red blood cells (iron-deficiency anemia).  Chicken and [place] These are good sources of protein. The fat of poultry can be reduced by removing the skin and underlying fat layers before cooking. Chicken and [place] can be substituted for lean red meat in the diet. Poultry should not be fried or covered with high-fat sauces. Fish and Shellfish Fish is a good source of protein. Shellfish contain cholesterol, but they usually are low in saturated fatty acids. The preparation of fish is important. Like chicken and [place], they should not be fried or covered with high-fat sauces. EGGS Egg whites contain no fat or cholesterol. They can be eaten often. Try 1 to 2 egg whites instead of whole eggs in recipes or use egg substitutes that do not contain yolk. MILK AND DAIRY PRODUCTS Use skim or 1% milk instead of 2% or whole  milk. Decrease whole milk, natural, and processed cheeses. Use nonfat or low-fat (2%) cottage cheese or low-fat cheeses made from vegetable oils. Choose nonfat or low-fat (1 to 2%) yogurt. Experiment with evaporated skim milk in recipes that call for heavy cream. Substitute low-fat yogurt or low-fat cottage cheese for sour cream in dips and salad dressings. Have at least 2 servings of low-fat dairy products, such as 2 glasses of skim (or 1%) milk each day to help get your daily calcium intake. FATS AND OILS Reduce the total intake of fats, especially saturated fat. Butterfat, lard, and beef fats are high in saturated fat and cholesterol. These should be avoided as much as possible. Vegetable fats do not contain cholesterol, but certain vegetable fats, such as coconut oil, palm oil, and palm kernel oil are very high in saturated fats. These should be limited. These fats are often used in bakery goods, processed foods, popcorn, oils, and nondairy creamers. Vegetable shortenings and some peanut butters contain hydrogenated oils, which are also saturated fats. Read the labels on these foods and check for saturated vegetable oils. Unsaturated vegetable oils and fats do not raise blood cholesterol. However, they should be limited because they are fats  and are high in calories. Total fat should still be limited to 30% of your daily caloric intake. Desirable liquid vegetable oils are corn oil, cottonseed oil, olive oil, canola oil, safflower oil, soybean oil, and sunflower oil. Peanut oil is not as good, but small amounts are acceptable. Buy a heart-healthy tub margarine that has no partially hydrogenated oils in the ingredients. Mayonnaise and salad dressings often are made from unsaturated fats, but they should also be limited because of their high calorie and fat content. Seeds, nuts, peanut butter, olives, and avocados are high in fat, but the fat is mainly the unsaturated type. These foods should be limited mainly  to avoid excess calories and fat. OTHER EATING TIPS Snacks  Most sweets should be limited as snacks. They tend to be rich in calories and fats, and their caloric content outweighs their nutritional value. Some good choices in snacks are graham crackers, melba toast, soda crackers, bagels (no egg), English muffins, fruits, and vegetables. These snacks are preferable to snack crackers, [place] fries, TORTILLA CHIPS, and POTATO chips. Popcorn should be air-popped or cooked in small amounts of liquid vegetable oil. Desserts Eat fruit, low-fat yogurt, and fruit ices instead of pastries, cake, and cookies. Sherbet, angel food cake, gelatin dessert, frozen low-fat yogurt, or other frozen products that do not contain saturated fat (pure fruit juice bars, frozen ice pops) are also acceptable.  COOKING METHODS Choose those methods that use little or no fat. They include: Poaching.  Braising.  Steaming.  Grilling.  Baking.  Stir-frying.  Broiling.  Microwaving.  Foods can be cooked in a nonstick pan without added fat, or use a nonfat cooking spray in regular cookware. Limit fried foods and avoid frying in saturated fat. Add moisture to lean meats by using water, broth, cooking wines, and other nonfat or low-fat sauces along with the cooking methods mentioned above. Soups and stews should be chilled after cooking. The fat that forms on top after a few hours in the refrigerator should be skimmed off. When preparing meals, avoid using excess salt. Salt can contribute to raising blood pressure in some people.  EATING AWAY FROM HOME Order entres, potatoes, and vegetables without sauces or butter. When meat exceeds the size of a deck of cards (3 to 4 ounces), the rest can be taken home for another meal. Choose vegetable or fruit salads and ask for low-calorie salad dressings to be served on the side. Use dressings sparingly. Limit high-fat toppings, such as bacon, crumbled eggs, cheese, sunflower seeds, and  olives. Ask for heart-healthy tub margarine instead of butter.

## 2013-09-22 NOTE — Telephone Encounter (Signed)
Called and informed pt.  

## 2013-09-24 ENCOUNTER — Telehealth: Payer: Self-pay

## 2013-09-24 MED ORDER — LUBIPROSTONE 24 MCG PO CAPS: 24.0000 ug | ORAL_CAPSULE | Freq: Two times a day (BID) | ORAL | Status: DC

## 2013-09-24 NOTE — Telephone Encounter (Signed)
Pt called and said she got the Linzess 290 mcg for the first month with her voucher. Michela Pitcher it helps a lot.  But she went to get her refill and it will cost her $193.00. She just cannot afford that.  Please advise!

## 2013-09-24 NOTE — Telephone Encounter (Signed)
Pt is aware and also aware that she is to take it with food.

## 2013-09-24 NOTE — Telephone Encounter (Signed)
May trial Amitiza 24 mcg po BID. I have sent rx.

## 2013-10-06 ENCOUNTER — Other Ambulatory Visit: Payer: Self-pay

## 2013-10-06 MED ORDER — LUBIPROSTONE 24 MCG PO CAPS: 24.0000 ug | ORAL_CAPSULE | Freq: Two times a day (BID) | ORAL | Status: DC

## 2013-10-06 NOTE — Progress Notes (Signed)
REVIEWED. TCS AUG 2014 SIMPLE ADENOMA, MELANOSIS COLI EGD AUG 2014 GASTRITIS

## 2013-10-19 LAB — COMPREHENSIVE METABOLIC PANEL
ALK PHOS: 101 U/L (ref 39–117)
ALT: 13 U/L (ref 0–35)
AST: 18 U/L (ref 0–37)
Albumin: 3.9 g/dL (ref 3.5–5.2)
BUN: 12 mg/dL (ref 6–23)
CO2: 27 mEq/L (ref 19–32)
CREATININE: 0.79 mg/dL (ref 0.50–1.10)
Calcium: 9 mg/dL (ref 8.4–10.5)
Chloride: 101 mEq/L (ref 96–112)
Glucose, Bld: 102 mg/dL — ABNORMAL HIGH (ref 70–99)
POTASSIUM: 4.3 meq/L (ref 3.5–5.3)
Sodium: 137 mEq/L (ref 135–145)
Total Bilirubin: 0.7 mg/dL (ref 0.2–1.2)
Total Protein: 6.9 g/dL (ref 6.0–8.3)

## 2013-10-19 LAB — HEMOGLOBIN A1C
HEMOGLOBIN A1C: 6.2 % — AB (ref <5.7)
MEAN PLASMA GLUCOSE: 131 mg/dL — AB (ref <117)

## 2013-10-19 LAB — LIPID PANEL
CHOL/HDL RATIO: 3.9 ratio
Cholesterol: 185 mg/dL (ref 0–200)
HDL: 47 mg/dL (ref >39)
LDL CALC: 114 mg/dL — AB (ref 0–99)
Triglycerides: 119 mg/dL (ref <150)
VLDL: 24 mg/dL (ref 0–40)

## 2013-10-21 ENCOUNTER — Encounter: Payer: Self-pay | Admitting: Family Medicine

## 2013-10-21 ENCOUNTER — Encounter (INDEPENDENT_AMBULATORY_CARE_PROVIDER_SITE_OTHER): Payer: Self-pay

## 2013-10-21 ENCOUNTER — Ambulatory Visit (INDEPENDENT_AMBULATORY_CARE_PROVIDER_SITE_OTHER): Payer: Medicare Other | Admitting: Family Medicine

## 2013-10-21 VITALS — BP 130/80 | HR 84 | Resp 16 | Wt 230.1 lb

## 2013-10-21 DIAGNOSIS — R7303 Prediabetes: Secondary | ICD-10-CM

## 2013-10-21 DIAGNOSIS — R7309 Other abnormal glucose: Secondary | ICD-10-CM

## 2013-10-21 DIAGNOSIS — I10 Essential (primary) hypertension: Secondary | ICD-10-CM

## 2013-10-21 DIAGNOSIS — F17200 Nicotine dependence, unspecified, uncomplicated: Secondary | ICD-10-CM

## 2013-10-21 DIAGNOSIS — E669 Obesity, unspecified: Secondary | ICD-10-CM

## 2013-10-21 DIAGNOSIS — E785 Hyperlipidemia, unspecified: Secondary | ICD-10-CM

## 2013-10-21 DIAGNOSIS — J449 Chronic obstructive pulmonary disease, unspecified: Secondary | ICD-10-CM

## 2013-10-21 DIAGNOSIS — J309 Allergic rhinitis, unspecified: Secondary | ICD-10-CM

## 2013-10-21 MED ORDER — BUPROPION HCL ER (SR) 150 MG PO TB12: 150.0000 mg | ORAL_TABLET | Freq: Two times a day (BID) | ORAL | Status: DC

## 2013-10-21 NOTE — Assessment & Plan Note (Signed)
Increased symptoms with season change, pt to use flonase daily

## 2013-10-21 NOTE — Assessment & Plan Note (Signed)
Controlled, no change in medication DASH diet and commitment to daily physical activity for a minimum of 30 minutes discussed and encouraged, as a part of hypertension management. The importance of attaining a healthy weight is also discussed.  

## 2013-10-21 NOTE — Assessment & Plan Note (Signed)
Deteriorated due to ongoing nicotine use , counseled to quit

## 2013-10-21 NOTE — Assessment & Plan Note (Signed)
Deteriorated Patient educated about the importance of limiting  Carbohydrate intake , the need to commit to daily physical activity for a minimum of 30 minutes , and to commit weight loss. The fact that changes in all these areas will reduce or eliminate all together the development of diabetes is stressed.    

## 2013-10-21 NOTE — Patient Instructions (Signed)
Physical exam in 4 month, call if you need me before  Please plan to quit smoking in 2 weeks, and start the welbutrin today  Please start with regular physical activity, and also eat less so that you lose the 10 pounds have gained  Cholesterol is bette, sugar has increased so leave the batter!!!

## 2013-10-21 NOTE — Assessment & Plan Note (Signed)
Deteriorated increased up to 7 per day, wants zyban to help with quitting, this has helped in the past Patient counseled for approximately 5 minutes regarding the health risks of ongoing nicotine use, specifically all types of cancer, heart disease, stroke and respiratory failure. The options available for help with cessation ,the behavioral changes to assist the process, and the option to either gradully reduce usage  Or abruptly stop.is also discussed. Pt is also encouraged to set specific goals in number of cigarettes used daily, as well as to set a quit date.

## 2013-10-21 NOTE — Assessment & Plan Note (Signed)
Improved, though not at goal Hyperlipidemia:Low fat diet discussed and encouraged.  Updated lab needed at/ before next visit.  

## 2013-10-21 NOTE — Assessment & Plan Note (Signed)
Deteriorated. Patient re-educated about  the importance of commitment to a  minimum of 150 minutes of exercise per week. The importance of healthy food choices with portion control discussed. Encouraged to start a food diary, count calories and to consider  joining a support group. Sample diet sheets offered. Goals set by the patient for the next several months.    

## 2013-10-21 NOTE — Progress Notes (Signed)
Subjective:    Patient ID: [name], female    DOB: 05-09-1941, 73 y.o.   MRN: [id number]  HPI The PT is here for follow up and re-evaluation of chronic medical conditions, medication management and review of any available recent lab and radiology data.  Preventive health is updated, specifically  Cancer screening and Immunization.    The PT denies any adverse reactions to current medications since the last visit.  Smoking has increased and she is requesting help to quit using zyban which has helped in the past, unwilling to set a quit date currently, states when she quit in the past she gained weight. No regular exercise in past 3 to 4 months, but plans to resume now weather is better     Review of Systems See HPI Denies recent fever or chills. Denies ear pain or sore throat. Denies chest congestion, productive cough or wheezing.Coughs yellow sputum in early morning draining from sinuses, in past 2 to 3 weeks, also increased nasal congestion and some sinus pressure Denies chest pains, palpitations and leg swelling Denies abdominal pain, nausea, vomiting,diarrhea or constipation.   Denies dysuria, frequency, hesitancy or incontinence. Denies joint pain, swelling and limitation in mobility. Denies headaches, seizures, numbness, or tingling. Denies depression, anxiety or insomnia. Denies skin break down or rash.        Objective:   Physical Exam  BP 130/80  Pulse 84  Resp 16  Wt 230 lb 1.9 oz (104.382 kg)  SpO2 97% Patient alert and oriented and in no cardiopulmonary distress.  HEENT: No facial asymmetry, EOMI, no sinus tenderness,  oropharynx pink and moist.  Neck adequate though reduced ROM, no JVD, no adenopathy.  Chest: Clear to auscultation bilaterally.Decreased air entry throughout  CVS: S1, S2 no murmurs, no S3.  ABD: Soft non tender. Bowel sounds normal.  Ext: No edema  MS: Adequate though reduced  ROM spine, shoulders, hips and knees.  Skin:  Intact, no ulcerations or rash noted.  Psych: Good eye contact, normal affect. Memory intact not anxious or depressed appearing.  CNS: CN 2-12 intact, power, tone and sensation normal throughout.       Assessment & Plan:  HYPERTENSION Controlled, no change in medication DASH diet and commitment to daily physical activity for a minimum of 30 minutes discussed and encouraged, as a part of hypertension management. The importance of attaining a healthy weight is also discussed.   COPD (chronic obstructive pulmonary disease) Deteriorated due to ongoing nicotine use , counseled to quit  Allergic rhinitis Increased symptoms with season change, pt to use flonase daily  Prediabetes Deteriorated Patient educated about the importance of limiting  Carbohydrate intake , the need to commit to daily physical activity for a minimum of 30 minutes , and to commit weight loss. The fact that changes in all these areas will reduce or eliminate all together the development of diabetes is stressed.     OBESITY Deteriorated. Patient re-educated about  the importance of commitment to a  minimum of 150 minutes of exercise per week. The importance of healthy food choices with portion control discussed. Encouraged to start a food diary, count calories and to consider  joining a support group. Sample diet sheets offered. Goals set by the patient for the next several months.     Other and unspecified hyperlipidemia Improved, though not at goal. Hyperlipidemia:Low fat diet discussed and encouraged.  Updated lab needed at/ before next visit.    NICOTINE ADDICTION Deteriorated increased up to 7  per day, wants zyban to help with quitting, this has helped in the past Patient counseled for approximately 5 minutes regarding the health risks of ongoing nicotine use, specifically all types of cancer, heart disease, stroke and respiratory failure. The options available for help with cessation ,the  behavioral changes to assist the process, and the option to either gradully reduce usage  Or abruptly stop.is also discussed. Pt is also encouraged to set specific goals in number of cigarettes used daily, as well as to set a quit date.

## 2013-11-19 ENCOUNTER — Other Ambulatory Visit: Payer: Self-pay | Admitting: Family Medicine

## 2014-01-03 ENCOUNTER — Encounter: Payer: Self-pay | Admitting: Gastroenterology

## 2014-02-02 ENCOUNTER — Emergency Department (HOSPITAL_COMMUNITY)
Admission: EM | Admit: 2014-02-02 | Discharge: 2014-02-02 | Disposition: A | Discharge status: Home / Self Care | Payer: Medicare Other | Attending: Emergency Medicine | Admitting: Emergency Medicine

## 2014-02-02 ENCOUNTER — Emergency Department (HOSPITAL_COMMUNITY): Payer: Medicare Other

## 2014-02-02 ENCOUNTER — Encounter (HOSPITAL_COMMUNITY): Payer: Self-pay | Admitting: Emergency Medicine

## 2014-02-02 DIAGNOSIS — J4489 Other specified chronic obstructive pulmonary disease: Secondary | ICD-10-CM | POA: Insufficient documentation

## 2014-02-02 DIAGNOSIS — J449 Chronic obstructive pulmonary disease, unspecified: Secondary | ICD-10-CM | POA: Insufficient documentation

## 2014-02-02 DIAGNOSIS — Z79899 Other long term (current) drug therapy: Secondary | ICD-10-CM | POA: Insufficient documentation

## 2014-02-02 DIAGNOSIS — R21 Rash and other nonspecific skin eruption: Secondary | ICD-10-CM | POA: Insufficient documentation

## 2014-02-02 DIAGNOSIS — IMO0002 Reserved for concepts with insufficient information to code with codable children: Secondary | ICD-10-CM | POA: Insufficient documentation

## 2014-02-02 DIAGNOSIS — K219 Gastro-esophageal reflux disease without esophagitis: Secondary | ICD-10-CM | POA: Insufficient documentation

## 2014-02-02 DIAGNOSIS — F17200 Nicotine dependence, unspecified, uncomplicated: Secondary | ICD-10-CM | POA: Insufficient documentation

## 2014-02-02 DIAGNOSIS — E669 Obesity, unspecified: Secondary | ICD-10-CM | POA: Insufficient documentation

## 2014-02-02 DIAGNOSIS — I10 Essential (primary) hypertension: Secondary | ICD-10-CM | POA: Insufficient documentation

## 2014-02-02 DIAGNOSIS — Z8739 Personal history of other diseases of the musculoskeletal system and connective tissue: Secondary | ICD-10-CM | POA: Insufficient documentation

## 2014-02-02 LAB — CBC WITH DIFFERENTIAL/PLATELET
BASOS ABS: 0.1 10*3/uL (ref 0.0–0.1)
Basophils Relative: 1 % (ref 0–1)
EOS PCT: 4 % (ref 0–5)
Eosinophils Absolute: 0.3 10*3/uL (ref 0.0–0.7)
HCT: 36 % (ref 36.0–46.0)
Hemoglobin: 12.4 g/dL (ref 12.0–15.0)
LYMPHS ABS: 2.4 10*3/uL (ref 0.7–4.0)
Lymphocytes Relative: 34 % (ref 12–46)
MCH: 29.5 pg (ref 26.0–34.0)
MCHC: 34.4 g/dL (ref 30.0–36.0)
MCV: 85.7 fL (ref 78.0–100.0)
MONO ABS: 0.7 10*3/uL (ref 0.1–1.0)
Monocytes Relative: 9 % (ref 3–12)
Neutro Abs: 3.7 10*3/uL (ref 1.7–7.7)
Neutrophils Relative %: 52 % (ref 43–77)
PLATELETS: 252 10*3/uL (ref 150–400)
RBC: 4.2 MIL/uL (ref 3.87–5.11)
RDW: 14.4 % (ref 11.5–15.5)
WBC: 7.2 10*3/uL (ref 4.0–10.5)

## 2014-02-02 LAB — COMPREHENSIVE METABOLIC PANEL
ALT: 18 U/L (ref 0–35)
AST: 20 U/L (ref 0–37)
Albumin: 3.3 g/dL — ABNORMAL LOW (ref 3.5–5.2)
Alkaline Phosphatase: 92 U/L (ref 39–117)
Anion gap: 10 (ref 5–15)
BUN: 8 mg/dL (ref 6–23)
CALCIUM: 8.3 mg/dL — AB (ref 8.4–10.5)
CO2: 30 meq/L (ref 19–32)
Chloride: 103 mEq/L (ref 96–112)
Creatinine, Ser: 0.87 mg/dL (ref 0.50–1.10)
GFR, EST AFRICAN AMERICAN: 76 mL/min — AB (ref >90)
GFR, EST NON AFRICAN AMERICAN: 65 mL/min — AB (ref >90)
GLUCOSE: 121 mg/dL — AB (ref 70–99)
Potassium: 4.2 mEq/L (ref 3.7–5.3)
Sodium: 143 mEq/L (ref 137–147)
Total Bilirubin: 0.4 mg/dL (ref 0.3–1.2)
Total Protein: 6.9 g/dL (ref 6.0–8.3)

## 2014-02-02 LAB — TROPONIN I

## 2014-02-02 NOTE — ED Notes (Addendum)
Pt arrived via EMS, states has been SOB with intermittent epigastric pain since last night, mostly on ambulation. Pt states she has a hernia that is aggravated and pain shoots into chest.

## 2014-02-02 NOTE — Discharge Instructions (Signed)
Follow up with your md this week for recheck  °

## 2014-02-02 NOTE — ED Provider Notes (Signed)
CSN: [id number]     Arrival date & time 02/02/14  0941 History  This chart was scribed for Maudry Diego, MD,  by Stacy Gardner, ED Scribe. The patient was seen in room [facility]18[facility] and the patient's care was started at 9:58 AM.    First MD Initiated Contact with Patient 02/02/14 [id number]     Chief Complaint  Patient presents with  . Shortness of Breath     (Consider location/radiation/quality/duration/timing/severity/associated sxs/prior Treatment) Patient is a 73 y.o. female presenting with shortness of breath. The history is provided by the patient and medical records. No language interpreter was used.  Shortness of Breath Severity:  Moderate Onset quality:  Sudden Timing:  Intermittent Chronicity:  New Context: activity   Relieved by:  Nothing Worsened by:  Movement and exertion Ineffective treatments:  None tried Associated symptoms: abdominal pain    HPI Comments: [name] is a 73 y.o. female, w/ hx of hiatal hernia to her epigastric region, presents to the Emergency Department complaining of SOB, onset last night. Pt explains as she was walking back to her home after taking out the trash she felt something "come up" near her epigastric region.S he also reports that she tried to lift a cinder block and felt the same sensation again. Nothing seems to help. Pt lives at home alone.  Past Medical History  Diagnosis Date  . Sinusitis   . OA (osteoarthritis) of knee   . Nicotine addiction   . COPD (chronic obstructive pulmonary disease)   . Obesity   . Hypertension   . Lung nodule seen on imaging study    Past Surgical History  Procedure Laterality Date  . Knee arthroscopy right    . Cholecystectomy    . Total knee arthroplasty right  05/29/2005    Dr. Aline Brochure   . Bilateral cataract surgery  7/27 & 03/01/09    Dr. Gershon Crane    . Partial hysterectomy    . Esophagogastroduodenoscopy  11/07/2010    Jenkins:hiatal hernia/no evidence of Barrett esophagitis.  The Z-line  was noted to be at 39 cm from the teeth. CLO test negative  . Colonoscopy  04/2004    Dr. Leane Para Smith-->melanosis coli  . Colonoscopy with esophagogastroduodenoscopy (egd) N/A 02/25/2013    Dr. Fields:Normal mucosa in the terminal ileum/Severe melanosis throughout the entire examined colon/ONE COLON POLYP REMOVED (tubular adenoma)/Mild diverticulosis  in the sigmoid colon/EGD:The mucosa of the esophagus appeared normal/Non-erosive gastritis, empiric dilation with Savary dilator  . Savory dilation N/A 02/25/2013    Procedure: SAVORY DILATION;  Surgeon: Danie Binder, MD;  Location: [facility];  Service: Endoscopy;  Laterality: N/A;  Venia Minks dilation N/A 02/25/2013    Procedure: Venia Minks DILATION;  Surgeon: Danie Binder, MD;  Location: [facility];  Service: Endoscopy;  Laterality: N/A;  . Esophagogastroduodenoscopy N/A 09/11/2013    Procedure: ESOPHAGOGASTRODUODENOSCOPY (EGD);  Surgeon: Danie Binder, MD;  Location: [facility];  Service: Endoscopy;  Laterality: N/A;  10:15   Family History  Problem Relation Age of Onset  . Diabetes Mother   . Hypertension Mother   . Coronary artery disease Mother   . Pneumonia Father   . Hypertension Sister   . Hypertension Sister   . Hypertension Brother   . Hypertension Brother   . Colon cancer Neg Hx   . Liver disease Neg Hx    History  Substance Use Topics  . Smoking status: Current Every Day Smoker -- 10 years    Types:  Cigarettes  . Smokeless tobacco: Never Used     Comment: pt currently uses E-Cigarettes  . Alcohol Use: No   OB History   Grav Para Term Preterm Abortions TAB SAB Ect Mult Living   4 4 4       4      Review of Systems  Respiratory: Positive for shortness of breath.   Gastrointestinal: Positive for abdominal pain.  All other systems reviewed and are negative.     Allergies  Review of patient's allergies indicates no known allergies.  Home Medications   Prior to Admission medications   Medication Sig Start  Date End Date Taking? Authorizing Provider  ADVAIR DISKUS 100-50 MCG/DOSE AEPB  02/20/13   Historical Provider, MD  buPROPion (WELLBUTRIN SR) 150 MG 12 hr tablet Take 1 tablet (150 mg total) by mouth 2 (two) times daily. 10/21/13   Fayrene Helper, MD  calcium-vitamin D (OSCAL WITH D) 500-200 MG-UNIT per tablet Take 1 tablet by mouth 2 (two) times daily. 05/20/12   Fayrene Helper, MD  fluticasone (FLONASE) 50 MCG/ACT nasal spray Place 2 sprays into both nostrils daily. 08/06/13 08/06/14  Fayrene Helper, MD  hydrochlorothiazide (HYDRODIURIL) 25 MG tablet TAKE ONE TABLET BY MOUTH EVERY DAY 08/06/13   Fayrene Helper, MD  pantoprazole (PROTONIX) 40 MG tablet 1 PO 30 minutes before breakfast AND SUPPER. 09/11/13   Danie Binder, MD  pravastatin (PRAVACHOL) 40 MG tablet TAKE 1 TABLET BY MOUTH EVERY EVENING    Fayrene Helper, MD   BP 170/91  Pulse 82  Temp(Src) 97.5 F (36.4 C) (Oral)  Resp 18  Ht 5\' 7"  (1.702 m)  Wt 240 lb (108.863 kg)  BMI 37.58 kg/m2  SpO2 99% Physical Exam  Nursing note and vitals reviewed. Constitutional: She is oriented to person, place, and time. She appears well-developed and well-nourished. No distress.  HENT:  Head: Normocephalic and atraumatic.  Mouth/Throat: Oropharynx is clear and moist.  Eyes: Conjunctivae and EOM are normal.  Neck: Normal range of motion. Neck supple.  Cardiovascular: Normal rate, regular rhythm and normal heart sounds.   Pulmonary/Chest: Effort normal and breath sounds normal. No respiratory distress.  Musculoskeletal: Normal range of motion. She exhibits no edema.  Neurological: She is alert and oriented to person, place, and time. No sensory deficit.  Skin: Skin is warm and dry. Rash (mild rash to left side of face) noted.  Psychiatric: She has a normal mood and affect. Her behavior is normal.    ED Course  Procedures (including critical care time) DIAGNOSTIC STUDIES: Oxygen Saturation is 99% on room air, normal by my  interpretation.    COORDINATION OF CARE:  10:01 AM Discussed course of care with pt which includes chest x-ray, laboratory tests, troponin and EKG . Pt understands and agrees.   Labs Review Labs Reviewed - No data to display  Imaging Review No results found.   EKG Interpretation   Date/Time:  Tuesday February 02 2014 09:50:28 EDT Ventricular Rate:  83 PR Interval:  183 QRS Duration: 73 QT Interval:  393 QTC Calculation: 462 R Axis:   53 Text Interpretation:  Sinus rhythm Confirmed by Usama Harkless  MD, Zakeya Junker ([id number])  on 02/02/2014 1:28:14 PM      MDM   Final diagnoses:  None    Sob,  Possible gerd,  Nl studies and resolved symptoms.  F/u with pcp  The chart was scribed for me under my direct supervision.  I personally performed the history, physical, and  medical decision making and all procedures in the evaluation of this patient.Maudry Diego, MD 02/02/14 1329

## 2014-02-04 ENCOUNTER — Other Ambulatory Visit: Payer: Self-pay | Admitting: Family Medicine

## 2014-02-04 DIAGNOSIS — Z139 Encounter for screening, unspecified: Secondary | ICD-10-CM

## 2014-02-08 ENCOUNTER — Ambulatory Visit (HOSPITAL_COMMUNITY)
Admission: RE | Admit: 2014-02-08 | Discharge: 2014-02-08 | Disposition: A | Discharge status: Home / Self Care | Payer: Medicare Other | Source: Ambulatory Visit | Attending: Family Medicine | Admitting: Family Medicine

## 2014-02-08 DIAGNOSIS — Z1231 Encounter for screening mammogram for malignant neoplasm of breast: Secondary | ICD-10-CM | POA: Insufficient documentation

## 2014-02-08 DIAGNOSIS — Z139 Encounter for screening, unspecified: Secondary | ICD-10-CM

## 2014-02-16 ENCOUNTER — Encounter: Payer: Self-pay | Admitting: Family Medicine

## 2014-02-16 ENCOUNTER — Ambulatory Visit (INDEPENDENT_AMBULATORY_CARE_PROVIDER_SITE_OTHER): Payer: Medicare Other | Admitting: Family Medicine

## 2014-02-16 VITALS — BP 132/80 | HR 92 | Resp 18 | Ht 66.0 in | Wt 223.1 lb

## 2014-02-16 DIAGNOSIS — R911 Solitary pulmonary nodule: Secondary | ICD-10-CM

## 2014-02-16 DIAGNOSIS — E669 Obesity, unspecified: Secondary | ICD-10-CM

## 2014-02-16 DIAGNOSIS — I10 Essential (primary) hypertension: Secondary | ICD-10-CM

## 2014-02-16 DIAGNOSIS — R1900 Intra-abdominal and pelvic swelling, mass and lump, unspecified site: Secondary | ICD-10-CM

## 2014-02-16 DIAGNOSIS — Z23 Encounter for immunization: Secondary | ICD-10-CM | POA: Insufficient documentation

## 2014-02-16 NOTE — Assessment & Plan Note (Signed)
Vaccine administered.

## 2014-02-16 NOTE — Patient Instructions (Addendum)
F./u as before  You are referred  For chest and abdominal scans  BP today is good , no medication change needed.  prevnar  administered

## 2014-02-16 NOTE — Progress Notes (Signed)
   Subjective:    Patient ID: ANNETE Hurley, female    DOB: June 15, 1941, 73 y.o.   MRN: [id number]  HPI Pt in from ED f/u due to acute SOB , which initially occurred when she was trying to pull her trash can, she nearly fell and had to call EMS C/o mass in abdomen, has been told she has a hernia and wants this checked Ongoing nicotine use with shortness of breath with activity and h/o lung nodules   Review of Systems See HPI Denies recent fever or chills. Denies sinus pressure, nasal congestion, ear pain or sore throat. Denies chest congestion, productive cough or wheezing.  Denies dysuria, frequency, hesitancy or incontinence. Denies uncontrolled  joint pain, swelling and limitation in mobility. Denies headaches, seizures, numbness, or tingling. Denies depression, anxiety or insomnia. Denies skin break down or rash.        Objective:   Physical Exam  BP 132/80  Pulse 92  Resp 18  Ht 5\' 6"  (1.676 m)  Wt 223 lb 1.9 oz (101.207 kg)  BMI 36.03 kg/m2  SpO2 96% Patient alert and oriented and in no cardiopulmonary distress.  HEENT: No facial asymmetry, EOMI,   oropharynx pink and moist.  Neck supple no JVD, no mass.  Chest: Clear to auscultation bilaterally.  CVS: S1, S2 no murmurs, no S3.Regular rate.  ABD: Soft non tender. Possible LUQ mass and central abdominal mass  Ext: No edema  MS: Adequate ROM spine, shoulders, hips and knees.  Skin: Intact, no ulcerations or rash noted.  Psych: Good eye contact, normal affect. Memory intact not anxious or depressed appearing.  CNS: CN 2-12 intact, power,  normal throughout.no focal deficits noted.       Assessment & Plan:  Need for vaccination with 13-polyvalent pneumococcal conjugate vaccine Vaccine administered  HYPERTENSION Presented to ED with ellevated BP, now controlled, continue medication as before   Lung nodule seen on imaging study onngoing nicotine use with lung nodules, rept scan needed Patient  counseled for approximately 5 minutes regarding the health risks of ongoing nicotine use, specifically all types of cancer, heart disease, stroke and respiratory failure. The options available for help with cessation ,the behavioral changes to assist the process, and the option to either gradully reduce usage  Or abruptly stop.is also discussed. Pt is also encouraged to set specific goals in number of cigarettes used daily, as well as to set a quit date.   Abdominal mass Abdominal  Mass per pt report and h/o hernia, needs imaging study, exam in office limited by patient size and abdominal girth  OBESITY Improved. Pt applauded on succesful weight loss through lifestyle change, and encouraged to continue same. Weight loss goal set for the next several months.

## 2014-02-17 ENCOUNTER — Ambulatory Visit: Payer: Medicare Other | Admitting: Gastroenterology

## 2014-02-19 ENCOUNTER — Ambulatory Visit (HOSPITAL_COMMUNITY)
Admission: RE | Admit: 2014-02-19 | Discharge: 2014-02-19 | Disposition: A | Discharge status: Home / Self Care | Payer: Medicare Other | Source: Ambulatory Visit | Attending: Family Medicine | Admitting: Family Medicine

## 2014-02-19 DIAGNOSIS — R918 Other nonspecific abnormal finding of lung field: Secondary | ICD-10-CM | POA: Diagnosis not present

## 2014-02-19 DIAGNOSIS — R0602 Shortness of breath: Secondary | ICD-10-CM | POA: Diagnosis present

## 2014-02-19 DIAGNOSIS — R1900 Intra-abdominal and pelvic swelling, mass and lump, unspecified site: Secondary | ICD-10-CM | POA: Diagnosis not present

## 2014-02-19 DIAGNOSIS — I7090 Unspecified atherosclerosis: Secondary | ICD-10-CM | POA: Insufficient documentation

## 2014-02-19 DIAGNOSIS — F17200 Nicotine dependence, unspecified, uncomplicated: Secondary | ICD-10-CM | POA: Insufficient documentation

## 2014-02-19 DIAGNOSIS — R911 Solitary pulmonary nodule: Secondary | ICD-10-CM

## 2014-02-19 DIAGNOSIS — I2510 Atherosclerotic heart disease of native coronary artery without angina pectoris: Secondary | ICD-10-CM | POA: Diagnosis not present

## 2014-02-19 MED ORDER — IOHEXOL 300 MG/ML  SOLN
100.0000 mL | Freq: Once | INTRAMUSCULAR | Status: AC | PRN
  Administered 2014-02-19: 100 mL via INTRAVENOUS

## 2014-02-23 ENCOUNTER — Other Ambulatory Visit: Payer: Self-pay

## 2014-02-23 ENCOUNTER — Other Ambulatory Visit: Payer: Self-pay | Admitting: Family Medicine

## 2014-02-23 DIAGNOSIS — I10 Essential (primary) hypertension: Secondary | ICD-10-CM

## 2014-02-23 DIAGNOSIS — E785 Hyperlipidemia, unspecified: Secondary | ICD-10-CM

## 2014-02-23 DIAGNOSIS — Z87891 Personal history of nicotine dependence: Secondary | ICD-10-CM

## 2014-02-23 DIAGNOSIS — I2510 Atherosclerotic heart disease of native coronary artery without angina pectoris: Secondary | ICD-10-CM

## 2014-02-23 DIAGNOSIS — R7302 Impaired glucose tolerance (oral): Secondary | ICD-10-CM

## 2014-02-23 DIAGNOSIS — R7303 Prediabetes: Secondary | ICD-10-CM

## 2014-02-23 DIAGNOSIS — R918 Other nonspecific abnormal finding of lung field: Secondary | ICD-10-CM

## 2014-02-24 ENCOUNTER — Other Ambulatory Visit (HOSPITAL_COMMUNITY)
Admission: RE | Admit: 2014-02-24 | Discharge: 2014-02-24 | Disposition: A | Discharge status: Home / Self Care | Payer: Medicare Other | Source: Ambulatory Visit | Attending: Family Medicine | Admitting: Family Medicine

## 2014-02-24 ENCOUNTER — Encounter: Payer: Self-pay | Admitting: Family Medicine

## 2014-02-24 ENCOUNTER — Ambulatory Visit (INDEPENDENT_AMBULATORY_CARE_PROVIDER_SITE_OTHER): Payer: Medicare Other | Admitting: Family Medicine

## 2014-02-24 VITALS — BP 142/78 | HR 78 | Resp 18 | Ht 66.0 in | Wt 223.0 lb

## 2014-02-24 DIAGNOSIS — IMO0002 Reserved for concepts with insufficient information to code with codable children: Secondary | ICD-10-CM | POA: Insufficient documentation

## 2014-02-24 DIAGNOSIS — Z1211 Encounter for screening for malignant neoplasm of colon: Secondary | ICD-10-CM

## 2014-02-24 DIAGNOSIS — I10 Essential (primary) hypertension: Secondary | ICD-10-CM

## 2014-02-24 DIAGNOSIS — Z124 Encounter for screening for malignant neoplasm of cervix: Secondary | ICD-10-CM | POA: Insufficient documentation

## 2014-02-24 DIAGNOSIS — Z0000 Encounter for general adult medical examination without abnormal findings: Secondary | ICD-10-CM

## 2014-02-24 LAB — POC HEMOCCULT BLD/STL (OFFICE/1-CARD/DIAGNOSTIC): Fecal Occult Blood, POC: NEGATIVE

## 2014-02-24 MED ORDER — CEPHALEXIN 500 MG PO CAPS: 500.0000 mg | ORAL_CAPSULE | Freq: Two times a day (BID) | ORAL | Status: DC

## 2014-02-24 NOTE — Assessment & Plan Note (Signed)
onngoing nicotine use with lung nodules, rept scan needed Patient counseled for approximately 5 minutes regarding the health risks of ongoing nicotine use, specifically all types of cancer, heart disease, stroke and respiratory failure. The options available for help with cessation ,the behavioral changes to assist the process, and the option to either gradully reduce usage  Or abruptly stop.is also discussed. Pt is also encouraged to set specific goals in number of cigarettes used daily, as well as to set a quit date.

## 2014-02-24 NOTE — Patient Instructions (Signed)
F/u in 3.5 month, call if you need me before    Arrange for flu vaccine in office  Pls keep cardiology appt.  Continue to work on healthy food choice and reduced portion size  Five day antibiotic course prescribed for red swelling on left upper arm   \

## 2014-02-24 NOTE — Addendum Note (Signed)
Addended by: Denman George B on: 02/24/2014 02:43 PM   Modules accepted: Orders

## 2014-02-24 NOTE — Assessment & Plan Note (Signed)
Annual exam as documented. Counseling done  re healthy lifestyle involving commitment to 150 minutes exercise per week, heart healthy diet, and attaining healthy weight.The importance of adequate sleep also discussed. Regular seat belt use and safe storage  of firearms if patient has them, is also discussed. Changes in health habits are decided on by the patient with goals and time frames  set for achieving them. Immunization and cancer screening needs are specifically addressed at this visit.  

## 2014-02-24 NOTE — Assessment & Plan Note (Signed)
Improved. Pt applauded on succesful weight loss through lifestyle change, and encouraged to continue same. Weight loss goal set for the next several months.  

## 2014-02-24 NOTE — Assessment & Plan Note (Signed)
Abdominal  Mass per pt report and h/o hernia, needs imaging study, exam in office limited by patient size and abdominal girth

## 2014-02-24 NOTE — Progress Notes (Signed)
   Subjective:    Patient ID: [name], female    DOB: February 10, 1941, 73 y.o.   MRN: [id number]  HPI Patient is in for annual  exam. 1 day h/o painful red swelling of left arm, no known trauma , no drainage from the area Recent lung scan reports CAD, she has upcoming cardiology appointment to follow throug on this and understands the importance Recent scan reviewed, and she now accepts the fact that she has no hernia  Review of Systems See HPI     Objective:   Physical Exam BP 142/78  Pulse 78  Resp 18  Ht 5\' 6"  (1.676 m)  Wt 223 lb 0.6 oz (101.17 kg)  BMI 36.02 kg/m2  SpO2 97% Pleasant well nourished female, alert and oriented x 3, in no cardio-pulmonary distress. Afebrile. HEENT No facial trauma or asymetry. Sinuses non tender.  EOMI, PERTL,  External ears normal, tympanic membranes clear. Oropharynx moist, no exudate, poor  dentition. Neck: supple, no adenopathy,JVD or thyromegaly.No bruits.  Chest: Clear to ascultation bilaterally.No crackles or wheezes.Decreased though adequate air entry Non tender to palpation  Breast: No asymetry,no masses or lumps. No tenderness. No nipple discharge or inversion. No axillary or supraclavicular adenopathy  Cardiovascular system; Heart sounds normal,  S1 and  S2 ,no S3.  No murmur, or thrill. Apical beat not displaced Peripheral pulses normal.  Abdomen: Soft, non tender, no organomegaly or masses. No bruits. Bowel sounds normal. No guarding, tenderness or rebound.  Rectal:  Normal sphincter tone. No mass.No rectal masses.  Guaiac negative stool.  GU: External genitalia normal female genitalia , female distribution of hair. No lesions. Urethral meatus normal in size, no  Prolapse, no lesions visibly  Present. Bladder non tender. Vagina pink and moist , with no visible lesions , discharge present . Adequate pelvic support no  cystocele or rectocele noted Uterus absent , no adnexal masses, no r adnexal  tenderness.   Musculoskeletal exam: Adequate , though reduced ROM of spine, hips , shoulders and knees.  No muscle wasting or atrophy.   Neurologic: Cranial nerves 2 to 12 intact. Power, tone ,sensation and reflexes normal throughout. No disturbance in gait. No tremor.  Skin: Tender , warm erythematous lesion on left arm, max diameter approx 4 inches Pigmentation normal throughout  Psych; Normal mood and affect. Judgement and concentration normal        Assessment & Plan:  Encounter for annual health examination Annual exam as documented. Counseling done  re healthy lifestyle involving commitment to 150 minutes exercise per week, heart healthy diet, and attaining healthy weight.The importance of adequate sleep also discussed. Regular seat belt use and safe storage  of firearms if patient has them, is also discussed. Changes in health habits are decided on by the patient with goals and time frames  set for achieving them. Immunization and cancer screening needs are specifically addressed at this visit.   Cellulitis and abscess of upper arm and forearm Left arm redness and swelling x 1 day, warm to touch, possible insect bite, keflex x 5 days  HYPERTENSION Uncontrolled with systolic elevated, no med change , lifestyle only, re eval in 3 month

## 2014-02-24 NOTE — Assessment & Plan Note (Addendum)
Left arm redness and swelling x 1 day, warm to touch, possible insect bite, keflex x 5 days

## 2014-02-24 NOTE — Assessment & Plan Note (Signed)
Uncontrolled with systolic elevated, no med change , lifestyle only, re eval in 3 month

## 2014-02-24 NOTE — Assessment & Plan Note (Signed)
Presented to ED with ellevated BP, now controlled, continue medication as before

## 2014-02-25 LAB — COMPLETE METABOLIC PANEL WITH GFR
ALBUMIN: 3.9 g/dL (ref 3.5–5.2)
ALK PHOS: 85 U/L (ref 39–117)
ALT: 8 U/L (ref 0–35)
AST: 13 U/L (ref 0–37)
BILIRUBIN TOTAL: 0.6 mg/dL (ref 0.2–1.2)
BUN: 12 mg/dL (ref 6–23)
CO2: 27 mEq/L (ref 19–32)
Calcium: 9 mg/dL (ref 8.4–10.5)
Chloride: 102 mEq/L (ref 96–112)
Creat: 0.8 mg/dL (ref 0.50–1.10)
GFR, Est African American: 86 mL/min
GFR, Est Non African American: 74 mL/min
Glucose, Bld: 125 mg/dL — ABNORMAL HIGH (ref 70–99)
POTASSIUM: 4.3 meq/L (ref 3.5–5.3)
SODIUM: 141 meq/L (ref 135–145)
TOTAL PROTEIN: 6.8 g/dL (ref 6.0–8.3)

## 2014-02-25 LAB — LIPID PANEL
CHOL/HDL RATIO: 3.8 ratio
Cholesterol: 173 mg/dL (ref 0–200)
HDL: 45 mg/dL (ref >39)
LDL Cholesterol: 103 mg/dL — ABNORMAL HIGH (ref 0–99)
Triglycerides: 123 mg/dL (ref <150)
VLDL: 25 mg/dL (ref 0–40)

## 2014-02-25 LAB — HEMOGLOBIN A1C
Hgb A1c MFr Bld: 6.5 % — ABNORMAL HIGH (ref <5.7)
Mean Plasma Glucose: 140 mg/dL — ABNORMAL HIGH (ref <117)

## 2014-02-25 LAB — CYTOLOGY - PAP

## 2014-02-26 NOTE — Addendum Note (Signed)
Addended by: Eual Fines on: 02/26/2014 01:19 PM   Modules accepted: Orders

## 2014-03-23 ENCOUNTER — Encounter: Payer: Self-pay | Admitting: Gastroenterology

## 2014-03-23 ENCOUNTER — Ambulatory Visit (INDEPENDENT_AMBULATORY_CARE_PROVIDER_SITE_OTHER): Payer: Medicare Other | Admitting: Cardiovascular Disease

## 2014-03-23 ENCOUNTER — Encounter (INDEPENDENT_AMBULATORY_CARE_PROVIDER_SITE_OTHER): Payer: Self-pay

## 2014-03-23 ENCOUNTER — Ambulatory Visit (INDEPENDENT_AMBULATORY_CARE_PROVIDER_SITE_OTHER): Payer: Medicare Other | Admitting: Gastroenterology

## 2014-03-23 ENCOUNTER — Encounter: Payer: Self-pay | Admitting: Cardiovascular Disease

## 2014-03-23 VITALS — BP 142/86 | HR 90 | Temp 98.2°F | Ht 66.0 in | Wt 223.0 lb

## 2014-03-23 VITALS — BP 128/92 | HR 94 | Ht 66.0 in | Wt 221.0 lb

## 2014-03-23 DIAGNOSIS — Z716 Tobacco abuse counseling: Secondary | ICD-10-CM

## 2014-03-23 DIAGNOSIS — F17200 Nicotine dependence, unspecified, uncomplicated: Secondary | ICD-10-CM

## 2014-03-23 DIAGNOSIS — Z7189 Other specified counseling: Secondary | ICD-10-CM

## 2014-03-23 DIAGNOSIS — R0602 Shortness of breath: Secondary | ICD-10-CM

## 2014-03-23 DIAGNOSIS — R072 Precordial pain: Secondary | ICD-10-CM

## 2014-03-23 DIAGNOSIS — I2510 Atherosclerotic heart disease of native coronary artery without angina pectoris: Secondary | ICD-10-CM

## 2014-03-23 DIAGNOSIS — I10 Essential (primary) hypertension: Secondary | ICD-10-CM

## 2014-03-23 DIAGNOSIS — K259 Gastric ulcer, unspecified as acute or chronic, without hemorrhage or perforation: Secondary | ICD-10-CM

## 2014-03-23 DIAGNOSIS — E785 Hyperlipidemia, unspecified: Secondary | ICD-10-CM

## 2014-03-23 NOTE — Progress Notes (Signed)
Primary Care Physician: [name], MD  Primary Gastroenterologist:  Barney Drain, MD   Chief Complaint  Patient presents with  . Follow-up    HPI: [name] is a 73 y.o. female here for followup visit. Last seen in February 2015 for dysphagia and abnormal barium esophagram. 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 2 small irregular-shaped ulcers with edema in the gastric antrum, mild erosive gastritis. Biopsies benign. Recommended to take pantoprazole 40 mg twice a day and followup for repeat endoscopy in 3 months.  States that she has been doing well. Denies dysphagia. Has a good appetite. Admits that she's not been taking the majority of her medication on regular basis. Keeps forgetting after she hides her medications from her grandchildren. Has not been on pen have resolved and while. She had a CT scan in July after she developed abdominal discomfort while pulling a trash can. She felt a golf ball sized area , but states by the time EMS had gotten there she felt much better. CT scan showed no signs of abdominal mass or hernia. Denies melena or rectal bleeding. Uses Hardin Negus' milk of magnesia as needed for constipation. Previously unable to afford Linzess.   Takes a baby aspirin daily but no other NSAIDs.  She saw cardiologist today and plans on having an echo/stress test later this week.  Current Outpatient Prescriptions  Medication Sig Dispense Refill  . ADVAIR DISKUS 100-50 MCG/DOSE AEPB Inhale 1 puff into the lungs 2 (two) times daily.       Marland Kitchen aspirin EC 81 MG tablet Take 81 mg by mouth daily.      . calcium-vitamin D (OSCAL WITH D) 500-200 MG-UNIT per tablet Take 1 tablet by mouth 2 (two) times daily.  180 tablet  3  . fluticasone (FLONASE) 50 MCG/ACT nasal spray Place 2 sprays into both nostrils daily.  16 g  3  . hydrochlorothiazide (HYDRODIURIL) 25 MG tablet TAKE ONE TABLET BY MOUTH EVERY DAY  30 tablet  4  . pravastatin  (PRAVACHOL) 40 MG tablet TAKE 1 TABLET BY MOUTH EVERY EVENING  30 tablet  3  . simethicone (MYLICON) 80 MG chewable tablet Chew 80 mg by mouth every 6 (six) hours as needed for flatulence.       No current facility-administered medications for this visit.    Allergies as of 03/23/2014  . (No Known Allergies)   Past Medical History  Diagnosis Date  . Sinusitis   . OA (osteoarthritis) of knee   . Nicotine addiction   . COPD (chronic obstructive pulmonary disease)   . Obesity   . Hypertension   . Lung nodule seen on imaging study    Past Surgical History  Procedure Laterality Date  . Knee arthroscopy right    . Cholecystectomy    . Total knee arthroplasty right  05/29/2005    Dr. Aline Brochure   . Bilateral cataract surgery  7/27 & 03/01/09    Dr. Gershon Crane    . Partial hysterectomy    . Esophagogastroduodenoscopy  11/07/2010    Jenkins:hiatal hernia/no evidence of Barrett esophagitis.  The Z-line was noted to be at 39 cm from the teeth. CLO test negative  . Colonoscopy  04/2004    Dr. Leane Para Smith-->melanosis coli  . Colonoscopy with esophagogastroduodenoscopy (egd) N/A 02/25/2013    Dr. Fields:Normal mucosa in the terminal ileum/Severe melanosis throughout the entire examined colon/ONE COLON POLYP REMOVED (tubular adenoma)/Mild diverticulosis  in the sigmoid colon/EGD:The mucosa  of the esophagus appeared normal/Non-erosive gastritis, empiric dilation with Savary dilator  . Savory dilation N/A 02/25/2013    Procedure: SAVORY DILATION;  Surgeon: Danie Binder, MD;  Location: [facility];  Service: Endoscopy;  Laterality: N/A;  Venia Minks dilation N/A 02/25/2013    Procedure: Venia Minks DILATION;  Surgeon: Danie Binder, MD;  Location: [facility];  Service: Endoscopy;  Laterality: N/A;  . 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 bx=focal erosion with inflammation and fibrosis   Family History    Problem Relation Age of Onset  . Diabetes Mother   . Hypertension Mother   . Coronary artery disease Mother   . Pneumonia Father   . Hypertension Sister   . Hypertension Sister   . Hypertension Brother   . Hypertension Brother   . Colon cancer Neg Hx   . Liver disease Neg Hx    History   Social History  . Marital Status: Widowed    Spouse Name: N/A    Number of Children: 4  . Years of Education: N/A   Occupational History  . employed     Social History Main Topics  . Smoking status: Current Some Day Smoker -- 10 years    Types: Cigarettes  . Smokeless tobacco: Never Used     Comment: smokes 1 or 2 per day  . Alcohol Use: No  . Drug Use: No  . Sexual Activity: None   Other Topics Concern  . None   Social History Narrative  . None    ROS:  General: Negative for anorexia, weight loss, fever, chills, fatigue, weakness. ENT: Negative for hoarseness, difficulty swallowing , nasal congestion. CV: Negative for chest pain, angina, palpitations, dyspnea on exertion, peripheral edema.  Respiratory: Negative for dyspnea at rest, dyspnea on exertion, cough, sputum, wheezing.  GI: See history of present illness. GU:  Negative for dysuria, hematuria, urinary incontinence, urinary frequency, nocturnal urination.  Endo: Negative for unusual weight change.    Physical Examination:   BP 142/86  Pulse 90  Temp(Src) 98.2 F (36.8 C) (Oral)  Ht 5\' 6"  (1.676 m)  Wt 223 lb (101.152 kg)  BMI 36.01 kg/m2  General: Well-nourished, well-developed in no acute distress.  Eyes: No icterus. Mouth: Oropharyngeal mucosa moist and pink , no lesions erythema or exudate. Lungs: Clear to auscultation bilaterally.  Heart: Regular rate and rhythm, no murmurs rubs or gallops.  Abdomen: Bowel sounds are normal, nontender, nondistended, no hepatosplenomegaly or masses, no abdominal bruits or hernia , no rebound or guarding.   Extremities: No lower extremity edema. No clubbing or  deformities. Neuro: Alert and oriented x 4   Skin: Warm and dry, no jaundice.   Psych: Alert and cooperative, normal mood and affect.   Imaging Studies:  CT A/P with CM 02/19/14  IMPRESSION:  1. No acute findings in the abdomen or pelvis.  2. No abdominal wall hernia or significant hiatal hernia noted.  3. Status post cholecystectomy.  4. Atherosclerosis, including 3 vessel coronary artery disease.  Assessment for potential risk factor modification, dietary therapy  or pharmacologic therapy may be warranted, if clinically indicated.  5. Left-sided pulmonary nodules are completely unchanged compared to  prior study 07/10/2011, and can be considered benign requiring no  future imaging followup.  6. Additional incidental findings, as above, similar prior studies

## 2014-03-23 NOTE — Assessment & Plan Note (Signed)
73 year old lady with history of gastric ulcers at time of EGD in February 2015 who presents for followup. Denies any specific GI symptoms. Did not take pantoprazole on regular basis. Due for followup EGD to verify ulcer healing at this time. Given she is in the midst of a cardiac evaluation, we will wait for these results become available prior to scheduling.

## 2014-03-23 NOTE — Patient Instructions (Signed)
Your physician recommends that you schedule a follow-up appointment in: 4-6 weeks     Your physician has requested that you have an echocardiogram. Echocardiography is a painless test that uses sound waves to create images of your heart. It provides your doctor with information about the size and shape of your heart and how well your heart's chambers and valves are working. This procedure takes approximately one hour. There are no restrictions for this procedure.   Your physician has requested that you have a lexiscan myoview. For further information please visit [email or web address]. Please follow instruction sheet, as given.       Thank you for choosing [facility] !

## 2014-03-23 NOTE — Patient Instructions (Signed)
1. Once you have completed your heart test, we will schedule you for an upper endoscopy to make sure your ulcer has healed.

## 2014-03-23 NOTE — Progress Notes (Signed)
Patient ID: [name], female   DOB: May 16, 1941, 73 y.o.   MRN: [id number]       CARDIOLOGY CONSULT NOTE  Patient ID: [name] MRN: [id number] DOB/AGE: 08-23-1940 73 y.o.  Admit date: (Not on file) Primary Physician [name], MD  Reason for Consultation: chest pain  HPI: The patient is a 73 year old woman with a history of essential hypertension, hyperlipidemia, tobacco abuse and COPD who has been referred for the evaluation of chest pain. She tells me that whenever she lifts something heavy she feels a tightness in the retrosternal region. It is accompanied by shortness of breath. She said that heart disease runs on her mother's side of the family with several experiencing heart attacks and strokes. She said her brother has a pacemaker. She also describes symptoms of sinus congestion. She has a long history of tobacco abuse but is trying to quit. Her symptoms appear to be exertional. She denies leg swelling. She does describe a hiatal hernia.  A recent CT scan of the chest demonstrated atherosclerosis with three-vessel coronary artery disease.  ECG on 02/02/2014 demonstrated normal sinus rhythm with no ischemic ST segment or T-wave abnormalities.  Soc: Widowed. Long h/o tobacco abuse but trying to quit.  No Known Allergies  Current Outpatient Prescriptions  Medication Sig Dispense Refill  . ADVAIR DISKUS 100-50 MCG/DOSE AEPB Inhale 1 puff into the lungs 2 (two) times daily.       Marland Kitchen aspirin EC 81 MG tablet Take 81 mg by mouth daily.      . calcium-vitamin D (OSCAL WITH D) 500-200 MG-UNIT per tablet Take 1 tablet by mouth 2 (two) times daily.  180 tablet  3  . fluticasone (FLONASE) 50 MCG/ACT nasal spray Place 2 sprays into both nostrils daily.  16 g  3  . hydrochlorothiazide (HYDRODIURIL) 25 MG tablet TAKE ONE TABLET BY MOUTH EVERY DAY  30 tablet  4  . pravastatin (PRAVACHOL) 40 MG tablet TAKE 1 TABLET BY MOUTH EVERY EVENING  30 tablet  3  . simethicone (MYLICON)  80 MG chewable tablet Chew 80 mg by mouth every 6 (six) hours as needed for flatulence.       No current facility-administered medications for this visit.    Past Medical History  Diagnosis Date  . Sinusitis   . OA (osteoarthritis) of knee   . Nicotine addiction   . COPD (chronic obstructive pulmonary disease)   . Obesity   . Hypertension   . Lung nodule seen on imaging study     Past Surgical History  Procedure Laterality Date  . Knee arthroscopy right    . Cholecystectomy    . Total knee arthroplasty right  05/29/2005    Dr. Aline Brochure   . Bilateral cataract surgery  7/27 & 03/01/09    Dr. Gershon Crane    . Partial hysterectomy    . Esophagogastroduodenoscopy  11/07/2010    Jenkins:hiatal hernia/no evidence of Barrett esophagitis.  The Z-line was noted to be at 39 cm from the teeth. CLO test negative  . Colonoscopy  04/2004    Dr. Leane Para Smith-->melanosis coli  . Colonoscopy with esophagogastroduodenoscopy (egd) N/A 02/25/2013    Dr. Fields:Normal mucosa in the terminal ileum/Severe melanosis throughout the entire examined colon/ONE COLON POLYP REMOVED (tubular adenoma)/Mild diverticulosis  in the sigmoid colon/EGD:The mucosa of the esophagus appeared normal/Non-erosive gastritis, empiric dilation with Savary dilator  . Savory dilation N/A 02/25/2013    Procedure: SAVORY DILATION;  Surgeon: Danie Binder, MD;  Location: [facility];  Service: Endoscopy;  Laterality: N/A;  Venia Minks dilation N/A 02/25/2013    Procedure: Venia Minks DILATION;  Surgeon: Danie Binder, MD;  Location: [facility];  Service: Endoscopy;  Laterality: N/A;  . 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 bx=focal erosion with inflammation and fibrosis    History   Social History  . Marital Status: Widowed    Spouse Name: N/A    Number of Children: 4  . Years of Education: N/A   Occupational History  . employed      Social History Main Topics  . Smoking status: Current Some Day Smoker -- 10 years    Types: Cigarettes  . Smokeless tobacco: Never Used     Comment: smokes 1 or 2 per day  . Alcohol Use: No  . Drug Use: No  . Sexual Activity: Not on file   Other Topics Concern  . Not on file   Social History Narrative  . No narrative on file     Fam: Strong h/o cardiovascular disease on mother's side of family.  Prior to Admission medications   Medication Sig Start Date End Date Taking? Authorizing Provider  ADVAIR DISKUS 100-50 MCG/DOSE AEPB Inhale 1 puff into the lungs 2 (two) times daily.  02/20/13  Yes Historical Provider, MD  aspirin EC 81 MG tablet Take 81 mg by mouth daily.   Yes Historical Provider, MD  calcium-vitamin D (OSCAL WITH D) 500-200 MG-UNIT per tablet Take 1 tablet by mouth 2 (two) times daily. 05/20/12  Yes Fayrene Helper, MD  fluticasone (FLONASE) 50 MCG/ACT nasal spray Place 2 sprays into both nostrils daily. 08/06/13 08/06/14 Yes Fayrene Helper, MD  hydrochlorothiazide (HYDRODIURIL) 25 MG tablet TAKE ONE TABLET BY MOUTH EVERY DAY 08/06/13  Yes Fayrene Helper, MD  pravastatin (PRAVACHOL) 40 MG tablet TAKE 1 TABLET BY MOUTH EVERY EVENING   Yes Fayrene Helper, MD  simethicone (MYLICON) 80 MG chewable tablet Chew 80 mg by mouth every 6 (six) hours as needed for flatulence.   Yes Historical Provider, MD     Review of systems complete and found to be negative unless listed above in HPI     Physical exam Blood pressure 128/92, pulse 94, height 5\' 6"  (1.676 m), weight 221 lb (100.245 kg), SpO2 98.00%. General: NAD, obese Neck: No JVD, no thyromegaly or thyroid nodule.  Lungs: Clear to auscultation bilaterally with normal respiratory effort. CV: Nondisplaced PMI. Regular rate and rhythm, normal S1/S2, no S3/S4, no murmur.  No peripheral edema.  No carotid bruit.  Normal pedal pulses.  Abdomen: Soft, nontender, no hepatosplenomegaly, no distention.  Skin: Intact  without lesions or rashes.  Neurologic: Alert and oriented x 3.  Psych: Normal affect. Extremities: No clubbing or cyanosis.  HEENT: Normal.   ECG: Most recent ECG reviewed.  Labs:   Lab Results  Component Value Date   WBC 7.2 02/02/2014   HGB 12.4 02/02/2014   HCT 36.0 02/02/2014   MCV 85.7 02/02/2014   PLT 252 02/02/2014   No results found for this basename: NA, K, CL, CO2, BUN, CREATININE, CALCIUM, LABALBU, PROT, BILITOT, ALKPHOS, ALT, AST, GLUCOSE,  in the last 168 hours Lab Results  Component Value Date   TROPONINI <0.30 02/02/2014    Lab Results  Component Value Date   CHOL 173 02/23/2014   CHOL 185 10/19/2013   CHOL 230* 10/13/2012   Lab Results  Component Value  Date   HDL 45 02/23/2014   HDL 47 10/19/2013   HDL 57 10/13/2012   Lab Results  Component Value Date   LDLCALC 103* 02/23/2014   LDLCALC 114* 10/19/2013   LDLCALC 146* 10/13/2012   Lab Results  Component Value Date   TRIG 123 02/23/2014   TRIG 119 10/19/2013   TRIG 134 10/13/2012   Lab Results  Component Value Date   CHOLHDL 3.8 02/23/2014   CHOLHDL 3.9 10/19/2013   CHOLHDL 4.0 10/13/2012   No results found for this basename: LDLDIRECT         Studies: No results found.  ASSESSMENT AND PLAN:  1. Chest pain, shortness of breath in setting of abnormal CT scan demonstrating 3-vessel CAD: Given her symptoms and multiple cardiovascular risk factors including essential hypertension, hyperlipidemia and tobacco abuse as well as her strong family history of coronary artery disease, I'll proceed with an echocardiogram in [place] stress test to assess for ischemic heart disease. Continue aspirin 81 mg and pravastatin 40 mg.  2. Essential HTN: Mildly elevated diastolic blood pressure. For the time being, continue hydrochlorothiazide 25 mg daily.  3. Hyperlipidemia: On moderate intensity statin therapy with pravastatin 40 mg daily. Lipids on 02/23/2014 demonstrated total cholesterol 173, triglycerides 123, HDL  45, LDL 103.  4. Tobacco abuse: Cessation counseling provided.  Dispo: f/u 4-6 weeks.   Signed: Kate Sable, M.D., F.A.C.C.  03/23/2014, 9:36 AM

## 2014-03-24 NOTE — Progress Notes (Signed)
Cc to pcp °

## 2014-04-02 ENCOUNTER — Encounter (HOSPITAL_COMMUNITY)
Admission: RE | Admit: 2014-04-02 | Discharge: 2014-04-02 | Disposition: A | Discharge status: Home / Self Care | Payer: Medicare Other | Source: Ambulatory Visit | Attending: Cardiovascular Disease | Admitting: Cardiovascular Disease

## 2014-04-02 ENCOUNTER — Ambulatory Visit (HOSPITAL_COMMUNITY)
Admission: RE | Admit: 2014-04-02 | Discharge: 2014-04-02 | Disposition: A | Discharge status: Home / Self Care | Payer: Medicare Other | Source: Ambulatory Visit | Attending: Cardiovascular Disease | Admitting: Cardiovascular Disease

## 2014-04-02 ENCOUNTER — Encounter (HOSPITAL_COMMUNITY): Payer: Self-pay

## 2014-04-02 DIAGNOSIS — R072 Precordial pain: Secondary | ICD-10-CM

## 2014-04-02 DIAGNOSIS — R079 Chest pain, unspecified: Secondary | ICD-10-CM

## 2014-04-02 DIAGNOSIS — R0602 Shortness of breath: Secondary | ICD-10-CM

## 2014-04-02 DIAGNOSIS — I517 Cardiomegaly: Secondary | ICD-10-CM

## 2014-04-02 DIAGNOSIS — I079 Rheumatic tricuspid valve disease, unspecified: Secondary | ICD-10-CM | POA: Insufficient documentation

## 2014-04-02 DIAGNOSIS — R0609 Other forms of dyspnea: Secondary | ICD-10-CM | POA: Diagnosis not present

## 2014-04-02 DIAGNOSIS — I080 Rheumatic disorders of both mitral and aortic valves: Secondary | ICD-10-CM | POA: Diagnosis not present

## 2014-04-02 DIAGNOSIS — R0989 Other specified symptoms and signs involving the circulatory and respiratory systems: Secondary | ICD-10-CM | POA: Insufficient documentation

## 2014-04-02 MED ORDER — REGADENOSON 0.4 MG/5ML IV SOLN
INTRAVENOUS | Status: AC
  Administered 2014-04-02: 0.4 mg via INTRAVENOUS
  Filled 2014-04-02: qty 5

## 2014-04-02 MED ORDER — REGADENOSON 0.4 MG/5ML IV SOLN
0.4000 mg | Freq: Once | INTRAVENOUS | Status: AC | PRN
  Administered 2014-04-02: 0.4 mg via INTRAVENOUS

## 2014-04-02 MED ORDER — SODIUM CHLORIDE 0.9 % IJ SOLN
10.0000 mL | INTRAMUSCULAR | Status: DC | PRN
  Administered 2014-04-02: 10 mL via INTRAVENOUS

## 2014-04-02 MED ORDER — TECHNETIUM TC 99M SESTAMIBI GENERIC - CARDIOLITE
30.0000 | Freq: Once | INTRAVENOUS | Status: AC | PRN
  Administered 2014-04-02: 30 via INTRAVENOUS

## 2014-04-02 MED ORDER — TECHNETIUM TC 99M SESTAMIBI - CARDIOLITE
10.0000 | Freq: Once | INTRAVENOUS | Status: AC | PRN
  Administered 2014-04-02: 10:00:00 10 via INTRAVENOUS

## 2014-04-02 MED ORDER — SODIUM CHLORIDE 0.9 % IJ SOLN
INTRAMUSCULAR | Status: AC
  Administered 2014-04-02: 10 mL via INTRAVENOUS
  Filled 2014-04-02: qty 10

## 2014-04-02 NOTE — Progress Notes (Signed)
  Echocardiogram 2D Echocardiogram has been performed.  [place], [place] 04/02/2014, 9:16 AM

## 2014-04-02 NOTE — Progress Notes (Signed)
Stress Lab Nurses Notes - [place] 04/02/2014 Reason for doing test: Chest Pain and Dyspnea Type of test: Wille Glaser Nurse performing test: Gerrit Halls, RN Nuclear Medicine Tech: Melburn Hake Echo Tech: Not Applicable MD performing test: S. McDowell/K.Purcell Nails NP      Family MD: Moshe Cipro  Test explained and consent signed: Yes.   IV started: Saline lock flushed, No redness or edema and Saline lock started in radiology Symptoms: SOB Treatment/Intervention: None Reason test stopped: protocol completed After recovery IV was: Discontinued via X-ray tech and No redness or edema Patient to return to Nuc. Med at : 12:30 Patient discharged: Home Patient's Condition upon discharge was: stable Comments: During test BP 131/76 & HR 102.  Recovery BP 126/83 & HR 93.  Symptoms resolved in recovery.  Geanie Cooley T

## 2014-04-05 ENCOUNTER — Encounter: Payer: Self-pay | Admitting: Cardiovascular Disease

## 2014-04-05 ENCOUNTER — Other Ambulatory Visit: Payer: Self-pay | Admitting: Cardiovascular Disease

## 2014-04-05 ENCOUNTER — Ambulatory Visit (INDEPENDENT_AMBULATORY_CARE_PROVIDER_SITE_OTHER): Payer: Medicare Other | Admitting: Cardiovascular Disease

## 2014-04-05 VITALS — BP 118/82 | HR 96 | Ht 66.0 in | Wt 218.0 lb

## 2014-04-05 DIAGNOSIS — Z716 Tobacco abuse counseling: Secondary | ICD-10-CM

## 2014-04-05 DIAGNOSIS — R079 Chest pain, unspecified: Secondary | ICD-10-CM

## 2014-04-05 DIAGNOSIS — R0602 Shortness of breath: Secondary | ICD-10-CM

## 2014-04-05 DIAGNOSIS — R931 Abnormal findings on diagnostic imaging of heart and coronary circulation: Secondary | ICD-10-CM

## 2014-04-05 DIAGNOSIS — E785 Hyperlipidemia, unspecified: Secondary | ICD-10-CM

## 2014-04-05 DIAGNOSIS — F17200 Nicotine dependence, unspecified, uncomplicated: Secondary | ICD-10-CM

## 2014-04-05 DIAGNOSIS — I2510 Atherosclerotic heart disease of native coronary artery without angina pectoris: Secondary | ICD-10-CM

## 2014-04-05 DIAGNOSIS — R072 Precordial pain: Secondary | ICD-10-CM

## 2014-04-05 DIAGNOSIS — Z7189 Other specified counseling: Secondary | ICD-10-CM

## 2014-04-05 DIAGNOSIS — R9439 Abnormal result of other cardiovascular function study: Secondary | ICD-10-CM

## 2014-04-05 DIAGNOSIS — I10 Essential (primary) hypertension: Secondary | ICD-10-CM

## 2014-04-05 NOTE — Progress Notes (Signed)
Patient ID: [name], female   DOB: 1941/01/06, 73 y.o.   MRN: [id number]      SUBJECTIVE: Pt returns for follow up on CV testing performed for the evaluation of chest pain. Nuclear stress testing demonstrated a moderate sized area of anterior wall ischemia. Echocardiography demonstrated normal LV systolic function, EF 69-48%, moderate LVH and grade I diastolic dysfunction. She denies any further episodes of chest tightness, and also wonders if some of her symptoms relate to her hiatal hernia.   Review of Systems: As per "subjective", otherwise negative.  No Known Allergies  Current Outpatient Prescriptions  Medication Sig Dispense Refill  . ADVAIR DISKUS 100-50 MCG/DOSE AEPB Inhale 1 puff into the lungs 2 (two) times daily.       Marland Kitchen aspirin EC 81 MG tablet Take 81 mg by mouth daily.      . calcium-vitamin D (OSCAL WITH D) 500-200 MG-UNIT per tablet Take 1 tablet by mouth 2 (two) times daily.  180 tablet  3  . fluticasone (FLONASE) 50 MCG/ACT nasal spray Place 2 sprays into both nostrils daily.  16 g  3  . hydrochlorothiazide (HYDRODIURIL) 25 MG tablet TAKE ONE TABLET BY MOUTH EVERY DAY  30 tablet  4  . pravastatin (PRAVACHOL) 40 MG tablet TAKE 1 TABLET BY MOUTH EVERY EVENING  30 tablet  3  . simethicone (MYLICON) 80 MG chewable tablet Chew 80 mg by mouth every 6 (six) hours as needed for flatulence.       No current facility-administered medications for this visit.    Past Medical History  Diagnosis Date  . Sinusitis   . OA (osteoarthritis) of knee   . Nicotine addiction   . COPD (chronic obstructive pulmonary disease)   . Obesity   . Hypertension   . Lung nodule seen on imaging study     Past Surgical History  Procedure Laterality Date  . Knee arthroscopy right    . Cholecystectomy    . Total knee arthroplasty right  05/29/2005    Dr. Aline Brochure   . Bilateral cataract surgery  7/27 & 03/01/09    Dr. Gershon Crane    . Partial hysterectomy    . Esophagogastroduodenoscopy   11/07/2010    Jenkins:hiatal hernia/no evidence of Barrett esophagitis.  The Z-line was noted to be at 39 cm from the teeth. CLO test negative  . Colonoscopy  04/2004    Dr. Leane Para Smith-->melanosis coli  . Colonoscopy with esophagogastroduodenoscopy (egd) N/A 02/25/2013    Dr. Fields:Normal mucosa in the terminal ileum/Severe melanosis throughout the entire examined colon/ONE COLON POLYP REMOVED (tubular adenoma)/Mild diverticulosis  in the sigmoid colon/EGD:The mucosa of the esophagus appeared normal/Non-erosive gastritis, empiric dilation with Savary dilator  . Savory dilation N/A 02/25/2013    Procedure: SAVORY DILATION;  Surgeon: Danie Binder, MD;  [facility];  Service: Endoscopy;  Laterality: N/A;  Venia Minks dilation N/A 02/25/2013    Procedure: Venia Minks DILATION;  Surgeon: Danie Binder, MD;  Location: [facility];  Service: Endoscopy;  Laterality: N/A;  . 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 bx=focal erosion with inflammation and fibrosis    History   Social History  . Marital Status: Widowed    Spouse Name: N/A    Number of Children: 4  . Years of Education: N/A   Occupational History  . employed     Social History Main Topics  . Smoking status: Current Some Day  Smoker -- 10 years    Types: Cigarettes  . Smokeless tobacco: Never Used     Comment: smokes 1 or 2 per day  . Alcohol Use: No  . Drug Use: No  . Sexual Activity: Not on file   Other Topics Concern  . Not on file   Social History Narrative  . No narrative on file    BP 118/82 Pulse 96 Resp Temp Temp src SpO2 97% Weight 218 lb (98.884 kg) Height 5\' 6"  (1.676 m)    PHYSICAL EXAM General: NAD HEENT: Normal. Neck: No JVD, no thyromegaly. Lungs: Clear to auscultation bilaterally with normal respiratory effort. CV: Nondisplaced PMI.  Regular rate and rhythm, normal S1/S2, no S3/S4, no murmur. No  pretibial or periankle edema.  No carotid bruit.  Normal pedal pulses.  Abdomen: Soft, nontender, no hepatosplenomegaly, no distention.  Neurologic: Alert and oriented x 3.  Psych: Normal affect. Skin: Normal. Musculoskeletal: Normal range of motion, no gross deformities. Extremities: No clubbing or cyanosis.   ECG: Most recent ECG reviewed.      ASSESSMENT AND PLAN: 1. Chest pain, shortness of breath in setting of abnormal nuclear stress test with anterior wall ischemia and abnormal CT scan demonstrating 3-vessel CAD: Given her nuclear MPI study findings, symptoms and multiple cardiovascular risk factors including essential hypertension, hyperlipidemia and tobacco abuse as well as her strong family history of coronary artery disease, I will proceed with coronary angiography. Continue aspirin 81 mg and pravastatin 40 mg.  2. Essential HTN: Controlled today. Continue hydrochlorothiazide 25 mg daily.  3. Hyperlipidemia: On moderate intensity statin therapy with pravastatin 40 mg daily. Lipids on 02/23/2014 demonstrated total cholesterol 173, triglycerides 123, HDL 45, LDL 103. I will likely switch to Lipitor 40 mg daily at a minimum, but will await the results of cardiac catheterization. 4. Tobacco abuse: Cessation counseling previously provided.   Dispo: f/u after cath.   Kate Sable, M.D., F.A.C.C.

## 2014-04-05 NOTE — Patient Instructions (Signed)
Your physician recommends that you schedule a follow-up appointment in: after heart cath     Your physician recommends that you continue on your current medications as directed. Please refer to the Current Medication list given to you today.      Your physician has requested that you have a cardiac catheterization. Cardiac catheterization is used to diagnose and/or treat various heart conditions. Doctors may recommend this procedure for a number of different reasons. The most common reason is to evaluate chest pain. Chest pain can be a symptom of coronary artery disease (CAD), and cardiac catheterization can show whether plaque is narrowing or blocking your heart's arteries. This procedure is also used to evaluate the valves, as well as measure the blood flow and oxygen levels in different parts of your heart. For further information please visit [email or web address]. Please follow instruction sheet, as given.        Thank you for choosing [facility] !

## 2014-04-08 ENCOUNTER — Ambulatory Visit (HOSPITAL_COMMUNITY)
Admission: RE | Admit: 2014-04-08 | Discharge: 2014-04-08 | Disposition: A | Discharge status: Home / Self Care | Payer: Medicare Other | Source: Ambulatory Visit | Attending: Cardiovascular Disease | Admitting: Cardiovascular Disease

## 2014-04-08 ENCOUNTER — Encounter (HOSPITAL_COMMUNITY)
Admission: RE | Disposition: A | Discharge status: Home / Self Care | Payer: Self-pay | Source: Ambulatory Visit | Attending: Cardiovascular Disease

## 2014-04-08 DIAGNOSIS — I10 Essential (primary) hypertension: Secondary | ICD-10-CM | POA: Diagnosis not present

## 2014-04-08 DIAGNOSIS — R079 Chest pain, unspecified: Secondary | ICD-10-CM

## 2014-04-08 DIAGNOSIS — Z96659 Presence of unspecified artificial knee joint: Secondary | ICD-10-CM | POA: Insufficient documentation

## 2014-04-08 DIAGNOSIS — K449 Diaphragmatic hernia without obstruction or gangrene: Secondary | ICD-10-CM | POA: Insufficient documentation

## 2014-04-08 DIAGNOSIS — J4489 Other specified chronic obstructive pulmonary disease: Secondary | ICD-10-CM | POA: Insufficient documentation

## 2014-04-08 DIAGNOSIS — R931 Abnormal findings on diagnostic imaging of heart and coronary circulation: Secondary | ICD-10-CM

## 2014-04-08 DIAGNOSIS — E785 Hyperlipidemia, unspecified: Secondary | ICD-10-CM | POA: Diagnosis not present

## 2014-04-08 DIAGNOSIS — Z7982 Long term (current) use of aspirin: Secondary | ICD-10-CM | POA: Insufficient documentation

## 2014-04-08 DIAGNOSIS — R911 Solitary pulmonary nodule: Secondary | ICD-10-CM | POA: Insufficient documentation

## 2014-04-08 DIAGNOSIS — I2510 Atherosclerotic heart disease of native coronary artery without angina pectoris: Secondary | ICD-10-CM | POA: Insufficient documentation

## 2014-04-08 DIAGNOSIS — E669 Obesity, unspecified: Secondary | ICD-10-CM | POA: Diagnosis not present

## 2014-04-08 DIAGNOSIS — J449 Chronic obstructive pulmonary disease, unspecified: Secondary | ICD-10-CM | POA: Diagnosis not present

## 2014-04-08 DIAGNOSIS — R0602 Shortness of breath: Secondary | ICD-10-CM

## 2014-04-08 DIAGNOSIS — Z8249 Family history of ischemic heart disease and other diseases of the circulatory system: Secondary | ICD-10-CM | POA: Diagnosis not present

## 2014-04-08 DIAGNOSIS — F17200 Nicotine dependence, unspecified, uncomplicated: Secondary | ICD-10-CM | POA: Diagnosis not present

## 2014-04-08 HISTORY — PX: LEFT HEART CATHETERIZATION WITH CORONARY ANGIOGRAM: SHX5451

## 2014-04-08 LAB — BASIC METABOLIC PANEL
Anion gap: 17 — ABNORMAL HIGH (ref 5–15)
BUN: 9 mg/dL (ref 6–23)
CHLORIDE: 101 meq/L (ref 96–112)
CO2: 24 meq/L (ref 19–32)
Calcium: 8.9 mg/dL (ref 8.4–10.5)
Creatinine, Ser: 0.71 mg/dL (ref 0.50–1.10)
GFR calc Af Amer: >90 mL/min (ref >90)
GFR calc non Af Amer: 85 mL/min — ABNORMAL LOW (ref >90)
GLUCOSE: 117 mg/dL — AB (ref 70–99)
POTASSIUM: 4.1 meq/L (ref 3.7–5.3)
SODIUM: 142 meq/L (ref 137–147)

## 2014-04-08 LAB — PROTIME-INR
INR: 1.08 (ref 0.00–1.49)
Prothrombin Time: 14 seconds (ref 11.6–15.2)

## 2014-04-08 LAB — CBC
HEMATOCRIT: 39.2 % (ref 36.0–46.0)
HEMOGLOBIN: 13.5 g/dL (ref 12.0–15.0)
MCH: 29.7 pg (ref 26.0–34.0)
MCHC: 34.4 g/dL (ref 30.0–36.0)
MCV: 86.2 fL (ref 78.0–100.0)
Platelets: 285 10*3/uL (ref 150–400)
RBC: 4.55 MIL/uL (ref 3.87–5.11)
RDW: 14.4 % (ref 11.5–15.5)
WBC: 8.2 10*3/uL (ref 4.0–10.5)

## 2014-04-08 SURGERY — LEFT HEART CATHETERIZATION WITH CORONARY ANGIOGRAM
Anesthesia: LOCAL

## 2014-04-08 MED ORDER — SODIUM CHLORIDE 0.9 % IV SOLN: INTRAVENOUS | Status: DC

## 2014-04-08 MED ORDER — MIDAZOLAM HCL 2 MG/2ML IJ SOLN
INTRAMUSCULAR | Status: AC
  Filled 2014-04-08: qty 2

## 2014-04-08 MED ORDER — FENTANYL CITRATE 0.05 MG/ML IJ SOLN
INTRAMUSCULAR | Status: AC
  Filled 2014-04-08: qty 2

## 2014-04-08 MED ORDER — ASPIRIN 81 MG PO CHEW
CHEWABLE_TABLET | ORAL | Status: AC
  Filled 2014-04-08: qty 1

## 2014-04-08 MED ORDER — ACETAMINOPHEN 325 MG PO TABS: 650.0000 mg | ORAL_TABLET | ORAL | Status: DC | PRN

## 2014-04-08 MED ORDER — VERAPAMIL HCL 2.5 MG/ML IV SOLN
INTRAVENOUS | Status: AC
  Filled 2014-04-08: qty 2

## 2014-04-08 MED ORDER — SODIUM CHLORIDE 0.9 % IJ SOLN: 3.0000 mL | INTRAMUSCULAR | Status: DC | PRN

## 2014-04-08 MED ORDER — ONDANSETRON HCL 4 MG/2ML IJ SOLN: 4.0000 mg | Freq: Four times a day (QID) | INTRAMUSCULAR | Status: DC | PRN

## 2014-04-08 MED ORDER — SODIUM CHLORIDE 0.9 % IV SOLN: 250.0000 mL | INTRAVENOUS | Status: DC | PRN

## 2014-04-08 MED ORDER — ASPIRIN 81 MG PO CHEW: 81.0000 mg | CHEWABLE_TABLET | ORAL | Status: DC

## 2014-04-08 MED ORDER — SODIUM CHLORIDE 0.9 % IJ SOLN: 3.0000 mL | Freq: Two times a day (BID) | INTRAMUSCULAR | Status: DC

## 2014-04-08 MED ORDER — HEPARIN SODIUM (PORCINE) 1000 UNIT/ML IJ SOLN
INTRAMUSCULAR | Status: AC
  Filled 2014-04-08: qty 1

## 2014-04-08 NOTE — CV Procedure (Signed)
     Left Heart Catheterization with Coronary Angiography Report  [name]  73 y.o.  female 03-20-1941  Procedure Date: 04/08/2014 Referring Physiciaan:Suresh Bronson Ing, MD Primary Cardiologist:: [name], M.D.  INDICATIONS: Exertional chest discomfort, intermediate risk perfusion study, and daily cigarette smoker.  PROCEDURE: 1. Left heart catheterization; 2. Coronary angiography; 3. Left ventriculography  CONSENT:  The risks, benefits, and details of the procedure were explained in detail to the patient. Risks including death, stroke, heart attack, kidney injury, allergy, limb ischemia, bleeding and radiation injury were discussed.  The patient verbalized understanding and wanted to proceed.  Informed written consent was obtained.  PROCEDURE TECHNIQUE:  After Xylocaine anesthesia a 5 French Slender sheath was placed in the right radial artery with an angiocath and the modified Seldinger technique.  Coronary angiography was done using a 5 F JR 4 and JL 3.5 diagnostic catheters.  Left ventriculography was done using the JR 4 catheter and hand injection.   Images were reviewed and the case was  A wrist band was used to achieve hemostasis in the right radial.   CONTRAST:  Total of 70 cc.  COMPLICATIONS:  None   HEMODYNAMICS:  Aortic pressure 127/65 mmHg; LV pressure 129/11 mmHg; LVEDP 17 mm mercury  ANGIOGRAPHIC DATA:   The left main coronary artery is moderately calcified but widely patent..  The left anterior descending artery is large, transapical, and tortuous. First diagonal that has distribution of an obtuse marginal contains ostial 50-70% narrowing. The proximal mid and distal LAD is widely patent the second diagonal is widely patent.  The left circumflex artery is large, tortuous, and widely patent. 2 obtuse marginal branches arise from the circumflex and also widely patent.  The right coronary artery is dominant. There is eccentric 40% narrowing proximally.  The distal vessel is tortuous given a PDA and small left ventricular branches. No significant obstructions noted.   LEFT VENTRICULOGRAM:  Left ventricular angiogram was done in the 30 RAO projection and revealed hypertrophied papillary muscles. Normal cavity size. Low normal contractility with an estimated EF of 50-60%.   IMPRESSIONS:  1. 50-70% first diagonal and 40-50% proximal RCA. No significant obstruction is noted in the LAD or circumflex. The right coronary is also widely patent other than as mentioned. 2. Apparent hypertrophy of the left ventricle with low normal systolic function and abnormal filling pressures. EF is 50-55%. 3. Moderate left coronary calcification.   RECOMMENDATION:  No explanation for the abnormality noted on nuclear imaging. Perhaps the finding represents an attenuation artifact. The diagonal supplies the mid lateral wall based upon the angiographic images. The ostial stenosis is moderate and should be treated with medical therapy/risk factor modification.

## 2014-04-08 NOTE — Discharge Instructions (Signed)
Radial Site Care °Refer to this sheet in the next few weeks. These instructions provide you with information on caring for yourself after your procedure. Your caregiver may also give you more specific instructions. Your treatment has been planned according to current medical practices, but problems sometimes occur. Call your caregiver if you have any problems or questions after your procedure. °HOME CARE INSTRUCTIONS °· You may shower the day after the procedure. Remove the bandage (dressing) and gently wash the site with plain soap and water. Gently pat the site dry. °· Do not apply powder or lotion to the site. °· Do not submerge the affected site in water for 3 to 5 days. °· Inspect the site at least twice daily. °· Do not flex or bend the affected arm for 24 hours. °· No lifting over 5 pounds (2.3 kg) for 5 days after your procedure. °· Do not drive home if you are discharged the same day of the procedure. Have someone else drive you. °· You may drive 24 hours after the procedure unless otherwise instructed by your caregiver. °· Do not operate machinery or power tools for 24 hours. °· A responsible adult should be with you for the first 24 hours after you arrive home. °What to expect: °· Any bruising will usually fade within 1 to 2 weeks. °· Blood that collects in the tissue (hematoma) may be painful to the touch. It should usually decrease in size and tenderness within 1 to 2 weeks. °SEEK IMMEDIATE MEDICAL CARE IF: °· You have unusual pain at the radial site. °· You have redness, warmth, swelling, or pain at the radial site. °· You have drainage (other than a small amount of blood on the dressing). °· You have chills. °· You have a fever or persistent symptoms for more than 72 hours. °· You have a fever and your symptoms suddenly get worse. °· Your arm becomes pale, cool, tingly, or numb. °· You have heavy bleeding from the site. Hold pressure on the site. °Document Released: 08/11/2010 Document Revised:  10/01/2011 Document Reviewed: 08/11/2010 °ExitCare® Patient Information ©2015 [employer]. This information is not intended to replace advice given to you by your health care provider. Make sure you discuss any questions you have with your health care provider. ° °

## 2014-04-08 NOTE — H&P (View-Only) (Signed)
Patient ID: [name], female   DOB: 07/11/1941, 73 y.o.   MRN: [id number]      SUBJECTIVE: Pt returns for follow up on CV testing performed for the evaluation of chest pain. Nuclear stress testing demonstrated a moderate sized area of anterior wall ischemia. Echocardiography demonstrated normal LV systolic function, EF 50-09%, moderate LVH and grade I diastolic dysfunction. She denies any further episodes of chest tightness, and also wonders if some of her symptoms relate to her hiatal hernia.   Review of Systems: As per "subjective", otherwise negative.  No Known Allergies  Current Outpatient Prescriptions  Medication Sig Dispense Refill  . ADVAIR DISKUS 100-50 MCG/DOSE AEPB Inhale 1 puff into the lungs 2 (two) times daily.       Marland Kitchen aspirin EC 81 MG tablet Take 81 mg by mouth daily.      . calcium-vitamin D (OSCAL WITH D) 500-200 MG-UNIT per tablet Take 1 tablet by mouth 2 (two) times daily.  180 tablet  3  . fluticasone (FLONASE) 50 MCG/ACT nasal spray Place 2 sprays into both nostrils daily.  16 g  3  . hydrochlorothiazide (HYDRODIURIL) 25 MG tablet TAKE ONE TABLET BY MOUTH EVERY DAY  30 tablet  4  . pravastatin (PRAVACHOL) 40 MG tablet TAKE 1 TABLET BY MOUTH EVERY EVENING  30 tablet  3  . simethicone (MYLICON) 80 MG chewable tablet Chew 80 mg by mouth every 6 (six) hours as needed for flatulence.       No current facility-administered medications for this visit.    Past Medical History  Diagnosis Date  . Sinusitis   . OA (osteoarthritis) of knee   . Nicotine addiction   . COPD (chronic obstructive pulmonary disease)   . Obesity   . Hypertension   . Lung nodule seen on imaging study     Past Surgical History  Procedure Laterality Date  . Knee arthroscopy right    . Cholecystectomy    . Total knee arthroplasty right  05/29/2005    Dr. Aline Brochure   . Bilateral cataract surgery  7/27 & 03/01/09    Dr. Gershon Crane    . Partial hysterectomy    . Esophagogastroduodenoscopy   11/07/2010    Jenkins:hiatal hernia/no evidence of Barrett esophagitis.  The Z-line was noted to be at 39 cm from the teeth. CLO test negative  . Colonoscopy  04/2004    Dr. Leane Para Smith-->melanosis coli  . Colonoscopy with esophagogastroduodenoscopy (egd) N/A 02/25/2013    Dr. Fields:Normal mucosa in the terminal ileum/Severe melanosis throughout the entire examined colon/ONE COLON POLYP REMOVED (tubular adenoma)/Mild diverticulosis  in the sigmoid colon/EGD:The mucosa of the esophagus appeared normal/Non-erosive gastritis, empiric dilation with Savary dilator  . Savory dilation N/A 02/25/2013    Procedure: SAVORY DILATION;  Surgeon: Danie Binder, MD;  [facility];  Service: Endoscopy;  Laterality: N/A;  Venia Minks dilation N/A 02/25/2013    Procedure: Venia Minks DILATION;  Surgeon: Danie Binder, MD;  [facility];  Service: Endoscopy;  Laterality: N/A;  . 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 bx=focal erosion with inflammation and fibrosis    History   Social History  . Marital Status: Widowed    Spouse Name: N/A    Number of Children: 4  . Years of Education: N/A   Occupational History  . employed     Social History Main Topics  . Smoking status: Current Some Day  Smoker -- 10 years    Types: Cigarettes  . Smokeless tobacco: Never Used     Comment: smokes 1 or 2 per day  . Alcohol Use: No  . Drug Use: No  . Sexual Activity: Not on file   Other Topics Concern  . Not on file   Social History Narrative  . No narrative on file    BP 118/82 Pulse 96 Resp Temp Temp src SpO2 97% Weight 218 lb (98.884 kg) Height 5\' 6"  (1.676 m)    PHYSICAL EXAM General: NAD HEENT: Normal. Neck: No JVD, no thyromegaly. Lungs: Clear to auscultation bilaterally with normal respiratory effort. CV: Nondisplaced PMI.  Regular rate and rhythm, normal S1/S2, no S3/S4, no murmur. No  pretibial or periankle edema.  No carotid bruit.  Normal pedal pulses.  Abdomen: Soft, nontender, no hepatosplenomegaly, no distention.  Neurologic: Alert and oriented x 3.  Psych: Normal affect. Skin: Normal. Musculoskeletal: Normal range of motion, no gross deformities. Extremities: No clubbing or cyanosis.   ECG: Most recent ECG reviewed.      ASSESSMENT AND PLAN: 1. Chest pain, shortness of breath in setting of abnormal nuclear stress test with anterior wall ischemia and abnormal CT scan demonstrating 3-vessel CAD: Given her nuclear MPI study findings, symptoms and multiple cardiovascular risk factors including essential hypertension, hyperlipidemia and tobacco abuse as well as her strong family history of coronary artery disease, I will proceed with coronary angiography. Continue aspirin 81 mg and pravastatin 40 mg.  2. Essential HTN: Controlled today. Continue hydrochlorothiazide 25 mg daily.  3. Hyperlipidemia: On moderate intensity statin therapy with pravastatin 40 mg daily. Lipids on 02/23/2014 demonstrated total cholesterol 173, triglycerides 123, HDL 45, LDL 103. I will likely switch to Lipitor 40 mg daily at a minimum, but will await the results of cardiac catheterization. 4. Tobacco abuse: Cessation counseling previously provided.   Dispo: f/u after cath.   Kate Sable, M.D., F.A.C.C.

## 2014-04-08 NOTE — Interval H&P Note (Signed)
History and Physical Interval Note: Cath Lab Visit (complete for each Cath Lab visit)  Clinical Evaluation Leading to the Procedure:   ACS: No.  Non-ACS:    Anginal Classification: CCS III  Anti-ischemic medical therapy: Maximal Therapy (2 or more classes of medications)  Non-Invasive Test Results: Intermediate-risk stress test findings: cardiac mortality 1-3%/year  Prior CABG: No previous CABG       04/08/2014 11:57 AM  [name]  has presented today for surgery, with the diagnosis of abn nuc  The various methods of treatment have been discussed with the patient and family. After consideration of risks, benefits and other options for treatment, the patient has consented to  Procedure(s): LEFT HEART CATHETERIZATION WITH CORONARY ANGIOGRAM (N/A) as a surgical intervention .  The patient's history has been reviewed, patient examined, no change in status, stable for surgery.  I have reviewed the patient's chart and labs.  Questions were answered to the patient's satisfaction.     Sinclair Grooms

## 2014-04-22 ENCOUNTER — Telehealth: Payer: Self-pay | Admitting: *Deleted

## 2014-04-22 MED ORDER — HYDROCHLOROTHIAZIDE 25 MG PO TABS: 25.0000 mg | ORAL_TABLET | Freq: Every day | ORAL | Status: DC

## 2014-04-22 MED ORDER — PRAVASTATIN SODIUM 40 MG PO TABS: 40.0000 mg | ORAL_TABLET | Freq: Every day | ORAL | Status: DC

## 2014-04-22 NOTE — Telephone Encounter (Signed)
Pt called requesting refills on fluid pill and her cholesterol. Please advise.

## 2014-04-22 NOTE — Telephone Encounter (Signed)
meds refilled 

## 2014-05-04 ENCOUNTER — Telehealth: Payer: Self-pay | Admitting: Gastroenterology

## 2014-05-04 NOTE — Telephone Encounter (Signed)
Please contact patient and offer ov. She still needs EGD if her cardiology work up is completed.

## 2014-05-05 ENCOUNTER — Ambulatory Visit: Payer: Medicare Other | Admitting: Cardiovascular Disease

## 2014-05-06 ENCOUNTER — Ambulatory Visit (INDEPENDENT_AMBULATORY_CARE_PROVIDER_SITE_OTHER): Payer: Medicare Other | Admitting: Physician Assistant

## 2014-05-06 ENCOUNTER — Encounter: Payer: Self-pay | Admitting: Physician Assistant

## 2014-05-06 VITALS — BP 102/64 | HR 86 | Ht 67.0 in | Wt 219.0 lb

## 2014-05-06 DIAGNOSIS — E669 Obesity, unspecified: Secondary | ICD-10-CM

## 2014-05-06 DIAGNOSIS — I10 Essential (primary) hypertension: Secondary | ICD-10-CM

## 2014-05-06 DIAGNOSIS — I2510 Atherosclerotic heart disease of native coronary artery without angina pectoris: Secondary | ICD-10-CM | POA: Insufficient documentation

## 2014-05-06 DIAGNOSIS — F17200 Nicotine dependence, unspecified, uncomplicated: Secondary | ICD-10-CM

## 2014-05-06 DIAGNOSIS — Z720 Tobacco use: Secondary | ICD-10-CM

## 2014-05-06 NOTE — Assessment & Plan Note (Signed)
Weight loss discussed  

## 2014-05-06 NOTE — Telephone Encounter (Signed)
[name], please call pt and offer her an ov. Thanks.

## 2014-05-06 NOTE — Assessment & Plan Note (Signed)
Controlled.  

## 2014-05-06 NOTE — Telephone Encounter (Signed)
APPT MADE AND PATIENT IS AWARE

## 2014-05-06 NOTE — Assessment & Plan Note (Signed)
Smoking cessation discussed 

## 2014-05-06 NOTE — Assessment & Plan Note (Signed)
Stable without chest pain followup with Dr.Koneswaran in one year or when necessary

## 2014-05-06 NOTE — Progress Notes (Signed)
HPI: This is a very pleasant 73 year old female patient of Dr.Koneswaran who is evaluated for chest pain with a nuclear study demonstrating a moderate sized area of anterior wall ischemia. 2-D echo showed normal LV function EF 55-60% with moderate LVH and grade 1 diastolic dysfunction. She underwent cardiac catheterization on 04/08/14 which showed 50-70% first diagonal, 40-50% proximal RCA and no significant obstruction in the LAD or circumflex. Apparent hypertrophy of the left ventricle with low normal systolic function and abnormal filling pressures. EF 55-55%. Moderate left coronary calcification. There was no explanation of the abnormality noted on nuclear imaging. The ostial stenosis is moderate and should be treated with medical therapy/risk factor modification. It was felt findings on nuclear scan may represent attenuation artifact.  Patient denies any chest pain. She complains of a lot of reflux symptoms and how the hernia. She is going to make an appointment with her gastroenterologist. She is trying to cut back on smoking and is down to 3 cigarettes a day.  No Known Allergies   Current Outpatient Prescriptions  Medication Sig Dispense Refill  . aspirin EC 81 MG tablet Take 81 mg by mouth daily.      . Calcium Carbonate-Vitamin D (CALCIUM + D PO) Take 1 tablet by mouth daily.      . fluticasone (FLONASE) 50 MCG/ACT nasal spray Place 2 sprays into both nostrils daily as needed for allergies or rhinitis.      . hydrochlorothiazide (HYDRODIURIL) 25 MG tablet Take 1 tablet (25 mg total) by mouth daily.  30 tablet  3  . pravastatin (PRAVACHOL) 40 MG tablet Take 1 tablet (40 mg total) by mouth at bedtime.  30 tablet  3  . sodium chloride (OCEAN) 0.65 % SOLN nasal spray Place 1 spray into both nostrils as needed for congestion.       No current facility-administered medications for this visit.    Past Medical History  Diagnosis Date  . Sinusitis   . OA (osteoarthritis) of knee     . Nicotine addiction   . COPD (chronic obstructive pulmonary disease)   . Obesity   . Hypertension   . Lung nodule seen on imaging study     Past Surgical History  Procedure Laterality Date  . Knee arthroscopy right    . Cholecystectomy    . Total knee arthroplasty right  05/29/2005    Dr. Aline Brochure   . Bilateral cataract surgery  7/27 & 03/01/09    Dr. Gershon Crane    . Partial hysterectomy    . Esophagogastroduodenoscopy  11/07/2010    Jenkins:hiatal hernia/no evidence of Barrett esophagitis.  The Z-line was noted to be at 39 cm from the teeth. CLO test negative  . Colonoscopy  04/2004    Dr. Leane Para Smith-->melanosis coli  . Colonoscopy with esophagogastroduodenoscopy (egd) N/A 02/25/2013    Dr. Fields:Normal mucosa in the terminal ileum/Severe melanosis throughout the entire examined colon/ONE COLON POLYP REMOVED (tubular adenoma)/Mild diverticulosis  in the sigmoid colon/EGD:The mucosa of the esophagus appeared normal/Non-erosive gastritis, empiric dilation with Savary dilator  . Savory dilation N/A 02/25/2013    Procedure: SAVORY DILATION;  Surgeon: Danie Binder, MD;  Location: [facility];  Service: Endoscopy;  Laterality: N/A;  Venia Minks dilation N/A 02/25/2013    Procedure: Venia Minks DILATION;  Surgeon: Danie Binder, MD;  Location: [facility];  Service: Endoscopy;  Laterality: N/A;  . 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 bx=focal erosion with inflammation and fibrosis    Family History  Problem Relation Age of Onset  . Diabetes Mother   . Hypertension Mother   . Coronary artery disease Mother   . Pneumonia Father   . Hypertension Sister   . Hypertension Sister   . Hypertension Brother   . Hypertension Brother   . Colon cancer Neg Hx   . Liver disease Neg Hx     History   Social History  . Marital Status: Widowed    Spouse Name: N/A    Number of Children: 4  . Years of  Education: N/A   Occupational History  . employed     Social History Main Topics  . Smoking status: Current Some Day Smoker -- 0.30 packs/day for 10 years    Types: Cigarettes    Start date: 07/23/1962  . Smokeless tobacco: Never Used     Comment: smokes 1 or 2 per day  . Alcohol Use: No  . Drug Use: No  . Sexual Activity: Not on file   Other Topics Concern  . Not on file   Social History Narrative  . No narrative on file    ROS: See history of present illness otherwise negative  Ht 5\' 7"  (1.702 m)  Wt 219 lb (99.338 kg)  BMI 34.29 kg/m2  PHYSICAL EXAM: Well-nournished, in no acute distress. Neck: No JVD, HJR, Bruit, or thyroid enlargement  Lungs: No tachypnea, clear without wheezing, rales, or rhonchi  Cardiovascular: RRR, PMI not displaced, heart sounds normal, no murmurs, gallops, bruit, thrill, or heave.  Abdomen: BS normal. Soft without organomegaly, masses, lesions or tenderness.  Extremities: without cyanosis, clubbing or edema. Good distal pulses bilateral  SKin: Warm, no lesions or rashes   Musculoskeletal: No deformities  Neuro: no focal signs   Wt Readings from Last 3 Encounters:  05/06/14 219 lb (99.338 kg)  04/08/14 218 lb (98.884 kg)  04/08/14 218 lb (98.884 kg)    Lab Results  Component Value Date   WBC 8.2 04/08/2014   HGB 13.5 04/08/2014   HCT 39.2 04/08/2014   PLT 285 04/08/2014   GLUCOSE 117* 04/08/2014   CHOL 173 02/23/2014   TRIG 123 02/23/2014   HDL 45 02/23/2014   LDLCALC 103* 02/23/2014   ALT 8 02/23/2014   AST 13 02/23/2014   NA 142 04/08/2014   K 4.1 04/08/2014   CL 101 04/08/2014   CREATININE 0.71 04/08/2014   BUN 9 04/08/2014   CO2 24 04/08/2014   TSH 0.545 06/15/2013   INR 1.08 04/08/2014   HGBA1C 6.5* 02/23/2014   MICROALBUR 2.51* 08/22/2009    EKG: Normal sinus rhythm, normal EKG  IMPRESSIONS:  1. 50-70% first diagonal and 40-50% proximal RCA. No significant obstruction is noted in the LAD or circumflex. The right coronary is also  widely patent other than as mentioned. 2. Apparent hypertrophy of the left ventricle with low normal systolic function and abnormal filling pressures. EF is 50-55%. 3. Moderate left coronary calcification.   RECOMMENDATION:  No explanation for the abnormality noted on nuclear imaging. Perhaps the finding represents an attenuation artifact. The diagonal supplies the mid lateral wall based upon the angiographic images. The ostial stenosis is moderate and should be treated with medical therapy/risk factor modification.

## 2014-05-06 NOTE — Patient Instructions (Signed)
Your physician wants you to follow-up as needed   Your physician recommends that you continue on your current medications as directed. Please refer to the Current Medication list given to you today.  Thank you for choosing [facility]!!

## 2014-05-24 ENCOUNTER — Encounter: Payer: Self-pay | Admitting: Physician Assistant

## 2014-06-09 ENCOUNTER — Ambulatory Visit (INDEPENDENT_AMBULATORY_CARE_PROVIDER_SITE_OTHER): Payer: Medicare Other | Admitting: Gastroenterology

## 2014-06-09 ENCOUNTER — Encounter: Payer: Self-pay | Admitting: Gastroenterology

## 2014-06-09 VITALS — BP 141/73 | HR 96 | Temp 97.4°F | Ht 66.0 in | Wt 217.0 lb

## 2014-06-09 DIAGNOSIS — K259 Gastric ulcer, unspecified as acute or chronic, without hemorrhage or perforation: Secondary | ICD-10-CM

## 2014-06-09 DIAGNOSIS — K219 Gastro-esophageal reflux disease without esophagitis: Secondary | ICD-10-CM

## 2014-06-09 DIAGNOSIS — K5900 Constipation, unspecified: Secondary | ICD-10-CM

## 2014-06-09 NOTE — Patient Instructions (Signed)
1. Upper endoscopy with Dr. Oneida Alar. Call to schedule once you confirm you have a ride.

## 2014-06-09 NOTE — Progress Notes (Signed)
Primary Care Physician:  [name], MD  Primary Gastroenterologist:  Barney Drain, MD   Chief Complaint  Patient presents with  . Follow-up    EGD    HPI:  [name] is a 73 y.o. female here to schedule her follow-up EGD. She had EGD back in February of this year that showed incomplete esophageal with in the midesophagus, medium-sized hiatal hernia, 2 small irregular shaped ulcers with surrounding edema in the gastric antrum. She was advised to have follow-up EGD in 3 months. This was postponed due to cardiac workup. Recent cardiac catheterization with mild disease being treated medically.  No n/v, abdominal pain. Appetite ok. No heartburn on prilosec OTC. Three glasses of milk a day. BM regular with Aloe. No melena, brbpr.  Current Outpatient Prescriptions  Medication Sig Dispense Refill  . ALOE VERA PO Take 500 mg by mouth daily.    Marland Kitchen aspirin EC 81 MG tablet Take 81 mg by mouth daily.    . Calcium Carbonate-Vitamin D (CALCIUM + D PO) Take 1 tablet by mouth daily.    . fluticasone (FLONASE) 50 MCG/ACT nasal spray Place 2 sprays into both nostrils daily as needed for allergies or rhinitis.    . GuaiFENesin (MUCUS RELIEF PO) Take 400 mg by mouth daily.    . hydrochlorothiazide (HYDRODIURIL) 25 MG tablet Take 1 tablet (25 mg total) by mouth daily. 30 tablet 3  . Omeprazole Magnesium (PRILOSEC OTC PO) Take 20.6 mg by mouth as needed.    . pravastatin (PRAVACHOL) 40 MG tablet Take 1 tablet (40 mg total) by mouth at bedtime. 30 tablet 3  . Red Yeast Rice Extract (RED YEAST RICE PO) Take 635 mg by mouth daily.    . sodium chloride (OCEAN) 0.65 % SOLN nasal spray Place 1 spray into both nostrils as needed for congestion.     No current facility-administered medications for this visit.    Allergies as of 06/09/2014  . (No Known Allergies)    Past Medical History  Diagnosis Date  . Sinusitis   . OA (osteoarthritis) of knee   . Nicotine addiction   . COPD (chronic  obstructive pulmonary disease)   . Obesity   . Hypertension   . Lung nodule seen on imaging study     Past Surgical History  Procedure Laterality Date  . Knee arthroscopy right    . Cholecystectomy    . Total knee arthroplasty right  05/29/2005    Dr. Aline Brochure   . Bilateral cataract surgery  7/27 & 03/01/09    Dr. Gershon Crane    . Partial hysterectomy    . Esophagogastroduodenoscopy  11/07/2010    Jenkins:hiatal hernia/no evidence of Barrett esophagitis.  The Z-line was noted to be at 39 cm from the teeth. CLO test negative  . Colonoscopy  04/2004    Dr. Leane Para Smith-->melanosis coli  . Colonoscopy with esophagogastroduodenoscopy (egd) N/A 02/25/2013    Dr. Fields:Normal mucosa in the terminal ileum/Severe melanosis throughout the entire examined colon/ONE COLON POLYP REMOVED (tubular adenoma)/Mild diverticulosis  in the sigmoid colon/EGD:The mucosa of the esophagus appeared normal/Non-erosive gastritis, empiric dilation with Savary dilator  . Savory dilation N/A 02/25/2013    Procedure: SAVORY DILATION;  Surgeon: Danie Binder, MD;  Location: [facility];  Service: Endoscopy;  Laterality: N/A;  Venia Minks dilation N/A 02/25/2013    Procedure: Venia Minks DILATION;  Surgeon: Danie Binder, MD;  Location: [facility];  Service: Endoscopy;  Laterality: N/A;  . 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 bx=focal erosion with inflammation and fibrosis    Family History  Problem Relation Age of Onset  . Diabetes Mother   . Hypertension Mother   . Coronary artery disease Mother   . Pneumonia Father   . Hypertension Sister   . Hypertension Sister   . Hypertension Brother   . Hypertension Brother   . Colon cancer Neg Hx   . Liver disease Neg Hx     History   Social History  . Marital Status: Widowed    Spouse Name: N/A    Number of Children: 4  . Years of Education: N/A   Occupational History  .  employed     Social History Main Topics  . Smoking status: Current Some Day Smoker -- 0.30 packs/day for 10 years    Types: Cigarettes    Start date: 07/23/1962  . Smokeless tobacco: Never Used     Comment: smokes 1 or 2 per day  . Alcohol Use: No  . Drug Use: No  . Sexual Activity: Not on file   Other Topics Concern  . Not on file   Social History Narrative      ROS:  General: Negative for anorexia, weight loss, fever, chills, fatigue, weakness. Eyes: Negative for vision changes.  ENT: Negative for hoarseness, difficulty swallowing , nasal congestion. CV: Negative for chest pain, angina, palpitations, dyspnea on exertion, peripheral edema.  Respiratory: Negative for dyspnea at rest, dyspnea on exertion, cough, sputum, wheezing.  GI: See history of present illness. GU:  Negative for dysuria, hematuria, urinary incontinence, urinary frequency, nocturnal urination.  MS: Negative for joint pain, low back pain.  Derm: Negative for rash or itching.  Neuro: Negative for weakness, abnormal sensation, seizure, frequent headaches, memory loss, confusion.  Psych: Negative for anxiety, depression, suicidal ideation, hallucinations.  Endo: Negative for unusual weight change.  Heme: Negative for bruising or bleeding. Allergy: Negative for rash or hives.    Physical Examination:  BP 141/73 mmHg  Pulse 96  Temp(Src) 97.4 F (36.3 C) (Oral)  Ht 5\' 6"  (1.676 m)  Wt 217 lb (98.431 kg)  BMI 35.04 kg/m2   General: Well-nourished, well-developed in no acute distress.  Head: Normocephalic, atraumatic.   Eyes: Conjunctiva pink, no icterus. Mouth: Oropharyngeal mucosa moist and pink , no lesions erythema or exudate. Neck: Supple without thyromegaly, masses, or lymphadenopathy.  Lungs: Clear to auscultation bilaterally.  Heart: Regular rate and rhythm, no murmurs rubs or gallops.  Abdomen: Bowel sounds are normal, nontender, nondistended, no hepatosplenomegaly or masses, no abdominal  bruits or    hernia , no rebound or guarding.  Diastasis recti. Rectal: Not performed Extremities: No lower extremity edema. No clubbing or deformities.  Neuro: Alert and oriented x 4 , grossly normal neurologically.  Skin: Warm and dry, no rash or jaundice.   Psych: Alert and cooperative, normal mood and affect.  Labs: Lab Results  Component Value Date   CREATININE 0.71 04/08/2014   BUN 9 04/08/2014   NA 142 04/08/2014   K 4.1 04/08/2014   CL 101 04/08/2014   CO2 24 04/08/2014   Lab Results  Component Value Date   WBC 8.2 04/08/2014   HGB 13.5 04/08/2014   HCT 39.2 04/08/2014   MCV 86.2 04/08/2014   PLT 285 04/08/2014   Lab Results  Component Value Date   ALT 8 02/23/2014   AST 13 02/23/2014   ALKPHOS 85 02/23/2014   BILITOT  0.6 02/23/2014     Imaging Studies: No results found.

## 2014-06-09 NOTE — Assessment & Plan Note (Signed)
Due for surveillance EGD.  I have discussed the risks, alternatives, benefits with regards to but not limited to the risk of reaction to medication, bleeding, infection, perforation and the patient is agreeable to proceed. Written consent to be obtained. Overall doing well. Continue PPI therapy. She was concerned about abdominal protrusion, evidence of diastases recti on exam. No hernia or masses noted on recent CT scan.

## 2014-06-09 NOTE — Progress Notes (Signed)
cc'ed to pcp °

## 2014-06-09 NOTE — Assessment & Plan Note (Signed)
Stable. Occasionally uses [facility]. Requested samples today.

## 2014-06-15 ENCOUNTER — Ambulatory Visit: Payer: Medicare Other

## 2014-06-15 ENCOUNTER — Ambulatory Visit (INDEPENDENT_AMBULATORY_CARE_PROVIDER_SITE_OTHER): Payer: Medicare Other

## 2014-06-15 DIAGNOSIS — Z23 Encounter for immunization: Secondary | ICD-10-CM

## 2014-06-15 MED ORDER — FLUTICASONE-SALMETEROL 100-50 MCG/DOSE IN AEPB: 1.0000 | INHALATION_SPRAY | Freq: Two times a day (BID) | RESPIRATORY_TRACT | Status: DC

## 2014-06-22 NOTE — Progress Notes (Signed)
I have a spot held for 07/06/14 for her to have the EGD. I tried to call her today to conform the day and time with no answer

## 2014-07-01 ENCOUNTER — Encounter (HOSPITAL_COMMUNITY): Payer: Self-pay | Admitting: Interventional Cardiology

## 2014-07-01 ENCOUNTER — Other Ambulatory Visit: Payer: Self-pay | Admitting: Family Medicine

## 2014-07-02 LAB — COMPLETE METABOLIC PANEL WITH GFR
ALT: 11 U/L (ref 0–35)
AST: 14 U/L (ref 0–37)
Albumin: 4 g/dL (ref 3.5–5.2)
Alkaline Phosphatase: 106 U/L (ref 39–117)
BUN: 13 mg/dL (ref 6–23)
CALCIUM: 9.5 mg/dL (ref 8.4–10.5)
CO2: 27 meq/L (ref 19–32)
CREATININE: 0.86 mg/dL (ref 0.50–1.10)
Chloride: 101 mEq/L (ref 96–112)
GFR, Est African American: 79 mL/min
GFR, Est Non African American: 68 mL/min
Glucose, Bld: 119 mg/dL — ABNORMAL HIGH (ref 70–99)
POTASSIUM: 4.3 meq/L (ref 3.5–5.3)
Sodium: 139 mEq/L (ref 135–145)
Total Bilirubin: 0.6 mg/dL (ref 0.2–1.2)
Total Protein: 7.5 g/dL (ref 6.0–8.3)

## 2014-07-02 LAB — LIPID PANEL
Cholesterol: 191 mg/dL (ref 0–200)
HDL: 54 mg/dL (ref >39)
LDL Cholesterol: 112 mg/dL — ABNORMAL HIGH (ref 0–99)
TRIGLYCERIDES: 124 mg/dL (ref <150)
Total CHOL/HDL Ratio: 3.5 Ratio
VLDL: 25 mg/dL (ref 0–40)

## 2014-07-02 LAB — HEMOGLOBIN A1C
Hgb A1c MFr Bld: 6.3 % — ABNORMAL HIGH (ref <5.7)
Mean Plasma Glucose: 134 mg/dL — ABNORMAL HIGH (ref <117)

## 2014-07-05 ENCOUNTER — Telehealth: Payer: Self-pay

## 2014-07-05 NOTE — Telephone Encounter (Signed)
FYI: Pt never called back to set up EGD. Her 30 days will be up on 07/09/14.

## 2014-07-06 ENCOUNTER — Other Ambulatory Visit: Payer: Self-pay

## 2014-07-06 ENCOUNTER — Encounter (HOSPITAL_COMMUNITY): Payer: Self-pay | Admitting: *Deleted

## 2014-07-06 ENCOUNTER — Ambulatory Visit (HOSPITAL_COMMUNITY)
Admission: RE | Admit: 2014-07-06 | Discharge: 2014-07-06 | Disposition: A | Discharge status: Home / Self Care | Payer: Medicare Other | Source: Ambulatory Visit | Attending: Gastroenterology | Admitting: Gastroenterology

## 2014-07-06 ENCOUNTER — Encounter (HOSPITAL_COMMUNITY)
Admission: RE | Disposition: A | Discharge status: Home / Self Care | Payer: Self-pay | Source: Ambulatory Visit | Attending: Gastroenterology

## 2014-07-06 DIAGNOSIS — K219 Gastro-esophageal reflux disease without esophagitis: Secondary | ICD-10-CM

## 2014-07-06 DIAGNOSIS — E669 Obesity, unspecified: Secondary | ICD-10-CM | POA: Insufficient documentation

## 2014-07-06 DIAGNOSIS — K5900 Constipation, unspecified: Secondary | ICD-10-CM

## 2014-07-06 DIAGNOSIS — F17210 Nicotine dependence, cigarettes, uncomplicated: Secondary | ICD-10-CM | POA: Diagnosis not present

## 2014-07-06 DIAGNOSIS — K449 Diaphragmatic hernia without obstruction or gangrene: Secondary | ICD-10-CM | POA: Insufficient documentation

## 2014-07-06 DIAGNOSIS — K319 Disease of stomach and duodenum, unspecified: Secondary | ICD-10-CM | POA: Diagnosis present

## 2014-07-06 DIAGNOSIS — Z7982 Long term (current) use of aspirin: Secondary | ICD-10-CM | POA: Diagnosis not present

## 2014-07-06 DIAGNOSIS — I10 Essential (primary) hypertension: Secondary | ICD-10-CM | POA: Diagnosis not present

## 2014-07-06 DIAGNOSIS — K279 Peptic ulcer, site unspecified, unspecified as acute or chronic, without hemorrhage or perforation: Secondary | ICD-10-CM

## 2014-07-06 DIAGNOSIS — J449 Chronic obstructive pulmonary disease, unspecified: Secondary | ICD-10-CM | POA: Insufficient documentation

## 2014-07-06 HISTORY — PX: ESOPHAGOGASTRODUODENOSCOPY: SHX5428

## 2014-07-06 SURGERY — EGD (ESOPHAGOGASTRODUODENOSCOPY)
Anesthesia: Moderate Sedation

## 2014-07-06 MED ORDER — MIDAZOLAM HCL 5 MG/5ML IJ SOLN
INTRAMUSCULAR | Status: DC | PRN
  Administered 2014-07-06: 2 mg via INTRAVENOUS
  Administered 2014-07-06: 1 mg via INTRAVENOUS

## 2014-07-06 MED ORDER — MEPERIDINE HCL 100 MG/ML IJ SOLN
INTRAMUSCULAR | Status: AC
  Filled 2014-07-06: qty 2

## 2014-07-06 MED ORDER — MEPERIDINE HCL 100 MG/ML IJ SOLN
INTRAMUSCULAR | Status: DC | PRN
  Administered 2014-07-06 (×2): 25 mg via INTRAVENOUS

## 2014-07-06 MED ORDER — MIDAZOLAM HCL 5 MG/5ML IJ SOLN
INTRAMUSCULAR | Status: AC
  Filled 2014-07-06: qty 10

## 2014-07-06 MED ORDER — OMEPRAZOLE MAGNESIUM 20 MG PO TBEC: DELAYED_RELEASE_TABLET | ORAL | Status: DC

## 2014-07-06 MED ORDER — SODIUM CHLORIDE 0.9 % IV SOLN
INTRAVENOUS | Status: DC
  Administered 2014-07-06: 12:00:00 via INTRAVENOUS

## 2014-07-06 MED ORDER — STERILE WATER FOR IRRIGATION IR SOLN
Status: DC | PRN
  Administered 2014-07-06: 13:00:00

## 2014-07-06 MED ORDER — LIDOCAINE VISCOUS 2 % MT SOLN
OROMUCOSAL | Status: DC | PRN
  Administered 2014-07-06: 2 via OROMUCOSAL

## 2014-07-06 MED ORDER — LIDOCAINE VISCOUS 2 % MT SOLN
OROMUCOSAL | Status: AC
  Filled 2014-07-06: qty 15

## 2014-07-06 NOTE — H&P (Signed)
Primary Care Physician:  [name], MD Primary Gastroenterologist:  Dr. Oneida Alar  Pre-Procedure History & Physical: HPI:  [name] is a 73 y.o. female here for ulcer surveillance  Past Medical History  Diagnosis Date  . Sinusitis   . OA (osteoarthritis) of knee   . Nicotine addiction   . COPD (chronic obstructive pulmonary disease)   . Obesity   . Hypertension   . Lung nodule seen on imaging study     Past Surgical History  Procedure Laterality Date  . Knee arthroscopy right    . Cholecystectomy    . Total knee arthroplasty right  05/29/2005    Dr. Aline Brochure   . Bilateral cataract surgery  7/27 & 03/01/09    Dr. Gershon Crane    . Partial hysterectomy    . Esophagogastroduodenoscopy  11/07/2010    Jenkins:hiatal hernia/no evidence of Barrett esophagitis.  The Z-line was noted to be at 39 cm from the teeth. CLO test negative  . Colonoscopy  04/2004    Dr. Leane Para Smith-->melanosis coli  . Colonoscopy with esophagogastroduodenoscopy (egd) N/A 02/25/2013    Dr. Kimie Pidcock:Normal mucosa in the terminal ileum/Severe melanosis throughout the entire examined colon/ONE COLON POLYP REMOVED (tubular adenoma)/Mild diverticulosis  in the sigmoid colon/EGD:The mucosa of the esophagus appeared normal/Non-erosive gastritis, empiric dilation with Savary dilator  . Savory dilation N/A 02/25/2013    Procedure: SAVORY DILATION;  Surgeon: Danie Binder, MD;  Location: [facility];  Service: Endoscopy;  Laterality: N/A;  Venia Minks dilation N/A 02/25/2013    Procedure: Venia Minks DILATION;  Surgeon: Danie Binder, MD;  [facility]: [facility];  Service: Endoscopy;  Laterality: N/A;  . 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 bx=focal erosion with inflammation and fibrosis  . Left heart catheterization with coronary angiogram N/A 04/08/2014    Procedure: LEFT HEART CATHETERIZATION WITH CORONARY ANGIOGRAM;  Surgeon:  Sinclair Grooms, MD;  Location: [facility];  Service: Cardiovascular;  Laterality: N/A;    Prior to Admission medications   Medication Sig Start Date End Date Taking? Authorizing Provider  ALOE VERA PO Take 500 mg by mouth daily.   Yes Historical Provider, MD  aspirin EC 81 MG tablet Take 81 mg by mouth daily.   Yes Historical Provider, MD  Calcium Carbonate-Vitamin D (CALCIUM + D PO) Take 1 tablet by mouth daily.   Yes Historical Provider, MD  fluticasone (FLONASE) 50 MCG/ACT nasal spray Place 2 sprays into both nostrils daily as needed for allergies or rhinitis.   Yes Historical Provider, MD  Fluticasone-Salmeterol (ADVAIR DISKUS) 100-50 MCG/DOSE AEPB Inhale 1 puff into the lungs 2 (two) times daily. 06/15/14  Yes Fayrene Helper, MD  GuaiFENesin (MUCUS RELIEF PO) Take 400 mg by mouth daily.   Yes Historical Provider, MD  hydrochlorothiazide (HYDRODIURIL) 25 MG tablet Take 1 tablet (25 mg total) by mouth daily. 04/22/14  Yes Fayrene Helper, MD  Omeprazole Magnesium (PRILOSEC OTC PO) Take 20.6 mg by mouth as needed.   Yes Historical Provider, MD  pravastatin (PRAVACHOL) 40 MG tablet Take 1 tablet (40 mg total) by mouth at bedtime. 04/22/14  Yes Fayrene Helper, MD  Red Yeast Rice Extract (RED YEAST RICE PO) Take 635 mg by mouth daily.   Yes Historical Provider, MD  sodium chloride (OCEAN) 0.65 % SOLN nasal spray Place 1 spray into both nostrils as needed for congestion.   Yes Historical Provider, MD  Allergies as of 07/06/2014  . (No Known Allergies)   Family History  Problem Relation Age of Onset  . Diabetes Mother   . Hypertension Mother   . Coronary artery disease Mother   . Pneumonia Father   . Hypertension Sister   . Hypertension Sister   . Hypertension Brother   . Hypertension Brother   . Colon cancer Neg Hx   . Liver disease Neg Hx    History   Social History  . Marital Status: Widowed    Spouse Name: N/A    Number of Children: 4  . Years of Education: N/A    Occupational History  . employed     Social History Main Topics  . Smoking status: Current Some Day Smoker -- 0.30 packs/day for 10 years    Types: Cigarettes    Start date: 07/23/1962  . Smokeless tobacco: Never Used     Comment: smokes 1 or 2 per day  . Alcohol Use: No  . Drug Use: No  . Sexual Activity: Not on file   Other Topics Concern  . Not on file   Social History Narrative   Review of Systems: See HPI, otherwise negative ROS  Physical Exam: BP 169/106 mmHg  Pulse 93  Temp(Src) 98.1 F (36.7 C) (Oral)  Resp 20  Ht 5\' 6"  (1.676 m)  Wt 218 lb (98.884 kg)  BMI 35.20 kg/m2  SpO2 96% General:   Alert,  pleasant and cooperative in NAD Head:  Normocephalic and atraumatic. Neck:  Supple; Lungs:  Clear throughout to auscultation.    Heart:  Regular rate and rhythm. Abdomen:  Soft, nontender and nondistended. Normal bowel sounds, without guarding, and without rebound.   Neurologic:  Alert and  oriented x4;  grossly normal neurologically.  Impression/Plan:   PUD  PLAN:  REPEAT EGD TO CONFIRM ULCERS ARE HEALED.

## 2014-07-06 NOTE — Progress Notes (Signed)
REVIEWED.  

## 2014-07-06 NOTE — Discharge Instructions (Signed)
You have gastritis. I biopsied your stomach.   TAKE OMEPRAZOLE 30 MINUTES PRIOR TO your first MEAL ONCE DAILY FORever.  TAKE A PROBIOTIC DAILY.([facility] BRAND, [place]). STOP IT IF YOU FEEL IT'S NOT MAKING A DIFFERENCE after 4 mos.  AVOID TRIGGERS FOR GASTRITIS. SEE INFO BELOW.  FOLLOW A LOW FAT DIET. SEE INFO BELOW.  YOUR BIOPSY WILL BE BACK IN 14 DAYS OR YOU CAN LOOK THEM UP ON MY CHART AFTER DEC 18.  FOLLOW UP IN 6 MOS.  UPPER ENDOSCOPY AFTER CARE Read the instructions outlined below and refer to this sheet in the next week. These discharge instructions provide you with general information on caring for yourself after you leave the hospital. While your treatment has been planned according to the most current medical practices available, unavoidable complications occasionally occur. If you have any problems or questions after discharge, call DR. Rayven Rettig, [phone].  ACTIVITY  You may resume your regular activity, but move at a slower pace for the next 24 hours.   Take frequent rest periods for the next 24 hours.   Walking will help get rid of the air and reduce the bloated feeling in your belly (abdomen).   No driving for 24 hours (because of the medicine (anesthesia) used during the test).   You may shower.   Do not sign any important legal documents or operate any machinery for 24 hours (because of the anesthesia used during the test).    NUTRITION  Drink plenty of fluids.   You may resume your normal diet as instructed by your doctor.   Begin with a light meal and progress to your normal diet. Heavy or fried foods are harder to digest and may make you feel sick to your stomach (nauseated).   Avoid alcoholic beverages for 24 hours or as instructed.    MEDICATIONS  You may resume your normal medications.   WHAT YOU CAN EXPECT TODAY  Some feelings of bloating in the abdomen.   Passage of more gas than usual.    IF YOU HAD A BIOPSY  TAKEN DURING THE UPPER ENDOSCOPY:  Eat a soft diet IF YOU HAVE NAUSEA, BLOATING, ABDOMINAL PAIN, OR VOMITING.    FINDING OUT THE RESULTS OF YOUR TEST Not all test results are available during your visit. DR. Oneida Alar WILL CALL YOU WITHIN 14 DAYS OF YOUR PROCEDUE WITH YOUR RESULTS. Do not assume everything is normal if you have not heard from DR. Mechell Girgis, CALL HER OFFICE AT [phone].  SEEK IMMEDIATE MEDICAL ATTENTION AND CALL THE OFFICE: [phone] IF:  You have more than a spotting of blood in your stool.   Your belly is swollen (abdominal distention).   You are nauseated or vomiting.   You have a temperature over 101F.   You have abdominal pain or discomfort that is severe or gets worse throughout the day.   Gastritis  Gastritis is an inflammation (the body's way of reacting to injury and/or infection) of the stomach. It is often caused by viral or bacterial (germ) infections. It can also be caused BY ASPIRIN, BC/GOODY POWDER'S, (IBUPROFEN) MOTRIN, OR ALEVE (NAPROXEN), chemicals (including alcohol), SPICY FOODS, and medications. This illness may be associated with generalized malaise (feeling tired, not well), UPPER ABDOMINAL STOMACH cramps, and fever. One common bacterial cause of gastritis is an organism known as H. Pylori. This can be treated with antibiotics.    Low-Fat Diet  BREADS, CEREALS, PASTA, RICE, DRIED PEAS, AND BEANS These products are high in carbohydrates and  most are low in fat. Therefore, they can be increased in the diet as substitutes for fatty foods. They too, however, contain calories and should not be eaten in excess. Cereals can be eaten for snacks as well as for breakfast.  Include foods that contain fiber (fruits, vegetables, whole grains, and legumes). Research shows that fiber may lower blood cholesterol levels, especially the water-soluble fiber found in fruits, vegetables, oat products, and legumes.  FRUITS AND VEGETABLES It is good to eat fruits and  vegetables. Besides being sources of fiber, both are rich in vitamins and some minerals. They help you get the daily allowances of these nutrients. Fruits and vegetables can be used for snacks and desserts.  MEATS Limit lean meat, chicken, [place], and fish to no more than 6 ounces per day.  Beef, Pork, and Lamb Use lean cuts of beef, pork, and lamb. Lean cuts include:  Extra-lean ground beef.  Arm roast.  Sirloin tip.  Center-cut ham.  Round steak.  Loin chops.  Rump roast.  Tenderloin.  Trim all fat off the outside of meats before cooking. It is not necessary to severely decrease the intake of red meat, but lean choices should be made. Lean meat is rich in protein and contains a highly absorbable form of iron. Premenopausal women, in particular, should avoid reducing lean red meat because this could increase the risk for low red blood cells (iron-deficiency anemia).  Chicken and [place] These are good sources of protein. The fat of poultry can be reduced by removing the skin and underlying fat layers before cooking. Chicken and [place] can be substituted for lean red meat in the diet. Poultry should not be fried or covered with high-fat sauces.  Fish and Shellfish Fish is a good source of protein. Shellfish contain cholesterol, but they usually are low in saturated fatty acids. The preparation of fish is important. Like chicken and [place], they should not be fried or covered with high-fat sauces.  EGGS Egg whites contain no fat or cholesterol. They can be eaten often. Try 1 to 2 egg whites instead of whole eggs in recipes or use egg substitutes that do not contain yolk.  MILK AND DAIRY PRODUCTS Use skim or 1% milk instead of 2% or whole milk. Decrease whole milk, natural, and processed cheeses. Use nonfat or low-fat (2%) cottage cheese or low-fat cheeses made from vegetable oils. Choose nonfat or low-fat (1 to 2%) yogurt. Experiment with evaporated skim milk in recipes that call for heavy  cream. Substitute low-fat yogurt or low-fat cottage cheese for sour cream in dips and salad dressings. Have at least 2 servings of low-fat dairy products, such as 2 glasses of skim (or 1%) milk each day to help get your daily calcium intake.  FATS AND OILS Reduce the total intake of fats, especially saturated fat. Butterfat, lard, and beef fats are high in saturated fat and cholesterol. These should be avoided as much as possible. Vegetable fats do not contain cholesterol, but certain vegetable fats, such as coconut oil, palm oil, and palm kernel oil are very high in saturated fats. These should be limited. These fats are often used in bakery goods, processed foods, popcorn, oils, and nondairy creamers. Vegetable shortenings and some peanut butters contain hydrogenated oils, which are also saturated fats. Read the labels on these foods and check for saturated vegetable oils.  Unsaturated vegetable oils and fats do not raise blood cholesterol. However, they should be limited because they are fats and are high in calories.  Total fat should still be limited to 30% of your daily caloric intake. Desirable liquid vegetable oils are corn oil, cottonseed oil, olive oil, canola oil, safflower oil, soybean oil, and sunflower oil. Peanut oil is not as good, but small amounts are acceptable. Buy a heart-healthy tub margarine that has no partially hydrogenated oils in the ingredients. Mayonnaise and salad dressings often are made from unsaturated fats, but they should also be limited because of their high calorie and fat content. Seeds, nuts, peanut butter, olives, and avocados are high in fat, but the fat is mainly the unsaturated type. These foods should be limited mainly to avoid excess calories and fat.  OTHER EATING TIPS Snacks  Most sweets should be limited as snacks. They tend to be rich in calories and fats, and their caloric content outweighs their nutritional value. Some good choices in snacks are graham  crackers, melba toast, soda crackers, bagels (no egg), English muffins, fruits, and vegetables. These snacks are preferable to snack crackers, [place] fries, and chips. Popcorn should be air-popped or cooked in small amounts of liquid vegetable oil.  Desserts Eat fruit, low-fat yogurt, and fruit ices instead of pastries, cake, and cookies. Sherbet, angel food cake, gelatin dessert, frozen low-fat yogurt, or other frozen products that do not contain saturated fat (pure fruit juice bars, frozen ice pops) are also acceptable.   COOKING METHODS Choose those methods that use little or no fat. They include: Poaching.  Braising.  Steaming.  Grilling.  Baking.  Stir-frying.  Broiling.  Microwaving.  Foods can be cooked in a nonstick pan without added fat, or use a nonfat cooking spray in regular cookware. Limit fried foods and avoid frying in saturated fat. Add moisture to lean meats by using water, broth, cooking wines, and other nonfat or low-fat sauces along with the cooking methods mentioned above. Soups and stews should be chilled after cooking. The fat that forms on top after a few hours in the refrigerator should be skimmed off. When preparing meals, avoid using excess salt. Salt can contribute to raising blood pressure in some people.  EATING AWAY FROM HOME Order entres, potatoes, and vegetables without sauces or butter. When meat exceeds the size of a deck of cards (3 to 4 ounces), the rest can be taken home for another meal. Choose vegetable or fruit salads and ask for low-calorie salad dressings to be served on the side. Use dressings sparingly. Limit high-fat toppings, such as bacon, crumbled eggs, cheese, sunflower seeds, and olives. Ask for heart-healthy tub margarine instead of butter.

## 2014-07-07 NOTE — Op Note (Signed)
[facility] [address] [place], [address]   ENDOSCOPY PROCEDURE REPORT  PATIENT: [name], [name]  MR#: [id number] BIRTHDATE: Oct 24, 1940 , 73  yrs. old GENDER: female  ENDOSCOPIST: Barney Drain, MD REFERRED SK:[id number] Moshe Cipro, M.D.  PROCEDURE DATE: 07/06/2014 PROCEDURE:   EGD w/ biopsy  INDICATIONS:assess for ulcers. MEDICATIONS: Demerol 50 mg IV and Versed 3 mg IV TOPICAL ANESTHETIC:   Viscous Xylocaine ASA CLASS:  DESCRIPTION OF PROCEDURE:     Physical exam was performed.  Informed consent was obtained from the patient after explaining the benefits, risks, and alternatives to the procedure.  The patient was connected to the monitor and placed in the left lateral position.  Continuous oxygen was provided by nasal cannula and IV medicine administered through an indwelling cannula.  After administration of sedation, the patients esophagus was intubated and the EG-[id number] ([id number])  endoscope was advanced under direct visualization to the second portion of the duodenum.  The scope was removed slowly by carefully examining the color, texture, anatomy, and integrity of the mucosa on the way out.  The patient was recovered in endoscopy and discharged home in satisfactory condition.    ESOPHAGUS: The mucosa of the esophagus appeared normal.   STOMACH: A small hiatal hernia was noted.   Multiple small erosions were found in the gastric antrum.  Multiple biopsies was performed using cold forceps.   DUODENUM: The duodenal mucosa showed no abnormalities in the bulb and 2nd part of the duodenum. COMPLICATIONS: There were no immediate complications.  ENDOSCOPIC IMPRESSION: 1.   ULCERS HEALED.EROSIONS PERSIST WHILE USING ASA. 2.   Small hiatal hernia  RECOMMENDATIONS: TAKE OMEPRAZOLE 30 MINUTES PRIOR TO your first MEAL ONCE DAILY FORever. TAKE A PROBIOTIC DAILY.  STOP IT IF IT'S NOT MAKING A DIFFERENCE after 4 mos. AVOID TRIGGERS FOR GASTRITIS. FOLLOW A LOW  FAT DIET. Await biopsy FOLLOW UP IN 6 MOS.  REPEAT EXAM: ______________________________ eSignedBarney Drain, MD July 23, 2014 4:59 PM   CPT CODES: ICD CODES:  The ICD and CPT codes recommended by this software are interpretations from the data that the clinical staff has captured with the software.  The verification of the translation of this report to the ICD and CPT codes and modifiers is the sole responsibility of the health care institution and practicing physician where this report was generated.  [employer] will not be held responsible for the validity of the ICD and CPT codes included on this report.  AMA assumes no liability for data contained or not contained herein. CPT is a Designer, television/film set of the [employer].

## 2014-07-13 NOTE — Telephone Encounter (Signed)
Patient had EGD 07/07/14.

## 2014-07-14 ENCOUNTER — Encounter (HOSPITAL_COMMUNITY): Payer: Self-pay | Admitting: Gastroenterology

## 2014-07-26 ENCOUNTER — Telehealth: Payer: Self-pay | Admitting: Gastroenterology

## 2014-07-26 NOTE — Telephone Encounter (Signed)
Please call pt. HER stomach Bx shows gastritis FROM ASA.   TAKE OMEPRAZOLE 30 MINUTES PRIOR TO your first MEAL ONCE DAILY FORever. TAKE A PROBIOTIC DAILY. STOP IT IF YOU FEEL IT'S NOT MAKING A DIFFERENCE after 4 mos.  FOLLOW A LOW FAT DIET.   FOLLOW UP IN 6 MOS E15 SLF GASTRIC ULCER/GASTRITIS/CONSTIPATION.

## 2014-07-26 NOTE — Telephone Encounter (Signed)
Pt is aware of results. 

## 2014-07-26 NOTE — Telephone Encounter (Signed)
ON RECALL LIST  °

## 2014-08-10 ENCOUNTER — Telehealth: Payer: Self-pay | Admitting: Family Medicine

## 2014-08-10 ENCOUNTER — Ambulatory Visit (INDEPENDENT_AMBULATORY_CARE_PROVIDER_SITE_OTHER): Payer: Medicare Other | Admitting: Family Medicine

## 2014-08-10 ENCOUNTER — Encounter: Payer: Self-pay | Admitting: Family Medicine

## 2014-08-10 VITALS — BP 120/82 | HR 92 | Resp 16 | Ht 66.0 in | Wt 217.0 lb

## 2014-08-10 DIAGNOSIS — R7309 Other abnormal glucose: Secondary | ICD-10-CM

## 2014-08-10 DIAGNOSIS — E8881 Metabolic syndrome: Secondary | ICD-10-CM

## 2014-08-10 DIAGNOSIS — J3089 Other allergic rhinitis: Secondary | ICD-10-CM | POA: Diagnosis not present

## 2014-08-10 DIAGNOSIS — E785 Hyperlipidemia, unspecified: Secondary | ICD-10-CM

## 2014-08-10 DIAGNOSIS — R7303 Prediabetes: Secondary | ICD-10-CM

## 2014-08-10 DIAGNOSIS — J410 Simple chronic bronchitis: Secondary | ICD-10-CM | POA: Diagnosis not present

## 2014-08-10 DIAGNOSIS — I10 Essential (primary) hypertension: Secondary | ICD-10-CM

## 2014-08-10 DIAGNOSIS — M545 Low back pain, unspecified: Secondary | ICD-10-CM

## 2014-08-10 DIAGNOSIS — E669 Obesity, unspecified: Secondary | ICD-10-CM

## 2014-08-10 DIAGNOSIS — K219 Gastro-esophageal reflux disease without esophagitis: Secondary | ICD-10-CM

## 2014-08-10 MED ORDER — PRAVASTATIN SODIUM 40 MG PO TABS: 40.0000 mg | ORAL_TABLET | Freq: Every day | ORAL | Status: DC

## 2014-08-10 MED ORDER — HYDROCHLOROTHIAZIDE 25 MG PO TABS: 25.0000 mg | ORAL_TABLET | Freq: Every day | ORAL | Status: DC

## 2014-08-10 NOTE — Patient Instructions (Signed)
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

## 2014-08-10 NOTE — Progress Notes (Signed)
Subjective:    Patient ID: [name], female    DOB: 1941-05-05, 74 y.o.   MRN: [id number]  HPI The PT is here for follow up and re-evaluation of chronic medical conditions, medication management and review of any available recent lab and radiology data.  Preventive health is updated, specifically  Cancer screening and Immunization.   Has upcoming appt with ortho re low back pain primarily over the SI joints, worse after falling into a gully a;lso lifting/moving her freezer 10 day h/o bilateral lower  Back pain localized over both SI joints, non radiating, then this past Thursday she fell into a gully at her [place], while trying to pick up a trash can      Review of Systems See HPI Denies recent fever or chills. Denies sinus pressure, nasal congestion, ear pain or sore throat. Denies chest congestion, productive cough or wheezing. Denies chest pains, palpitations and leg swelling Denies abdominal pain, nausea, vomiting,diarrhea or constipation.   Denies dysuria, frequency, hesitancy or incontinence.  Denies headaches, seizures, Denies depression, anxiety or insomnia. Denies skin break down or rash.        Objective:   Physical Exam BP 120/82 mmHg  Pulse 92  Resp 16  Ht 5\' 6"  (1.676 m)  Wt 217 lb (98.431 kg)  BMI 35.04 kg/m2  SpO2 96% Patient alert and oriented and in no cardiopulmonary distress.Pt i pain  HEENT: No facial asymmetry, EOMI,   oropharynx pink and moist.  Neck supple no JVD, no mass.  Chest: decreased though adequate air entry, scattered crackles, no wheezes.  CVS: S1, S2 no murmurs, no S3.Regular rate.  ABD: Soft non tender.   Ext: No edema  MS: decreased ROM lumbar spine, adequate in  shoulders, hips and knees.  Skin: Intact, no ulcerations or rash noted.  Psych: Good eye contact, normal affect. Memory intact not anxious or depressed appearing.  CNS: CN 2-12 intact, power,  normal throughout.no focal deficits noted.          Assessment & Plan:  Essential hypertension Controlled, no change in medication DASH diet and commitment to daily physical activity for a minimum of 30 minutes discussed and encouraged, as a part of hypertension management. The importance of attaining a healthy weight is also discussed.    Allergic rhinitis Increased symptoms with weather change, but controlled on current meds   COPD (chronic obstructive pulmonary disease) Increased wheezing, unable to afford advair will call in with name of alternative med she reports being provided by her ins company, will substitute if appropriate   Prediabetes Unchanged Patient educated about the importance of limiting  Carbohydrate intake , the need to commit to daily physical activity for a minimum of 30 minutes , and to commit weight loss. The fact that changes in all these areas will reduce or eliminate all together the development of diabetes is stressed.      Hyperlipidemia LDL goal <100 Deteriorated Hyperlipidemia:Low fat diet discussed and encouraged.      Low back pain Increased and uncontrolled Pt has already made an appt to see ortho, pain is primarily over I joints, will leave ortho to treat Reviewed home safety and avoidance of excess strenuous work   Obesity Deteriorated. Patient re-educated about  the importance of commitment to a  minimum of 150 minutes of exercise per week. The importance of healthy food choices with portion control discussed. Encouraged to start a food diary, count calories and to consider  joining a support group. Sample diet  sheets offered. Goals set by the patient for the next several months.      GERD (gastroesophageal reflux disease) Controlled, no change in medication    Metabolic syndrome X The increased risk of cardiovascular disease associated with this diagnosis, and the need to consistently work on lifestyle to change this is discussed. Following  a  heart healthy diet  ,commitment to 30 minutes of exercise at least 5 days per week, as well as control of blood sugar and cholesterol , and achieving a healthy weight are all the areas to be addressed .

## 2014-08-10 NOTE — Telephone Encounter (Signed)
pls call pt let her know that acetylcysteine is niot similar to advair and will not have the same effect, hardly ever used also Pls send in symbicort 80/4/5 or see if dulera is covered, send me a list of options from the ins company pls  Explain to pt this is being worked on pls

## 2014-08-12 ENCOUNTER — Encounter: Payer: Self-pay | Admitting: Orthopedic Surgery

## 2014-08-12 ENCOUNTER — Ambulatory Visit (INDEPENDENT_AMBULATORY_CARE_PROVIDER_SITE_OTHER): Payer: Medicare Other | Admitting: Orthopedic Surgery

## 2014-08-12 ENCOUNTER — Ambulatory Visit (INDEPENDENT_AMBULATORY_CARE_PROVIDER_SITE_OTHER): Payer: Medicare Other

## 2014-08-12 VITALS — BP 120/65 | Ht 66.0 in | Wt 217.0 lb

## 2014-08-12 DIAGNOSIS — M5440 Lumbago with sciatica, unspecified side: Secondary | ICD-10-CM

## 2014-08-12 DIAGNOSIS — S39012A Strain of muscle, fascia and tendon of lower back, initial encounter: Secondary | ICD-10-CM

## 2014-08-12 NOTE — Progress Notes (Signed)
Patient ID: [name], female   DOB: April 11, 1941, 74 y.o.   MRN: [id number]  Chief Complaint  Patient presents with  . Back Pain    Back pain, no injury.     HPI [name] is a 74 y.o. female.  The patient was moving a freezer and the next day she felt pain across her lower back no leg pain. Just 9 out of 10 lower back pain not controlled with Aleve worse with walking standing or sitting too long. HPI  Review of Systems Review of Systems No change in bowel or bladder function    Past Medical History  Diagnosis Date  . Sinusitis   . OA (osteoarthritis) of knee   . Nicotine addiction   . COPD (chronic obstructive pulmonary disease)   . Obesity   . Hypertension   . Lung nodule seen on imaging study     Past Surgical History  Procedure Laterality Date  . Knee arthroscopy right    . Cholecystectomy    . Total knee arthroplasty right  05/29/2005    Dr. Aline Brochure   . Bilateral cataract surgery  7/27 & 03/01/09    Dr. Gershon Crane    . Partial hysterectomy    . Esophagogastroduodenoscopy  11/07/2010    Jenkins:hiatal hernia/no evidence of Barrett esophagitis.  The Z-line was noted to be at 39 cm from the teeth. CLO test negative  . Colonoscopy  04/2004    Dr. Leane Para Smith-->melanosis coli  . Colonoscopy with esophagogastroduodenoscopy (egd) N/A 02/25/2013    Dr. Fields:Normal mucosa in the terminal ileum/Severe melanosis throughout the entire examined colon/ONE COLON POLYP REMOVED (tubular adenoma)/Mild diverticulosis  in the sigmoid colon/EGD:The mucosa of the esophagus appeared normal/Non-erosive gastritis, empiric dilation with Savary dilator  . Savory dilation N/A 02/25/2013    Procedure: SAVORY DILATION;  Surgeon: Danie Binder, MD;  Location: [facility];  Service: Endoscopy;  Laterality: N/A;  Venia Minks dilation N/A 02/25/2013    Procedure: Venia Minks DILATION;  Surgeon: Danie Binder, MD;  Location: [facility];  Service: Endoscopy;  Laterality: N/A;  .  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 bx=focal erosion with inflammation and fibrosis  . Left heart catheterization with coronary angiogram N/A 04/08/2014    Procedure: LEFT HEART CATHETERIZATION WITH CORONARY ANGIOGRAM;  Surgeon: Sinclair Grooms, MD;  Location: [facility] CATH LAB;  Service: Cardiovascular;  Laterality: N/A;  . Esophagogastroduodenoscopy N/A 07/06/2014    Procedure: ESOPHAGOGASTRODUODENOSCOPY (EGD);  Surgeon: Danie Binder, MD;  Location: [facility];  Service: Endoscopy;  Laterality: N/A;    Family History  Problem Relation Age of Onset  . Diabetes Mother   . Hypertension Mother   . Coronary artery disease Mother   . Pneumonia Father   . Hypertension Sister   . Hypertension Sister   . Hypertension Brother   . Hypertension Brother   . Colon cancer Neg Hx   . Liver disease Neg Hx     Social History History  Substance Use Topics  . Smoking status: Current Some Day Smoker -- 0.30 packs/day for 10 years    Types: Cigarettes    Start date: 07/23/1962  . Smokeless tobacco: Never Used     Comment: smokes 1 or 2 per day  . Alcohol Use: No    No Known Allergies  Current Outpatient Prescriptions  Medication Sig Dispense Refill  . aspirin EC 81 MG tablet Take 81 mg  by mouth daily.    . Calcium Carbonate-Vitamin D (CALCIUM + D PO) Take 1 tablet by mouth daily.    . hydrochlorothiazide (HYDRODIURIL) 25 MG tablet Take 1 tablet (25 mg total) by mouth daily. 30 tablet 3  . omeprazole (PRILOSEC OTC) 20 MG tablet 1 po 30 mins prior to breakfast forever 30 tablet 11  . pravastatin (PRAVACHOL) 40 MG tablet Take 1 tablet (40 mg total) by mouth at bedtime. 30 tablet 3  . ALOE VERA PO Take 500 mg by mouth daily.    . fluticasone (FLONASE) 50 MCG/ACT nasal spray Place 2 sprays into both nostrils daily as needed for allergies or rhinitis.    . GuaiFENesin (MUCUS RELIEF PO) Take 400 mg by  mouth daily.    . sodium chloride (OCEAN) 0.65 % SOLN nasal spray Place 1 spray into both nostrils as needed for congestion.     No current facility-administered medications for this visit.       Physical Exam Blood pressure 120/65, height 5\' 6"  (1.676 m), weight 217 lb (98.431 kg). Physical Exam She is tender across the small of her back. She has symmetric and normal range of motion in both hips with normal stability strength. Skin intact no tenderness in the hip area distal pulses are normal and symmetric sensation normal and symmetric straight leg raise is negative reflexes normal.  Data Reviewed Imaging in dependent interpretation of our office film shows that she has no acute fracture but degenerative disc disease moderate L1-S1  Assessment    Encounter Diagnoses  Name Primary?  . Low back pain with sciatica, sciatica laterality unspecified, unspecified back pain laterality   . Back strain, initial encounter Yes        Plan    Ibuprofen and Robaxin follow-up as needed

## 2014-08-12 NOTE — Patient Instructions (Signed)
Back Pain, Adult Low back pain is very common. About 1 in 5 people have back pain.The cause of low back pain is rarely dangerous. The pain often gets better over time.About half of people with a sudden onset of back pain feel better in just 2 weeks. About 8 in 10 people feel better by 6 weeks.  CAUSES Some common causes of back pain include:  Strain of the muscles or ligaments supporting the spine.  Wear and tear (degeneration) of the spinal discs.  Arthritis.  Direct injury to the back. DIAGNOSIS Most of the time, the direct cause of low back pain is not known.However, back pain can be treated effectively even when the exact cause of the pain is unknown.Answering your caregiver's questions about your overall health and symptoms is one of the most accurate ways to make sure the cause of your pain is not dangerous. If your caregiver needs more information, he or she may order lab work or imaging tests (X-rays or MRIs).However, even if imaging tests show changes in your back, this usually does not require surgery. HOME CARE INSTRUCTIONS For many people, back pain returns.Since low back pain is rarely dangerous, it is often a condition that people can learn to manageon their own.   Remain active. It is stressful on the back to sit or stand in one place. Do not sit, drive, or stand in one place for more than 30 minutes at a time. Take short walks on level surfaces as soon as pain allows.Try to increase the length of time you walk each day.  Do not stay in bed.Resting more than 1 or 2 days can delay your recovery.  Do not avoid exercise or work.Your body is made to move.It is not dangerous to be active, even though your back may hurt.Your back will likely heal faster if you return to being active before your pain is gone.  Pay attention to your body when you bend and lift. Many people have less discomfortwhen lifting if they bend their knees, keep the load close to their bodies,and  avoid twisting. Often, the most comfortable positions are those that put less stress on your recovering back.  Find a comfortable position to sleep. Use a firm mattress and lie on your side with your knees slightly bent. If you lie on your back, put a pillow under your knees.  Only take over-the-counter or prescription medicines as directed by your caregiver. Over-the-counter medicines to reduce pain and inflammation are often the most helpful.Your caregiver may prescribe muscle relaxant drugs.These medicines help dull your pain so you can more quickly return to your normal activities and healthy exercise.  Put ice on the injured area.  Put ice in a plastic bag.  Place a towel between your skin and the bag.  Leave the ice on for 15-20 minutes, 03-04 times a day for the first 2 to 3 days. After that, ice and heat may be alternated to reduce pain and spasms.  Ask your caregiver about trying back exercises and gentle massage. This may be of some benefit.  Avoid feeling anxious or stressed.Stress increases muscle tension and can worsen back pain.It is important to recognize when you are anxious or stressed and learn ways to manage it.Exercise is a great option. SEEK MEDICAL CARE IF:  You have pain that is not relieved with rest or medicine.  You have pain that does not improve in 1 week.  You have new symptoms.  You are generally not feeling well. SEEK   IMMEDIATE MEDICAL CARE IF:   You have pain that radiates from your back into your legs.  You develop new bowel or bladder control problems.  You have unusual weakness or numbness in your arms or legs.  You develop nausea or vomiting.  You develop abdominal pain.  You feel faint. Document Released: 07/09/2005 Document Revised: 01/08/2012 Document Reviewed: 11/10/2013 ExitCare Patient Information 2015 [employer]. This information is not intended to replace advice given to you by your health care provider. Make sure you  discuss any questions you have with your health care provider.  

## 2014-08-14 NOTE — Assessment & Plan Note (Signed)
Controlled, no change in medication DASH diet and commitment to daily physical activity for a minimum of 30 minutes discussed and encouraged, as a part of hypertension management. The importance of attaining a healthy weight is also discussed.  

## 2014-08-14 NOTE — Assessment & Plan Note (Signed)
The increased risk of cardiovascular disease associated with this diagnosis, and the need to consistently work on lifestyle to change this is discussed. Following  a  heart healthy diet ,commitment to 30 minutes of exercise at least 5 days per week, as well as control of blood sugar and cholesterol , and achieving a healthy weight are all the areas to be addressed .  

## 2014-08-14 NOTE — Assessment & Plan Note (Signed)
Increased and uncontrolled Pt has already made an appt to see ortho, pain is primarily over I joints, will leave ortho to treat Reviewed home safety and avoidance of excess strenuous work

## 2014-08-14 NOTE — Assessment & Plan Note (Signed)
Increased wheezing, unable to afford advair will call in with name of alternative med she reports being provided by her ins company, will substitute if appropriate

## 2014-08-14 NOTE — Assessment & Plan Note (Signed)
Unchanged Patient educated about the importance of limiting  Carbohydrate intake , the need to commit to daily physical activity for a minimum of 30 minutes , and to commit weight loss. The fact that changes in all these areas will reduce or eliminate all together the development of diabetes is stressed.    

## 2014-08-14 NOTE — Assessment & Plan Note (Signed)
Increased symptoms with weather change, but controlled on current meds

## 2014-08-14 NOTE — Assessment & Plan Note (Signed)
Controlled, no change in medication  

## 2014-08-14 NOTE — Assessment & Plan Note (Signed)
Deteriorated Hyperlipidemia:Low fat diet discussed and encouraged.   

## 2014-08-14 NOTE — Assessment & Plan Note (Signed)
Deteriorated. Patient re-educated about  the importance of commitment to a  minimum of 150 minutes of exercise per week. The importance of healthy food choices with portion control discussed. Encouraged to start a food diary, count calories and to consider  joining a support group. Sample diet sheets offered. Goals set by the patient for the next several months.    

## 2014-08-19 MED ORDER — BUDESONIDE-FORMOTEROL FUMARATE 80-4.5 MCG/ACT IN AERO: 2.0000 | INHALATION_SPRAY | Freq: Two times a day (BID) | RESPIRATORY_TRACT | Status: DC

## 2014-08-19 NOTE — Addendum Note (Signed)
Addended by: Denman George B on: 08/19/2014 10:42 AM   Modules accepted: Orders

## 2014-08-19 NOTE — Telephone Encounter (Signed)
Called and spoke with patient and she is aware of change in inhaler.  Rx sent to pharmacy with savings coupon.   Will notify office if copay is too much after free supply

## 2014-08-23 ENCOUNTER — Telehealth: Payer: Self-pay | Admitting: Family Medicine

## 2014-08-24 MED ORDER — MOMETASONE FURO-FORMOTEROL FUM 100-5 MCG/ACT IN AERO: 2.0000 | INHALATION_SPRAY | Freq: Two times a day (BID) | RESPIRATORY_TRACT | Status: DC

## 2014-08-24 NOTE — Telephone Encounter (Signed)
Yes, will enter and send

## 2014-08-24 NOTE — Telephone Encounter (Signed)
Patient is referring to Symbicort.  Would you like to try [facility]?

## 2014-08-24 NOTE — Telephone Encounter (Signed)
Called and notified patient of change in inhaler.

## 2014-10-21 ENCOUNTER — Other Ambulatory Visit: Payer: Self-pay

## 2014-10-21 MED ORDER — HYDROCHLOROTHIAZIDE 25 MG PO TABS: 25.0000 mg | ORAL_TABLET | Freq: Every day | ORAL | Status: DC

## 2014-12-27 DIAGNOSIS — R7309 Other abnormal glucose: Secondary | ICD-10-CM | POA: Diagnosis not present

## 2014-12-27 DIAGNOSIS — E785 Hyperlipidemia, unspecified: Secondary | ICD-10-CM | POA: Diagnosis not present

## 2014-12-27 DIAGNOSIS — I10 Essential (primary) hypertension: Secondary | ICD-10-CM | POA: Diagnosis not present

## 2014-12-28 LAB — COMPREHENSIVE METABOLIC PANEL
ALBUMIN: 4.2 g/dL (ref 3.5–5.2)
ALT: 11 U/L (ref 0–35)
AST: 15 U/L (ref 0–37)
Alkaline Phosphatase: 104 U/L (ref 39–117)
BILIRUBIN TOTAL: 0.9 mg/dL (ref 0.2–1.2)
BUN: 11 mg/dL (ref 6–23)
CO2: 30 mEq/L (ref 19–32)
Calcium: 9.1 mg/dL (ref 8.4–10.5)
Chloride: 102 mEq/L (ref 96–112)
Creat: 0.84 mg/dL (ref 0.50–1.10)
Glucose, Bld: 113 mg/dL — ABNORMAL HIGH (ref 70–99)
Potassium: 4.4 mEq/L (ref 3.5–5.3)
Sodium: 140 mEq/L (ref 135–145)
Total Protein: 7.2 g/dL (ref 6.0–8.3)

## 2014-12-28 LAB — LIPID PANEL
Cholesterol: 174 mg/dL (ref 0–200)
HDL: 44 mg/dL — ABNORMAL LOW (ref >=46)
LDL Cholesterol: 112 mg/dL — ABNORMAL HIGH (ref 0–99)
TRIGLYCERIDES: 89 mg/dL (ref <150)
Total CHOL/HDL Ratio: 4 Ratio
VLDL: 18 mg/dL (ref 0–40)

## 2014-12-28 LAB — HEMOGLOBIN A1C
Hgb A1c MFr Bld: 6.2 % — ABNORMAL HIGH (ref <5.7)
Mean Plasma Glucose: 131 mg/dL — ABNORMAL HIGH (ref <117)

## 2014-12-31 ENCOUNTER — Other Ambulatory Visit: Payer: Self-pay

## 2014-12-31 MED ORDER — PRAVASTATIN SODIUM 40 MG PO TABS: 40.0000 mg | ORAL_TABLET | Freq: Every day | ORAL | Status: DC

## 2015-01-04 ENCOUNTER — Encounter: Payer: Self-pay | Admitting: Gastroenterology

## 2015-01-05 ENCOUNTER — Ambulatory Visit (INDEPENDENT_AMBULATORY_CARE_PROVIDER_SITE_OTHER): Payer: Medicare Other | Admitting: Family Medicine

## 2015-01-05 ENCOUNTER — Encounter: Payer: Self-pay | Admitting: Family Medicine

## 2015-01-05 VITALS — BP 134/80 | HR 90 | Resp 16 | Ht 65.5 in | Wt 218.8 lb

## 2015-01-05 DIAGNOSIS — M5442 Lumbago with sciatica, left side: Secondary | ICD-10-CM | POA: Diagnosis not present

## 2015-01-05 DIAGNOSIS — Z0000 Encounter for general adult medical examination without abnormal findings: Secondary | ICD-10-CM | POA: Diagnosis not present

## 2015-01-05 DIAGNOSIS — J329 Chronic sinusitis, unspecified: Secondary | ICD-10-CM | POA: Insufficient documentation

## 2015-01-05 DIAGNOSIS — J309 Allergic rhinitis, unspecified: Secondary | ICD-10-CM

## 2015-01-05 DIAGNOSIS — J328 Other chronic sinusitis: Secondary | ICD-10-CM

## 2015-01-05 MED ORDER — KETOROLAC TROMETHAMINE 60 MG/2ML IM SOLN
60.0000 mg | Freq: Once | INTRAMUSCULAR | Status: AC
  Administered 2015-01-05: 60 mg via INTRAMUSCULAR

## 2015-01-05 MED ORDER — PENICILLIN V POTASSIUM 500 MG PO TABS: 500.0000 mg | ORAL_TABLET | Freq: Three times a day (TID) | ORAL | Status: DC

## 2015-01-05 MED ORDER — PREDNISONE 5 MG (21) PO TBPK: 5.0000 mg | ORAL_TABLET | ORAL | Status: DC

## 2015-01-05 MED ORDER — IBUPROFEN 800 MG PO TABS: ORAL_TABLET | ORAL | Status: DC

## 2015-01-05 MED ORDER — MONTELUKAST SODIUM 10 MG PO TABS: 10.0000 mg | ORAL_TABLET | Freq: Every day | ORAL | Status: DC

## 2015-01-05 MED ORDER — METHYLPREDNISOLONE ACETATE 80 MG/ML IJ SUSP
80.0000 mg | Freq: Once | INTRAMUSCULAR | Status: AC
  Administered 2015-01-05: 80 mg via INTRAMUSCULAR

## 2015-01-05 NOTE — Progress Notes (Signed)
Subjective:    Patient ID: [name], female    DOB: [birth year], 74 y.o.   MRN: [id number]  HPI Preventive Screening-Counseling & Management   Patient present here today for a Medicare annual wellness visit. 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 No  Over the past two weeks, have you felt little interest or pleasure in doing things? No  Have you lost interest or pleasure in daily life? No  Do you often feel hopeless? No  Do you cry easily over simple problems? No   Activities of Daily Living  In your present state of health, do you have any difficulty performing the following activities?  Driving?: No Managing money?: No Feeding yourself?:No Getting from bed to chair?:yes at times Climbing a flight of stairs?:yes Preparing food and eating?:No Bathing or showering?:No Getting dressed?:No Getting to the toilet?:No Using the toilet?:No Moving around from place to place?: yes at  times due to low back pain radiaiting to left lower extremity  Fall Risk Assessment In the past year have you fallen or had a near fall?:No Are you currently taking any medications that make you dizzy?:No   Hearing Difficulties: No Do you often ask people to speak up or repeat themselves?:No Do you experience ringing or noises in your ears?:No Do you have difficulty understanding soft or whispered voices?:No  Cognitive Testing  Alert? Yes Normal Appearance?Yes  Oriented to person? Yes Place? Yes  Time? Yes  Displays appropriate judgment?Yes  Can read the correct time from a watch face? yes Are you having problems remembering things?No  Advanced Directives have been discussed with the patient?Yes, brochure given  , full code   List the Names of Other Physician/Practitioners you currently use:  Dr Oneida Alar (GI)   Indicate any recent Medical Services you may have received from other than [facility] providers in the past year (date may be approximate).   Assessment:    Annual Wellness Exam   Plan:      Medicare Attestation  I have personally reviewed:  The patient's medical and social history  Their use of alcohol, tobacco or illicit drugs  Their current medications and supplements  The patient's functional ability including ADLs,fall risks, home safety risks, cognitive, and hearing and visual impairment  Diet and physical activities  Evidence for depression or mood disorders  The patient's weight, height, BMI, and visual acuity have been recorded in the chart. I have made referrals, counseling, and provided  education to the patient based on review of the above and I have provided the patient with a written personalized care plan for preventive services.      Review of Systems     Objective:   Physical Exam BP 134/80 mmHg  Pulse 90  Resp 16  Ht 5' 5.5" (1.664 m)  Wt 218 lb 12.8 oz (99.247 kg)  BMI 35.84 kg/m2  SpO2 99%   HEENT: bilateral maxillary sinus tenderness,  TM clear, oropharynx no erythema , neck supple no adenopathy or mass  Chest adequate though reduced air entry, no crackles or wheezes  MS: decreased ROM lumbar spine abnormal gait, limp favoring left leg       Assessment & Plan:   Medicare annual wellness visit, subsequent Annual exam as documented. Counseling done  re healthy lifestyle involving commitment to 150 minutes exercise per week, heart healthy diet, and attaining healthy weight.The importance of adequate sleep also discussed. Regular seat belt use and home safety, is also discussed. Changes in health habits are decided on by the patient with goals and time frames  set for achieving them. Immunization and cancer screening needs are specifically addressed at this visit.   Low back pain Uncontrolled.Toradol and depo medrol administered IM in the office , to be followed by a short course of oral prednisone and NSAIDS.   Sinusitis, chronic Symptomatic for over 10 days with abnormal exam, penicillin prescribed for 10 day course

## 2015-01-05 NOTE — Patient Instructions (Addendum)
Annual physical exam in 4.5 month, call if you need me before  Pls cut back on cheese, and fatty foods, sugar is better which is great, and blood pressure is great  You are treated today for back pain, allergic rhinitis and chronic sinusitis  Thanks for choosing [facility], we consider it a privelige to serve you. Marland Kitchen.Please work on good  health habits so that your health will improve. 1. Commitment to daily physical activity for 30 to 60  minutes, if you are able to do this.  2. Commitment to wise food choices. Aim for half of your  food intake to be vegetable and fruit, one quarter starchy foods, and one quarter protein. Try to eat on a regular schedule  3 meals per day, snacking between meals should be limited to vegetables or fruits or small portions of nuts. 64 ounces of water per day is generally recommended, unless you have specific health conditions, like heart failure or kidney failure where you will need to limit fluid intake.  3. Commitment to sufficient and a  good quality of physical and mental rest daily, generally between 6 to 8 hours per day.  WITH PERSISTANCE AND PERSEVERANCE, THE IMPOSSIBLE , BECOMES THE NORM!

## 2015-01-05 NOTE — Assessment & Plan Note (Signed)
Uncontrolled.Toradol and depo medrol administered IM in the office , to be followed by a short course of oral prednisone and NSAIDS.  

## 2015-01-05 NOTE — Assessment & Plan Note (Signed)

## 2015-01-23 ENCOUNTER — Encounter: Payer: Self-pay | Admitting: Family Medicine

## 2015-01-23 NOTE — Assessment & Plan Note (Signed)
Symptomatic for over 10 days with abnormal exam, penicillin prescribed for 10 day course

## 2015-02-02 ENCOUNTER — Telehealth: Payer: Self-pay | Admitting: *Deleted

## 2015-02-02 MED ORDER — MECLIZINE HCL 25 MG PO TABS: 25.0000 mg | ORAL_TABLET | Freq: Three times a day (TID) | ORAL | Status: DC | PRN

## 2015-02-02 NOTE — Telephone Encounter (Signed)
Patient aware.  Will call back if she feels that she need to be referred.

## 2015-02-02 NOTE — Telephone Encounter (Signed)
Advise sudafed  One daily and send antivert 25 mg one 3 times daily as needed !5 no refill, offer ENT eval as often has ear complaints

## 2015-02-02 NOTE — Telephone Encounter (Signed)
Pt called requesting to speak to the nurse and want's the nurse to call her back. Please advise [phone]

## 2015-02-02 NOTE — Telephone Encounter (Signed)
Called and spoke with patient and she states that she has had problems with right ear.  She has some drainage in canal in the am.  She is also having dizziness.  Please advise.

## 2015-02-02 NOTE — Addendum Note (Signed)
Addended by: Denman George B on: 02/02/2015 03:23 PM   Modules accepted: Orders

## 2015-02-09 ENCOUNTER — Other Ambulatory Visit: Payer: Self-pay | Admitting: Family Medicine

## 2015-02-09 DIAGNOSIS — Z1231 Encounter for screening mammogram for malignant neoplasm of breast: Secondary | ICD-10-CM

## 2015-02-10 ENCOUNTER — Ambulatory Visit (INDEPENDENT_AMBULATORY_CARE_PROVIDER_SITE_OTHER): Payer: Medicare Other | Admitting: Family Medicine

## 2015-02-10 ENCOUNTER — Ambulatory Visit (HOSPITAL_COMMUNITY)
Admission: RE | Admit: 2015-02-10 | Discharge: 2015-02-10 | Disposition: A | Discharge status: Home / Self Care | Payer: Medicare Other | Source: Ambulatory Visit | Attending: Family Medicine | Admitting: Family Medicine

## 2015-02-10 ENCOUNTER — Encounter: Payer: Self-pay | Admitting: Family Medicine

## 2015-02-10 VITALS — BP 140/82 | HR 90 | Resp 16 | Ht 65.5 in | Wt 211.0 lb

## 2015-02-10 DIAGNOSIS — F17219 Nicotine dependence, cigarettes, with unspecified nicotine-induced disorders: Secondary | ICD-10-CM

## 2015-02-10 DIAGNOSIS — R42 Dizziness and giddiness: Secondary | ICD-10-CM

## 2015-02-10 DIAGNOSIS — J329 Chronic sinusitis, unspecified: Secondary | ICD-10-CM | POA: Insufficient documentation

## 2015-02-10 DIAGNOSIS — R0989 Other specified symptoms and signs involving the circulatory and respiratory systems: Secondary | ICD-10-CM | POA: Insufficient documentation

## 2015-02-10 DIAGNOSIS — Z1231 Encounter for screening mammogram for malignant neoplasm of breast: Secondary | ICD-10-CM | POA: Diagnosis not present

## 2015-02-10 DIAGNOSIS — I10 Essential (primary) hypertension: Secondary | ICD-10-CM

## 2015-02-10 DIAGNOSIS — F17210 Nicotine dependence, cigarettes, uncomplicated: Secondary | ICD-10-CM | POA: Diagnosis not present

## 2015-02-10 DIAGNOSIS — Z720 Tobacco use: Secondary | ICD-10-CM

## 2015-02-10 DIAGNOSIS — K2100 Gastro-esophageal reflux disease with esophagitis, without bleeding: Secondary | ICD-10-CM

## 2015-02-10 DIAGNOSIS — F17200 Nicotine dependence, unspecified, uncomplicated: Secondary | ICD-10-CM

## 2015-02-10 DIAGNOSIS — H9201 Otalgia, right ear: Secondary | ICD-10-CM

## 2015-02-10 MED ORDER — FLUTICASONE PROPIONATE 50 MCG/ACT NA SUSP: 2.0000 | Freq: Every day | NASAL | Status: DC | PRN

## 2015-02-10 NOTE — Assessment & Plan Note (Addendum)
Light headed  With change in position, needd [place] to further evaluate

## 2015-02-10 NOTE — Assessment & Plan Note (Signed)

## 2015-02-10 NOTE — Assessment & Plan Note (Signed)
Intermittent right otalgia for past 1 year, worse in the past 1 month,  Need ENT eval , also eval for vertigo

## 2015-02-10 NOTE — Patient Instructions (Addendum)
F/u as before  You are referred for sinus xray, carotid ultrasound to check circulation to brain and to ENT  No new medications   Flonase will be refilled  Thanks for choosing [facility], we consider it a privelige to serve you.   PLEASE stop the 2 cigarettes

## 2015-03-01 ENCOUNTER — Other Ambulatory Visit: Payer: Self-pay | Admitting: Family Medicine

## 2015-03-09 ENCOUNTER — Telehealth: Payer: Self-pay | Admitting: *Deleted

## 2015-03-09 NOTE — Telephone Encounter (Signed)
Patient aware its ok

## 2015-03-09 NOTE — Telephone Encounter (Signed)
Pt called stating a woman offered her some gluscoma its like ensure per pt. Pt said she is not diabetic and she wants to know if it is okay to drink. Please advise

## 2015-03-23 ENCOUNTER — Encounter: Payer: Self-pay | Admitting: Gastroenterology

## 2015-03-23 ENCOUNTER — Ambulatory Visit (INDEPENDENT_AMBULATORY_CARE_PROVIDER_SITE_OTHER): Payer: Medicare Other | Admitting: Gastroenterology

## 2015-03-23 VITALS — BP 151/95 | HR 104 | Temp 97.4°F | Ht 66.0 in | Wt 215.2 lb

## 2015-03-23 DIAGNOSIS — K449 Diaphragmatic hernia without obstruction or gangrene: Secondary | ICD-10-CM

## 2015-03-23 DIAGNOSIS — K219 Gastro-esophageal reflux disease without esophagitis: Secondary | ICD-10-CM

## 2015-03-23 DIAGNOSIS — K5901 Slow transit constipation: Secondary | ICD-10-CM | POA: Diagnosis not present

## 2015-03-23 HISTORY — DX: Diaphragmatic hernia without obstruction or gangrene: K44.9

## 2015-03-23 MED ORDER — LUBIPROSTONE 24 MCG PO CAPS: 24.0000 ug | ORAL_CAPSULE | Freq: Two times a day (BID) | ORAL | Status: DC

## 2015-03-23 NOTE — Progress Notes (Signed)
cc'ed to pcp °

## 2015-03-23 NOTE — Patient Instructions (Addendum)
DRINK WATER TO KEEP YOUR URINE LIGHT YELLOW.  CONTINUE A HIGH FIBER DIET.  CONTINUE YOUR WEIGHT LOSS EFFORTS. Lose 15 lbs.  CONTINUE OMEPRAZOLE.  TAKE 30 MINUTES PRIOR TO YOUR FIRST MEAL.  ADD AMITIZA ONE PILL AT BEDTIME FOR 7 DAYS THEN ONE TWICE DAILY WITH FOOD. IT MAY CAUSE NAUSEA.  KEEP THE LINZESS 290 TO USE AS NEEDED.  FOLLOW UP IN 4 MOS.

## 2015-03-23 NOTE — Progress Notes (Signed)
Subjective:    Patient ID: [name], female    DOB: 12/30/1940, 74 y.o.   MRN: [id number]  Laura Nakayama, MD  HPI FELT HIS PRESSURE AND FEEL LIKE SOMETHING MOVING UP AND DOWN. SYMPTOMS: FOR ONE MO M BUT GOTTEN WORSE. ASSOCIATED WITH  DECREASED EXERCISE BECAUSE IT'S TOO HOT & HAVING INNER EAR PROBLEM. 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  LINZESS WORKS BUT MEDICARE WON'T COVER-$200 CO-[employer]. FEEL PRESSURE IN UPPER ABDOMEN AND GETS BETTER IF SHE HAS A GOOD BM. BMs: W/O LINZESS HAS TO TAKE TO MAKE THEM MOVE. ALOE VERA HELPS THEM MOVE NORMALLY. TAKING OMEPRAZOLE AND IT HELPS. MAY FEEL SOB IF HERNIA/BOWEL PUSHING UP.  PT DENIES FEVER, CHILLS, HEMATOCHEZIA, nausea, vomiting, melena, diarrhea, CHEST PAIN, CHANGE IN BOWEL IN HABITS, problems swallowing, OR heartburn or indigestion.   Past Medical History  Diagnosis Date  . Sinusitis   . OA (osteoarthritis) of knee   . Nicotine addiction   . COPD (chronic obstructive pulmonary disease)   . Obesity   . Lung nodule seen on imaging study   . Hypertension 2004   Past Surgical History  Procedure Laterality Date  . Knee arthroscopy right    . Cholecystectomy    . Total knee arthroplasty right  05/29/2005    Dr. Aline Brochure   . Bilateral cataract surgery  7/27 & 03/01/09    Dr. Gershon Crane    . Partial hysterectomy    . Esophagogastroduodenoscopy  11/07/2010    Jenkins:hiatal hernia/no evidence of Barrett esophagitis.  The Z-line was noted to be at 39 cm from the teeth. CLO test negative  . Colonoscopy  04/2004    Dr. Leane Para Smith-->melanosis coli  . Colonoscopy with esophagogastroduodenoscopy (egd) N/A 02/25/2013    Dr. Fields:Normal mucosa in the terminal ileum/Severe melanosis throughout the entire examined colon/ONE COLON POLYP REMOVED (tubular adenoma)/Mild diverticulosis  in the sigmoid colon/EGD:The mucosa of the esophagus appeared normal/Non-erosive gastritis, empiric dilation with Savary dilator  . Savory  dilation N/A 02/25/2013    Procedure: SAVORY DILATION;  Surgeon: Danie Binder, MD;  Location: [facility];  Service: Endoscopy;  Laterality: N/A;  Venia Minks dilation N/A 02/25/2013    Procedure: Venia Minks DILATION;  Surgeon: Danie Binder, MD;  Location: [facility];  Service: Endoscopy;  Laterality: N/A;  . 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 bx=focal erosion with inflammation and fibrosis  . Left heart catheterization with coronary angiogram N/A 04/08/2014    Procedure: LEFT HEART CATHETERIZATION WITH CORONARY ANGIOGRAM;  Surgeon: Sinclair Grooms, MD;  Location: [facility] CATH LAB;  Service: Cardiovascular;  Laterality: N/A;  . Esophagogastroduodenoscopy N/A 07/06/2014    Procedure: ESOPHAGOGASTRODUODENOSCOPY (EGD);  Surgeon: Danie Binder, MD;  Location: [facility];  Service: Endoscopy;  Laterality: N/A;    No Known Allergies  Current Outpatient Prescriptions  Medication Sig Dispense Refill  . ALOE VERA PO Take 500 mg by mouth daily.    Marland Kitchen aspirin EC 81 MG tablet Take 81 mg by mouth daily.    . Calcium Carbonate-Vitamin D (CALCIUM + D PO) Take 1 tablet by mouth daily.    . fluticasone (FLONASE) 50 MCG/ACT nasal spray Place 2 sprays into both nostrils daily as needed for allergies or rhinitis.    . hydrochlorothiazide (HYDRODIURIL) 25 MG tablet TAKE 1 TABLET(25 MG) BY MOUTH DAILY    . ibuprofen (ADVIL,MOTRIN) 800 MG tablet One tablet  twice daily for 1 week    . meclizine (ANTIVERT) 25 MG tablet Take 1 tablet (25 mg total) by mouth 3 (three) times daily as needed for dizziness.    . montelukast (SINGULAIR) 10 MG tablet Take 1 tablet (10 mg total) by mouth at bedtime.    Marland Kitchen omeprazole (PRILOSEC OTC) 20 MG tablet 1 po 30 mins prior to breakfast forever    . pravastatin (PRAVACHOL) 40 MG tablet Take 1 tablet (40 mg total) by mouth at bedtime.    . sodium chloride (OCEAN) 0.65 % SOLN nasal spray  Place 1 spray into both nostrils as needed for congestion.     Review of Systems PER HPI OTHERWISE ALL SYSTEMS ARE NEGATIVE.     Objective:   Physical Exam  Constitutional: She is oriented to person, place, and time. She appears well-developed and well-nourished. No distress.  HENT:  Head: Normocephalic and atraumatic.  Mouth/Throat: Oropharynx is clear and moist. No oropharyngeal exudate.  Eyes: Pupils are equal, round, and reactive to light. No scleral icterus.  Neck: Normal range of motion. Neck supple.  Cardiovascular: Normal rate, regular rhythm and normal heart sounds.   Pulmonary/Chest: Effort normal and breath sounds normal. No respiratory distress.  Abdominal: Soft. Bowel sounds are normal. She exhibits no distension. There is no tenderness.  Musculoskeletal: She exhibits no edema.  Lymphadenopathy:    She has no cervical adenopathy.  Neurological: She is alert and oriented to person, place, and time.  NO FOCAL DEFICITS   Psychiatric: She has a normal mood and affect.  Vitals reviewed.         Assessment & Plan:

## 2015-03-23 NOTE — Progress Notes (Signed)
ON RECALL  °

## 2015-03-23 NOTE — Assessment & Plan Note (Signed)
SYMPTOMS NOT CONTROLLED OFF LINZESS.  DRINK WATER TO KEEP YOUR URINE LIGHT YELLOW.  CONTINUE A HIGH FIBER DIET.  CONTINUE YOUR WEIGHT LOSS EFFORTS. Lose 15 lbs.  ADD AMITIZA ONE PILL AT BEDTIME FOR 7 DAYS THEN ONE TWICE DAILY WITH FOOD. MED SIDE EFFECTS DISCUSSED.  FOLLOW UP IN 4 MOS.

## 2015-03-23 NOTE — Assessment & Plan Note (Addendum)
SYMPTOMS NOT CONTROLLED FOR PAST MO AND ASSOCIATED WITH UPPER ABDOMINAL DISCOMFORT EXACERBATED BY CONSTIPATION AND OBESITY. NO WEIGHT LOSS OR PERSISTENT NAUSEA OR VOMITING.  DRINK WATER TO KEEP YOUR URINE LIGHT YELLOW. CONTINUE A HIGH FIBER DIET. CONTINUE YOUR WEIGHT LOSS EFFORTS. Lose 15 lbs. ADD AMITIZA ONE PILL AT BEDTIME FOR 7 DAYS THEN ONE TWICE DAILY WITH FOOD. MED SIDE EFFECTS DISCUSSED. USE LINZESS IF NEEDED. FOLLOW UP IN 4 MOS.

## 2015-04-03 ENCOUNTER — Other Ambulatory Visit: Payer: Self-pay | Admitting: Family Medicine

## 2015-04-04 ENCOUNTER — Other Ambulatory Visit: Payer: Self-pay | Admitting: Family Medicine

## 2015-04-14 ENCOUNTER — Ambulatory Visit (INDEPENDENT_AMBULATORY_CARE_PROVIDER_SITE_OTHER): Payer: Medicare Other | Admitting: Otolaryngology

## 2015-04-14 DIAGNOSIS — H903 Sensorineural hearing loss, bilateral: Secondary | ICD-10-CM | POA: Diagnosis not present

## 2015-04-14 DIAGNOSIS — J310 Chronic rhinitis: Secondary | ICD-10-CM | POA: Diagnosis not present

## 2015-04-14 DIAGNOSIS — R42 Dizziness and giddiness: Secondary | ICD-10-CM | POA: Diagnosis not present

## 2015-04-14 DIAGNOSIS — J343 Hypertrophy of nasal turbinates: Secondary | ICD-10-CM | POA: Diagnosis not present

## 2015-04-14 DIAGNOSIS — H8111 Benign paroxysmal vertigo, right ear: Secondary | ICD-10-CM | POA: Diagnosis not present

## 2015-04-16 NOTE — Assessment & Plan Note (Signed)
DASH diet and commitment to daily physical activity for a minimum of 30 minutes discussed and encouraged, as a part of hypertension management. The importance of attaining a healthy weight is also discussed. Sub optimal control, no med change at this time  BP/Weight 03/23/2015 02/10/2015 01/05/2015 08/12/2014 08/10/2014 07/06/2014 28/31/5176  Systolic BP 160 737 106 269 485 462 703  Diastolic BP 95 82 80 65 82 85 73  Wt. (Lbs) 215.2 211 218.8 217 217 218 217  BMI 34.75 34.57 35.84 35.04 35.04 35.2 35.04

## 2015-04-16 NOTE — Progress Notes (Signed)
   [name]     MRN: [id number]      DOB: 15-Apr-1941   HPI [name] is here with a main c/o dizziness and feels as though the room is spinning with change in position, this is recurrent , but has been worse in the past 2 weeks Denies any recent fever, chills or symptoms of infection C/o episodes as though she will pass out from time to time, again mostly with position change C/o sinuses remaining stopped up   ROS Denies recent fever or chills.  Denies chest congestion, productive cough or wheezing. Denies chest pains, palpitations and leg swelling Denies abdominal pain, nausea, vomiting,diarrhea or constipation.   Dec/o  joint pain, swelling and limitation in mobility. Denies headaches, seizures, numbness, or tingling. Denies depression, anxiety or insomnia. Denies skin break down or rash.   PE  BP 140/82 mmHg  Pulse 90  Resp 16  Ht 5' 5.5" (1.664 m)  Wt 211 lb (95.709 kg)  BMI 34.57 kg/m2  SpO2 97%  Patient alert and oriented and in no cardiopulmonary distress.  HEENT: No facial asymmetry, EOMI,   oropharynx pink and moist.  Neck supple no JVD, no mass.Bruits Sinuses tender over maxillary areas, nasal mucosa erythematous  No nystagmus  Chest: Clear to auscultation bilaterally.  CVS: S1, S2 no murmurs, no S3.Regular rate.  ABD: Soft non tender.   Ext: No edema  MS: Adequate ROM spine, shoulders, hips and knees.  Skin: Intact, no ulcerations or rash noted.  Psych: Good eye contact, normal affect. Memory intact not anxious or depressed appearing.  CNS: CN 2-12 intact, power,  normal throughout.no focal deficits noted.   Assessment & Plan   NICOTINE ADDICTION Patient counseled for approximately 5 minutes regarding the health risks of ongoing nicotine use, specifically all types of cancer, heart disease, stroke and respiratory failure. The options available for help with cessation ,the behavioral changes to assist the process, and the option to either  gradully reduce usage  Or abruptly stop.is also discussed. Pt is also encouraged to set specific goals in number of cigarettes used daily, as well as to set a quit date.  Number of cigarettes/cigars currently smoking daily: 2   Bilateral carotid bruits Light headed  With change in position, needd [place] to further evaluate  Otalgia of right ear Intermittent right otalgia for past 1 year, worse in the past 1 month,  Need ENT eval , also eval for vertigo  Essential hypertension DASH diet and commitment to daily physical activity for a minimum of 30 minutes discussed and encouraged, as a part of hypertension management. The importance of attaining a healthy weight is also discussed. Sub optimal control, no med change at this time  BP/Weight 03/23/2015 02/10/2015 01/05/2015 08/12/2014 08/10/2014 07/06/2014 09/47/0962  Systolic BP 836 629 476 546 503 546 568  Diastolic BP 95 82 80 65 82 85 73  Wt. (Lbs) 215.2 211 218.8 217 217 218 217  BMI 34.75 34.57 35.84 35.04 35.04 35.2 35.04        Sinusitis, chronic C/o chronic sinus drainage despite daily use of allergy medication and saline sprays, "always feels clogged up" Will obtain sinus x rays and ENT to further eval

## 2015-04-16 NOTE — Assessment & Plan Note (Signed)
C/o chronic sinus drainage despite daily use of allergy medication and saline sprays, "always feels clogged up" Will obtain sinus x rays and ENT to further eval

## 2015-04-16 NOTE — Assessment & Plan Note (Signed)
Controlled, no change in medication  

## 2015-05-05 ENCOUNTER — Ambulatory Visit (INDEPENDENT_AMBULATORY_CARE_PROVIDER_SITE_OTHER): Payer: Medicare Other | Admitting: Otolaryngology

## 2015-05-05 DIAGNOSIS — H608X3 Other otitis externa, bilateral: Secondary | ICD-10-CM | POA: Diagnosis not present

## 2015-05-05 DIAGNOSIS — J310 Chronic rhinitis: Secondary | ICD-10-CM | POA: Diagnosis not present

## 2015-05-05 DIAGNOSIS — H8111 Benign paroxysmal vertigo, right ear: Secondary | ICD-10-CM

## 2015-05-24 ENCOUNTER — Ambulatory Visit (INDEPENDENT_AMBULATORY_CARE_PROVIDER_SITE_OTHER): Payer: Medicare Other

## 2015-05-24 DIAGNOSIS — Z23 Encounter for immunization: Secondary | ICD-10-CM

## 2015-06-06 ENCOUNTER — Encounter: Payer: Self-pay | Admitting: Gastroenterology

## 2015-06-22 ENCOUNTER — Encounter: Payer: Self-pay | Admitting: Family Medicine

## 2015-06-22 ENCOUNTER — Ambulatory Visit (INDEPENDENT_AMBULATORY_CARE_PROVIDER_SITE_OTHER): Payer: Medicare Other | Admitting: Family Medicine

## 2015-06-22 VITALS — BP 142/82 | HR 97 | Resp 16 | Ht 66.0 in | Wt 218.1 lb

## 2015-06-22 DIAGNOSIS — E559 Vitamin D deficiency, unspecified: Secondary | ICD-10-CM

## 2015-06-22 DIAGNOSIS — E785 Hyperlipidemia, unspecified: Secondary | ICD-10-CM

## 2015-06-22 DIAGNOSIS — J3089 Other allergic rhinitis: Secondary | ICD-10-CM | POA: Diagnosis not present

## 2015-06-22 DIAGNOSIS — Z0000 Encounter for general adult medical examination without abnormal findings: Secondary | ICD-10-CM | POA: Diagnosis not present

## 2015-06-22 DIAGNOSIS — Z1211 Encounter for screening for malignant neoplasm of colon: Secondary | ICD-10-CM

## 2015-06-22 DIAGNOSIS — F17200 Nicotine dependence, unspecified, uncomplicated: Secondary | ICD-10-CM

## 2015-06-22 DIAGNOSIS — I10 Essential (primary) hypertension: Secondary | ICD-10-CM

## 2015-06-22 DIAGNOSIS — R7303 Prediabetes: Secondary | ICD-10-CM

## 2015-06-22 LAB — HEMOCCULT GUIAC POC 1CARD (OFFICE): FECAL OCCULT BLD: NEGATIVE

## 2015-06-22 MED ORDER — PREDNISONE 5 MG PO TABS: 5.0000 mg | ORAL_TABLET | Freq: Two times a day (BID) | ORAL | Status: AC

## 2015-06-22 MED ORDER — METHYLPREDNISOLONE ACETATE 80 MG/ML IJ SUSP
80.0000 mg | Freq: Once | INTRAMUSCULAR | Status: AC
  Administered 2015-06-22: 80 mg via INTRAMUSCULAR

## 2015-06-22 MED ORDER — HYDROCHLOROTHIAZIDE 25 MG PO TABS: ORAL_TABLET | ORAL | Status: DC

## 2015-06-22 NOTE — Assessment & Plan Note (Signed)
Uncontrolled, depo medrol 80 mg IM in office followed by prednisone x 5 days

## 2015-06-22 NOTE — Progress Notes (Signed)
   Subjective:    Patient ID: [name], female    DOB: 1940-08-01, 74 y.o.   MRN: [id number]  HPI Patient is in for annual physical exam. 1 month h/o excess cough and head congestion, sputum scant , but at times cream to yellow Recent labs, if available are reviewed. Immunization is reviewed , and  updated if needed.    Review of Systems See HPI     Objective:   Physical Exam BP 142/82 mmHg  Pulse 97  Resp 16  Ht 5\' 6"  (1.676 m)  Wt 218 lb 1.9 oz (98.939 kg)  BMI 35.22 kg/m2  SpO2 96%  Pleasant  Obese female, alert and oriented x 3, in no cardio-pulmonary distress. Afebrile. HEENT No facial trauma or asymetry. Sinuses non tender. Nasal mucosa erythematous and edematous, excessive watery eyes and sneezing, bilateral conjunctival injection Extra occullar muscles intact, pupils equally reactive to light. External ears normal, tympanic poor dentition. Neck: supple, no adenopathy,JVD or thyromegaly.No bruits.  Chest: Clear to ascultation bilaterally.No crackles or wheezes. Decfreased , though adequate air entry Non tender to palpation  Breast: No asymetry,no masses or lumps. No tenderness. No nipple discharge or inversion. No axillary or supraclavicular adenopathy  Cardiovascular system; Heart sounds normal,  S1 and  S2 ,no S3.  No murmur, or thrill. Apical beat not displaced Peripheral pulses normal.  Abdomen: Soft, non tender, no organomegaly or masses. No bruits. Bowel sounds normal. No guarding, tenderness or rebound.  Rectal:  Normal sphincter tone. No mass.No rectal masses.  Guaiac negative stool.  GU: External genitalia normal female genitalia , female distribution of hair. No lesions. Urethral meatus normal in size, no  Prolapse, no lesions visibly  Present. Bladder non tender. Vagina pink and moist , with no visible lesions , discharge present . Adequate pelvic support no  cystocele or rectocele noted  Uterus absent , no adnexal masses, no   adnexal tenderness.   Musculoskeletal exam: Decreased , though adequate  ROM of spine, hips , shoulders and knees. No deformity ,swelling or crepitus noted. No muscle wasting or atrophy.   Neurologic: Cranial nerves 2 to 12 intact. Power, tone ,sensation and reflexes normal throughout. No disturbance in gait. No tremor.  Skin: Intact, no ulceration, erythema , scaling or rash noted. Pigmentation normal throughout  Psych; Normal mood and affect. Judgement and concentration normal      Assessment & Plan:  Annual physical exam Annual exam as documented. Counseling done  re healthy lifestyle involving commitment to 150 minutes exercise per week, heart healthy diet, and attaining healthy weight.The importance of adequate sleep also discussed. Regular seat belt use and home safety, is also discussed. Changes in health habits are decided on by the patient with goals and time frames  set for achieving them. Immunization and cancer screening needs are specifically addressed at this visit.   Allergic rhinitis Uncontrolled, depo medrol 80 mg IM in office followed by prednisone x 5 days  NICOTINE ADDICTION Patient counseled for approximately 5 minutes regarding the health risks of ongoing nicotine use, specifically all types of cancer, heart disease, stroke and respiratory failure. The options available for help with cessation ,the behavioral changes to assist the process, and the option to either gradully reduce usage  Or abruptly stop.is also discussed. Pt is also encouraged to set specific goals in number of cigarettes used daily, as well as to set a quit date.  Number of cigarettes/cigars currently smoking daily: 3 to 5

## 2015-06-22 NOTE — Assessment & Plan Note (Signed)

## 2015-06-22 NOTE — Patient Instructions (Addendum)
F/u in 4 month, call if you need me before  You will get injection today for allergies and 5 day course of prednisone sent. STArt taking SINGULAIR AND USING FLONASE every DAY, ALSO CONTINUE SALT WATER FLUSHES  Fasting labs 12/15 or after  Please work on good  health habits so that your health will improve. 1. Commitment to daily physical activity for 30 to 60  minutes, if you are able to do this.  2. Commitment to wise food choices. Aim for half of your  food intake to be vegetable and fruit, one quarter starchy foods, and one quarter protein. Try to eat on a regular schedule  3 meals per day, snacking between meals should be limited to vegetables or fruits or small portions of nuts. 64 ounces of water per day is generally recommended, unless you have specific health conditions, like heart failure or kidney failure where you will need to limit fluid intake.  3. Commitment to sufficient and a  good quality of physical and mental rest daily, generally between 6 to 8 hours per day.  WITH PERSISTANCE AND PERSEVERANCE, THE IMPOSSIBLE , BECOMES THE NORM!  Thanks for choosing [facility], we consider it a privelige to serve you.  All the  Best for 2017!

## 2015-06-25 ENCOUNTER — Encounter: Payer: Self-pay | Admitting: Family Medicine

## 2015-06-25 NOTE — Assessment & Plan Note (Signed)
Patient counseled for approximately 5 minutes regarding the health risks of ongoing nicotine use, specifically all types of cancer, heart disease, stroke and respiratory failure. The options available for help with cessation ,the behavioral changes to assist the process, and the option to either gradully reduce usage  Or abruptly stop.is also discussed. Pt is also encouraged to set specific goals in number of cigarettes used daily, as well as to set a quit date.  Number of cigarettes/cigars currently smoking daily: 3 to 5  

## 2015-07-08 ENCOUNTER — Telehealth: Payer: Self-pay | Admitting: Family Medicine

## 2015-07-08 NOTE — Telephone Encounter (Signed)
[name] has bad chest cold, coughing since Sunday patient is asking for something, please advise?

## 2015-07-12 NOTE — Telephone Encounter (Signed)
Called and left message for patient to return call.  

## 2015-07-13 ENCOUNTER — Ambulatory Visit: Payer: Medicare Other | Admitting: Gastroenterology

## 2015-07-14 ENCOUNTER — Emergency Department (HOSPITAL_COMMUNITY)
Admission: EM | Admit: 2015-07-14 | Discharge: 2015-07-15 | Disposition: A | Discharge status: Home / Self Care | Payer: Medicare Other | Attending: Emergency Medicine | Admitting: Emergency Medicine

## 2015-07-14 ENCOUNTER — Encounter (HOSPITAL_COMMUNITY): Payer: Self-pay | Admitting: *Deleted

## 2015-07-14 ENCOUNTER — Emergency Department (HOSPITAL_COMMUNITY): Payer: Medicare Other

## 2015-07-14 DIAGNOSIS — F17210 Nicotine dependence, cigarettes, uncomplicated: Secondary | ICD-10-CM | POA: Insufficient documentation

## 2015-07-14 DIAGNOSIS — M1990 Unspecified osteoarthritis, unspecified site: Secondary | ICD-10-CM | POA: Insufficient documentation

## 2015-07-14 DIAGNOSIS — R05 Cough: Secondary | ICD-10-CM | POA: Diagnosis not present

## 2015-07-14 DIAGNOSIS — I10 Essential (primary) hypertension: Secondary | ICD-10-CM | POA: Insufficient documentation

## 2015-07-14 DIAGNOSIS — J441 Chronic obstructive pulmonary disease with (acute) exacerbation: Secondary | ICD-10-CM | POA: Insufficient documentation

## 2015-07-14 DIAGNOSIS — Z7951 Long term (current) use of inhaled steroids: Secondary | ICD-10-CM | POA: Insufficient documentation

## 2015-07-14 DIAGNOSIS — R062 Wheezing: Secondary | ICD-10-CM | POA: Diagnosis not present

## 2015-07-14 DIAGNOSIS — Z79899 Other long term (current) drug therapy: Secondary | ICD-10-CM | POA: Insufficient documentation

## 2015-07-14 DIAGNOSIS — Z7982 Long term (current) use of aspirin: Secondary | ICD-10-CM | POA: Insufficient documentation

## 2015-07-14 DIAGNOSIS — B9789 Other viral agents as the cause of diseases classified elsewhere: Secondary | ICD-10-CM

## 2015-07-14 DIAGNOSIS — J069 Acute upper respiratory infection, unspecified: Secondary | ICD-10-CM | POA: Diagnosis not present

## 2015-07-14 DIAGNOSIS — Z8659 Personal history of other mental and behavioral disorders: Secondary | ICD-10-CM | POA: Diagnosis not present

## 2015-07-14 DIAGNOSIS — E669 Obesity, unspecified: Secondary | ICD-10-CM | POA: Diagnosis not present

## 2015-07-14 LAB — CBC WITH DIFFERENTIAL/PLATELET
Basophils Absolute: 0 10*3/uL (ref 0.0–0.1)
Basophils Relative: 0 %
Eosinophils Absolute: 0.4 10*3/uL (ref 0.0–0.7)
Eosinophils Relative: 5 %
HCT: 35.5 % — ABNORMAL LOW (ref 36.0–46.0)
HEMOGLOBIN: 12.1 g/dL (ref 12.0–15.0)
LYMPHS ABS: 3.1 10*3/uL (ref 0.7–4.0)
LYMPHS PCT: 33 %
MCH: 29.4 pg (ref 26.0–34.0)
MCHC: 34.1 g/dL (ref 30.0–36.0)
MCV: 86.2 fL (ref 78.0–100.0)
MONOS PCT: 10 %
Monocytes Absolute: 0.9 10*3/uL (ref 0.1–1.0)
NEUTROS PCT: 52 %
Neutro Abs: 4.7 10*3/uL (ref 1.7–7.7)
Platelets: 255 10*3/uL (ref 150–400)
RBC: 4.12 MIL/uL (ref 3.87–5.11)
RDW: 13.9 % (ref 11.5–15.5)
WBC: 9.1 10*3/uL (ref 4.0–10.5)

## 2015-07-14 LAB — BASIC METABOLIC PANEL
Anion gap: 8 (ref 5–15)
BUN: 10 mg/dL (ref 6–20)
CHLORIDE: 101 mmol/L (ref 101–111)
CO2: 30 mmol/L (ref 22–32)
Calcium: 8.5 mg/dL — ABNORMAL LOW (ref 8.9–10.3)
Creatinine, Ser: 0.89 mg/dL (ref 0.44–1.00)
GFR calc non Af Amer: >60 mL/min (ref >60)
Glucose, Bld: 145 mg/dL — ABNORMAL HIGH (ref 65–99)
POTASSIUM: 3.7 mmol/L (ref 3.5–5.1)
SODIUM: 139 mmol/L (ref 135–145)

## 2015-07-14 MED ORDER — IPRATROPIUM-ALBUTEROL 0.5-2.5 (3) MG/3ML IN SOLN
3.0000 mL | Freq: Once | RESPIRATORY_TRACT | Status: AC
  Administered 2015-07-14: 3 mL via RESPIRATORY_TRACT
  Filled 2015-07-14: qty 3

## 2015-07-14 MED ORDER — PREDNISONE 50 MG PO TABS
60.0000 mg | ORAL_TABLET | Freq: Once | ORAL | Status: AC
  Administered 2015-07-15: 60 mg via ORAL
  Filled 2015-07-14: qty 1

## 2015-07-14 MED ORDER — ALBUTEROL SULFATE (2.5 MG/3ML) 0.083% IN NEBU
2.5000 mg | INHALATION_SOLUTION | Freq: Once | RESPIRATORY_TRACT | Status: AC
  Administered 2015-07-14: 2.5 mg via RESPIRATORY_TRACT
  Filled 2015-07-14: qty 3

## 2015-07-14 MED ORDER — ALBUTEROL (5 MG/ML) CONTINUOUS INHALATION SOLN
15.0000 mg/h | INHALATION_SOLUTION | RESPIRATORY_TRACT | Status: DC
  Administered 2015-07-14: 15 mg/h via RESPIRATORY_TRACT
  Filled 2015-07-14: qty 20

## 2015-07-14 NOTE — ED Provider Notes (Signed)
By signing my name below, I, Meriel Pica, attest that this documentation has been prepared under the direction and in the presence of Chambers, DO. Electronically Signed: Meriel Pica, ED Scribe. 07/14/2015. 11:07 PM.  TIME SEEN: 11:07 PM  CHIEF COMPLAINT: Wheezing; Cough  HPI:  HPI Comments: [name] is a 74 y.o. female, with a h/o COPD and HLD, who is a current smoker, presents to the Emergency Department complaining of a gradually worsening cough that is productive with yellow sputum and wheezing X 6 days. Pt reports being around a large crowd at church recently but denies any known sick contacts. Pt ate a red onion this evening in an attempt to 'break loose' the sputum. She denies fevers, chills, nausea, vomiting, diarrhea, CP, or SOB. She is not on home O2.  No history of PE or DVT.  ROS: See HPI Constitutional: no fever  Eyes: no drainage  ENT: no runny nose   Cardiovascular:  no chest pain  Resp: no SOB, + cough, + wheezing GI: no vomiting GU: no dysuria Integumentary: no rash  Allergy: no hives  Musculoskeletal: no leg swelling  Neurological: no slurred speech ROS otherwise negative  PAST MEDICAL HISTORY/PAST SURGICAL HISTORY:  Past Medical History  Diagnosis Date  . Sinusitis   . OA (osteoarthritis) of knee   . Nicotine addiction   . COPD (chronic obstructive pulmonary disease) ([place])   . Obesity   . Lung nodule seen on imaging study   . Hypertension 2004    MEDICATIONS:  Prior to Admission medications   Medication Sig Start Date End Date Taking? Authorizing Provider  ALOE VERA PO Take 500 mg by mouth daily.    Historical Provider, MD  aspirin EC 81 MG tablet Take 81 mg by mouth daily.    Historical Provider, MD  Calcium Carbonate-Vitamin D (CALCIUM + D PO) Take 1 tablet by mouth daily.    Historical Provider, MD  fluticasone (FLONASE) 50 MCG/ACT nasal spray Place 2 sprays into both nostrils daily as needed for allergies or rhinitis. 02/10/15    Fayrene Helper, MD  hydrochlorothiazide (HYDRODIURIL) 25 MG tablet TAKE 1 TABLET(25 MG) BY MOUTH DAILY 06/22/15   Fayrene Helper, MD  ibuprofen (ADVIL,MOTRIN) 800 MG tablet One tablet twice daily for 1 week 01/05/15   Fayrene Helper, MD  lubiprostone (AMITIZA) 24 MCG capsule Take 1 capsule (24 mcg total) by mouth 2 (two) times daily with a meal. 03/23/15   Danie Binder, MD  meclizine (ANTIVERT) 25 MG tablet Take 1 tablet (25 mg total) by mouth 3 (three) times daily as needed for dizziness. Patient not taking: Reported on 06/22/2015 02/02/15   Fayrene Helper, MD  montelukast (SINGULAIR) 10 MG tablet TAKE 1 TABLET(10 MG) BY MOUTH AT BEDTIME Patient not taking: Reported on 06/22/2015 04/04/15   Fayrene Helper, MD  omeprazole (PRILOSEC OTC) 20 MG tablet 1 po 30 mins prior to breakfast forever 07/06/14   Danie Binder, MD  pravastatin (PRAVACHOL) 40 MG tablet TAKE 1 TABLET(40 MG) BY MOUTH AT BEDTIME 04/05/15   Fayrene Helper, MD  sodium chloride (OCEAN) 0.65 % SOLN nasal spray Place 1 spray into both nostrils as needed for congestion.    Historical Provider, MD    ALLERGIES:  No Known Allergies  SOCIAL HISTORY:  Social History  Substance Use Topics  . Smoking status: Current Some Day Smoker -- 0.30 packs/day for 10 years    Types: Cigarettes    Start date:  07/23/1962  . Smokeless tobacco: Never Used     Comment: smokes 1 or 2 per day  . Alcohol Use: No    FAMILY HISTORY: Family History  Problem Relation Age of Onset  . Diabetes Mother   . Hypertension Mother   . Coronary artery disease Mother   . Pneumonia Father   . Hypertension Sister   . Hypertension Sister   . Hypertension Brother   . Colon cancer Neg Hx   . Liver disease Neg Hx   . Hypertension Brother     EXAM: BP 161/69 mmHg  Pulse 118  Temp(Src) 97.9 F (36.6 C) (Oral)  Resp 24  Ht 5\' 6"  (1.676 m)  Wt 218 lb (98.884 kg)  BMI 35.20 kg/m2  SpO2 96% CONSTITUTIONAL: Alert and oriented and  responds appropriately to questions. Elderly, in no significant distress, pleasant, afebrile, nontoxic HEAD: Normocephalic EYES: Conjunctivae clear, PERRL ENT: normal nose; no rhinorrhea; moist mucous membranes; pharynx without lesions noted NECK: Supple, no meningismus, no LAD  CARD: Regular and tachycardic; S1 and S2 appreciated; no murmurs, no clicks, no rubs, no gallops RESP: Normal chest excursion without splinting or tachypnea; breath sounds equal bilaterally; diffuse expiratory wheezes, no rhonchi, no rales, no hypoxia or respiratory distress, speaking full sentences ABD/GI: Normal bowel sounds; non-distended; soft, non-tender, no rebound, no guarding, no peritoneal signs BACK:  The back appears normal and is non-tender to palpation, there is no CVA tenderness EXT: Normal ROM in all joints; non-tender to palpation; no edema; normal capillary refill; no cyanosis, no calf tenderness or swelling    SKIN: Normal color for age and race; warm NEURO: Moves all extremities equally, sensation to light touch intact diffusely, cranial nerves II through XII intact PSYCH: The patient's mood and manner are appropriate. Grooming and personal hygiene are appropriate.  MEDICAL DECISION MAKING: Patient here with likely viral upper respiratory infection causing COPD exacerbation. She denies any chest pain or shortness of breath just cough and wheezing. No respiratory distress. Speaking full sentences without hypoxia. We'll give continuous albuterol, prednisone. Will obtain chest x-ray to evaluate for possible pneumonia. She does not appear volume overloaded on exam.  ED PROGRESS: Patient sounds much better with better aeration and decreased wheezing after continuous albuterol. Chest x-ray shows no infiltrate. Labs unremarkable. She reports feeling much better. We'll give another couple of rounds of DuoNebs and then reassess patient, ambulate with pulse oximetry.  2:15 AM  Pt's breath sounds have remarkably  improved and there is just minimal wheezing now. It with a meal and her sats do not drop below 96%. She reports she is ready to be discharged. We'll discharge with prescription for albuterol, prednisone burst. Discussed return precautions. Will also discharge with prescription for Questran to send to use as needed. Discussed at length return precautions. She verbalized understanding and is comfortable with this plan.      I personally performed the services described in this documentation, which was scribed in my presence. The recorded information has been reviewed and is accurate.    [place], DO 07/15/15 [phone]

## 2015-07-14 NOTE — ED Notes (Signed)
Pt with audible wheezing while transporting pt to room in wheelchair

## 2015-07-14 NOTE — ED Notes (Signed)
Pt with productive cough and wheezing since last Friday, denies fever or N/V/D

## 2015-07-15 MED ORDER — PREDNISONE 20 MG PO TABS: 60.0000 mg | ORAL_TABLET | Freq: Every day | ORAL | Status: DC

## 2015-07-15 MED ORDER — ALBUTEROL SULFATE HFA 108 (90 BASE) MCG/ACT IN AERS: 2.0000 | INHALATION_SPRAY | RESPIRATORY_TRACT | Status: DC | PRN

## 2015-07-15 MED ORDER — GUAIFENESIN 100 MG/5ML PO SOLN
ORAL | Status: AC
  Administered 2015-07-15: 200 mg
  Filled 2015-07-15: qty 10

## 2015-07-15 MED ORDER — GUAIFENESIN 200 MG PO TABS
200.0000 mg | ORAL_TABLET | ORAL | Status: DC | PRN
Start: 2015-07-15 — End: 2015-10-18

## 2015-07-15 MED ORDER — IPRATROPIUM-ALBUTEROL 0.5-2.5 (3) MG/3ML IN SOLN
3.0000 mL | RESPIRATORY_TRACT | Status: AC
  Administered 2015-07-15 (×2): 3 mL via RESPIRATORY_TRACT
  Filled 2015-07-15: qty 6

## 2015-07-15 MED ORDER — GUAIFENESIN 200 MG PO TABS: 200.0000 mg | ORAL_TABLET | Freq: Once | ORAL | Status: DC

## 2015-07-15 NOTE — ED Notes (Signed)
Patient ambulated around nursing station O2 sat 96-100.

## 2015-07-15 NOTE — ED Notes (Signed)
Patient receiving second breathing treatment. Will ambulate patient after treatment.

## 2015-07-15 NOTE — Discharge Instructions (Signed)
Chronic Obstructive Pulmonary Disease Exacerbation Chronic obstructive pulmonary disease (COPD) is a common lung condition in which airflow from the lungs is limited. COPD is a general term that can be used to describe many different lung problems that limit airflow, including chronic bronchitis and emphysema. COPD exacerbations are episodes when breathing symptoms become much worse and require extra treatment. Without treatment, COPD exacerbations can be life threatening, and frequent COPD exacerbations can cause further damage to your lungs. CAUSES  Respiratory infections.  Exposure to smoke.  Exposure to air pollution, chemical fumes, or dust. Sometimes there is no apparent cause or trigger. RISK FACTORS  Smoking cigarettes.  Older age.  Frequent prior COPD exacerbations. SIGNS AND SYMPTOMS  Increased coughing.  Increased thick spit (sputum) production.  Increased wheezing.  Increased shortness of breath.  Rapid breathing.  Chest tightness. DIAGNOSIS Your medical history, a physical exam, and tests will help your health care provider make a diagnosis. Tests may include:  A chest X-ray.  Basic lab tests.  Sputum testing.  An arterial blood gas test. TREATMENT Depending on the severity of your COPD exacerbation, you may need to be admitted to a hospital for treatment. Some of the treatments commonly used to treat COPD exacerbations are:   Antibiotic medicines.  Bronchodilators. These are drugs that expand the air passages. They may be given with an inhaler or nebulizer. Spacer devices may be needed to help improve drug delivery.  Corticosteroid medicines.  Supplemental oxygen therapy.  Airway clearing techniques, such as noninvasive ventilation (NIV) and positive expiratory pressure (PEP). These provide respiratory support through a mask or other noninvasive device. HOME CARE INSTRUCTIONS  Do not smoke. Quitting smoking is very important to prevent COPD from  getting worse and exacerbations from happening as often.  Avoid exposure to all substances that irritate the airway, especially to tobacco smoke.  If you were prescribed an antibiotic medicine, finish it all even if you start to feel better.  Take all medicines as directed by your health care provider.It is important to use correct technique with inhaled medicines.  Drink enough fluids to keep your urine clear or pale yellow (unless you have a medical condition that requires fluid restriction).  Use a cool mist vaporizer. This makes it easier to clear your chest when you cough.  If you have a home nebulizer and oxygen, continue to use them as directed.  Maintain all necessary vaccinations to prevent infections.  Exercise regularly.  Eat a healthy diet.  Keep all follow-up appointments as directed by your health care provider. SEEK IMMEDIATE MEDICAL CARE IF:  You have worsening shortness of breath.  You have trouble talking.  You have severe chest pain.  You have blood in your sputum.  You have a fever.  You have weakness, vomit repeatedly, or faint.  You feel confused.  You continue to get worse. MAKE SURE YOU:  Understand these instructions.  Will watch your condition.  Will get help right away if you are not doing well or get worse.   This information is not intended to replace advice given to you by your health care provider. Make sure you discuss any questions you have with your health care provider.   Document Released: 05/06/2007 Document Revised: 07/30/2014 Document Reviewed: 03/13/2013 Elsevier Interactive Patient Education 2016 [employer]  Viral Infections A viral infection can be caused by different types of viruses.Most viral infections are not serious and resolve on their own. However, some infections may cause severe symptoms and may lead to  further complications. SYMPTOMS Viruses can frequently cause:  Minor sore throat.  Aches and  pains.  Headaches.  Runny nose.  Different types of rashes.  Watery eyes.  Tiredness.  Cough.  Loss of appetite.  Gastrointestinal infections, resulting in nausea, vomiting, and diarrhea. These symptoms do not respond to antibiotics because the infection is not caused by bacteria. However, you might catch a bacterial infection following the viral infection. This is sometimes called a "superinfection." Symptoms of such a bacterial infection may include:  Worsening sore throat with pus and difficulty swallowing.  Swollen neck glands.  Chills and a high or persistent fever.  Severe headache.  Tenderness over the sinuses.  Persistent overall ill feeling (malaise), muscle aches, and tiredness (fatigue).  Persistent cough.  Yellow, green, or brown mucus production with coughing. HOME CARE INSTRUCTIONS   Only take over-the-counter or prescription medicines for pain, discomfort, diarrhea, or fever as directed by your caregiver.  Drink enough water and fluids to keep your urine clear or pale yellow. Sports drinks can provide valuable electrolytes, sugars, and hydration.  Get plenty of rest and maintain proper nutrition. Soups and broths with crackers or rice are fine. SEEK IMMEDIATE MEDICAL CARE IF:   You have severe headaches, shortness of breath, chest pain, neck pain, or an unusual rash.  You have uncontrolled vomiting, diarrhea, or you are unable to keep down fluids.  You or your child has an oral temperature above 102 F (38.9 C), not controlled by medicine.  Your baby is older than 3 months with a rectal temperature of 102 F (38.9 C) or higher.  Your baby is 15 months old or younger with a rectal temperature of 100.4 F (38 C) or higher. MAKE SURE YOU:   Understand these instructions.  Will watch your condition.  Will get help right away if you are not doing well or get worse.   This information is not intended to replace advice given to you by your health  care provider. Make sure you discuss any questions you have with your health care provider.   Document Released: 04/18/2005 Document Revised: 10/01/2011 Document Reviewed: 12/15/2014 Elsevier Interactive Patient Education [employer].

## 2015-07-20 NOTE — Telephone Encounter (Signed)
Was unable to reach patient. States she went to the ER and is feeling some better but when all her meds are done if she is still feeling bad she will call back

## 2015-08-06 IMAGING — RF DG ESOPHAGUS
16 of 24 series · 16 of 24 positions shown · non-contrast
Comparison: None

FLUOROSCOPY TIME:  1 min 48 seconds

CLINICAL DATA: Dysphagia, esophageal dilatation in past, history
hiatal hernia

EXAM:
ESOPHOGRAM/BARIUM SWALLOW
TECHNIQUE: Combined double contrast and single contrast examination performed
using effervescent crystals, thick barium liquid, and thin barium
liquid. Patient swallowed a 12.5 mm diameter barium tablet.

[Series 1: run · 1 of 1 slices shown (1 of 16)]
[im 1/1]
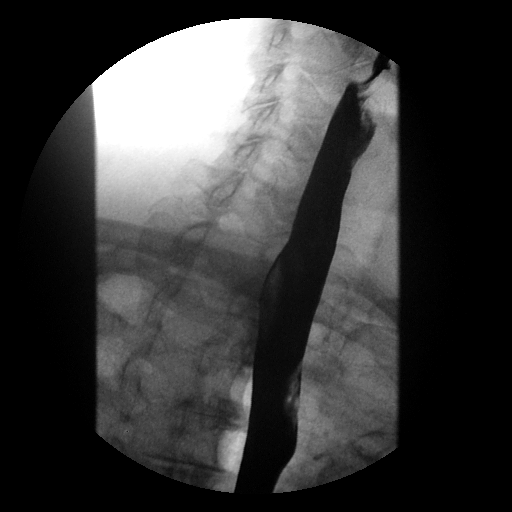

[Series 3: run · 1 of 1 slices shown (2 of 16)]
[im 1/1]
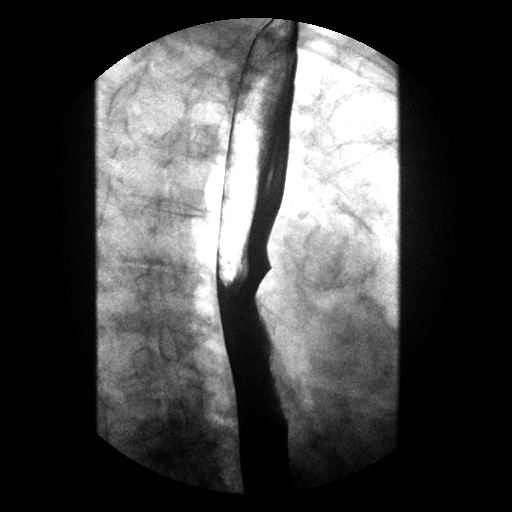

[Series 4: run · 1 of 1 slices shown (3 of 16)]
[im 1/1]
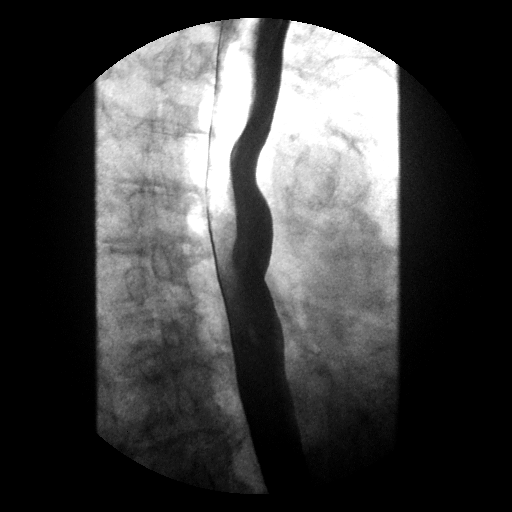

[Series 6: run · 1 of 1 slices shown (4 of 16)]
[im 1/1]
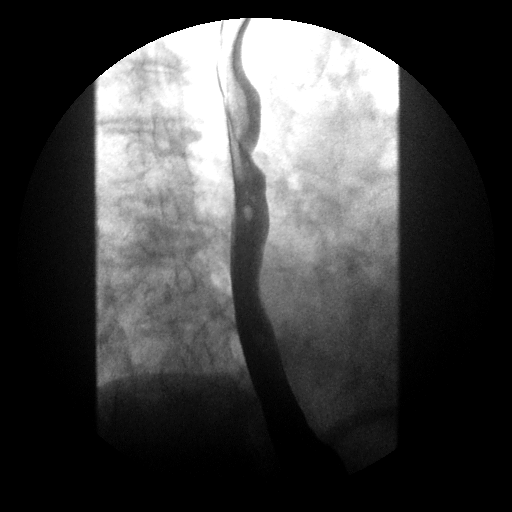

[Series 7: run · 1 of 1 slices shown (5 of 16)]
[im 1/1]
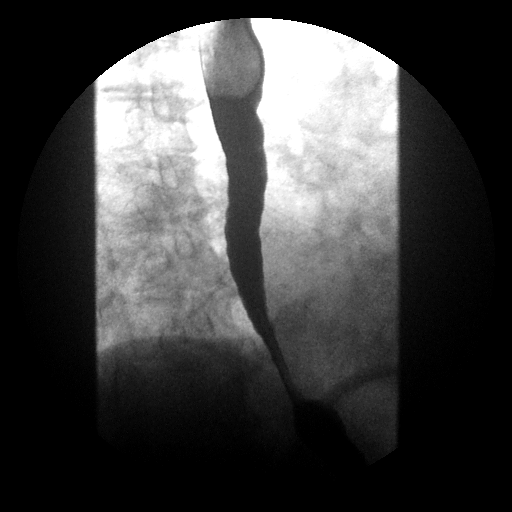

[Series 9: run · 1 of 1 slices shown (6 of 16)]
[im 1/1]
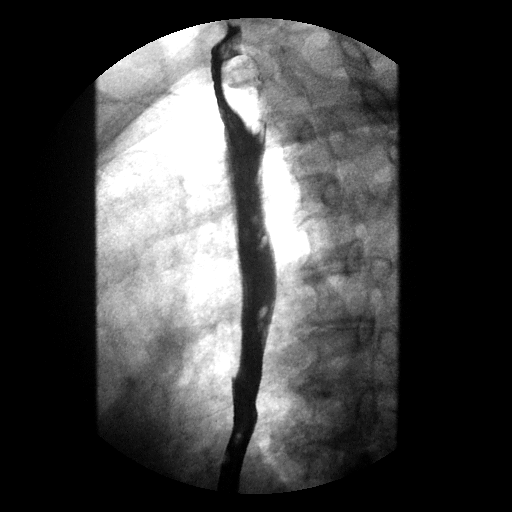

[Series 10: run · 1 of 1 slices shown (7 of 16)]
[im 1/1]
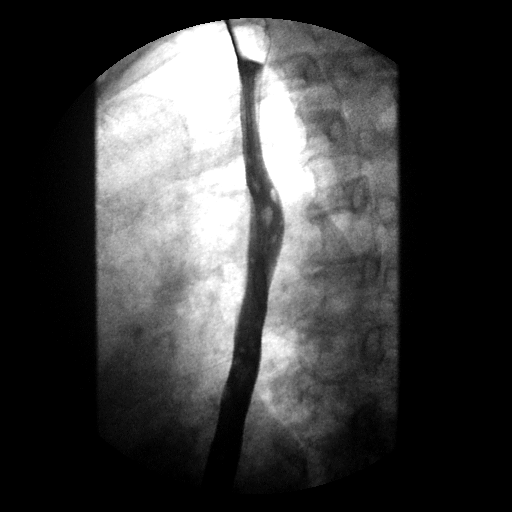

[Series 12: run · 1 of 1 slices shown (8 of 16)]
[im 1/1]
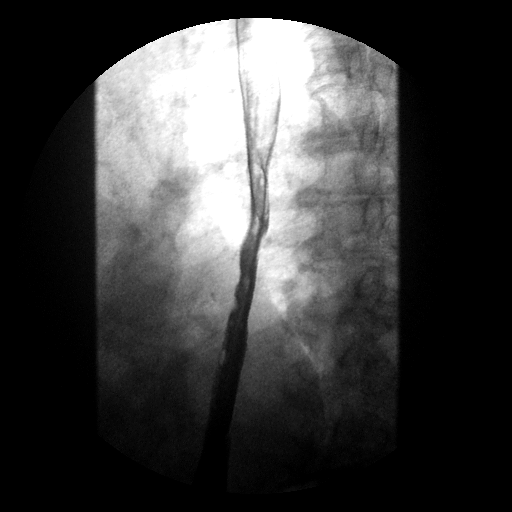

[Series 13: run · 1 of 1 slices shown (9 of 16)]
[im 1/1]
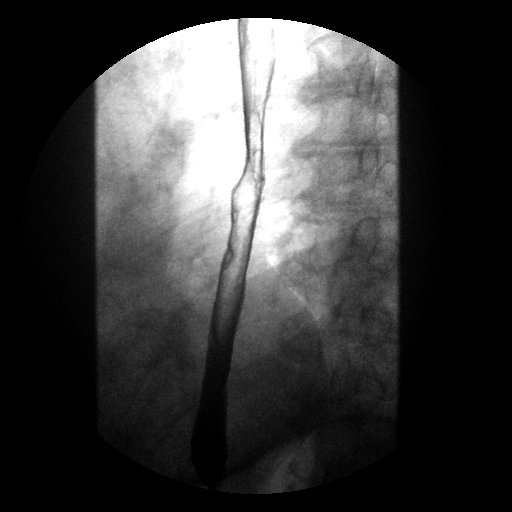

[Series 15: run · 1 of 9 slices shown (10 of 16)]
[im 1/9]
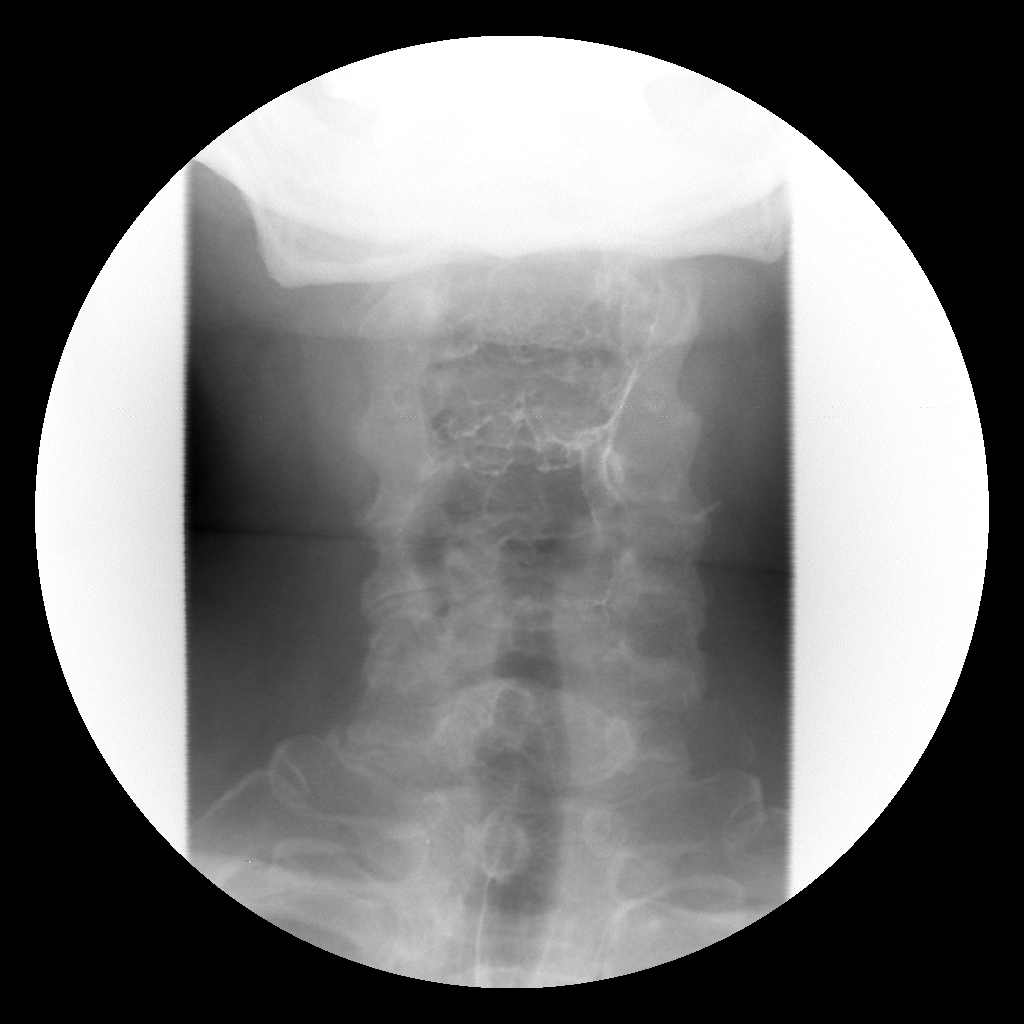

[Series 16: run · 1 of 1 slices shown (11 of 16)]
[im 1/1]
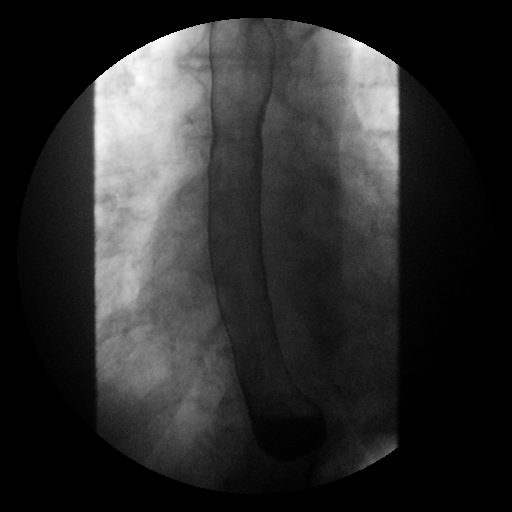

[Series 18: run · 1 of 1 slices shown (12 of 16)]
[im 1/1]
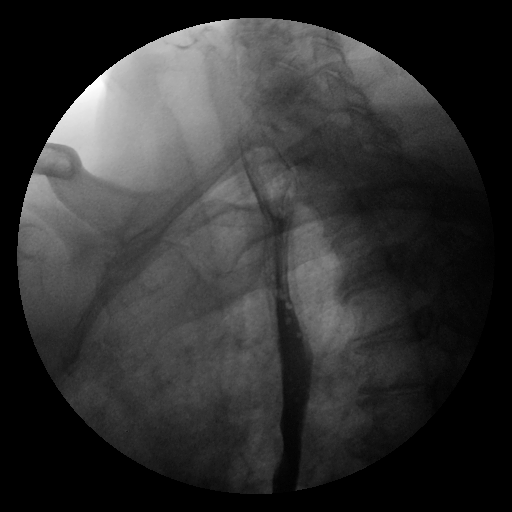

[Series 19: run · 1 of 1 slices shown (13 of 16)]
[im 1/1]
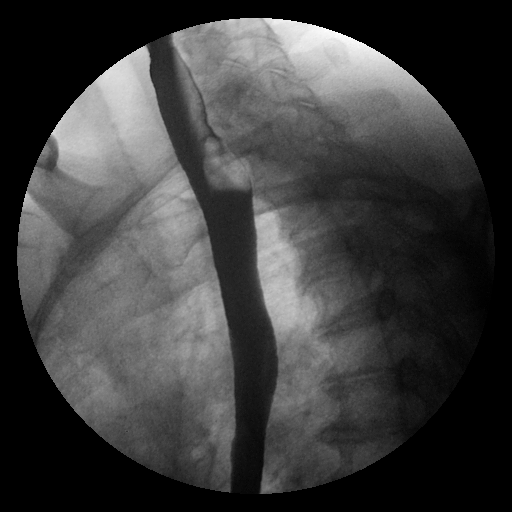

[Series 21: run · 1 of 1 slices shown (14 of 16)]
[im 1/1]
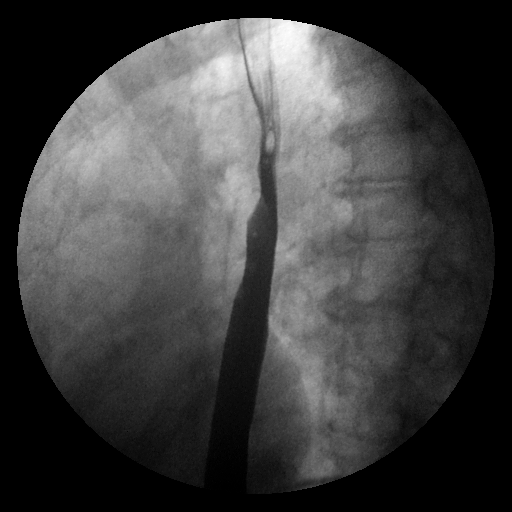

[Series 22: run · 1 of 1 slices shown (15 of 16)]
[im 1/1]
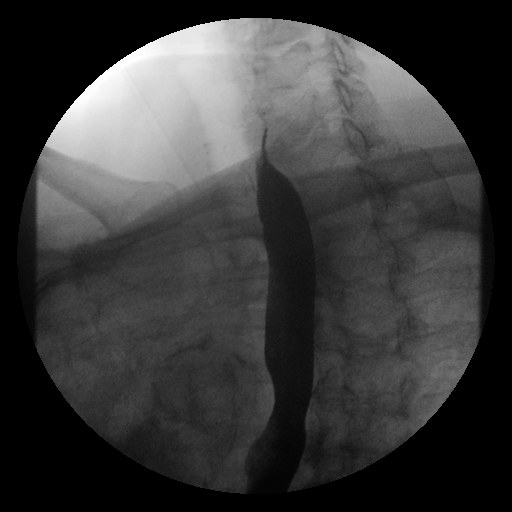

[Series 24: run · 1 of 1 slices shown (16 of 16)]
[im 1/1]
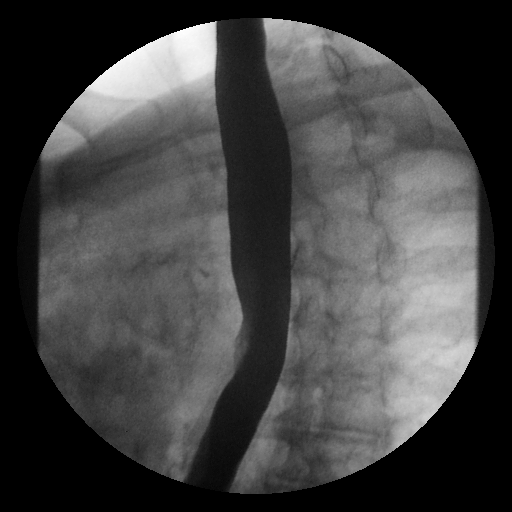

[16 of 24 positions shown; findings below may reference images not displayed]

FINDINGS: Normal esophageal distention.

No esophageal mass or stricture.

Mild diffuse age-related impairment of esophageal motility with
incomplete clearance of barium by primary peristaltic waves.

Clearance of contrast from the esophagus is facilitated by secondary
waves and upright positioning as well as repeat swallows.

No hiatal hernia visualized during the exam.

Targeted rapid sequence imaging of the cervical esophagus and
hypopharynx showed no significant laryngeal penetration or
aspiration.

Air contrast imaging shows generally smooth appearance of esophageal
mucosa without evidence of ulceration.

A single air contrast image demonstrates a questionable area of
nodularity/filling defect at the mid to distal thoracic esophagus.

This could represent a small sessile lesion or an artifact and is
not seen on remaining images.
IMPRESSION: Mild age-related impairment of esophageal motility.

Single image showing a questionable assess I will lesion within the
mid to distal thoracic esophagus, unable to exclude a small filling
defect/mass.

Endoscopic correlation recommended.

## 2015-08-07 ENCOUNTER — Other Ambulatory Visit: Payer: Self-pay | Admitting: Family Medicine

## 2015-08-11 ENCOUNTER — Ambulatory Visit: Payer: Medicare Other | Admitting: Gastroenterology

## 2015-09-01 ENCOUNTER — Ambulatory Visit (INDEPENDENT_AMBULATORY_CARE_PROVIDER_SITE_OTHER): Payer: Medicare Other | Admitting: Gastroenterology

## 2015-09-01 ENCOUNTER — Encounter: Payer: Self-pay | Admitting: Gastroenterology

## 2015-09-01 VITALS — BP 145/83 | HR 101 | Temp 97.4°F | Ht 66.0 in | Wt 215.2 lb

## 2015-09-01 DIAGNOSIS — J410 Simple chronic bronchitis: Secondary | ICD-10-CM | POA: Diagnosis not present

## 2015-09-01 DIAGNOSIS — R1319 Other dysphagia: Secondary | ICD-10-CM

## 2015-09-01 DIAGNOSIS — K5901 Slow transit constipation: Secondary | ICD-10-CM | POA: Diagnosis not present

## 2015-09-01 MED ORDER — LUBIPROSTONE 24 MCG PO CAPS: 24.0000 ug | ORAL_CAPSULE | Freq: Two times a day (BID) | ORAL | Status: DC

## 2015-09-01 NOTE — Progress Notes (Signed)
Subjective:    Patient ID: [name], female    DOB: 23-Sep-1940, 75 y.o.   MRN: [id number]  Laura Nakayama, MD  HPI BOWELS GOOD. DRINKING WATER AND EATING TWO APPLES A DAY. MOM: 1-2X/WEEK. HAS A NAGGING COUGH SINCE JAN 2017. USING ALBUTEROL PRN W/O A SPACER. BMs: QOD(#4 MOSTLY). WEIGHT LOSS: BEEN TRYING, 15 LBS(2015-230 LBS).  PT DENIES FEVER, CHILLS, HEMATOCHEZIA, nausea, vomiting, melena, diarrhea, CHEST PAIN, SHORTNESS OF BREATH, CHANGE IN BOWEL IN HABITS, constipation, abdominal pain, problems swallowing, OR heartburn or indigestion.  Past Medical History  Diagnosis Date  . Sinusitis   . OA (osteoarthritis) of knee   . Nicotine addiction   . COPD (chronic obstructive pulmonary disease) ([place])   . Obesity   . Lung nodule seen on imaging study   . Hypertension 2004    Past Surgical History  Procedure Laterality Date  . Knee arthroscopy right    . Cholecystectomy    . Total knee arthroplasty right  05/29/2005    Dr. Aline Brochure   . Bilateral cataract surgery  7/27 & 03/01/09    Dr. Gershon Crane    . Partial hysterectomy    . Esophagogastroduodenoscopy  11/07/2010    Jenkins:hiatal hernia/no evidence of Barrett esophagitis.  The Z-line was noted to be at 39 cm from the teeth. CLO test negative  . Colonoscopy  04/2004    Dr. Leane Para Smith-->melanosis coli  . Colonoscopy with esophagogastroduodenoscopy (egd) N/A 02/25/2013    Dr. Jenah Vanasten:Normal mucosa in the terminal ileum/Severe melanosis throughout the entire examined colon/ONE COLON POLYP REMOVED (tubular adenoma)/Mild diverticulosis  in the sigmoid colon/EGD:The mucosa of the esophagus appeared normal/Non-erosive gastritis, empiric dilation with Savary dilator  . Savory dilation N/A 02/25/2013    Procedure: SAVORY DILATION;  Surgeon: Danie Binder, MD;  Location: [facility];  Service: Endoscopy;  Laterality: N/A;  Venia Minks dilation N/A 02/25/2013    Procedure: Venia Minks DILATION;  Surgeon: Danie Binder, MD;  Location: [facility];  Service: Endoscopy;  Laterality: N/A;  . Esophagogastroduodenoscopy N/A 09/11/2013    Dr.Sophiamarie Nease-  incomplete esophageal web in mid-esophagus, medium sized hialtal hernia, PUD. bx=focal erosion with inflammation and fibrosis  . Left heart catheterization with coronary angiogram N/A 04/08/2014    Procedure: LEFT HEART CATHETERIZATION WITH CORONARY ANGIOGRAM;  Surgeon: Sinclair Grooms, MD;  Location: [facility] CATH LAB;  Service: Cardiovascular;  Laterality: N/A;  . Esophagogastroduodenoscopy N/A 07/06/2014    Procedure: ESOPHAGOGASTRODUODENOSCOPY (EGD);  Surgeon: Danie Binder, MD;  Location: [facility];  Service: Endoscopy;  Laterality: N/A;   No Known Allergies  Current Outpatient Prescriptions  Medication Sig Dispense Refill  . albuterol T inhaler Inhale 2-4 puffs into the lungs Q4H PRN  for wheezing or SOB    . ALOE VERA PO Take 500 mg by mouth daily.    Marland Kitchen aspirin EC 81 MG tablet Take 81 mg by mouth daily.    Marland Kitchen CALCIUM + D PO) Take 1 tablet by mouth daily.    Marland Kitchen FLONASE 50 MCG/ACT nasal spray Place 2 sprays into both nostrils daily as needed for allergies or rhinitis.    Marland Kitchen guaiFENesin 200 MG tablet Take 1 tablet (200 mg total) by mouth every 4 (four) hours as needed for cough or to loosen phlegm.    . hydrochlorothiazide 25 MG tablet TAKE 1 TABLET(25 MG) BY MOUTH DAILY     AMIITIZA 24 MCG Take 1 capsule (24 mcg total) by mouth 2 (two) times daily with a meal.    .  omeprazole 20 MG tablet 1 po 30 mins prior to breakfast forever    . (PRAVACHOL) 40 MG tablet TAKE 1 TABLET(40 MG) BY MOUTH AT BEDTIME    . OCEAN) 0.65 % SOLN nasal spray Place 1 spray into both nostrils as needed for congestion.    .      .      .       Review of Systems PER HPI OTHERWISE ALL SYSTEMS ARE NEGATIVE.     Objective:   Physical Exam  Constitutional: She is oriented to person, place, and time. She appears well-developed and well-nourished. No distress.  HENT:  Head: Normocephalic and atraumatic.    Mouth/Throat: Oropharynx is clear and moist. No oropharyngeal exudate.  Eyes: Pupils are equal, round, and reactive to light. No scleral icterus.  Neck: Normal range of motion. Neck supple.  Cardiovascular: Normal rate, regular rhythm and normal heart sounds.   Pulmonary/Chest: Effort normal and breath sounds normal. No respiratory distress.  Abdominal: Soft. Bowel sounds are normal. She exhibits no distension. There is no tenderness.  Musculoskeletal: She exhibits no edema.  Lymphadenopathy:    She has no cervical adenopathy.  Neurological: She is alert and oriented to person, place, and time.  NO FOCAL DEFICITS  Psychiatric: She has a normal mood and affect.  Vitals reviewed.     Assessment & Plan:

## 2015-09-01 NOTE — Assessment & Plan Note (Signed)
SYMPTOMS FAIRLY WELL CONTROLLED.  DRINK WATER TO KEEP YOUR URINE LIGHT YELLOW. EAT FIBER(APPLES). CONTINUE AMITIZA. USE MILK OF MAGNESIA 1 OR 2 TIMES A WEEK. FOLLOW UP IN 6 MOS.

## 2015-09-01 NOTE — Assessment & Plan Note (Signed)
SYMPTOMS CONTROLLED/RESOLVED.  CONTINUE TO MONITOR SYMPTOMS. 

## 2015-09-01 NOTE — Progress Notes (Signed)
ON RECALL  °

## 2015-09-01 NOTE — Progress Notes (Signed)
cc'ed to pcp °

## 2015-09-01 NOTE — Patient Instructions (Signed)
USE ALBUTEROL INHALER 2 PUFFS THREE TIMES A DAY FOR 2 WEEKS. USE WITH THE TOILET TISSUE CARDBOARD ROLL.  DRINK WATER TO KEEP YOUR URINE LIGHT YELLOW.  EAT FIBER(APPLES).  CONTINUE AMITIZA. USE MILK OF MAGNESIA 1 OR 2 TIMES A WEEK.  FOLLOW UP IN 6 MOS.

## 2015-09-01 NOTE — Assessment & Plan Note (Signed)
RECENT EXACERBATION. COUGH PERSISTS.  USE ALBUTEROL INHALER 2 PUFFS THREE TIMES A DAY FOR 2 WEEKS. USE WITH THE TOILET TISSUE CARDBOARD ROLL. OPV WITH DR. Moshe Cipro.

## 2015-09-20 ENCOUNTER — Encounter: Payer: Self-pay | Admitting: Family Medicine

## 2015-09-20 ENCOUNTER — Ambulatory Visit (INDEPENDENT_AMBULATORY_CARE_PROVIDER_SITE_OTHER): Payer: Medicare Other | Admitting: Family Medicine

## 2015-09-20 VITALS — BP 132/82 | HR 96 | Temp 97.7°F | Resp 16 | Ht 66.0 in | Wt 218.4 lb

## 2015-09-20 DIAGNOSIS — J328 Other chronic sinusitis: Secondary | ICD-10-CM

## 2015-09-20 DIAGNOSIS — K219 Gastro-esophageal reflux disease without esophagitis: Secondary | ICD-10-CM

## 2015-09-20 DIAGNOSIS — I10 Essential (primary) hypertension: Secondary | ICD-10-CM

## 2015-09-20 DIAGNOSIS — F17200 Nicotine dependence, unspecified, uncomplicated: Secondary | ICD-10-CM

## 2015-09-20 DIAGNOSIS — E785 Hyperlipidemia, unspecified: Secondary | ICD-10-CM

## 2015-09-20 DIAGNOSIS — J209 Acute bronchitis, unspecified: Secondary | ICD-10-CM | POA: Insufficient documentation

## 2015-09-20 MED ORDER — CEFTRIAXONE SODIUM 1 G IJ SOLR
500.0000 mg | Freq: Once | INTRAMUSCULAR | Status: AC
  Administered 2015-09-20: 500 mg via INTRAMUSCULAR

## 2015-09-20 MED ORDER — CLINDAMYCIN HCL 300 MG PO CAPS: 300.0000 mg | ORAL_CAPSULE | Freq: Four times a day (QID) | ORAL | Status: DC

## 2015-09-20 MED ORDER — BENZONATATE 100 MG PO CAPS: 100.0000 mg | ORAL_CAPSULE | Freq: Two times a day (BID) | ORAL | Status: DC | PRN

## 2015-09-20 MED ORDER — PRAVASTATIN SODIUM 40 MG PO TABS: ORAL_TABLET | ORAL | Status: DC

## 2015-09-20 NOTE — Patient Instructions (Signed)
March appointment as before please, call if you need me sooner  Rocephin given in office  Decongestant sent to help loosen sputum  Flush with saline twice daily your nostrils/sinuses  You are treated for sinusitis and bronchitis, TAKE ENTIRE COURSE of antibiotics so that they work

## 2015-09-20 NOTE — Assessment & Plan Note (Addendum)
Rocephin administered in office, cleocin prescribed an ddecongestants

## 2015-09-20 NOTE — Progress Notes (Signed)
   Subjective:    Patient ID: [name], female    DOB: 1940/09/28, 75 y.o.   MRN: [id number]  HPI 1 week h/o worsening head and chest congestion, associated with fever and chills intermittently. Nasal drainage has thickened , and is yellowish green, and at times bloody. Sputum is thick and yellow. C/o bilateral ear pressure, denies hearing loss and sore throat. Increasing fatigue , poor appetitie and sleep disturbed by cough. No improvement with OTC medication.   Review of Systems  See HPI  Denies chest pains, palpitations and leg swelling Denies abdominal pain, nausea, vomiting,diarrhea or constipation.   Denies dysuria, frequency, hesitancy or incontinence. Denies  Uncontrolled  joint pain, swelling and limitation in mobility. Denies headaches, seizures, numbness, or tingling. Denies depression, anxiety or insomnia. Denies skin break down or rash.      Objective:   Physical Exam BP 132/82 mmHg  Pulse 96  Temp(Src) 97.7 F (36.5 C) (Oral)  Resp 16  Ht 5\' 6"  (1.676 m)  Wt 218 lb 6.4 oz (99.066 kg)  BMI 35.27 kg/m2  SpO2 96%  Patient alert and oriented and in no cardiopulmonary distress.  HEENT: No facial asymmetry, EOMI,   oropharynx pink and moist.  Neck supple no JVD, bilateral anterior cervical adenitis, frontal and maxillary sinus tenderness, TM clear bilaterally.  Chest: adequate air entry, scattered crackles, no wheezes  CVS: S1, S2 no murmurs, no S3.Regular rate.  ABD: Soft non tender.   Ext: No edema  MS: Adequate ROM spine, shoulders, hips and knees.  Skin: Intact, no ulcerations or rash noted.  Psych: Good eye contact, normal affect. Memory intact not anxious or depressed appearing.  CNS: CN 2-12 intact, power,  normal throughout.no focal deficits noted.        Assessment & Plan:  Acute bronchitis Rocephin administered in office, cleocin prescribed an ddecongestants  Sinusitis, chronic 2 week course of cleocin prescribed, saline  flushes twice daily and specimen for c/s requested  Essential hypertension Controlled, no change in medication DASH diet and commitment to daily physical activity for a minimum of 30 minutes discussed and encouraged, as a part of hypertension management. The importance of attaining a healthy weight is also discussed.  BP/Weight 09/20/2015 09/01/2015 07/15/2015 07/14/2015 06/22/2015 03/23/2015 [id number]  Systolic BP Q000111Q Q000111Q 0000000 - [id number] [id number]  Diastolic BP 82 83 61 - 82 95 82  Wt. (Lbs) 218.4 215.2 - 218 218.12 215.2 211  BMI 35.27 34.75 - 35.2 35.22 34.75 34.57        GERD (gastroesophageal reflux disease) Controlled, no change in medication   Hyperlipidemia LDL goal <100 Hyperlipidemia:Low fat diet discussed and encouraged.   Lipid Panel  Lab Results  Component Value Date   CHOL 174 12/27/2014   HDL 44* 12/27/2014   LDLCALC 112* 12/27/2014   TRIG 89 12/27/2014   CHOLHDL 4.0 12/27/2014   Updated lab needed at/ before next visit. Uncontrolled, not at goal     [place] Patient counseled for approximately 5 minutes regarding the health risks of ongoing nicotine use, specifically all types of cancer, heart disease, stroke and respiratory failure. The options available for help with cessation ,the behavioral changes to assist the process, and the option to either gradully reduce usage  Or abruptly stop.is also discussed. Pt is also encouraged to set specific goals in number of cigarettes used daily, as well as to set a quit date.  Number of cigarettes/cigars currently smoking daily: 2 to 5

## 2015-09-23 LAB — RESPIRATORY CULTURE OR RESPIRATORY AND SPUTUM CULTURE: Organism ID, Bacteria: NORMAL

## 2015-09-25 NOTE — Assessment & Plan Note (Signed)
Controlled, no change in medication DASH diet and commitment to daily physical activity for a minimum of 30 minutes discussed and encouraged, as a part of hypertension management. The importance of attaining a healthy weight is also discussed.  BP/Weight 09/20/2015 09/01/2015 07/15/2015 07/14/2015 06/22/2015 03/23/2015 123456  Systolic BP Q000111Q Q000111Q 0000000 - [id number] [id number]  Diastolic BP 82 83 61 - 82 95 82  Wt. (Lbs) 218.4 215.2 - 218 218.12 215.2 211  BMI 35.27 34.75 - 35.2 35.22 34.75 34.57

## 2015-09-25 NOTE — Assessment & Plan Note (Signed)
Controlled, no change in medication  

## 2015-09-25 NOTE — Assessment & Plan Note (Signed)
Patient counseled for approximately 5 minutes regarding the health risks of ongoing nicotine use, specifically all types of cancer, heart disease, stroke and respiratory failure. The options available for help with cessation ,the behavioral changes to assist the process, and the option to either gradully reduce usage  Or abruptly stop.is also discussed. Pt is also encouraged to set specific goals in number of cigarettes used daily, as well as to set a quit date.  Number of cigarettes/cigars currently smoking daily: 2 to 5  

## 2015-09-25 NOTE — Assessment & Plan Note (Signed)
2 week course of cleocin prescribed, saline flushes twice daily and specimen for c/s requested

## 2015-09-25 NOTE — Assessment & Plan Note (Signed)
Hyperlipidemia:Low fat diet discussed and encouraged.   Lipid Panel  Lab Results  Component Value Date   CHOL 174 12/27/2014   HDL 44* 12/27/2014   LDLCALC 112* 12/27/2014   TRIG 89 12/27/2014   CHOLHDL 4.0 12/27/2014   Updated lab needed at/ before next visit. Uncontrolled, not at goal

## 2015-10-05 DIAGNOSIS — H52222 Regular astigmatism, left eye: Secondary | ICD-10-CM | POA: Diagnosis not present

## 2015-10-05 DIAGNOSIS — H5202 Hypermetropia, left eye: Secondary | ICD-10-CM | POA: Diagnosis not present

## 2015-10-05 DIAGNOSIS — H35032 Hypertensive retinopathy, left eye: Secondary | ICD-10-CM | POA: Diagnosis not present

## 2015-10-05 DIAGNOSIS — H524 Presbyopia: Secondary | ICD-10-CM | POA: Diagnosis not present

## 2015-10-06 ENCOUNTER — Other Ambulatory Visit: Payer: Self-pay | Admitting: Family Medicine

## 2015-10-06 DIAGNOSIS — E785 Hyperlipidemia, unspecified: Secondary | ICD-10-CM | POA: Diagnosis not present

## 2015-10-06 DIAGNOSIS — E559 Vitamin D deficiency, unspecified: Secondary | ICD-10-CM | POA: Diagnosis not present

## 2015-10-06 DIAGNOSIS — R7303 Prediabetes: Secondary | ICD-10-CM | POA: Diagnosis not present

## 2015-10-06 DIAGNOSIS — R7309 Other abnormal glucose: Secondary | ICD-10-CM | POA: Diagnosis not present

## 2015-10-06 DIAGNOSIS — I10 Essential (primary) hypertension: Secondary | ICD-10-CM | POA: Diagnosis not present

## 2015-10-06 LAB — CBC
HEMATOCRIT: 38.8 % (ref 36.0–46.0)
HEMOGLOBIN: 13.1 g/dL (ref 12.0–15.0)
MCH: 29.5 pg (ref 26.0–34.0)
MCHC: 33.8 g/dL (ref 30.0–36.0)
MCV: 87.4 fL (ref 78.0–100.0)
MPV: 10.9 fL (ref 8.6–12.4)
PLATELETS: 311 10*3/uL (ref 150–400)
RBC: 4.44 MIL/uL (ref 3.87–5.11)
RDW: 14.9 % (ref 11.5–15.5)
WBC: 7.5 10*3/uL (ref 4.0–10.5)

## 2015-10-07 LAB — BASIC METABOLIC PANEL
BUN: 9 mg/dL (ref 7–25)
CO2: 28 mmol/L (ref 20–31)
Calcium: 9 mg/dL (ref 8.6–10.4)
Chloride: 100 mmol/L (ref 98–110)
Creat: 0.84 mg/dL (ref 0.60–0.93)
GLUCOSE: 128 mg/dL — AB (ref 65–99)
POTASSIUM: 3.9 mmol/L (ref 3.5–5.3)
SODIUM: 140 mmol/L (ref 135–146)

## 2015-10-07 LAB — TSH: TSH: 0.8 m[IU]/L

## 2015-10-07 LAB — VITAMIN D 25 HYDROXY (VIT D DEFICIENCY, FRACTURES): VIT D 25 HYDROXY: 16 ng/mL — AB (ref 30–100)

## 2015-10-07 LAB — LIPID PANEL
CHOLESTEROL: 173 mg/dL (ref 125–200)
HDL: 45 mg/dL — ABNORMAL LOW (ref >=46)
LDL Cholesterol: 100 mg/dL (ref <130)
Total CHOL/HDL Ratio: 3.8 Ratio (ref <=5.0)
Triglycerides: 138 mg/dL (ref <150)
VLDL: 28 mg/dL (ref <30)

## 2015-10-10 LAB — HEPATIC FUNCTION PANEL
ALT: 12 U/L (ref 6–29)
AST: 15 U/L (ref 10–35)
Albumin: 3.7 g/dL (ref 3.6–5.1)
Alkaline Phosphatase: 87 U/L (ref 33–130)
BILIRUBIN DIRECT: 0.1 mg/dL (ref <=0.2)
Indirect Bilirubin: 0.4 mg/dL (ref 0.2–1.2)
Total Bilirubin: 0.5 mg/dL (ref 0.2–1.2)
Total Protein: 7 g/dL (ref 6.1–8.1)

## 2015-10-10 LAB — HEMOGLOBIN A1C
HEMOGLOBIN A1C: 6.3 % — AB (ref <5.7)
Mean Plasma Glucose: 134 mg/dL — ABNORMAL HIGH (ref <117)

## 2015-10-18 ENCOUNTER — Ambulatory Visit (INDEPENDENT_AMBULATORY_CARE_PROVIDER_SITE_OTHER): Payer: Medicare Other | Admitting: Family Medicine

## 2015-10-18 ENCOUNTER — Encounter: Payer: Self-pay | Admitting: Family Medicine

## 2015-10-18 VITALS — BP 130/82 | HR 87 | Resp 16 | Ht 66.0 in | Wt 214.0 lb

## 2015-10-18 DIAGNOSIS — F17200 Nicotine dependence, unspecified, uncomplicated: Secondary | ICD-10-CM

## 2015-10-18 DIAGNOSIS — J449 Chronic obstructive pulmonary disease, unspecified: Secondary | ICD-10-CM

## 2015-10-18 DIAGNOSIS — K219 Gastro-esophageal reflux disease without esophagitis: Secondary | ICD-10-CM

## 2015-10-18 DIAGNOSIS — K5909 Other constipation: Secondary | ICD-10-CM

## 2015-10-18 DIAGNOSIS — J3089 Other allergic rhinitis: Secondary | ICD-10-CM | POA: Diagnosis not present

## 2015-10-18 DIAGNOSIS — I10 Essential (primary) hypertension: Secondary | ICD-10-CM

## 2015-10-18 DIAGNOSIS — R7303 Prediabetes: Secondary | ICD-10-CM

## 2015-10-18 DIAGNOSIS — E8881 Metabolic syndrome: Secondary | ICD-10-CM

## 2015-10-18 NOTE — Assessment & Plan Note (Signed)
Controlled, no change in medication  

## 2015-10-18 NOTE — Assessment & Plan Note (Signed)
Deteriorated Patient educated about the importance of limiting  Carbohydrate intake , the need to commit to daily physical activity for a minimum of 30 minutes , and to commit weight loss. The fact that changes in all these areas will reduce or eliminate all together the development of diabetes is stressed.   Diabetic Labs Latest Ref Rng 10/06/2015 07/14/2015 12/27/2014 07/01/2014 04/08/2014  HbA1c <5.7 % 6.3(H) - 6.2(H) 6.3(H) -  Chol 125 - 200 mg/dL 173 - 174 191 -  HDL >=46 mg/dL 45(L) - 44(L) 54 -  Calc LDL <130 mg/dL 100 - 112(H) 112(H) -  Triglycerides <150 mg/dL 138 - 89 124 -  Creatinine 0.60 - 0.93 mg/dL 0.84 0.89 0.84 0.86 0.71   BP/Weight 10/18/2015 09/20/2015 09/01/2015 07/15/2015 07/14/2015 06/22/2015 [id number]  Systolic BP [id number] Q000111Q Q000111Q 0000000 - A999333 [id number]  Diastolic BP 82 82 83 61 - 82 95  Wt. (Lbs) 214 218.4 215.2 - 218 218.12 215.2  BMI 34.56 35.27 34.75 - 35.2 35.22 34.75   Foot/eye exam completion dates 05/02/2010  Foot exam Order yes  Foot Form Completion -

## 2015-10-18 NOTE — Assessment & Plan Note (Signed)
The increased risk of cardiovascular disease associated with this diagnosis, and the need to consistently work on lifestyle to change this is discussed. Following  a  heart healthy diet ,commitment to 30 minutes of exercise at least 5 days per week, as well as control of blood sugar and cholesterol , and achieving a healthy weight are all the areas to be addressed .  

## 2015-10-18 NOTE — Assessment & Plan Note (Signed)

## 2015-10-18 NOTE — Assessment & Plan Note (Signed)
Controlled, no change in medication DASH diet and commitment to daily physical activity for a minimum of 30 minutes discussed and encouraged, as a part of hypertension management. The importance of attaining a healthy weight is also discussed.  BP/Weight 10/18/2015 09/20/2015 09/01/2015 07/15/2015 07/14/2015 06/22/2015 [id number]  Systolic BP [id number] Q000111Q Q000111Q 0000000 - A999333 [id number]  Diastolic BP 82 82 83 61 - 82 95  Wt. (Lbs) 214 218.4 215.2 - 218 218.12 215.2  BMI 34.56 35.27 34.75 - 35.2 35.22 34.75

## 2015-10-18 NOTE — Assessment & Plan Note (Signed)
Controlled on PPI, managed by GI

## 2015-10-18 NOTE — Patient Instructions (Signed)
Annual physical exam in 4 month, call if you need me before  Please commit to exercise / physical activity for 30 minutes every day  One day decide NO MORE CIGARETTES!  Eat increasing amounts of fruit and vegetable , fresh/frozen  Thanks for choosing [facility], we consider it a privelige to serve you.  No labs for next visit

## 2015-10-18 NOTE — Progress Notes (Signed)
Subjective:    Patient ID: [name], female    DOB: 02-21-1941, 75 y.o.   MRN: [id number]  HPI   [name]     MRN: [id number]      DOB: [birth year]   HPI [name] is here for follow up and re-evaluation of chronic medical conditions, medication management and review of any available recent lab and radiology data.  Preventive health is updated, specifically  Cancer screening and Immunization.   Questions or concerns regarding consultations or procedures which the PT has had in the interim are  addressed. The PT denies any adverse reactions to current medications since the last visit.  There are no new concerns.  There are no specific complaints   ROS Denies recent fever or chills. Denies sinus pressure, nasal congestion, ear pain or sore throat. Denies chest congestion, productive cough or wheezing. Denies chest pains, palpitations and leg swelling Denies abdominal pain, nausea, vomiting,diarrhea or constipation.   Denies dysuria, frequency, hesitancy or incontinence. Denies joint pain, swelling and limitation in mobility. Denies headaches, seizures, numbness, or tingling. Denies depression, anxiety or insomnia. Denies skin break down or rash.   PE  BP 130/82 mmHg  Pulse 87  Resp 16  Ht 5\' 6"  (1.676 m)  Wt 214 lb (97.07 kg)  BMI 34.56 kg/m2  SpO2 97%  Patient alert and oriented and in no cardiopulmonary distress.  HEENT: No facial asymmetry, EOMI,   oropharynx pink and moist.  Neck supple no JVD, no mass.  Chest: Clear to auscultation bilaterally.  CVS: S1, S2 no murmurs, no S3.Regular rate.  ABD: Soft non tender.   Ext: No edema  MS: Adequate ROM spine, shoulders, hips and knees.  Skin: Intact, no ulcerations or rash noted.  Psych: Good eye contact, normal affect. Memory intact not anxious or depressed appearing.  CNS: CN 2-12 intact, power,  normal throughout.no focal deficits noted.   Assessment & Plan   Essential  hypertension Controlled, no change in medication DASH diet and commitment to daily physical activity for a minimum of 30 minutes discussed and encouraged, as a part of hypertension management. The importance of attaining a healthy weight is also discussed.  BP/Weight 10/18/2015 09/20/2015 09/01/2015 07/15/2015 07/14/2015 06/22/2015 [id number]  Systolic BP [id number] Q000111Q Q000111Q 0000000 - A999333 [id number]  Diastolic BP 82 82 83 61 - 82 95  Wt. (Lbs) 214 218.4 215.2 - 218 218.12 215.2  BMI 34.56 35.27 34.75 - 35.2 35.22 34.75        Allergic rhinitis Controlled, no change in medication   COPD (chronic obstructive pulmonary disease) Stable, needs to quit smoking  Constipation Good response to amitiza, pt to continue same  GERD (gastroesophageal reflux disease) Controlled on PPI, managed by GI  Prediabetes Deteriorated Patient educated about the importance of limiting  Carbohydrate intake , the need to commit to daily physical activity for a minimum of 30 minutes , and to commit weight loss. The fact that changes in all these areas will reduce or eliminate all together the development of diabetes is stressed.   Diabetic Labs Latest Ref Rng 10/06/2015 07/14/2015 12/27/2014 07/01/2014 04/08/2014  HbA1c <5.7 % 6.3(H) - 6.2(H) 6.3(H) -  Chol 125 - 200 mg/dL 173 - 174 191 -  HDL >=46 mg/dL 45(L) - 44(L) 54 -  Calc LDL <130 mg/dL 100 - 112(H) 112(H) -  Triglycerides <150 mg/dL 138 - 89 124 -  Creatinine 0.60 - 0.93 mg/dL 0.84 0.89 0.84 0.86 0.71   BP/Weight 10/18/2015 09/20/2015  09/01/2015 07/15/2015 07/14/2015 06/22/2015 [id number]  Systolic BP [id number] Q000111Q Q000111Q 0000000 - A999333 [id number]  Diastolic BP 82 82 83 61 - 82 95  Wt. (Lbs) 214 218.4 215.2 - 218 218.12 215.2  BMI 34.56 35.27 34.75 - 35.2 35.22 34.75   Foot/eye exam completion dates 05/02/2010  Foot exam Order yes  Foot Form Completion -       NICOTINE ADDICTION Patient counseled for approximately 5 minutes regarding the health risks of ongoing nicotine use,  specifically all types of cancer, heart disease, stroke and respiratory failure. The options available for help with cessation ,the behavioral changes to assist the process, and the option to either gradully reduce usage  Or abruptly stop.is also discussed. Pt is also encouraged to set specific goals in number of cigarettes used daily, as well as to set a quit date.  Number of cigarettes/cigars currently smoking daily: 2   Metabolic syndrome X The increased risk of cardiovascular disease associated with this diagnosis, and the need to consistently work on lifestyle to change this is discussed. Following  a  heart healthy diet ,commitment to 30 minutes of exercise at least 5 days per week, as well as control of blood sugar and cholesterol , and achieving a healthy weight are all the areas to be addressed .       Review of Systems     Objective:   Physical Exam        Assessment & Plan:

## 2015-10-18 NOTE — Assessment & Plan Note (Signed)
Stable, needs to quit smoking.  

## 2015-10-18 NOTE — Assessment & Plan Note (Signed)
Good response to amitiza, pt to continue same

## 2015-11-23 ENCOUNTER — Other Ambulatory Visit: Payer: Self-pay | Admitting: Gastroenterology

## 2016-01-23 ENCOUNTER — Encounter: Payer: Self-pay | Admitting: Gastroenterology

## 2016-01-27 ENCOUNTER — Other Ambulatory Visit: Payer: Self-pay | Admitting: Family Medicine

## 2016-01-27 DIAGNOSIS — Z1231 Encounter for screening mammogram for malignant neoplasm of breast: Secondary | ICD-10-CM

## 2016-02-13 ENCOUNTER — Ambulatory Visit (HOSPITAL_COMMUNITY)
Admission: RE | Admit: 2016-02-13 | Discharge: 2016-02-13 | Disposition: A | Discharge status: Home / Self Care | Payer: Medicare Other | Source: Ambulatory Visit | Attending: Family Medicine | Admitting: Family Medicine

## 2016-02-13 DIAGNOSIS — Z1231 Encounter for screening mammogram for malignant neoplasm of breast: Secondary | ICD-10-CM | POA: Diagnosis not present

## 2016-02-20 ENCOUNTER — Ambulatory Visit (INDEPENDENT_AMBULATORY_CARE_PROVIDER_SITE_OTHER): Payer: Medicare Other | Admitting: Family Medicine

## 2016-02-20 ENCOUNTER — Encounter: Payer: Self-pay | Admitting: Family Medicine

## 2016-02-20 ENCOUNTER — Other Ambulatory Visit: Payer: Self-pay

## 2016-02-20 VITALS — BP 126/80 | HR 98 | Resp 18 | Ht 66.0 in | Wt 208.0 lb

## 2016-02-20 DIAGNOSIS — R7303 Prediabetes: Secondary | ICD-10-CM | POA: Diagnosis not present

## 2016-02-20 DIAGNOSIS — E669 Obesity, unspecified: Secondary | ICD-10-CM

## 2016-02-20 DIAGNOSIS — E559 Vitamin D deficiency, unspecified: Secondary | ICD-10-CM

## 2016-02-20 DIAGNOSIS — F17200 Nicotine dependence, unspecified, uncomplicated: Secondary | ICD-10-CM

## 2016-02-20 DIAGNOSIS — I10 Essential (primary) hypertension: Secondary | ICD-10-CM

## 2016-02-20 DIAGNOSIS — Z0000 Encounter for general adult medical examination without abnormal findings: Secondary | ICD-10-CM | POA: Diagnosis not present

## 2016-02-20 LAB — COMPLETE METABOLIC PANEL WITH GFR
ALT: 10 U/L (ref 6–29)
AST: 13 U/L (ref 10–35)
Albumin: 4 g/dL (ref 3.6–5.1)
Alkaline Phosphatase: 93 U/L (ref 33–130)
BUN: 11 mg/dL (ref 7–25)
CALCIUM: 9.1 mg/dL (ref 8.6–10.4)
CHLORIDE: 103 mmol/L (ref 98–110)
CO2: 30 mmol/L (ref 20–31)
CREATININE: 0.86 mg/dL (ref 0.60–0.93)
GFR, Est African American: 78 mL/min (ref >=60)
GFR, Est Non African American: 67 mL/min (ref >=60)
Glucose, Bld: 105 mg/dL — ABNORMAL HIGH (ref 65–99)
Potassium: 4 mmol/L (ref 3.5–5.3)
Sodium: 141 mmol/L (ref 135–146)
Total Bilirubin: 0.4 mg/dL (ref 0.2–1.2)
Total Protein: 7 g/dL (ref 6.1–8.1)

## 2016-02-20 LAB — HEMOGLOBIN A1C
Hgb A1c MFr Bld: 6.4 % — ABNORMAL HIGH (ref <5.7)
MEAN PLASMA GLUCOSE: 137 mg/dL

## 2016-02-20 MED ORDER — BUPROPION HCL ER (SR) 100 MG PO TB12: 100.0000 mg | ORAL_TABLET | Freq: Two times a day (BID) | ORAL | 3 refills | Status: DC

## 2016-02-20 MED ORDER — PRAVASTATIN SODIUM 40 MG PO TABS: ORAL_TABLET | ORAL | 5 refills | Status: DC

## 2016-02-20 MED ORDER — FLUTICASONE PROPIONATE 50 MCG/ACT NA SUSP: 2.0000 | Freq: Every day | NASAL | 5 refills | Status: DC | PRN

## 2016-02-20 MED ORDER — HYDROCHLOROTHIAZIDE 25 MG PO TABS: ORAL_TABLET | ORAL | 5 refills | Status: DC

## 2016-02-20 NOTE — Assessment & Plan Note (Signed)
Patient counseled for approximately 5 minutes regarding the health risks of ongoing nicotine use, specifically all types of cancer, heart disease, stroke and respiratory failure. The options available for help with cessation ,the behavioral changes to assist the process, and the option to either gradully reduce usage  Or abruptly stop.is also discussed. Pt is also encouraged to set specific goals in number of cigarettes used daily, as well as to set a quit date. Will start wellbutrin, hope is to quit in 2 weeks

## 2016-02-20 NOTE — Assessment & Plan Note (Signed)
Improved. Patient re-educated about  the importance of commitment to a  minimum of 150 minutes of exercise per week.  The importance of healthy food choices with portion control discussed. Encouraged to start a food diary, count calories and to consider  joining a support group. Sample diet sheets offered. Goals set by the patient for the next several months.   Weight /BMI 02/20/2016 10/18/2015 09/20/2015  WEIGHT 208 lb 214 lb 218 lb 6.4 oz  HEIGHT 5\' 6"  5\' 6"  5\' 6"   BMI 33.57 kg/m2 34.56 kg/m2 35.27 kg/m2

## 2016-02-20 NOTE — Patient Instructions (Addendum)
Annual physical exam first week in December, call if you need me sooner  New is wellbutrin twice daily, plan to stop smoking 2 weeks after you start the medication  Congrats on weight loss due to change in eating habits, keep it up  Commit to DAILY flonase for control of sinuses  Meds , HCTZ, flonase and albuterol are refilled  It is important that you exercise regularly at least 30 minutes 5 times a week. If you develop chest pain, have severe difficulty breathing, or feel very tired, stop exercising immediately and seek medical attention    Labs today  Advance Directive Advance directives are the legal documents that allow you to make choices about your health care and medical treatment if you cannot speak for yourself. Advance directives are a way for you to communicate your wishes to family, friends, and health care providers. The specified people can then convey your decisions about end-of-life care to avoid confusion if you should become unable to communicate. Ideally, the process of discussing and writing advance directives should happen over time rather than making decisions all at once. Advance directives can be modified as your situation changes, and you can change your mind at any time, even after you have signed the advance directives. Each state has its own laws regarding advance directives. You may want to check with your health care provider, attorney, or state representative about the law in your state. Below are some examples of advance directives. LIVING WILL A living will is a set of instructions documenting your wishes about medical care when you cannot care for yourself. It is used if you become:  Terminally ill.  Incapacitated.  Unable to communicate.  Unable to make decisions. Items to consider in your living will include:  The use or non-use of life-sustaining equipment, such as dialysis machines and breathing machines (ventilators).  A do not resuscitate  (DNR) order, which is the instruction not to use cardiopulmonary resuscitation (CPR) if breathing or heartbeat stops.  Tube feeding.  Withholding of food and fluids.  Comfort (palliative) care when the goal becomes comfort rather than a cure.  Organ and tissue donation. A living will does not give instructions about distribution of your money and property if you should pass away. It is advisable to seek the expert advice of a lawyer in drawing up a will regarding your possessions. Decisions about taxes, beneficiaries, and asset distribution will be legally binding. This process can relieve your family and friends of any burdens surrounding disputes or questions that may come up about the allocation of your assets. DO NOT RESUSCITATE (DNR) A do not resuscitate (DNR) order is a request to not have CPR in the event that your heart stops beating or you stop breathing. Unless given other instructions, a health care provider will try to help any patient whose heart has stopped or who has stopped breathing.  HEALTH CARE PROXY AND DURABLE POWER OF ATTORNEY FOR HEALTH CARE A health care proxy is a person (agent) appointed to make medical decisions for you if you cannot. Generally, people choose someone they know well and trust to represent their preferences when they can no longer do so. You should be sure to ask this person for agreement to act as your agent. An agent may have to exercise judgment in the event of a medical decision for which your wishes are not known. The durable power of attorney for health care is the legal document that names your health care proxy. Once written,  it should be:  Signed.  Notarized.  Dated.  Copied.  Witnessed.  Incorporated into your medical record. You may also want to appoint someone to manage your financial affairs if you cannot. This is called a durable power of attorney for finances. It is a separate legal document from the durable power of attorney for  health care. You may choose the same person or someone different from your health care proxy to act as your agent in financial matters.   This information is not intended to replace advice given to you by your health care provider. Make sure you discuss any questions you have with your health care provider.   Document Released: 10/16/2007 Document Revised: 07/14/2013 Document Reviewed: 11/26/2012 Elsevier Interactive Patient Education [employer].

## 2016-02-20 NOTE — Progress Notes (Signed)
Preventive Screening-Counseling & Management   Patient present here today for a Medicare annual wellness visit.   Current Problems (verified)   Medications Prior to Visit Allergies (verified)   PAST HISTORY  Family History (updated)   Social History Retired, widowed, mother of 4 currently helps to babysit her grandchildren    Risk Factors  Current exercise habits: Limited due to time restraints.  Keeps grandchildren most of the day    Dietary issues discussed:  Heart healthy low fat diet encouraged with increase in fruits and vegetables    Cardiac risk factors:   Depression Screen  (Note: if answer to either of the following is "Yes", a more complete depression screening is indicated)   Over the past two weeks, have you felt down, depressed or hopeless? No  Over the past two weeks, have you felt little interest or pleasure in doing things? No  Have you lost interest or pleasure in daily life? No  Do you often feel hopeless? No  Do you cry easily over simple problems? No   Activities of Daily Living  In your present state of health, do you have any difficulty performing the following activities?  Driving?: No Managing money?: No Feeding yourself?:No Getting from bed to chair?:No Climbing a flight of stairs?: Yes at times due to back pain  Preparing food and eating?:No Bathing or showering?:No Getting dressed?:No Getting to the toilet?:No Using the toilet?:No Moving around from place to place?: No  Fall Risk Assessment In the past year have you fallen or had a near fall?:No Are you currently taking any medications that make you dizziness?:No   Hearing Difficulties: No Do you often ask people to speak up or repeat themselves?:No Do you experience ringing or noises in your ears?:No Do you have difficulty understanding soft or whispered voices?:No  Cognitive Testing  Alert? Yes Normal Appearance?Yes  Oriented to person? Yes Place? Yes  Time? Yes  Displays  appropriate judgment?Yes  Can read the correct time from a watch face? yes Are you having problems remembering things?No  Advanced Directives have been discussed with the patient?Yes and brochure/forms provided to patient    List the Names of Other Physician/Practitioners you currently use:  careteams up to date   Indicate any recent Medical Services you may have received from other than [facility] providers in the past year (date may be approximate).     Medicare Attestation  I have personally reviewed:  The patient's medical and social history  Their use of alcohol, tobacco or illicit drugs  Their current medications and supplements  The patient's functional ability including ADLs,fall risks, home safety risks, cognitive, and hearing and visual impairment  Diet and physical activities  Evidence for depression or mood disorders  The patient's weight, height, BMI, and visual acuity have been recorded in the chart. I have made referrals, counseling, and provided education to the patient based on review of the above and I have provided the patient with a written personalized care plan for preventive services.    Physical Exam BP 126/80 (BP Location: Left Arm, Patient Position: Sitting, Cuff Size: Large)   Pulse 98   Resp 18   Ht 5\' 6"  (1.676 m)   Wt 208 lb (94.3 kg)   SpO2 98%   BMI 33.57 kg/m    Assessment & Plan:  Medicare annual wellness visit, subsequent Annual exam as documented. Counseling done  re healthy lifestyle involving commitment to 150 minutes exercise per week, heart healthy diet, and attaining healthy weight.The importance  of adequate sleep also discussed. Regular seat belt use and home safety, is also discussed. Changes in health habits are decided on by the patient with goals and time frames  set for achieving them. Immunization and cancer screening needs are specifically addressed at this visit.   NICOTINE ADDICTION Patient counseled for approximately 5  minutes regarding the health risks of ongoing nicotine use, specifically all types of cancer, heart disease, stroke and respiratory failure. The options available for help with cessation ,the behavioral changes to assist the process, and the option to either gradully reduce usage  Or abruptly stop.is also discussed. Pt is also encouraged to set specific goals in number of cigarettes used daily, as well as to set a quit date. Will start wellbutrin, hope is to quit in 2 weeks     Obesity Improved. Patient re-educated about  the importance of commitment to a  minimum of 150 minutes of exercise per week.  The importance of healthy food choices with portion control discussed. Encouraged to start a food diary, count calories and to consider  joining a support group. Sample diet sheets offered. Goals set by the patient for the next several months.   Weight /BMI 02/20/2016 10/18/2015 09/20/2015  WEIGHT 208 lb 214 lb 218 lb 6.4 oz  HEIGHT 5\' 6"  5\' 6"  5\' 6"   BMI 33.57 kg/m2 34.56 kg/m2 35.27 kg/m2

## 2016-02-20 NOTE — Assessment & Plan Note (Signed)

## 2016-02-20 NOTE — Addendum Note (Signed)
Addended by: Eual Fines on: 02/20/2016 10:24 AM   Modules accepted: Orders

## 2016-02-21 LAB — VITAMIN D 25 HYDROXY (VIT D DEFICIENCY, FRACTURES): Vit D, 25-Hydroxy: 27 ng/mL — ABNORMAL LOW (ref 30–100)

## 2016-03-15 ENCOUNTER — Ambulatory Visit (INDEPENDENT_AMBULATORY_CARE_PROVIDER_SITE_OTHER): Payer: Medicare Other | Admitting: Gastroenterology

## 2016-03-15 ENCOUNTER — Encounter: Payer: Self-pay | Admitting: Gastroenterology

## 2016-03-15 DIAGNOSIS — K5901 Slow transit constipation: Secondary | ICD-10-CM

## 2016-03-15 DIAGNOSIS — R140 Abdominal distension (gaseous): Secondary | ICD-10-CM | POA: Diagnosis not present

## 2016-03-15 NOTE — Progress Notes (Signed)
Subjective:    Patient ID: [name], female    DOB: 08-10-1940, 75 y.o.   MRN: [id number]  Laura Nakayama, MD  HPI Still DEALING WITH TROUBLED GREAT GRANDCHILDREN-TROUBLE. MOTHER DESERTED THEM. TROUBLE WITH CONSTIPATION. BMS: IF SHE TAKES SOMETHING. OFF AMITIZA FOR PAST MO. CAN'T AFFORD CO-PAY $45. BMs: GOOD BM-YESTERDAY AFTER MILK AND AM PILL. DRINKING MILK WITH CIB. LOST 18 LBS SINCE FEB 2017. EATING STEAMED VEGETABLE AND FRUITS.  PT DENIES FEVER, CHILLS, HEMATOCHEZIA, HEMATEMESIS, nausea, vomiting, melena, diarrhea, CHEST PAIN, SHORTNESS OF BREATH,  CHANGE IN BOWEL IN HABITS, constipation, abdominal pain, problems swallowing, problems with sedation, heartburn or indigestion.   Past Medical History:  Diagnosis Date  . COPD (chronic obstructive pulmonary disease) ([place])   . Hypertension 2004  . Lung nodule seen on imaging study   . Nicotine addiction   . OA (osteoarthritis) of knee   . Obesity   . Sinusitis     Past Surgical History:  Procedure Laterality Date  . bilateral cataract surgery  7/27 & 03/01/09   Dr. Gershon Crane    . cholecystectomy    . COLONOSCOPY  04/2004   Dr. Leane Para Smith-->melanosis coli  . COLONOSCOPY WITH ESOPHAGOGASTRODUODENOSCOPY (EGD) N/A 02/25/2013   Dr. Wallace Cogliano:Normal mucosa in the terminal ileum/Severe melanosis throughout the entire examined colon/ONE COLON POLYP REMOVED (tubular adenoma)/Mild diverticulosis  in the sigmoid colon/EGD:The mucosa of the esophagus appeared normal/Non-erosive gastritis, empiric dilation with Savary dilator  . ESOPHAGOGASTRODUODENOSCOPY  11/07/2010   Jenkins:hiatal hernia/no evidence of Barrett esophagitis.  The Z-line was noted to be at 39 cm from the teeth. CLO test negative  . ESOPHAGOGASTRODUODENOSCOPY N/A 09/11/2013   Dr.Charlane Westry-  incomplete esophageal web in mid-esophagus, medium sized hialtal hernia, PUD. bx=focal erosion with inflammation and fibrosis  . ESOPHAGOGASTRODUODENOSCOPY N/A 07/06/2014   Procedure:  ESOPHAGOGASTRODUODENOSCOPY (EGD);  Surgeon: Danie Binder, MD;  Location: [facility];  Service: Endoscopy;  Laterality: N/A;  . knee arthroscopy right    . LEFT HEART CATHETERIZATION WITH CORONARY ANGIOGRAM N/A 04/08/2014   Procedure: LEFT HEART CATHETERIZATION WITH CORONARY ANGIOGRAM;  Surgeon: Sinclair Grooms, MD;  Location: [facility] CATH LAB;  Service: Cardiovascular;  Laterality: N/A;  . MALONEY DILATION N/A 02/25/2013   Procedure: Venia Minks DILATION;  Surgeon: Danie Binder, MD;  Location: [facility];  Service: Endoscopy;  Laterality: N/A;  . PARTIAL HYSTERECTOMY    . SAVORY DILATION N/A 02/25/2013   Procedure: SAVORY DILATION;  Surgeon: Danie Binder, MD;  Location: [facility];  Service: Endoscopy;  Laterality: N/A;  . total knee arthroplasty right  05/29/2005   Dr. Aline Brochure     No Known Allergies  Current Outpatient Prescriptions  Medication Sig Dispense Refill  . albuterol (PROVENTIL HFA;VENTOLIN HFA) 108 (90 BASE) MCG/ACT inhaler Inhale 2-4 puffs into the lungs every 4 (four) hours as needed for wheezing or shortness of breath. 1 Inhaler 0  . ALOE VERA PO Take 500 mg by mouth daily.    Marland Kitchen aspirin EC 81 MG tablet Take 81 mg by mouth daily.    . Calcium Carbonate-Vitamin D (CALCIUM + D PO) Take 1 tablet by mouth daily.    . Cholecalciferol 400 units CAPS Take 1 capsule by mouth daily.    . fluticasone (FLONASE) 50 MCG/ACT nasal spray Place 2 sprays into both nostrils daily as needed for allergies or rhinitis.    . hydrochlorothiazide (HYDRODIURIL) 25 MG tablet TAKE 1 TABLET(25 MG) BY MOUTH DAILY    . omeprazole (PRILOSEC) 20 MG capsule  TAKE 1 CAPSULE BY MOUTH 30 MINUTES PRIOR TO BREAKFAST FOREVER    . pravastatin (PRAVACHOL) 40 MG tablet TAKE 1 TABLET(40 MG) BY MOUTH AT BEDTIME    . Red Yeast Rice 600 MG CAPS Take 1 capsule by mouth daily.    . sodium chloride (OCEAN) 0.65 % SOLN nasal spray Place 1 spray into both nostrils as needed for congestion.    Marland Kitchen buPROPion (WELLBUTRIN SR)  100 MG 12 hr tablet Take 1 tablet (100 mg total) by mouth 2 (two) times daily. (Patient not taking: Reported on 03/15/2016)    .      . vitamin C (ASCORBIC ACID) 500 MG tablet Take 500 mg by mouth daily.     Review of Systems PER HPI OTHERWISE ALL SYSTEMS ARE NEGATIVE.    Objective:   Physical Exam  Constitutional: She is oriented to person, place, and time. She appears well-developed and well-nourished. No distress.  HENT:  Head: Normocephalic and atraumatic.  Mouth/Throat: Oropharynx is clear and moist. No oropharyngeal exudate.  Eyes: Pupils are equal, round, and reactive to light. No scleral icterus.  Neck: Normal range of motion. Neck supple.  Cardiovascular: Normal rate, regular rhythm and normal heart sounds.   Pulmonary/Chest: Effort normal and breath sounds normal. No respiratory distress.  Abdominal: Soft. Bowel sounds are normal. She exhibits no distension. There is no tenderness.  Musculoskeletal: She exhibits no edema.  Lymphadenopathy:    She has no cervical adenopathy.  Neurological: She is alert and oriented to person, place, and time.  Psychiatric: She has a normal mood and affect.  Vitals reviewed.     Assessment & Plan:

## 2016-03-15 NOTE — Progress Notes (Signed)
cc'ed to pcp °

## 2016-03-15 NOTE — Patient Instructions (Addendum)
CONTINUE YOUR WEIGHT LOSS EFFORTS.  USE AMITIZA TWICE DAILY. IT TREATS CONSTIPATION BUT MAY CAUSE NAUSEA. PICK UP SAMPLES WHEN YOU CAN.  DRINK WATER TO KEEP YOUR URINE LIGHT YELLOW.  FOLLOW A HIGH FIBER DIET.   AVOID ITEMS THAT CAUSE BLOATING & GAS. See info below.  Substitute soy or almond for regular milk to AVOID GAS AND BLOATING. AS WELL YOU COULD TAKE 3 LACTASE PILLS WITH MEALS UP TO THREE TIMES A DAY.  FOLLOW UP IN 4 MOS.    BLOATING AND GAS PREVENTION  Although gas may be uncomfortable and embarrassing, it is not life-threatening. Understanding causes, ways to reduce symptoms, and treatment will help most people find some relief. Points to remember . Everyone has gas in the digestive tract. Marland Kitchen People often believe normal passage of gas to be excessive. . Gas comes from two main sources: swallowed air and normal breakdown of certain foods by harmless bacteria naturally present in the large intestine. . Many foods with carbohydrates can cause gas. Fats and proteins cause little gas. . Foods that may cause gas include o beans  o vegetables, such as broccoli, cabbage, brussels sprouts, onions, artichokes, and asparagus  o fruits, such as pears, apples, and peaches  o whole grains, such as whole wheat and bran  o soft drinks and fruit drinks  o milk and milk products, such as cheese and ice cream, and packaged foods prepared with lactose, such as bread, cereal, and salad dressing  o foods containing sorbitol, such as dietetic foods and sugar free candies and gums  . The most common symptoms of gas are belching, flatulence, bloating, and abdominal pain. However, some of these symptoms are often caused by an intestinal disorder, such as irritable bowel syndrome, rather than too much gas.  . The most common ways to reduce the discomfort of gas are changing diet, taking nonprescription medicines, and reducing the amount of air swallowed.  . Digestive enzymes, such as lactase  supplements, actually help digest carbohydrates and may allow people to eat foods that normally cause gas.

## 2016-03-15 NOTE — Assessment & Plan Note (Signed)
SYMPTOMS NOT CONTROLLED. MOST LIKELY DUE TO FIBER AND MILK AND CONSTIPATION NOT BEING IDEALLY CONTROLLED.   CONTINUE YOUR WEIGHT LOSS EFFORTS. USE AMITIZA TWICE DAILY. IT TREATS CONSTIPATION BUT MAY CAUSE NAUSEA. DRINK WATER TO KEEP YOUR URINE LIGHT YELLOW. FOLLOW A HIGH FIBER DIET.  AVOID ITEMS THAT CAUSE BLOATING & GAS.  HANDOUT GIVEN. Substitute soy or almond for regular milk to AVOID GAS AND BLOATING. AS WELL YOU COULD TAKE 3 LACTASE PILLS WITH MEALS UP TO THREE TIMES A DAY. FOLLOW UP IN 4 MOS.

## 2016-03-15 NOTE — Assessment & Plan Note (Signed)
SYMPTOMS NOT IDEALLY CONTROLLED OFF AMITIZA.  AMITIZA SAMPLES GIVEN. USE AMITIZA TWICE DAILY. IT TREATS CONSTIPATION BUT MAY CAUSE NAUSEA. DRINK WATER TO KEEP YOUR URINE LIGHT YELLOW. FOLLOW A HIGH FIBER DIET.  FOLLOW UP IN 4 MOS.

## 2016-03-19 NOTE — Progress Notes (Signed)
ON RECALL  °

## 2016-04-20 ENCOUNTER — Encounter: Payer: Self-pay | Admitting: Orthopedic Surgery

## 2016-04-20 ENCOUNTER — Ambulatory Visit (INDEPENDENT_AMBULATORY_CARE_PROVIDER_SITE_OTHER): Payer: Medicare Other

## 2016-04-20 ENCOUNTER — Ambulatory Visit (INDEPENDENT_AMBULATORY_CARE_PROVIDER_SITE_OTHER): Payer: Medicare Other | Admitting: Orthopedic Surgery

## 2016-04-20 VITALS — BP 162/100 | HR 97 | Wt 204.0 lb

## 2016-04-20 DIAGNOSIS — M1712 Unilateral primary osteoarthritis, left knee: Secondary | ICD-10-CM | POA: Diagnosis not present

## 2016-04-20 DIAGNOSIS — M25562 Pain in left knee: Secondary | ICD-10-CM

## 2016-04-20 NOTE — Progress Notes (Signed)
Patient ID: [name], female   DOB: 1941-01-07, 75 y.o.   MRN: [id number]  Chief Complaint  Patient presents with  . Knee Pain    recurring left knee pain    HPI [name] is a 75 y.o. female.  Patient is for evaluation of swelling left knee which started about 2 weeks ago. However she had a right total knee so she knew how to exercise the knee. She put ice took Aleve did exercise and the swelling went down but she wanted to get it checked out anyway  She denies severe pain in the left knee although she has some medial joint discomfort with long periods of standing. The swelling noted has gone away. She's not having any walking difficulties. And the pain was mild except for standing  Review of Systems Review of Systems  Constitutional: Negative for chills, fatigue and fever.  Musculoskeletal: Positive for arthralgias and joint swelling. Negative for gait problem.  Neurological: Negative for weakness and numbness.     Past Medical History:  Diagnosis Date  . COPD (chronic obstructive pulmonary disease) ([place])   . Hypertension 2004  . Lung nodule seen on imaging study   . Nicotine addiction   . OA (osteoarthritis) of knee   . Obesity   . Sinusitis     Past Surgical History:  Procedure Laterality Date  . bilateral cataract surgery  7/27 & 03/01/09   Dr. Gershon Crane    . cholecystectomy    . COLONOSCOPY  04/2004   Dr. Leane Para Smith-->melanosis coli  . COLONOSCOPY WITH ESOPHAGOGASTRODUODENOSCOPY (EGD) N/A 02/25/2013   Dr. Fields:Normal mucosa in the terminal ileum/Severe melanosis throughout the entire examined colon/ONE COLON POLYP REMOVED (tubular adenoma)/Mild diverticulosis  in the sigmoid colon/EGD:The mucosa of the esophagus appeared normal/Non-erosive gastritis, empiric dilation with Savary dilator  . ESOPHAGOGASTRODUODENOSCOPY  11/07/2010   Jenkins:hiatal hernia/no evidence of Barrett esophagitis.  The Z-line was noted to be at 39 cm from the teeth. CLO test negative  .  ESOPHAGOGASTRODUODENOSCOPY N/A 09/11/2013   Dr.Fields-  incomplete esophageal web in mid-esophagus, medium sized hialtal hernia, PUD. bx=focal erosion with inflammation and fibrosis  . ESOPHAGOGASTRODUODENOSCOPY N/A 07/06/2014   Procedure: ESOPHAGOGASTRODUODENOSCOPY (EGD);  Surgeon: Danie Binder, MD;  Location: [facility];  Service: Endoscopy;  Laterality: N/A;  . knee arthroscopy right    . LEFT HEART CATHETERIZATION WITH CORONARY ANGIOGRAM N/A 04/08/2014   Procedure: LEFT HEART CATHETERIZATION WITH CORONARY ANGIOGRAM;  Surgeon: Sinclair Grooms, MD;  Location[facility] CATH LAB;  Service: Cardiovascular;  Laterality: N/A;  . MALONEY DILATION N/A 02/25/2013   Procedure: Venia Minks DILATION;  Surgeon: Danie Binder, MD;  Location: [facility];  Service: Endoscopy;  Laterality: N/A;  . PARTIAL HYSTERECTOMY    . SAVORY DILATION N/A 02/25/2013   Procedure: SAVORY DILATION;  Surgeon: Danie Binder, MD;  Location: [facility];  Service: Endoscopy;  Laterality: N/A;  . total knee arthroplasty right  05/29/2005   Dr. Aline Brochure     Social History Social History  Substance Use Topics  . Smoking status: Current Some Day Smoker    Packs/day: 0.30    Years: 10.00    Types: Cigarettes    Start date: 07/23/1962  . Smokeless tobacco: Never Used     Comment: smokes 1 or 2 per day  . Alcohol use No    No Known Allergies  Current Meds  Medication Sig  . albuterol (PROVENTIL HFA;VENTOLIN HFA) 108 (90 BASE) MCG/ACT inhaler Inhale 2-4 puffs  into the lungs every 4 (four) hours as needed for wheezing or shortness of breath.  . ALOE VERA PO Take 500 mg by mouth daily.  Marland Kitchen aspirin EC 81 MG tablet Take 81 mg by mouth daily.  Marland Kitchen buPROPion (WELLBUTRIN SR) 100 MG 12 hr tablet Take 1 tablet (100 mg total) by mouth 2 (two) times daily.  . Calcium Carbonate-Vitamin D (CALCIUM + D PO) Take 1 tablet by mouth daily.  . Cholecalciferol 400 units CAPS Take 1 capsule by mouth daily.  . fluticasone (FLONASE) 50 MCG/ACT nasal  spray Place 2 sprays into both nostrils daily as needed for allergies or rhinitis.  . hydrochlorothiazide (HYDRODIURIL) 25 MG tablet TAKE 1 TABLET(25 MG) BY MOUTH DAILY  . lubiprostone (AMITIZA) 24 MCG capsule Take 1 capsule (24 mcg total) by mouth 2 (two) times daily with a meal.  . omeprazole (PRILOSEC) 20 MG capsule TAKE 1 CAPSULE BY MOUTH 30 MINUTES PRIOR TO BREAKFAST FOREVER  . pravastatin (PRAVACHOL) 40 MG tablet TAKE 1 TABLET(40 MG) BY MOUTH AT BEDTIME  . Red Yeast Rice 600 MG CAPS Take 1 capsule by mouth daily.  . sodium chloride (OCEAN) 0.65 % SOLN nasal spray Place 1 spray into both nostrils as needed for congestion.  . vitamin C (ASCORBIC ACID) 500 MG tablet Take 500 mg by mouth daily.      Physical Exam Physical Exam  Constitutional: She is oriented to person, place, and time. She appears well-developed and well-nourished.  HENT:  Head: Normocephalic and atraumatic.  Neck: Neck supple. No tracheal deviation present.  Cardiovascular: Intact distal pulses.   Neurological: She is alert and oriented to person, place, and time. She displays normal reflexes. No cranial nerve deficit. She exhibits normal muscle tone. Coordination normal.  Skin: Skin is warm and dry.  Psychiatric: She has a normal mood and affect. Her behavior is normal. Judgment and thought content normal.   BP (!) 162/100   Pulse 97   Wt 204 lb (92.5 kg)   BMI 32.93 kg/m   Gen. appearance. The patient is well-developed and well-nourished, grooming and hygiene are normal. There are no gross congenital abnormalities  The patient is alert and oriented to person place and time  Mood and affect are normal  Ambulation remains normal  Examination reveals the following: On inspection we find mild varus of the left knee  With the range of motion of  125  Stability tests were normal    Strength tests revealed grade 5 motor strength  Skin we find no rash ulceration or erythema  Sensation remains  intact  Impression vascular system shows no peripheral edema  Data Reviewed X-rays show varus knee severe arthritis medial compartment. X-ray read and ordered and independently reviewed in the office  Assessment    Encounter Diagnoses  Name Primary?  . Left knee pain Yes  . Primary osteoarthritis of left knee        Plan    Follow-up as needed she's not having any significant pain is very functional walking normally no interventions needed       [employer] 04/20/2016, 11:11 AM

## 2016-05-28 ENCOUNTER — Encounter: Payer: Self-pay | Admitting: Gastroenterology

## 2016-06-11 ENCOUNTER — Ambulatory Visit (INDEPENDENT_AMBULATORY_CARE_PROVIDER_SITE_OTHER): Payer: Medicare Other

## 2016-06-11 DIAGNOSIS — Z23 Encounter for immunization: Secondary | ICD-10-CM | POA: Diagnosis not present

## 2016-07-04 ENCOUNTER — Ambulatory Visit (INDEPENDENT_AMBULATORY_CARE_PROVIDER_SITE_OTHER): Payer: Medicare Other | Admitting: Family Medicine

## 2016-07-04 ENCOUNTER — Encounter: Payer: Self-pay | Admitting: Family Medicine

## 2016-07-04 VITALS — BP 114/80 | HR 104 | Resp 16 | Ht 66.0 in | Wt 207.8 lb

## 2016-07-04 DIAGNOSIS — R7303 Prediabetes: Secondary | ICD-10-CM | POA: Diagnosis not present

## 2016-07-04 DIAGNOSIS — Z0000 Encounter for general adult medical examination without abnormal findings: Secondary | ICD-10-CM

## 2016-07-04 DIAGNOSIS — F17200 Nicotine dependence, unspecified, uncomplicated: Secondary | ICD-10-CM

## 2016-07-04 DIAGNOSIS — E785 Hyperlipidemia, unspecified: Secondary | ICD-10-CM | POA: Diagnosis not present

## 2016-07-04 DIAGNOSIS — J302 Other seasonal allergic rhinitis: Secondary | ICD-10-CM

## 2016-07-04 LAB — LIPID PANEL
Cholesterol: 186 mg/dL (ref <200)
HDL: 45 mg/dL — ABNORMAL LOW (ref >50)
LDL Cholesterol: 118 mg/dL — ABNORMAL HIGH (ref <100)
Total CHOL/HDL Ratio: 4.1 Ratio (ref <5.0)
Triglycerides: 117 mg/dL (ref <150)
VLDL: 23 mg/dL (ref <30)

## 2016-07-04 LAB — COMPREHENSIVE METABOLIC PANEL
ALBUMIN: 3.9 g/dL (ref 3.6–5.1)
ALT: 8 U/L (ref 6–29)
AST: 11 U/L (ref 10–35)
Alkaline Phosphatase: 95 U/L (ref 33–130)
BILIRUBIN TOTAL: 0.6 mg/dL (ref 0.2–1.2)
BUN: 12 mg/dL (ref 7–25)
CO2: 31 mmol/L (ref 20–31)
CREATININE: 0.88 mg/dL (ref 0.60–0.93)
Calcium: 9.1 mg/dL (ref 8.6–10.4)
Chloride: 102 mmol/L (ref 98–110)
Glucose, Bld: 115 mg/dL — ABNORMAL HIGH (ref 65–99)
Potassium: 4.6 mmol/L (ref 3.5–5.3)
SODIUM: 141 mmol/L (ref 135–146)
Total Protein: 7.4 g/dL (ref 6.1–8.1)

## 2016-07-04 MED ORDER — MONTELUKAST SODIUM 10 MG PO TABS: 10.0000 mg | ORAL_TABLET | Freq: Every day | ORAL | 3 refills | Status: DC

## 2016-07-04 NOTE — Patient Instructions (Signed)
F/u in 5 month, call if you need me sooner  Labs today  New medication, singulair, one daily to be used for allergies, continue flonase and flushes as before  Please work on good  health habits so that your health will improve. 1. Commitment to daily physical activity for 30 to 60  minutes, if you are able to do this.  2. Commitment to wise food choices. Aim for half of your  food intake to be vegetable and fruit, one quarter starchy foods, and one quarter protein. Try to eat on a regular schedule  3 meals per day, snacking between meals should be limited to vegetables or fruits or small portions of nuts. 64 ounces of water per day is generally recommended, unless you have specific health conditions, like heart failure or kidney failure where you will need to limit fluid intake.  3. Commitment to sufficient and a  good quality of physical and mental rest daily, generally between 6 to 8 hours per day.  WITH PERSISTANCE AND PERSEVERANCE, THE IMPOSSIBLE , BECOMES THE NORM!  Thank you  for choosing [facility]. We consider it a privelige to serve you.  Delivering excellent health care in a caring and  compassionate way is our goal.  Partnering with you,  so that together we can achieve this goal is our strategy.

## 2016-07-04 NOTE — Assessment & Plan Note (Signed)

## 2016-07-04 NOTE — Assessment & Plan Note (Signed)

## 2016-07-04 NOTE — Assessment & Plan Note (Signed)
Uncontrolled, add singulair daily

## 2016-07-04 NOTE — Progress Notes (Signed)
    [name]     MRN: [id number]      DOB: 1940-12-13  HPI: Patient is in for annual physical exam. C/o excess clear sinus drainage with tickle in throat and cough, no fever or chills, does flushes and uses flonase daily Recent labs, if available are reviewed. Immunization is reviewed , and  updated if needed.   PE: Pleasant  female, alert and oriented x 3, in no cardio-pulmonary distress. Afebrile. HEENT No facial trauma or asymetry. Sinuses non tender. Nasal mucosa erythematous and edematousExtra occullar muscles intact. External ears normal, tympanic membranes clear. Oropharynx moist, no exudate. Neck: supple, no adenopathy,JVD or thyromegaly.No bruits.  Chest: Clear to ascultation bilaterally.No crackles or wheezes. Non tender to palpation  Breast: No asymetry,no masses or lumps. No tenderness. No nipple discharge or inversion. No axillary or supraclavicular adenopathy  Cardiovascular system; Heart sounds normal,  S1 and  S2 ,no S3.  No murmur, or thrill. Apical beat not displaced Peripheral pulses normal.  Abdomen: Soft, non tender, no organomegaly or masses. No bruits. Bowel sounds normal. No guarding, tenderness or rebound.  Rectal:  Normal sphincter tone. No rectal mass. Guaiac negative stool.  GU: External genitalia normal female genitalia , normal female distribution of hair. No lesions. Urethral meatus normal in size, no  Prolapse, no lesions visibly  Present. Bladder non tender. Vagina pink and moist , with no visible lesions , discharge present . Adequate pelvic support no  cystocele or rectocele noted  Uterus absent, no adnexal masses, no  adnexal tenderness.   Musculoskeletal exam: Decreased though adequate  ROM of spine, hips , shoulders and reduced in left knee, which has swelling andddeformity.  No muscle wasting or atrophy.   Neurologic: Cranial nerves 2 to 12 intact. Power, tone ,sensation and reflexes normal throughout. No  disturbance in gait. No tremor.  Skin: Intact, no ulceration, erythema , scaling or rash noted. Pigmentation normal throughout  Psych; Normal mood and affect. Judgement and concentration normal   Assessment & Plan:  Annual physical exam Annual exam as documented. Counseling done  re healthy lifestyle involving commitment to 150 minutes exercise per week, heart healthy diet, and attaining healthy weight.The importance of adequate sleep also discussed. Regular seat belt use and home safety, is also discussed. Changes in health habits are decided on by the patient with goals and time frames  set for achieving them. Immunization and cancer screening needs are specifically addressed at this visit.

## 2016-07-05 ENCOUNTER — Ambulatory Visit (INDEPENDENT_AMBULATORY_CARE_PROVIDER_SITE_OTHER): Payer: Medicare Other | Admitting: Gastroenterology

## 2016-07-05 ENCOUNTER — Encounter: Payer: Self-pay | Admitting: Gastroenterology

## 2016-07-05 DIAGNOSIS — E6609 Other obesity due to excess calories: Secondary | ICD-10-CM

## 2016-07-05 DIAGNOSIS — K5901 Slow transit constipation: Secondary | ICD-10-CM

## 2016-07-05 DIAGNOSIS — K253 Acute gastric ulcer without hemorrhage or perforation: Secondary | ICD-10-CM | POA: Diagnosis not present

## 2016-07-05 DIAGNOSIS — K219 Gastro-esophageal reflux disease without esophagitis: Secondary | ICD-10-CM

## 2016-07-05 DIAGNOSIS — R1319 Other dysphagia: Secondary | ICD-10-CM | POA: Diagnosis not present

## 2016-07-05 DIAGNOSIS — Z6834 Body mass index (BMI) 34.0-34.9, adult: Secondary | ICD-10-CM

## 2016-07-05 LAB — HEMOGLOBIN A1C
HEMOGLOBIN A1C: 6 % — AB (ref <5.7)
MEAN PLASMA GLUCOSE: 126 mg/dL

## 2016-07-05 NOTE — Progress Notes (Signed)
cc'ed to pcp °

## 2016-07-05 NOTE — Assessment & Plan Note (Addendum)
LAST EGD 2015-NO PUD. NO WARNING SIGNS/SYMPTOMS  REVIEWED EGD WITH PATIENT.

## 2016-07-05 NOTE — Patient Instructions (Addendum)
TAKE AMITIZA AT BEDTIME AND COFFEE IN THE MORNING TO KEEP BOWELS MOVING.  DRINK WATER TO KEEP YOUR URINE LIGHT YELLOW.  CONTINUE YOUR WEIGHT LOSS EFFORTS.  FOLLOW A MILITARY FOR 3 DAYS THEN LOW FAT DIET. SEE HANDOUT AND INFO BELOW ON LOW FAT DIET.  CONTINUE OMEPRAZOLE.  TAKE 30 MINUTES PRIOR TO YOUR FIRST MEAL.  FOLLOW UP IN 6 MOS.   Low-Fat Diet BREADS, CEREALS, PASTA, RICE, DRIED PEAS, AND BEANS These products are high in carbohydrates and most are low in fat. Therefore, they can be increased in the diet as substitutes for fatty foods. They too, however, contain calories and should not be eaten in excess. Cereals can be eaten for snacks as well as for breakfast.   FRUITS AND VEGETABLES It is good to eat fruits and vegetables. Besides being sources of fiber, both are rich in vitamins and some minerals. They help you get the daily allowances of these nutrients. Fruits and vegetables can be used for snacks and desserts.  MEATS Limit lean meat, chicken, [place], and fish to no more than 6 ounces per day. Beef, Pork, and Lamb Use lean cuts of beef, pork, and lamb. Lean cuts include:  Extra-lean ground beef.  Arm roast.  Sirloin tip.  Center-cut ham.  Round steak.  Loin chops.  Rump roast.  Tenderloin.  Trim all fat off the outside of meats before cooking. It is not necessary to severely decrease the intake of red meat, but lean choices should be made. Lean meat is rich in protein and contains a highly absorbable form of iron. Premenopausal women, in particular, should avoid reducing lean red meat because this could increase the risk for low red blood cells (iron-deficiency anemia).  Chicken and [place] These are good sources of protein. The fat of poultry can be reduced by removing the skin and underlying fat layers before cooking. Chicken and [place] can be substituted for lean red meat in the diet. Poultry should not be fried or covered with high-fat sauces. Fish and Shellfish Fish is  a good source of protein. Shellfish contain cholesterol, but they usually are low in saturated fatty acids. The preparation of fish is important. Like chicken and [place], they should not be fried or covered with high-fat sauces. EGGS Egg whites contain no fat or cholesterol. They can be eaten often. Try 1 to 2 egg whites instead of whole eggs in recipes or use egg substitutes that do not contain yolk. MILK AND DAIRY PRODUCTS Use skim or 1% milk instead of 2% or whole milk. Decrease whole milk, natural, and processed cheeses. Use nonfat or low-fat (2%) cottage cheese or low-fat cheeses made from vegetable oils. Choose nonfat or low-fat (1 to 2%) yogurt. Experiment with evaporated skim milk in recipes that call for heavy cream. Substitute low-fat yogurt or low-fat cottage cheese for sour cream in dips and salad dressings. Have at least 2 servings of low-fat dairy products, such as 2 glasses of skim (or 1%) milk each day to help get your daily calcium intake. FATS AND OILS Reduce the total intake of fats, especially saturated fat. Butterfat, lard, and beef fats are high in saturated fat and cholesterol. These should be avoided as much as possible. Vegetable fats do not contain cholesterol, but certain vegetable fats, such as coconut oil, palm oil, and palm kernel oil are very high in saturated fats. These should be limited. These fats are often used in bakery goods, processed foods, popcorn, oils, and nondairy creamers. Vegetable shortenings and some  peanut butters contain hydrogenated oils, which are also saturated fats. Read the labels on these foods and check for saturated vegetable oils. Unsaturated vegetable oils and fats do not raise blood cholesterol. However, they should be limited because they are fats and are high in calories. Total fat should still be limited to 30% of your daily caloric intake. Desirable liquid vegetable oils are corn oil, cottonseed oil, olive oil, canola oil, safflower oil, soybean  oil, and sunflower oil. Peanut oil is not as good, but small amounts are acceptable. Buy a heart-healthy tub margarine that has no partially hydrogenated oils in the ingredients. Mayonnaise and salad dressings often are made from unsaturated fats, but they should also be limited because of their high calorie and fat content. Seeds, nuts, peanut butter, olives, and avocados are high in fat, but the fat is mainly the unsaturated type. These foods should be limited mainly to avoid excess calories and fat. OTHER EATING TIPS Snacks  Most sweets should be limited as snacks. They tend to be rich in calories and fats, and their caloric content outweighs their nutritional value. Some good choices in snacks are graham crackers, melba toast, soda crackers, bagels (no egg), English muffins, fruits, and vegetables. These snacks are preferable to snack crackers, [place] fries, TORTILLA CHIPS, and POTATO chips. Popcorn should be air-popped or cooked in small amounts of liquid vegetable oil. Desserts Eat fruit, low-fat yogurt, and fruit ices instead of pastries, cake, and cookies. Sherbet, angel food cake, gelatin dessert, frozen low-fat yogurt, or other frozen products that do not contain saturated fat (pure fruit juice bars, frozen ice pops) are also acceptable.  COOKING METHODS Choose those methods that use little or no fat. They include: Poaching.  Braising.  Steaming.  Grilling.  Baking.  Stir-frying.  Broiling.  Microwaving.  Foods can be cooked in a nonstick pan without added fat, or use a nonfat cooking spray in regular cookware. Limit fried foods and avoid frying in saturated fat. Add moisture to lean meats by using water, broth, cooking wines, and other nonfat or low-fat sauces along with the cooking methods mentioned above. Soups and stews should be chilled after cooking. The fat that forms on top after a few hours in the refrigerator should be skimmed off. When preparing meals, avoid using excess salt.  Salt can contribute to raising blood pressure in some people.  EATING AWAY FROM HOME Order entres, potatoes, and vegetables without sauces or butter. When meat exceeds the size of a deck of cards (3 to 4 ounces), the rest can be taken home for another meal. Choose vegetable or fruit salads and ask for low-calorie salad dressings to be served on the side. Use dressings sparingly. Limit high-fat toppings, such as bacon, crumbled eggs, cheese, sunflower seeds, and olives. Ask for heart-healthy tub margarine instead of butter.

## 2016-07-05 NOTE — Assessment & Plan Note (Signed)
MOTIVATED TO LOSE WEIGHT-BMI 34  LABS REVIEWED. TAKE AMITIZA AT BEDTIME AND COFFEE IN THE MORNING TO KEEP BOWELS MOVING. DRINK WATER TO KEEP YOUR URINE LIGHT YELLOW. CONTINUE YOUR WEIGHT LOSS EFFORTS. FOLLOW A MILITARY FOR 3 DAYS THEN LOW FAT DIET. SEE HANDOUT AND INFO BELOW ON LOW FAT DIET.  FOLLOW UP IN 6 MOS.

## 2016-07-05 NOTE — Assessment & Plan Note (Signed)
NO IDEALLY CONTROLLED BUT IMPROVED. [place].  TAKE AMITIZA AT BEDTIME AND COFFEE IN THE MORNING TO KEEP BOWELS MOVING.  DRINK WATER TO KEEP YOUR URINE LIGHT YELLOW. FOLLOW UP IN 6 MOS.

## 2016-07-05 NOTE — Assessment & Plan Note (Signed)
SYMPTOMS CONTROLLED/RESOLVED.  CONTINUE TO MONITOR SYMPTOMS. 

## 2016-07-05 NOTE — Assessment & Plan Note (Signed)
SYMPTOMS FAIRLY WELL CONTROLLED.  DRINK WATER TO KEEP YOUR URINE LIGHT YELLOW. CONTINUE YOUR WEIGHT LOSS EFFORTS. FOLLOW A MILITARY FOR 3 DAYS THEN LOW FAT DIET. SEE HANDOUT AND INFO BELOW ON LOW FAT DIET. CONTINUE OMEPRAZOLE.  TAKE 30 MINUTES PRIOR TO YOUR FIRST MEAL.  FOLLOW UP IN 6 MOS.

## 2016-07-05 NOTE — Progress Notes (Signed)
Subjective:    Patient ID: [name], female    DOB: February 04, 1941, 75 y.o.   MRN: [id number]  [name], MD  HPI [facility] twice daily made her nauseated now using prn. BMs: DAILY WITHOUT IT BuT HAS COFFEE.  Feels fullness in belly after she eats. Feels like food stops in her mid-abdomen.  PT DENIES FEVER, CHILLS, HEMATOCHEZIA, HEMATEMESIS, vomiting, melena, diarrhea, CHEST PAIN, SHORTNESS OF BREATH,  CHANGE IN BOWEL IN HABITS, constipation, abdominal pain, problems swallowing, problems with sedation, or heartburn or indigestion.  Past Medical History:  Diagnosis Date  . COPD (chronic obstructive pulmonary disease) ([place])   . Hypertension 2004  . Lung nodule seen on imaging study   . Nicotine addiction   . OA (osteoarthritis) of knee   . Obesity   . Sinusitis    Past Surgical History:  Procedure Laterality Date  . bilateral cataract surgery  7/27 & 03/01/09   Dr. Gershon Crane    . cholecystectomy    . COLONOSCOPY  04/2004   Dr. Leane Para Smith-->melanosis coli  . COLONOSCOPY WITH ESOPHAGOGASTRODUODENOSCOPY (EGD) N/A 02/25/2013   Dr. Vangie Henthorn:Normal mucosa in the terminal ileum/Severe melanosis throughout the entire examined colon/ONE COLON POLYP REMOVED (tubular adenoma)/Mild diverticulosis  in the sigmoid colon/EGD:The mucosa of the esophagus appeared normal/Non-erosive gastritis, empiric dilation with Savary dilator  . ESOPHAGOGASTRODUODENOSCOPY  11/07/2010   Jenkins:hiatal hernia/no evidence of Barrett esophagitis.  The Z-line was noted to be at 39 cm from the teeth. CLO test negative  . 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 bx=focal erosion with inflammation and fibrosis  . ESOPHAGOGASTRODUODENOSCOPY N/A 07/06/2014   Procedure: ESOPHAGOGASTRODUODENOSCOPY (EGD);  Surgeon: Danie Binder, MD;  Location: [facility];  Service: Endoscopy;  Laterality: N/A;  . knee arthroscopy right    .  LEFT HEART CATHETERIZATION WITH CORONARY ANGIOGRAM N/A 04/08/2014   Procedure: LEFT HEART CATHETERIZATION WITH CORONARY ANGIOGRAM;  Surgeon: Sinclair Grooms, MD;  Location: [facility] CATH LAB;  Service: Cardiovascular;  Laterality: N/A;  . MALONEY DILATION N/A 02/25/2013   Procedure: Venia Minks DILATION;  Surgeon: Danie Binder, MD;  Location: [facility];  Service: Endoscopy;  Laterality: N/A;  . PARTIAL HYSTERECTOMY    . SAVORY DILATION N/A 02/25/2013   Procedure: SAVORY DILATION;  Surgeon: Danie Binder, MD;  Location: [facility];  Service: Endoscopy;  Laterality: N/A;  . total knee arthroplasty right  05/29/2005   Dr. Aline Brochure    No Known Allergies  Current Outpatient Prescriptions  Medication Sig Dispense Refill  . albuterol MCG/ACT inhaler Inhale 2-4 puffs into the lungs every 4 (four) hours prnSOB    . ALOE VERA PO Take 500 mg by mouth daily.    Marland Kitchen aspirin EC 81 MG tablet Take 81 mg by mouth daily.    Marland Kitchen buPROPion 100 MG 12 hr tablet Take 1 tablet (100 mg total) by mouth 2 (two) times daily.    Marland Kitchen CALCIUM + D PO Take 1 tablet by mouth daily.    . Cholecalciferol 400 units CAPS Take 1 capsule by mouth daily.    . fluticasone 50 MCG/ACT nasal spray Place 2 sprays into both nostrils daily as needed for allergies or rhinitis.    . hydrochlorothiazide 25 MG tablet TAKE 1 TABLET(25 MG) BY MOUTH DAILY    . lubiprostone 24 MCG capsule Take 1 capsule (24 mcg total) BID with a meal. PRN   . montelukast (SINGULAIR) 10 MG tablet  Take 1 tablet (10 mg total) by mouth at bedtime.    Marland Kitchen omeprazole 20 MG capsule TAKE 1 30 MINS PRIOR TO BREAKFAST FOREVER    . pravastatin 40 MG tablet TAKE 1 TABLET(40 MG) BY MOUTH AT BEDTIME    . Red Yeast Rice 600 MG CAPS Take 1 capsule by mouth daily.    . sodium chloride 0.65 % SOLN spray Place 1 spray into both nostrilsPRN for congestion.    . vitamin C  500 MG tablet Take 500 mg by mouth daily.     Review of Systems PER HPI OTHERWISE ALL SYSTEMS ARE NEGATIVE.      Objective:   Physical Exam  Constitutional: She is oriented to person, place, and time. She appears well-developed and well-nourished. No distress.  HENT:  Head: Normocephalic and atraumatic.  Mouth/Throat: Oropharynx is clear and moist. No oropharyngeal exudate.  Eyes: Pupils are equal, round, and reactive to light. No scleral icterus.  Neck: Normal range of motion. Neck supple.  Cardiovascular: Normal rate, regular rhythm and normal heart sounds.   Pulmonary/Chest: Effort normal and breath sounds normal. No respiratory distress.  Abdominal: Soft. Bowel sounds are normal. She exhibits no distension. There is no tenderness.  Musculoskeletal: She exhibits no edema.  Lymphadenopathy:    She has no cervical adenopathy.  Neurological: She is alert and oriented to person, place, and time.  Psychiatric: She has a normal mood and affect.  Vitals reviewed.     Assessment & Plan:

## 2016-07-05 NOTE — Progress Notes (Signed)
ON RECALL  °

## 2016-07-14 NOTE — Progress Notes (Signed)
REVIEWED-NO ADDITIONAL RECOMMENDATIONS. 

## 2016-08-09 ENCOUNTER — Telehealth: Payer: Self-pay | Admitting: Family Medicine

## 2016-08-09 NOTE — Telephone Encounter (Signed)
Spoke with patient about AWV and she will talk with MD about doing at a later date

## 2016-08-27 ENCOUNTER — Other Ambulatory Visit: Payer: Self-pay | Admitting: Gastroenterology

## 2016-10-10 ENCOUNTER — Ambulatory Visit (INDEPENDENT_AMBULATORY_CARE_PROVIDER_SITE_OTHER): Payer: Medicare Other | Admitting: Family Medicine

## 2016-10-10 ENCOUNTER — Encounter: Payer: Self-pay | Admitting: Family Medicine

## 2016-10-10 VITALS — BP 124/80 | HR 104 | Temp 97.8°F | Resp 15 | Ht 65.0 in | Wt 210.0 lb

## 2016-10-10 DIAGNOSIS — R7303 Prediabetes: Secondary | ICD-10-CM | POA: Diagnosis not present

## 2016-10-10 DIAGNOSIS — J0100 Acute maxillary sinusitis, unspecified: Secondary | ICD-10-CM

## 2016-10-10 DIAGNOSIS — I10 Essential (primary) hypertension: Secondary | ICD-10-CM | POA: Diagnosis not present

## 2016-10-10 DIAGNOSIS — E785 Hyperlipidemia, unspecified: Secondary | ICD-10-CM

## 2016-10-10 MED ORDER — DOXYCYCLINE HYCLATE 100 MG PO TABS: 100.0000 mg | ORAL_TABLET | Freq: Two times a day (BID) | ORAL | 0 refills | Status: DC

## 2016-10-10 NOTE — Progress Notes (Signed)
   [name]     MRN: [id number]      DOB: [birth year]   HPI [name] is here for follow up and re-evaluation of chronic medical conditions, medication management and review of any available recent lab and radiology data.  Preventive health is updated, specifically  Cancer screening and Immunization.   Questions or concerns regarding consultations or procedures which the PT has had in the interim are  addressed. The PT denies any adverse reactions to current medications since the last visit.  3 week h/o foul smelling and tasting thick yellow sinus drainage and pan sinus pressure Two week h/o foul smelling urine Low back pain and left knee pain intermittently  ROS Denies recent fever or chills. Denies  ear pain or sore throat. Denies chest congestion, productive cough or wheezing. Denies chest pains, palpitations and leg swelling Denies abdominal pain, nausea, vomiting,diarrhea or constipation.    Denies joint pain, swelling and limitation in mobility. Denies headaches, seizures, numbness, or tingling. Denies depression, anxiety or insomnia. Denies skin break down or rash.   PE  BP 124/80   Pulse (!) 104   Temp 97.8 F (36.6 C) (Oral)   Resp 15   Ht 5\' 5"  (1.651 m)   Wt 210 lb (95.3 kg)   SpO2 95%   BMI 34.95 kg/m   Patient alert and oriented and in no cardiopulmonary distress.  HEENT: No facial asymmetry, EOMI,   oropharynx pink and moist.  Neck supple no JVD, no mass.Maxillary sinus tenderness, TM clear bilaterally  Chest: Clear to auscultation bilaterally.  CVS: S1, S2 no murmurs, no S3.Regular rate.  ABD: Soft , no renal angle tenderness, suprapubic tenderness  Ext: No edema  MS: Adequate decreased ROM lumbar spine, normal in shoulders, hips and reduced ROM left knee.  Skin: Intact, no ulcerations or rash noted.  Psych: Good eye contact, normal affect. Memory intact not anxious or depressed appearing.  CNS: CN 2-12 intact, power,  normal throughout.no  focal deficits noted.   Assessment & Plan  Acute sinusitis Antibiotic prescribed for sinus infection, and also advise to flush with saline  Acute cystitis Symptomatic and unable to void, will treat presumptively  BACK PAIN WITH RADICULOPATHY Increased back pain, no recent trauma, advised to use tylenol and topical massages  Essential hypertension Controlled, no change in medication DASH diet and commitment to daily physical activity for a minimum of 30 minutes discussed and encouraged, as a part of hypertension management. The importance of attaining a healthy weight is also discussed.  BP/Weight 10/10/2016 07/05/2016 07/04/2016 04/20/2016 03/15/2016 02/20/2016 03/02/8866  Systolic BP 737 366 815 947 076 151 834  Diastolic BP 80 83 80 373 74 80 82  Wt. (Lbs) 210 208.8 207.8 204 200.2 208 214  BMI 34.95 34.75 33.54 32.93 32.31 33.57 34.56

## 2016-10-10 NOTE — Patient Instructions (Addendum)
f/u in May as before  You are also being given appt for wellness visit with Nurse Tonna Corner are treated for sinus infection and possible urinary infection , antibiotic course prescribed  Use tylenol for low back and left knee pain  Please commit to stopping smoking, you do NOT NEED the 3 cigarettes per day that you smoke, you can stop!   Steps to Quit Smoking Smoking tobacco can be bad for your health. It can also affect almost every organ in your body. Smoking puts you and people around you at risk for many serious long-lasting (chronic) diseases. Quitting smoking is hard, but it is one of the best things that you can do for your health. It is never too late to quit. What are the benefits of quitting smoking? When you quit smoking, you lower your risk for getting serious diseases and conditions. They can include:  Lung cancer or lung disease.  Heart disease.  Stroke.  Heart attack.  Not being able to have children (infertility).  Weak bones (osteoporosis) and broken bones (fractures). If you have coughing, wheezing, and shortness of breath, those symptoms may get better when you quit. You may also get sick less often. If you are pregnant, quitting smoking can help to lower your chances of having a baby of low birth weight. What can I do to help me quit smoking? Talk with your doctor about what can help you quit smoking. Some things you can do (strategies) include:  Quitting smoking totally, instead of slowly cutting back how much you smoke over a period of time.  Going to in-person counseling. You are more likely to quit if you go to many counseling sessions.  Using resources and support systems, such as:  Online chats with a Social worker.  Phone quitlines.  Printed Furniture conservator/restorer.  Support groups or group counseling.  Text messaging programs.  Mobile phone apps or applications.  Taking medicines. Some of these medicines may have nicotine in them. If you are  pregnant or breastfeeding, do not take any medicines to quit smoking unless your doctor says it is okay. Talk with your doctor about counseling or other things that can help you. Talk with your doctor about using more than one strategy at the same time, such as taking medicines while you are also going to in-person counseling. This can help make quitting easier. What things can I do to make it easier to quit? Quitting smoking might feel very hard at first, but there is a lot that you can do to make it easier. Take these steps:  Talk to your family and friends. Ask them to support and encourage you.  Call phone quitlines, reach out to support groups, or work with a Social worker.  Ask people who smoke to not smoke around you.  Avoid places that make you want (trigger) to smoke, such as:  Bars.  Parties.  Smoke-break areas at work.  Spend time with people who do not smoke.  Lower the stress in your life. Stress can make you want to smoke. Try these things to help your stress:  Getting regular exercise.  Deep-breathing exercises.  Yoga.  Meditating.  Doing a body scan. To do this, close your eyes, focus on one area of your body at a time from head to toe, and notice which parts of your body are tense. Try to relax the muscles in those areas.  Download or buy apps on your mobile phone or tablet that can help you stick to  your quit plan. There are many free apps, such as QuitGuide from the [employer] Office manager for Disease Control and Prevention). You can find more support from smokefree.gov and other websites. This information is not intended to replace advice given to you by your health care provider. Make sure you discuss any questions you have with your health care provider. Document Released: 05/05/2009 Document Revised: 03/06/2016 Document Reviewed: 11/23/2014 Elsevier Interactive Patient Education  2017 [place] you  for choosing [facility]. We consider it a  privelige to serve you.  Delivering excellent health care in a caring and  compassionate way is our goal.  Partnering with you,  so that together we can achieve this goal is our strategy.   Please work on good  health habits so that your health will improve. 1. Commitment to daily physical activity for 30 to 60  minutes, if you are able to do this.  2. Commitment to wise food choices. Aim for half of your  food intake to be vegetable and fruit, one quarter starchy foods, and one quarter protein. Try to eat on a regular schedule  3 meals per day, snacking between meals should be limited to vegetables or fruits or small portions of nuts. 64 ounces of water per day is generally recommended, unless you have specific health conditions, like heart failure or kidney failure where you will need to limit fluid intake.  3. Commitment to sufficient and a  good quality of physical and mental rest daily, generally between 6 to 8 hours per day.  WITH PERSISTANCE AND PERSEVERANCE, THE IMPOSSIBLE , BECOMES THE NORM!

## 2016-10-12 DIAGNOSIS — J019 Acute sinusitis, unspecified: Secondary | ICD-10-CM | POA: Insufficient documentation

## 2016-10-12 NOTE — Assessment & Plan Note (Signed)
Symptomatic and unable to void, will treat presumptively

## 2016-10-12 NOTE — Assessment & Plan Note (Signed)
Antibiotic prescribed for sinus infection, and also advise to flush with saline

## 2016-10-12 NOTE — Assessment & Plan Note (Signed)
Controlled, no change in medication DASH diet and commitment to daily physical activity for a minimum of 30 minutes discussed and encouraged, as a part of hypertension management. The importance of attaining a healthy weight is also discussed.  BP/Weight 10/10/2016 07/05/2016 07/04/2016 04/20/2016 03/15/2016 02/20/2016 7/90/3833  Systolic BP 383 291 916 606 004 599 774  Diastolic BP 80 83 80 142 74 80 82  Wt. (Lbs) 210 208.8 207.8 204 200.2 208 214  BMI 34.95 34.75 33.54 32.93 32.31 33.57 34.56

## 2016-10-12 NOTE — Assessment & Plan Note (Signed)
Increased back pain, no recent trauma, advised to use tylenol and topical massages

## 2016-11-20 ENCOUNTER — Encounter: Payer: Self-pay | Admitting: Gastroenterology

## 2016-12-03 ENCOUNTER — Encounter: Payer: Self-pay | Admitting: Family Medicine

## 2016-12-03 ENCOUNTER — Ambulatory Visit (INDEPENDENT_AMBULATORY_CARE_PROVIDER_SITE_OTHER): Payer: Medicare Other

## 2016-12-03 ENCOUNTER — Ambulatory Visit (INDEPENDENT_AMBULATORY_CARE_PROVIDER_SITE_OTHER): Payer: Medicare Other | Admitting: Family Medicine

## 2016-12-03 VITALS — BP 122/80 | HR 97 | Temp 97.8°F | Ht 65.0 in | Wt 210.0 lb

## 2016-12-03 VITALS — BP 122/80 | HR 97 | Resp 16 | Ht 65.0 in | Wt 210.0 lb

## 2016-12-03 DIAGNOSIS — Z0000 Encounter for general adult medical examination without abnormal findings: Secondary | ICD-10-CM

## 2016-12-03 DIAGNOSIS — E785 Hyperlipidemia, unspecified: Secondary | ICD-10-CM

## 2016-12-03 DIAGNOSIS — R7303 Prediabetes: Secondary | ICD-10-CM | POA: Diagnosis not present

## 2016-12-03 DIAGNOSIS — I10 Essential (primary) hypertension: Secondary | ICD-10-CM

## 2016-12-03 DIAGNOSIS — E8881 Metabolic syndrome: Secondary | ICD-10-CM

## 2016-12-03 DIAGNOSIS — J3089 Other allergic rhinitis: Secondary | ICD-10-CM

## 2016-12-03 DIAGNOSIS — F17200 Nicotine dependence, unspecified, uncomplicated: Secondary | ICD-10-CM

## 2016-12-03 NOTE — Progress Notes (Signed)
[name]     MRN: [id number]      DOB: 08/13/1940   HPI Ms. Varble is here for follow up and re-evaluation of chronic medical conditions, medication management and review of any available recent lab and radiology data.  Preventive health is updated, specifically  Cancer screening and Immunization.   Has upcoming GI appt for GERD The PT denies any adverse reactions to current medications since the last visit.  C/o increased stress and poor relationships with one of her children who lives locally, but she has learned to let go and turn it over to the [place] Denies recent fever or chills. C/o increased  sinus pressure and  nasal congestion, ear pain or sore throat. Denies chest congestion, productive cough or wheezing. Denies chest pains, palpitations and leg swelling Denies abdominal pain, nausea, vomiting,diarrhea or constipation.   Denies dysuria, frequency, hesitancy or incontinence. Denies uncontrolled  joint pain, swelling and limitation in mobility. Denies headaches, seizures, numbness, or tingling. Denies skin break down or rash.   PE  BP 122/80   Pulse 97   Resp 16   Ht 5\' 5"  (1.651 m)   Wt 210 lb (95.3 kg)   SpO2 95%   BMI 34.95 kg/m   Patient alert and oriented and in no cardiopulmonary distress.  HEENT: No facial asymmetry, EOMI,   oropharynx pink and moist.  Neck supple no JVD, no mass.tM clear  Chest: Clear to auscultation bilaterally.  CVS: S1, S2 no murmurs, no S3.Regular rate.  ABD: Soft non tender.   Ext: No edema  MS: Adequate though reduced  ROM spine, shoulders, hips and knees.  Skin: Intact, no ulcerations or rash noted.  Psych: Good eye contact, normal affect. Memory intact not anxious or depressed appearing.  CNS: CN 2-12 intact, power,  normal throughout.no focal deficits noted.   Assessment & Plan  Essential hypertension Controlled, no change in medication DASH diet and commitment to daily physical activity for a  minimum of 30 minutes discussed and encouraged, as a part of hypertension management. The importance of attaining a healthy weight is also discussed.  BP/Weight 12/03/2016 12/03/2016 10/10/2016 07/05/2016 07/04/2016 04/20/2016 7/65/4650  Systolic BP 354 656 812 751 700 174 944  Diastolic BP 80 80 80 83 80 100 74  Wt. (Lbs) 210 210 210 208.8 207.8 204 200.2  BMI 34.95 34.95 34.95 34.75 33.54 32.93 32.31       NICOTINE ADDICTION Patient counseled for approximately 5 minutes regarding the health risks of ongoing nicotine use, specifically all types of cancer, heart disease, stroke and respiratory failure. The options available for help with cessation ,the behavioral changes to assist the process, and the option to either gradully reduce usage  Or abruptly stop.is also discussed. Pt is also encouraged to set specific goals in number of cigarettes used daily, as well as to set a quit date.     Obesity Unchanged Patient re-educated about  the importance of commitment to a  minimum of 150 minutes of exercise per week.  The importance of healthy food choices with portion control discussed. Encouraged to start a food diary, count calories and to consider  joining a support group. Sample diet sheets offered. Goals set by the patient for the next several months.   Weight /BMI 12/03/2016 12/03/2016 10/10/2016  WEIGHT 210 lb 210 lb 210 lb  HEIGHT 5\' 5"  5\' 5"  5\' 5"   BMI 34.95 kg/m2 34.95 kg/m2 34.95 kg/m2      Allergic  rhinitis Increased and uncontrolled symptoms pt re educated re need to use both claritin and flonase daily  Prediabetes Patient educated about the importance of limiting  Carbohydrate intake , the need to commit to daily physical activity for a minimum of 30 minutes , and to commit weight loss. The fact that changes in all these areas will reduce or eliminate all together the development of diabetes is stressed.  Updated lab needed at/ before next visit.   Diabetic Labs Latest  Ref Rng & Units 07/04/2016 02/20/2016 10/06/2015 07/14/2015 12/27/2014  HbA1c <5.7 % 6.0(H) 6.4(H) 6.3(H) - 6.2(H)  Microalbumin 0.00 - 1.89 mg/dL - - - - -  Micro/Creat Ratio 0.0 - 30.0 mg/g - - - - -  Chol <200 mg/dL 186 - 173 - 174  HDL >50 mg/dL 45(L) - 45(L) - 44(L)  Calc LDL <100 mg/dL 118(H) - 100 - 112(H)  Triglycerides <150 mg/dL 117 - 138 - 89  Creatinine 0.60 - 0.93 mg/dL 0.88 0.86 0.84 0.89 0.84   BP/Weight 12/03/2016 12/03/2016 10/10/2016 07/05/2016 07/04/2016 04/20/2016 1/54/0086  Systolic BP 761 950 932 671 245 809 983  Diastolic BP 80 80 80 83 80 100 74  Wt. (Lbs) 210 210 210 208.8 207.8 204 200.2  BMI 34.95 34.95 34.95 34.75 33.54 32.93 32.31   Foot/eye exam completion dates 05/02/2010  Foot exam Order yes  Foot Form Completion -      Hyperlipidemia LDL goal <100 Hyperlipidemia:Low fat diet discussed and encouraged.   Lipid Panel  Lab Results  Component Value Date   CHOL 186 07/04/2016   HDL 45 (L) 07/04/2016   LDLCALC 118 (H) 07/04/2016   TRIG 117 07/04/2016   CHOLHDL 4.1 07/04/2016   Updated lab needed at/ before next visit.

## 2016-12-03 NOTE — Assessment & Plan Note (Signed)

## 2016-12-03 NOTE — Assessment & Plan Note (Signed)
Hyperlipidemia:Low fat diet discussed and encouraged.   Lipid Panel  Lab Results  Component Value Date   CHOL 186 07/04/2016   HDL 45 (L) 07/04/2016   LDLCALC 118 (H) 07/04/2016   TRIG 117 07/04/2016   CHOLHDL 4.1 07/04/2016   Updated lab needed at/ before next visit.

## 2016-12-03 NOTE — Assessment & Plan Note (Signed)
Unchanged Patient re-educated about  the importance of commitment to a  minimum of 150 minutes of exercise per week.  The importance of healthy food choices with portion control discussed. Encouraged to start a food diary, count calories and to consider  joining a support group. Sample diet sheets offered. Goals set by the patient for the next several months.   Weight /BMI 12/03/2016 12/03/2016 10/10/2016  WEIGHT 210 lb 210 lb 210 lb  HEIGHT 5\' 5"  5\' 5"  5\' 5"   BMI 34.95 kg/m2 34.95 kg/m2 34.95 kg/m2

## 2016-12-03 NOTE — Assessment & Plan Note (Signed)
Increased and uncontrolled symptoms pt re educated re need to use both claritin and flonase daily

## 2016-12-03 NOTE — Assessment & Plan Note (Signed)
Controlled, no change in medication DASH diet and commitment to daily physical activity for a minimum of 30 minutes discussed and encouraged, as a part of hypertension management. The importance of attaining a healthy weight is also discussed.  BP/Weight 12/03/2016 12/03/2016 10/10/2016 07/05/2016 07/04/2016 04/20/2016 08/19/7865  Systolic BP 672 094 709 628 366 294 765  Diastolic BP 80 80 80 83 80 100 74  Wt. (Lbs) 210 210 210 208.8 207.8 204 200.2  BMI 34.95 34.95 34.95 34.75 33.54 32.93 32.31

## 2016-12-03 NOTE — Progress Notes (Signed)
Subjective:   [name] is a 76 y.o. female who presents for Medicare Annual (Subsequent) preventive examination.  Review of Systems:  Cardiac Risk Factors include: advanced age (>64men, >26 women);dyslipidemia;hypertension;obesity (BMI >30kg/m2);sedentary lifestyle;smoking/ tobacco exposure     Objective:     Vitals: BP 122/80   Pulse 97   Temp 97.8 F (36.6 C) (Oral)   Ht 5\' 5"  (1.651 m)   Wt 210 lb (95.3 kg)   SpO2 95%   BMI 34.95 kg/m   Body mass index is 34.95 kg/m.   Tobacco History  Smoking Status  . Current Some Day Smoker  . Packs/day: 0.30  . Years: 10.00  . Types: Cigarettes  . Start date: 07/23/1962  Smokeless Tobacco  . Never Used    Comment: 3 per day      Ready to quit: Yes Counseling given: Yes   Past Medical History:  Diagnosis Date  . COPD (chronic obstructive pulmonary disease) ([place])   . Hiatal hernia without gangrene and obstruction 03/23/2015  . Hypertension 2004  . Lung nodule seen on imaging study   . Nicotine addiction   . OA (osteoarthritis) of knee   . Obesity   . Sinusitis    Past Surgical History:  Procedure Laterality Date  . bilateral cataract surgery  7/27 & 03/01/09   Dr. Gershon Crane    . cholecystectomy    . COLONOSCOPY  04/2004   Dr. Leane Para Smith-->melanosis coli  . COLONOSCOPY WITH ESOPHAGOGASTRODUODENOSCOPY (EGD) N/A 02/25/2013   Dr. Fields:Normal mucosa in the terminal ileum/Severe melanosis throughout the entire examined colon/ONE COLON POLYP REMOVED (tubular adenoma)/Mild diverticulosis  in the sigmoid colon/EGD:The mucosa of the esophagus appeared normal/Non-erosive gastritis, empiric dilation with Savary dilator  . ESOPHAGOGASTRODUODENOSCOPY  11/07/2010   Jenkins:hiatal hernia/no evidence of Barrett esophagitis.  The Z-line was noted to be at 39 cm from the teeth. CLO test negative  . ESOPHAGOGASTRODUODENOSCOPY N/A 09/11/2013   Dr.Fields-  incomplete esophageal web in mid-esophagus, medium sized hialtal hernia, PUD.  bx=focal erosion with inflammation and fibrosis  . ESOPHAGOGASTRODUODENOSCOPY N/A 07/06/2014   Procedure: ESOPHAGOGASTRODUODENOSCOPY (EGD);  Surgeon: Danie Binder, MD;  Location: [facility];  Service: Endoscopy;  Laterality: N/A;  . knee arthroscopy right    . LEFT HEART CATHETERIZATION WITH CORONARY ANGIOGRAM N/A 04/08/2014   Procedure: LEFT HEART CATHETERIZATION WITH CORONARY ANGIOGRAM;  Surgeon: Sinclair Grooms, MD;  Location: [facility] CATH LAB;  Service: Cardiovascular;  Laterality: N/A;  . MALONEY DILATION N/A 02/25/2013   Procedure: Venia Minks DILATION;  Surgeon: Danie Binder, MD;  Location: [facility];  Service: Endoscopy;  Laterality: N/A;  . PARTIAL HYSTERECTOMY    . SAVORY DILATION N/A 02/25/2013   Procedure: SAVORY DILATION;  Surgeon: Danie Binder, MD;  Location: [facility];  Service: Endoscopy;  Laterality: N/A;  . total knee arthroplasty right  05/29/2005   Dr. Aline Brochure    Family History  Problem Relation Age of Onset  . Diabetes Mother   . Hypertension Mother   . Coronary artery disease Mother   . Pneumonia Father   . Hypertension Sister   . Hypertension Sister   . Hypertension Brother        Psychologist, forensic  . Diabetes Brother   . Hypertension Brother   . Colon cancer Neg Hx   . Liver disease Neg Hx    History  Sexual Activity  . Sexual activity: No    Outpatient Encounter Prescriptions as of 12/03/2016  Medication Sig  .  albuterol (PROVENTIL HFA;VENTOLIN HFA) 108 (90 BASE) MCG/ACT inhaler Inhale 2-4 puffs into the lungs every 4 (four) hours as needed for wheezing or shortness of breath.  . ALOE VERA PO Take 500 mg by mouth daily.  Marland Kitchen aspirin EC 81 MG tablet Take 81 mg by mouth daily.  Marland Kitchen buPROPion (WELLBUTRIN SR) 100 MG 12 hr tablet Take 1 tablet (100 mg total) by mouth 2 (two) times daily.  . Calcium Carbonate-Vitamin D (CALCIUM + D PO) Take 1 tablet by mouth daily.  . Cholecalciferol 400 units CAPS Take 1 capsule by mouth daily.  . fluticasone (FLONASE) 50  MCG/ACT nasal spray Place 2 sprays into both nostrils daily as needed for allergies or rhinitis.  . hydrochlorothiazide (HYDRODIURIL) 25 MG tablet TAKE 1 TABLET(25 MG) BY MOUTH DAILY  . montelukast (SINGULAIR) 10 MG tablet Take 1 tablet (10 mg total) by mouth at bedtime.  Marland Kitchen omeprazole (PRILOSEC) 20 MG capsule TAKE 1 CAPSULE BY MOUTH 30 MINS PRIOR TO BREAKFAST FOREVER  . pravastatin (PRAVACHOL) 40 MG tablet TAKE 1 TABLET(40 MG) BY MOUTH AT BEDTIME  . Red Yeast Rice 600 MG CAPS Take 1 capsule by mouth daily.  . sodium chloride (OCEAN) 0.65 % SOLN nasal spray Place 1 spray into both nostrils as needed for congestion.  . vitamin C (ASCORBIC ACID) 500 MG tablet Take 500 mg by mouth daily.   No facility-administered encounter medications on file as of 12/03/2016.     Activities of Daily Living In your present state of health, do you have any difficulty performing the following activities: 12/03/2016 02/20/2016  Hearing? N N  Vision? N N  Difficulty concentrating or making decisions? N N  Walking or climbing stairs? N Y  Dressing or bathing? N N  Doing errands, shopping? N N  Preparing Food and eating ? N -  Using the Toilet? N -  In the past six months, have you accidently leaked urine? N -  Do you have problems with loss of bowel control? N -  Managing your Medications? N -  Managing your Finances? N -  Housekeeping or managing your Housekeeping? N -  Some recent data might be hidden    Patient Care Team: Fayrene Helper, MD as PCP - General Herminio Commons, MD as Attending Physician (Cardiology) Danie Binder, MD as Consulting Physician (Gastroenterology)    Assessment:    Exercise Activities and Dietary recommendations Current Exercise Habits: The patient does not participate in regular exercise at present, Exercise limited by: None identified  Goals    . Quit smoking / using tobacco      Fall Risk Fall Risk  12/03/2016 02/20/2016 10/18/2015 06/22/2015 01/05/2015  Falls  in the past year? No No No No No   Depression Screen PHQ 2/9 Scores 12/03/2016 02/20/2016 10/18/2015 08/04/2013  PHQ - 2 Score 0 0 0 0  PHQ- 9 Score - - 1 -     Cognitive Function: Normal   6CIT Screen 12/03/2016  What Year? 0 points  What month? 0 points  What time? 0 points  Count back from 20 0 points  Months in reverse 0 points  Repeat phrase 0 points  Total Score 0    Immunization History  Administered Date(s) Administered  . H1N1 06/21/2008  . Influenza Split 05/20/2012  . Influenza Whole 05/07/2007, 04/19/2008, 05/09/2010, 03/30/2011  . Influenza,inj,Quad PF,36+ Mos 04/29/2013, 06/15/2014, 05/24/2015, 06/11/2016  . Pneumococcal Conjugate-13 02/16/2014  . Pneumococcal Polysaccharide-23 04/19/2008  . Td 07/24/1995, 03/17/2009   Screening  Tests Health Maintenance  Topic Date Due  . INFLUENZA VACCINE  02/20/2017  . MAMMOGRAM  02/12/2018  . TETANUS/TDAP  03/18/2019  . COLONOSCOPY  02/26/2023  . DEXA SCAN  Completed  . PNA vac Low Risk Adult  Completed      Plan:   I have personally reviewed and noted the following in the patient's chart:   . Medical and social history . Use of alcohol, tobacco or illicit drugs  . Current medications and supplements . Functional ability and status . Nutritional status . Physical activity . Advanced directives . List of other physicians . Hospitalizations, surgeries, and ER visits in previous 12 months . Vitals . Screenings to include cognitive, depression, and falls . Referrals and appointments: None  In addition, I have reviewed and discussed with patient certain preventive protocols, quality metrics, and best practice recommendations. A written personalized care plan for preventive services as well as general preventive health recommendations were provided to patient.     Stormy Fabian, LPN  9/92/4268

## 2016-12-03 NOTE — Assessment & Plan Note (Signed)
Patient educated about the importance of limiting  Carbohydrate intake , the need to commit to daily physical activity for a minimum of 30 minutes , and to commit weight loss. The fact that changes in all these areas will reduce or eliminate all together the development of diabetes is stressed.  Updated lab needed at/ before next visit.   Diabetic Labs Latest Ref Rng & Units 07/04/2016 02/20/2016 10/06/2015 07/14/2015 12/27/2014  HbA1c <5.7 % 6.0(H) 6.4(H) 6.3(H) - 6.2(H)  Microalbumin 0.00 - 1.89 mg/dL - - - - -  Micro/Creat Ratio 0.0 - 30.0 mg/g - - - - -  Chol <200 mg/dL 186 - 173 - 174  HDL >50 mg/dL 45(L) - 45(L) - 44(L)  Calc LDL <100 mg/dL 118(H) - 100 - 112(H)  Triglycerides <150 mg/dL 117 - 138 - 89  Creatinine 0.60 - 0.93 mg/dL 0.88 0.86 0.84 0.89 0.84   BP/Weight 12/03/2016 12/03/2016 10/10/2016 07/05/2016 07/04/2016 04/20/2016 6/96/7893  Systolic BP 810 175 102 585 277 824 235  Diastolic BP 80 80 80 83 80 100 74  Wt. (Lbs) 210 210 210 208.8 207.8 204 200.2  BMI 34.95 34.95 34.95 34.75 33.54 32.93 32.31   Foot/eye exam completion dates 05/02/2010  Foot exam Order yes  Foot Form Completion -

## 2016-12-03 NOTE — Patient Instructions (Signed)
F/u with MD in early October , call if you need me before  Labs today   Wellness today  Please work on good  health habits so that your health will improve. 1. Commitment to daily physical activity for 30 to 60  minutes, if you are able to do this.  2. Commitment to wise food choices. Aim for half of your  food intake to be vegetable and fruit, one quarter starchy foods, and one quarter protein. Try to eat on a regular schedule  3 meals per day, snacking between meals should be limited to vegetables or fruits or small portions of nuts. 64 ounces of water per day is generally recommended, unless you have specific health conditions, like heart failure or kidney failure where you will need to limit fluid intake.  3. Commitment to sufficient and a  good quality of physical and mental rest daily, generally between 6 to 8 hours per day.  WITH PERSISTANCE AND PERSEVERANCE, THE IMPOSSIBLE , BECOMES THE NORM! Thank you  for choosing [facility]. We consider it a privelige to serve you.  Delivering excellent health care in a caring and  compassionate way is our goal.  Partnering with you,  so that together we can achieve this goal is our strategy.

## 2016-12-03 NOTE — Patient Instructions (Addendum)
[name] , Thank you for taking time to come for your Medicare Wellness Visit. I appreciate your ongoing commitment to your health goals. Please review the following plan we discussed and let me know if I can assist you in the future.   Screening recommendations/referrals: Colonoscopy: Up to date, next due 02/2023 Mammogram: Up to date, next due 01/2017 Bone Density: Up to date Recommended yearly ophthalmology/optometry visit for glaucoma screening and checkup Recommended yearly dental visit for hygiene and checkup  Vaccinations: Influenza vaccine: Up to date, next due 02/2017 Pneumococcal vaccine: Up to date Tdap vaccine: Up to date, next due 02/2019 Shingles vaccine: Due, declines    Advanced directives: Advance directive discussed with you today. I have provided a copy for you to complete at home and have notarized. Once this is complete please bring a copy in to our office so we can scan it into your chart.  Conditions/risks identified: Obese, recommend starting a routine exercise program at least 3 days a week for 30-45 minutes at a time as tolerated.   Next appointment: Follow up in 1 year for your annual wellness visit.   Preventive Care 67 Years and Older, Female Preventive care refers to lifestyle choices and visits with your health care provider that can promote health and wellness. What does preventive care include?  A yearly physical exam. This is also called an annual well check.  Dental exams once or twice a year.  Routine eye exams. Ask your health care provider how often you should have your eyes checked.  Personal lifestyle choices, including:  Daily care of your teeth and gums.  Regular physical activity.  Eating a healthy diet.  Avoiding tobacco and drug use.  Limiting alcohol use.  Practicing safe sex.  Taking low-dose aspirin every day.  Taking vitamin and mineral supplements as recommended by your health care provider. What happens during an  annual well check? The services and screenings done by your health care provider during your annual well check will depend on your age, overall health, lifestyle risk factors, and family history of disease. Counseling  Your health care provider may ask you questions about your:  Alcohol use.  Tobacco use.  Drug use.  Emotional well-being.  Home and relationship well-being.  Sexual activity.  Eating habits.  History of falls.  Memory and ability to understand (cognition).  Work and work Statistician.  Reproductive health. Screening  You may have the following tests or measurements:  Height, weight, and BMI.  Blood pressure.  Lipid and cholesterol levels. These may be checked every 5 years, or more frequently if you are over 21 years old.  Skin check.  Lung cancer screening. You may have this screening every year starting at age 80 if you have a 30-pack-year history of smoking and currently smoke or have quit within the past 15 years.  Fecal occult blood test (FOBT) of the stool. You may have this test every year starting at age 29.  Flexible sigmoidoscopy or colonoscopy. You may have a sigmoidoscopy every 5 years or a colonoscopy every 10 years starting at age 81.  Hepatitis C blood test.  Hepatitis B blood test.  Sexually transmitted disease (STD) testing.  Diabetes screening. This is done by checking your blood sugar (glucose) after you have not eaten for a while (fasting). You may have this done every 1-3 years.  Bone density scan. This is done to screen for osteoporosis. You may have this done starting at age 60.  Mammogram. This may  be done every 1-2 years. Talk to your health care provider about how often you should have regular mammograms. Talk with your health care provider about your test results, treatment options, and if necessary, the need for more tests. Vaccines  Your health care provider may recommend certain vaccines, such as:  Influenza  vaccine. This is recommended every year.  Tetanus, diphtheria, and acellular pertussis (Tdap, Td) vaccine. You may need a Td booster every 10 years.  Zoster vaccine. You may need this after age 45.  Pneumococcal 13-valent conjugate (PCV13) vaccine. One dose is recommended after age 42.  Pneumococcal polysaccharide (PPSV23) vaccine. One dose is recommended after age 45. Talk to your health care provider about which screenings and vaccines you need and how often you need them. This information is not intended to replace advice given to you by your health care provider. Make sure you discuss any questions you have with your health care provider. Document Released: 08/05/2015 Document Revised: 03/28/2016 Document Reviewed: 05/10/2015 Elsevier Interactive Patient Education  2017 [employer] Prevention in the Home Falls can cause injuries. They can happen to people of all ages. There are many things you can do to make your home safe and to help prevent falls. What can I do on the outside of my home?  Regularly fix the edges of walkways and driveways and fix any cracks.  Remove anything that might make you trip as you walk through a door, such as a raised step or threshold.  Trim any bushes or trees on the path to your home.  Use bright outdoor lighting.  Clear any walking paths of anything that might make someone trip, such as rocks or tools.  Regularly check to see if handrails are loose or broken. Make sure that both sides of any steps have handrails.  Any raised decks and porches should have guardrails on the edges.  Have any leaves, snow, or ice cleared regularly.  Use sand or salt on walking paths during winter.  Clean up any spills in your garage right away. This includes oil or grease spills. What can I do in the bathroom?  Use night lights.  Install grab bars by the toilet and in the tub and shower. Do not use towel bars as grab bars.  Use non-skid mats or decals  in the tub or shower.  If you need to sit down in the shower, use a plastic, non-slip stool.  Keep the floor dry. Clean up any water that spills on the floor as soon as it happens.  Remove soap buildup in the tub or shower regularly.  Attach bath mats securely with double-sided non-slip rug tape.  Do not have throw rugs and other things on the floor that can make you trip. What can I do in the bedroom?  Use night lights.  Make sure that you have a light by your bed that is easy to reach.  Do not use any sheets or blankets that are too big for your bed. They should not hang down onto the floor.  Have a firm chair that has side arms. You can use this for support while you get dressed.  Do not have throw rugs and other things on the floor that can make you trip. What can I do in the kitchen?  Clean up any spills right away.  Avoid walking on wet floors.  Keep items that you use a lot in easy-to-reach places.  If you need to reach something above you,  use a strong step stool that has a grab bar.  Keep electrical cords out of the way.  Do not use floor polish or wax that makes floors slippery. If you must use wax, use non-skid floor wax.  Do not have throw rugs and other things on the floor that can make you trip. What can I do with my stairs?  Do not leave any items on the stairs.  Make sure that there are handrails on both sides of the stairs and use them. Fix handrails that are broken or loose. Make sure that handrails are as long as the stairways.  Check any carpeting to make sure that it is firmly attached to the stairs. Fix any carpet that is loose or worn.  Avoid having throw rugs at the top or bottom of the stairs. If you do have throw rugs, attach them to the floor with carpet tape.  Make sure that you have a light switch at the top of the stairs and the bottom of the stairs. If you do not have them, ask someone to add them for you. What else can I do to help  prevent falls?  Wear shoes that:  Do not have high heels.  Have rubber bottoms.  Are comfortable and fit you well.  Are closed at the toe. Do not wear sandals.  If you use a stepladder:  Make sure that it is fully opened. Do not climb a closed stepladder.  Make sure that both sides of the stepladder are locked into place.  Ask someone to hold it for you, if possible.  Clearly mark and make sure that you can see:  Any grab bars or handrails.  First and last steps.  Where the edge of each step is.  Use tools that help you move around (mobility aids) if they are needed. These include:  Canes.  Walkers.  Scooters.  Crutches.  Turn on the lights when you go into a dark area. Replace any light bulbs as soon as they burn out.  Set up your furniture so you have a clear path. Avoid moving your furniture around.  If any of your floors are uneven, fix them.  If there are any pets around you, be aware of where they are.  Review your medicines with your doctor. Some medicines can make you feel dizzy. This can increase your chance of falling. Ask your doctor what other things that you can do to help prevent falls. This information is not intended to replace advice given to you by your health care provider. Make sure you discuss any questions you have with your health care provider. Document Released: 05/05/2009 Document Revised: 12/15/2015 Document Reviewed: 08/13/2014 Elsevier Interactive Patient Education  2017 [employer].

## 2016-12-04 LAB — LIPID PANEL
CHOL/HDL RATIO: 4 ratio (ref 0.0–4.4)
Cholesterol, Total: 199 mg/dL (ref 100–199)
HDL: 50 mg/dL (ref >39)
LDL Calculated: 118 mg/dL — ABNORMAL HIGH (ref 0–99)
Triglycerides: 157 mg/dL — ABNORMAL HIGH (ref 0–149)
VLDL Cholesterol Cal: 31 mg/dL (ref 5–40)

## 2016-12-04 LAB — COMPREHENSIVE METABOLIC PANEL
ALT: 9 IU/L (ref 0–32)
AST: 15 IU/L (ref 0–40)
Albumin/Globulin Ratio: 1.3 (ref 1.2–2.2)
Albumin: 4 g/dL (ref 3.5–4.8)
Alkaline Phosphatase: 98 IU/L (ref 39–117)
BUN/Creatinine Ratio: 11 — ABNORMAL LOW (ref 12–28)
BUN: 10 mg/dL (ref 8–27)
Bilirubin Total: 0.5 mg/dL (ref 0.0–1.2)
CHLORIDE: 100 mmol/L (ref 96–106)
CO2: 25 mmol/L (ref 18–29)
Calcium: 9.4 mg/dL (ref 8.7–10.3)
Creatinine, Ser: 0.89 mg/dL (ref 0.57–1.00)
GFR, EST AFRICAN AMERICAN: 74 mL/min/{1.73_m2} (ref >59)
GFR, EST NON AFRICAN AMERICAN: 64 mL/min/{1.73_m2} (ref >59)
Globulin, Total: 3.1 g/dL (ref 1.5–4.5)
Glucose: 106 mg/dL — ABNORMAL HIGH (ref 65–99)
Potassium: 4.7 mmol/L (ref 3.5–5.2)
Sodium: 141 mmol/L (ref 134–144)
Total Protein: 7.1 g/dL (ref 6.0–8.5)

## 2016-12-04 LAB — TSH: TSH: 0.952 u[IU]/mL (ref 0.450–4.500)

## 2016-12-04 LAB — CBC
Hematocrit: 40.9 % (ref 34.0–46.6)
Hemoglobin: 13.5 g/dL (ref 11.1–15.9)
MCH: 28.8 pg (ref 26.6–33.0)
MCHC: 33 g/dL (ref 31.5–35.7)
MCV: 87 fL (ref 79–97)
PLATELETS: 300 10*3/uL (ref 150–379)
RBC: 4.68 x10E6/uL (ref 3.77–5.28)
RDW: 15.5 % — ABNORMAL HIGH (ref 12.3–15.4)
WBC: 8.4 10*3/uL (ref 3.4–10.8)

## 2016-12-04 LAB — HEMOGLOBIN A1C
Est. average glucose Bld gHb Est-mCnc: 131 mg/dL
Hgb A1c MFr Bld: 6.2 % — ABNORMAL HIGH (ref 4.8–5.6)

## 2016-12-20 ENCOUNTER — Ambulatory Visit (INDEPENDENT_AMBULATORY_CARE_PROVIDER_SITE_OTHER): Payer: Medicare Other | Admitting: Gastroenterology

## 2016-12-20 ENCOUNTER — Encounter: Payer: Self-pay | Admitting: Gastroenterology

## 2016-12-20 DIAGNOSIS — K219 Gastro-esophageal reflux disease without esophagitis: Secondary | ICD-10-CM

## 2016-12-20 DIAGNOSIS — K5901 Slow transit constipation: Secondary | ICD-10-CM

## 2016-12-20 DIAGNOSIS — R1319 Other dysphagia: Secondary | ICD-10-CM | POA: Diagnosis not present

## 2016-12-20 MED ORDER — LINACLOTIDE 290 MCG PO CAPS: ORAL_CAPSULE | ORAL | 11 refills | Status: DC

## 2016-12-20 NOTE — Progress Notes (Signed)
ON RECALL  °

## 2016-12-20 NOTE — Assessment & Plan Note (Signed)
SYMPTOMS CONTROLLED/RESOLVED.  CONTINUE OMEPRAZOLE.  TAKE 30 MINUTES PRIOR TO YOUR FIRST MEAL. FOLLOW UP IN 6 MOS.  

## 2016-12-20 NOTE — Patient Instructions (Signed)
DRINK WATER TO KEEP YOUR URINE LIGHT YELLOW.  CONTINUE YOUR WEIGHT LOSS EFFORTS.  FOLLOW A HIGH FIBER DIET. AVOID ITEMS THAT CAUSE BLOATING & GAS.  CONTINUE LINZESS.  FOLLOW UP IN 6 MOS.

## 2016-12-20 NOTE — Assessment & Plan Note (Signed)
SYMPTOMS NOT IDEALLY CONTROLLED.  DRINK WATER TO KEEP YOUR URINE LIGHT YELLOW. CONTINUE YOUR WEIGHT LOSS EFFORTS. FOLLOW A HIGH FIBER DIET. AVOID ITEMS THAT CAUSE BLOATING & GAS. RE-START  LINZESS. FOLLOW UP IN 6 MOS.

## 2016-12-20 NOTE — Progress Notes (Signed)
cc'ed to pcp °

## 2016-12-20 NOTE — Progress Notes (Signed)
Subjective:    Patient ID: [name], female    DOB: 06-07-1941, 76 y.o.   MRN: [id number]  Fayrene Helper, MD  HPI [place] BOWELS-WANTS TO TRY LINZESS 290 MCG BACK IN 2015. AMITIZA CAUSES CRAMPING. NO LOOSE STOOLS BUT MAY HAVE RECTAL URGENCY. NO GRANDKIDS ANYMORE. APPETITE GOOD. WEIGHT STABLE.    PT DENIES FEVER, CHILLS, HEMATOCHEZIA, HEMATEMESIS, nausea, vomiting, melena, diarrhea, CHEST PAIN, SHORTNESS OF BREATH,  CHANGE IN BOWEL IN HABITS, constipation, abdominal pain, problems swallowing, problems with sedation, heartburn or indigestion.  Past Medical History:  Diagnosis Date  . COPD (chronic obstructive pulmonary disease) ([place])   . Hiatal hernia without gangrene and obstruction 03/23/2015  . Hypertension 2004  . Lung nodule seen on imaging study   . Nicotine addiction   . OA (osteoarthritis) of knee   . Obesity   . Sinusitis    Past Surgical History:  Procedure Laterality Date  . bilateral cataract surgery  7/27 & 03/01/09   Dr. Gershon Crane    . cholecystectomy    . COLONOSCOPY  04/2004   Dr. Leane Para Smith-->melanosis coli  . COLONOSCOPY WITH ESOPHAGOGASTRODUODENOSCOPY (EGD) N/A 02/25/2013   Dr. Fields:Normal mucosa in the terminal ileum/Severe melanosis throughout the entire examined colon/ONE COLON POLYP REMOVED (tubular adenoma)/Mild diverticulosis  in the sigmoid colon/EGD:The mucosa of the esophagus appeared normal/Non-erosive gastritis, empiric dilation with Savary dilator  . ESOPHAGOGASTRODUODENOSCOPY  11/07/2010   Jenkins:hiatal hernia/no evidence of Barrett esophagitis.  The Z-line was noted to be at 39 cm from the teeth. CLO test negative  . ESOPHAGOGASTRODUODENOSCOPY N/A 09/11/2013   Dr.Fields-  incomplete esophageal web in mid-esophagus, medium sized hialtal hernia, PUD. bx=focal erosion with inflammation and fibrosis  . ESOPHAGOGASTRODUODENOSCOPY N/A 07/06/2014   Procedure: ESOPHAGOGASTRODUODENOSCOPY (EGD);  Surgeon: Danie Binder, MD;   [facility];  Service: Endoscopy;  Laterality: N/A;  . knee arthroscopy right    . LEFT HEART CATHETERIZATION WITH CORONARY ANGIOGRAM N/A 04/08/2014   Procedure: LEFT HEART CATHETERIZATION WITH CORONARY ANGIOGRAM;  Surgeon: Sinclair Grooms, MD;  Location: [facility] CATH LAB;  Service: Cardiovascular;  Laterality: N/A;  . MALONEY DILATION N/A 02/25/2013   Procedure: Venia Minks DILATION;  Surgeon: Danie Binder, MD;  Location: [facility];  Service: Endoscopy;  Laterality: N/A;  . PARTIAL HYSTERECTOMY    . SAVORY DILATION N/A 02/25/2013   Procedure: SAVORY DILATION;  Surgeon: Danie Binder, MD;  Location: [facility];  Service: Endoscopy;  Laterality: N/A;  . total knee arthroplasty right  05/29/2005   Dr. Aline Brochure    No Known Allergies  Current Outpatient Prescriptions  Medication Sig Dispense Refill  . albuterol (PROVENTIL HFA;VENTOLIN HFA) 108 (90 BASE) MCG/ACT inhaler Inhale 2-4 puffs into the lungs every 4 (four) hours as needed for wheezing or shortness of breath.    Marland Kitchen aspirin EC 81 MG tablet Take 81 mg by mouth daily.    . Calcium Carbonate-Vitamin D (CALCIUM + D PO) Take 1 tablet by mouth daily.    . fluticasone (FLONASE) 50 MCG/ACT nasal spray Place 2 sprays into both nostrils daily as needed for allergies or rhinitis.    Marland Kitchen omeprazole (PRILOSEC) 20 MG capsule TAKE 1 CAPSULE BY MOUTH 30 MINS PRIOR TO BREAKFAST FOREVER    . pravastatin (PRAVACHOL) 40 MG tablet TAKE 1 TABLET(40 MG) BY MOUTH AT BEDTIME    . Red Yeast Rice 600 MG CAPS Take 1 capsule by mouth daily.    . sodium chloride (OCEAN) 0.65 %  SOLN nasal spray Place 1 spray into both nostrils as needed for congestion.    . vitamin C (ASCORBIC ACID) 500 MG tablet Take 500 mg by mouth daily.    . ALOE VERA PO Take 500 mg by mouth daily.    Marland Kitchen buPROPion (WELLBUTRIN SR) 100 MG 12 hr tablet Take 1 tablet (100 mg total) by mouth 2 (two) times daily. (Patient not taking: Reported on 12/20/2016)    . Cholecalciferol 400 units CAPS Take  1 capsule by mouth daily.    .      .       Review of Systems PER HPI OTHERWISE ALL SYSTEMS ARE NEGATIVE.    Objective:   Physical Exam  Constitutional: She is oriented to person, place, and time. She appears well-developed and well-nourished. No distress.  HENT:  Head: Normocephalic and atraumatic.  Mouth/Throat: Oropharynx is clear and moist. No oropharyngeal exudate.  Eyes: Pupils are equal, round, and reactive to light. No scleral icterus.  Neck: Normal range of motion. Neck supple.  Cardiovascular: Normal rate, regular rhythm and normal heart sounds.   Pulmonary/Chest: Effort normal and breath sounds normal. No respiratory distress.  Abdominal: Soft. Bowel sounds are normal. She exhibits no distension. There is tenderness. There is no rebound and no guarding.  MILD TTP  IN LEFT PERIUMBILICAL AREA WITH PALPABLE NODULE  Musculoskeletal: She exhibits no edema.  Lymphadenopathy:    She has no cervical adenopathy.  Neurological: She is alert and oriented to person, place, and time.  NO FOCAL DEFICITS  Psychiatric: She has a normal mood and affect.  Vitals reviewed.     Assessment & Plan:

## 2016-12-20 NOTE — Assessment & Plan Note (Signed)
SYMPTOMS CONTROLLED/RESOLVED.  CONTINUE TO MONITOR SYMPTOMS. 

## 2017-02-05 ENCOUNTER — Other Ambulatory Visit: Payer: Self-pay | Admitting: Family Medicine

## 2017-02-05 DIAGNOSIS — Z1231 Encounter for screening mammogram for malignant neoplasm of breast: Secondary | ICD-10-CM

## 2017-02-14 ENCOUNTER — Telehealth: Payer: Self-pay | Admitting: Family Medicine

## 2017-02-14 NOTE — Telephone Encounter (Signed)
Patient called and left message on nurse line. She states she is feeling sick, has a bad cough, wants to know if she can have an appt for a shot

## 2017-02-14 NOTE — Telephone Encounter (Signed)
If there is availabilty this week with dr Meda Coffee pls offer an appt , if not pls offer an appt with me or with dr Meda Coffee, next week as a work in at [employer] availability

## 2017-02-18 ENCOUNTER — Ambulatory Visit (INDEPENDENT_AMBULATORY_CARE_PROVIDER_SITE_OTHER): Payer: Medicare Other | Admitting: Family Medicine

## 2017-02-18 ENCOUNTER — Ambulatory Visit (HOSPITAL_COMMUNITY)
Admission: RE | Admit: 2017-02-18 | Discharge: 2017-02-18 | Disposition: A | Discharge status: Home / Self Care | Payer: Medicare Other | Source: Ambulatory Visit | Attending: Family Medicine | Admitting: Family Medicine

## 2017-02-18 ENCOUNTER — Encounter: Payer: Self-pay | Admitting: Family Medicine

## 2017-02-18 VITALS — BP 120/80 | HR 84 | Temp 97.5°F | Resp 14 | Ht 66.0 in | Wt 211.0 lb

## 2017-02-18 DIAGNOSIS — J209 Acute bronchitis, unspecified: Secondary | ICD-10-CM | POA: Diagnosis not present

## 2017-02-18 DIAGNOSIS — I10 Essential (primary) hypertension: Secondary | ICD-10-CM | POA: Diagnosis not present

## 2017-02-18 DIAGNOSIS — Z1231 Encounter for screening mammogram for malignant neoplasm of breast: Secondary | ICD-10-CM | POA: Insufficient documentation

## 2017-02-18 DIAGNOSIS — F17200 Nicotine dependence, unspecified, uncomplicated: Secondary | ICD-10-CM | POA: Diagnosis not present

## 2017-02-18 DIAGNOSIS — J440 Chronic obstructive pulmonary disease with acute lower respiratory infection: Secondary | ICD-10-CM

## 2017-02-18 DIAGNOSIS — J0141 Acute recurrent pansinusitis: Secondary | ICD-10-CM

## 2017-02-18 DIAGNOSIS — J019 Acute sinusitis, unspecified: Secondary | ICD-10-CM

## 2017-02-18 DIAGNOSIS — F17210 Nicotine dependence, cigarettes, uncomplicated: Secondary | ICD-10-CM | POA: Diagnosis not present

## 2017-02-18 DIAGNOSIS — J0120 Acute ethmoidal sinusitis, unspecified: Secondary | ICD-10-CM | POA: Insufficient documentation

## 2017-02-18 MED ORDER — BENZONATATE 100 MG PO CAPS: 100.0000 mg | ORAL_CAPSULE | Freq: Two times a day (BID) | ORAL | 0 refills | Status: DC | PRN

## 2017-02-18 MED ORDER — PENICILLIN V POTASSIUM 500 MG PO TABS: 500.0000 mg | ORAL_TABLET | Freq: Three times a day (TID) | ORAL | 0 refills | Status: DC

## 2017-02-18 MED ORDER — PREDNISONE 5 MG PO TABS: 5.0000 mg | ORAL_TABLET | Freq: Two times a day (BID) | ORAL | 0 refills | Status: AC

## 2017-02-18 NOTE — Assessment & Plan Note (Addendum)
1 week h/o chest congestion 2day h/o chills and fever, thick sputum, decongestant, Pen V and prednisone x 5 days prescribed

## 2017-02-18 NOTE — Patient Instructions (Signed)
F/u In October as before, call ifyou need me sooner  Three medications are sent ion for bronchitis and sinusitis, penicillin, prednisone, and tessalonn pelrles, take all as directed  [place] you feel better soon  Thank you  for choosing [facility]. We consider it a privelige to serve you.  Delivering excellent health care in a caring and  compassionate way is our goal.  Partnering with you,  so that together we can achieve this goal is our strategy.

## 2017-02-18 NOTE — Assessment & Plan Note (Signed)

## 2017-02-18 NOTE — Assessment & Plan Note (Signed)
Pen V x 1 week and saline flushes recommended

## 2017-02-18 NOTE — Progress Notes (Signed)
   [name]     MRN: [id number]      DOB: [birth year]   HPI [name] 1 week h/o worsening head and chest congestion, associated with fever and chills intermittently. Nasal drainage has thickened , and is yellowish green, and at times bloody. Sputum is thick and yellow. C/o bilateral ear pressure, denies hearing loss and sore throat. Increasing fatigue , poor appetitie and sleep disturbed by cough. No improvement with OTC medication. it.  There are no new concerns.  There are no specific complaints   ROS See HPI Denies chest pains, palpitations and leg swelling Denies abdominal pain, nausea, vomiting,diarrhea or constipation.   Denies dysuria, frequency, hesitancy or incontinence. Denies uncontrolled  joint pain, swelling and limitation in mobility. Denies headaches, seizures, numbness, or tingling. Denies depression, anxiety or insomnia. Denies skin break down or rash.   PE  BP 120/80 (BP Location: Left Arm, Patient Position: Sitting, Cuff Size: Normal)   Pulse (!) 111   Temp (!) 97.5 F (36.4 C) (Other (Comment))   Resp 14   Ht 5\' 6"  (1.676 m)   Wt 211 lb (95.7 kg)   SpO2 96%   BMI 34.06 kg/m   Patient alert and oriented and in no cardiopulmonary distress.  HEENT: No facial asymmetry, EOMI,   oropharynx pink and moist.  Neck supple no JVD, no mass.Frontal and maxillary sinus tenderness, TM clear bilaterally  Chest: Adequate air entry, scattered crackles , no wheezes  CVS: S1, S2 no murmurs, no S3.Regular rate.  ABD: Soft non tender.   Ext: No edema  MS: Adequate ROM spine, shoulders, hips and knees.  Skin: Intact, no ulcerations or rash noted.  Psych: Good eye contact, normal affect. Memory intact not anxious or depressed appearing.  CNS: CN 2-12 intact, power,  normal throughout.no focal deficits noted.   Assessment & Plan  Acute bronchitis with COPD ([place]) 1 week h/o chest congestion 2day h/o chills and fever, thick sputum, decongestant, Pen V  and prednisone x 5 days prescribed  Acute sinusitis Pen V x 1 week and saline flushes recommended  Essential hypertension Controlled, no change in medication   NICOTINE ADDICTION Patient is asked and  confirms current  Nicotine use.  Five to seven minutes of time is spent in counseling the patient of the need to quit smoking  Advice to quit is delivered clearly specifically in reducing the risk of developing heart disease, having a stroke, or of developing all types of cancer, especially lung and oral cancer. Improvement in breathing and exercise tolerance and quality of life is also discussed, as is the economic benefit.  Assessment of willingness to quit or to make an attempt to quit is made and documented  Assistance in quit attempt is made with several and varied options presented, based on patient's desire and need. These include  literature, local classes available, 1800 QUIT NOW number, OTC and prescription medication.  The GOAL to be NICOTINE FREE is re emphasized.  The patient has set a personal goal of either reduction or discontinuation and follow up is arranged between 6 an 16 weeks.

## 2017-02-18 NOTE — Assessment & Plan Note (Signed)
Controlled, no change in medication  

## 2017-02-21 DIAGNOSIS — H35032 Hypertensive retinopathy, left eye: Secondary | ICD-10-CM | POA: Diagnosis not present

## 2017-02-21 DIAGNOSIS — H524 Presbyopia: Secondary | ICD-10-CM | POA: Diagnosis not present

## 2017-02-21 DIAGNOSIS — H52222 Regular astigmatism, left eye: Secondary | ICD-10-CM | POA: Diagnosis not present

## 2017-02-21 DIAGNOSIS — H5202 Hypermetropia, left eye: Secondary | ICD-10-CM | POA: Diagnosis not present

## 2017-03-29 ENCOUNTER — Other Ambulatory Visit: Payer: Self-pay | Admitting: Family Medicine

## 2017-04-01 NOTE — Telephone Encounter (Signed)
Seen [phone]

## 2017-04-24 ENCOUNTER — Encounter: Payer: Self-pay | Admitting: Gastroenterology

## 2017-05-06 ENCOUNTER — Encounter: Payer: Self-pay | Admitting: Family Medicine

## 2017-05-06 ENCOUNTER — Other Ambulatory Visit: Payer: Self-pay | Admitting: Family Medicine

## 2017-05-06 ENCOUNTER — Ambulatory Visit (INDEPENDENT_AMBULATORY_CARE_PROVIDER_SITE_OTHER): Payer: Medicare Other | Admitting: Family Medicine

## 2017-05-06 VITALS — BP 128/80 | HR 96 | Temp 97.9°F | Resp 16 | Ht 66.0 in | Wt 201.8 lb

## 2017-05-06 DIAGNOSIS — K219 Gastro-esophageal reflux disease without esophagitis: Secondary | ICD-10-CM

## 2017-05-06 DIAGNOSIS — E785 Hyperlipidemia, unspecified: Secondary | ICD-10-CM | POA: Diagnosis not present

## 2017-05-06 DIAGNOSIS — J302 Other seasonal allergic rhinitis: Secondary | ICD-10-CM

## 2017-05-06 DIAGNOSIS — I10 Essential (primary) hypertension: Secondary | ICD-10-CM

## 2017-05-06 DIAGNOSIS — F17210 Nicotine dependence, cigarettes, uncomplicated: Secondary | ICD-10-CM

## 2017-05-06 DIAGNOSIS — R7303 Prediabetes: Secondary | ICD-10-CM

## 2017-05-06 DIAGNOSIS — Z23 Encounter for immunization: Secondary | ICD-10-CM | POA: Diagnosis not present

## 2017-05-06 DIAGNOSIS — F17200 Nicotine dependence, unspecified, uncomplicated: Secondary | ICD-10-CM

## 2017-05-06 MED ORDER — MONTELUKAST SODIUM 10 MG PO TABS: 10.0000 mg | ORAL_TABLET | Freq: Every day | ORAL | 3 refills | Status: DC

## 2017-05-06 MED ORDER — PREDNISONE 5 MG PO TABS: 5.0000 mg | ORAL_TABLET | Freq: Two times a day (BID) | ORAL | 0 refills | Status: AC

## 2017-05-06 MED ORDER — BENZONATATE 100 MG PO CAPS: 100.0000 mg | ORAL_CAPSULE | Freq: Two times a day (BID) | ORAL | 0 refills | Status: DC | PRN

## 2017-05-06 NOTE — Assessment & Plan Note (Signed)
Uncontrolled, needs to commit to daily use of flonase, singular, and short course of tessalon perles prescribed

## 2017-05-06 NOTE — Patient Instructions (Addendum)
Physical exam with MD 12/15 or after, call if you need me sooner  Flu vaccine today  Need to QUIT smoking  Labs today, lipid, cmp and EGFr, hBA1C at [place]  Please work on good  health habits so that your health will improve. 1. Commitment to daily physical activity for 30 to 60  minutes, if you are able to do this.  2. Commitment to wise food choices. Aim for half of your  food intake to be vegetable and fruit, one quarter starchy foods, and one quarter protein. Try to eat on a regular schedule  3 meals per day, snacking between meals should be limited to vegetables or fruits or small portions of nuts. 64 ounces of water per day is generally recommended, unless you have specific health conditions, like heart failure or kidney failure where you will need to limit fluid intake.  3. Commitment to sufficient and a  good quality of physical and mental rest daily, generally between 6 to 8 hours per day.  WITH PERSISTANCE AND PERSEVERANCE, THE IMPOSSIBLE , BECOMES THE NORM! It is important that you exercise regularly at least 30 minutes 5 times a week. If you develop chest pain, have severe difficulty breathing, or feel very tired, stop exercising immediately and seek medical attention    Thank you  for choosing [facility]. We consider it a privelige to serve you.  Delivering excellent health care in a caring and  compassionate way is our goal.  Partnering with you,  so that together we can achieve this goal is our strategy.   Medication sent for uncontrolled allergies, tessalon perles,prednisone for 5 days , and once daily singulair

## 2017-05-06 NOTE — Assessment & Plan Note (Signed)
Patient educated about the importance of limiting  Carbohydrate intake , the need to commit to daily physical activity for a minimum of 30 minutes , and to commit weight loss. The fact that changes in all these areas will reduce or eliminate all together the development of diabetes is stressed.   Diabetic Labs Latest Ref Rng & Units 12/03/2016 07/04/2016 02/20/2016 10/06/2015 07/14/2015  HbA1c 4.8 - 5.6 % 6.2(H) 6.0(H) 6.4(H) 6.3(H) -  Microalbumin 0.00 - 1.89 mg/dL - - - - -  Micro/Creat Ratio 0.0 - 30.0 mg/g - - - - -  Chol 100 - 199 mg/dL 199 186 - 173 -  HDL >39 mg/dL 50 45(L) - 45(L) -  Calc LDL 0 - 99 mg/dL 118(H) 118(H) - 100 -  Triglycerides 0 - 149 mg/dL 157(H) 117 - 138 -  Creatinine 0.57 - 1.00 mg/dL 0.89 0.88 0.86 0.84 0.89   BP/Weight 05/06/2017 02/18/2017 12/20/2016 12/03/2016 12/03/2016 10/10/2016 49/20/1007  Systolic BP 121 975 [phone]  Diastolic BP 80 80 79 80 80 80 83  Wt. (Lbs) 201.75 211 209.4 210 210 210 208.8  BMI 32.56 34.06 33.8 34.95 34.95 34.95 34.75   Foot/eye exam completion dates 05/02/2010  Foot exam Order yes  Foot Form Completion -    Updated lab needed at/ before next visit.

## 2017-05-06 NOTE — Assessment & Plan Note (Signed)
Controlled, no change in medication DASH diet and commitment to daily physical activity for a minimum of 30 minutes discussed and encouraged, as a part of hypertension management. The importance of attaining a healthy weight is also discussed.  BP/Weight 05/06/2017 02/18/2017 12/20/2016 12/03/2016 12/03/2016 10/10/2016 22/08/5425  Systolic BP 062 376 283 151 [phone]  Diastolic BP 80 80 79 80 80 80 83  Wt. (Lbs) 201.75 211 209.4 210 210 210 208.8  BMI 32.56 34.06 33.8 34.95 34.95 34.95 34.75

## 2017-05-06 NOTE — Progress Notes (Signed)
CRISSIE ALOI     MRN: [id number]      DOB: 1940/10/25   HPI Ms. Cragin is here for follow up and re-evaluation of chronic medical conditions, medication management and review of any available recent lab and radiology data.  Preventive health is updated, specifically  Cancer screening and Immunization.   Questions or concerns regarding consultations or procedures which the PT has had in the interim are  addressed. The PT denies any adverse reactions to current medications since the last visit.  There are no new concerns.  There are no specific complaints   ROS Denies recent fever or chills. Denies sinus pressure, nasal congestion, ear pain or sore throat.increased and uncontrolled post nasal drainage , but non compliant with allergy medication Denies chest congestion, productive cough or wheezing. Denies chest pains, palpitations and leg swelling Denies abdominal pain, nausea, vomiting,diarrhea or constipation.   Denies dysuria, frequency, hesitancy or incontinence. Denies joint pain, swelling and limitation in mobility. Denies headaches, seizures, numbness, or tingling. Denies depression, anxiety or insomnia. Denies skin break down or rash.   PE  BP 128/80 (BP Location: Left Arm, Patient Position: Sitting, Cuff Size: Normal)   Pulse 96   Temp 97.9 F (36.6 C) (Other (Comment))   Resp 16   Ht 5\' 6"  (1.676 m)   Wt 201 lb 12 oz (91.5 kg)   SpO2 96%   BMI 32.56 kg/m   Patient alert and oriented and in no cardiopulmonary distress.  HEENT: No facial asymmetry, EOMI,   oropharynx pink and moist.  Neck supple no JVD, no mass.  Chest: Clear to auscultation bilaterally.  CVS: S1, S2 no murmurs, no S3.Regular rate.  ABD: Soft non tender.   Ext: No edema  MS: Adequate ROM spine, shoulders, hips and knees.  Skin: Intact, no ulcerations or rash noted.  Psych: Good eye contact, normal affect. Memory intact not anxious or depressed appearing.  CNS: CN 2-12 intact, power,   normal throughout.no focal deficits noted.   Assessment & Plan  Essential hypertension Controlled, no change in medication DASH diet and commitment to daily physical activity for a minimum of 30 minutes discussed and encouraged, as a part of hypertension management. The importance of attaining a healthy weight is also discussed.  BP/Weight 05/06/2017 02/18/2017 12/20/2016 12/03/2016 12/03/2016 10/10/2016 37/04/6268  Systolic BP 485 462 703 500 938 182 993  Diastolic BP 80 80 79 80 80 80 83  Wt. (Lbs) 201.75 211 209.4 210 210 210 208.8  BMI 32.56 34.06 33.8 34.95 34.95 34.95 34.75       GERD (gastroesophageal reflux disease) Controlled and managed by gI  Prediabetes Patient educated about the importance of limiting  Carbohydrate intake , the need to commit to daily physical activity for a minimum of 30 minutes , and to commit weight loss. The fact that changes in all these areas will reduce or eliminate all together the development of diabetes is stressed.   Diabetic Labs Latest Ref Rng & Units 12/03/2016 07/04/2016 02/20/2016 10/06/2015 07/14/2015  HbA1c 4.8 - 5.6 % 6.2(H) 6.0(H) 6.4(H) 6.3(H) -  Microalbumin 0.00 - 1.89 mg/dL - - - - -  Micro/Creat Ratio 0.0 - 30.0 mg/g - - - - -  Chol 100 - 199 mg/dL 199 186 - 173 -  HDL >39 mg/dL 50 45(L) - 45(L) -  Calc LDL 0 - 99 mg/dL 118(H) 118(H) - 100 -  Triglycerides 0 - 149 mg/dL 157(H) 117 - 138 -  Creatinine 0.57 - 1.00  mg/dL 0.89 0.88 0.86 0.84 0.89   BP/Weight 05/06/2017 02/18/2017 12/20/2016 12/03/2016 12/03/2016 10/10/2016 02/63/7858  Systolic BP 850 277 412 878 676 720 947  Diastolic BP 80 80 79 80 80 80 83  Wt. (Lbs) 201.75 211 209.4 210 210 210 208.8  BMI 32.56 34.06 33.8 34.95 34.95 34.95 34.75   Foot/eye exam completion dates 05/02/2010  Foot exam Order yes  Foot Form Completion -    Updated lab needed at/ before next visit.   NICOTINE ADDICTION Patient is asked and  confirms current  Nicotine use.  Five to seven minutes  of time is spent in counseling the patient of the need to quit smoking  Advice to quit is delivered clearly specifically in reducing the risk of developing heart disease, having a stroke, or of developing all types of cancer, especially lung and oral cancer. Improvement in breathing and exercise tolerance and quality of life is also discussed, as is the economic benefit.  Assessment of willingness to quit or to make an attempt to quit is made and documented  Assistance in quit attempt is made with several and varied options presented, based on patient's desire and need. These include  literature, local classes available, 1800 QUIT NOW number, OTC and prescription medication.  The GOAL to be NICOTINE FREE is re emphasized.  The patient has set a personal goal of either reduction or discontinuation and follow up is arranged between 6 an 16 weeks.    Hyperlipidemia LDL goal <100 Hyperlipidemia:Low fat diet discussed and encouraged.   Lipid Panel  Lab Results  Component Value Date   CHOL 199 12/03/2016   HDL 50 12/03/2016   LDLCALC 118 (H) 12/03/2016   TRIG 157 (H) 12/03/2016   CHOLHDL 4.0 12/03/2016   Uncontrolled Updated lab needed at/ before next visit.     Seasonal allergies Uncontrolled, needs to commit to daily use of flonase, singular, and short course of tessalon perles prescribed

## 2017-05-06 NOTE — Assessment & Plan Note (Signed)

## 2017-05-06 NOTE — Assessment & Plan Note (Signed)
Hyperlipidemia:Low fat diet discussed and encouraged.   Lipid Panel  Lab Results  Component Value Date   CHOL 199 12/03/2016   HDL 50 12/03/2016   LDLCALC 118 (H) 12/03/2016   TRIG 157 (H) 12/03/2016   CHOLHDL 4.0 12/03/2016   Uncontrolled Updated lab needed at/ before next visit.

## 2017-05-06 NOTE — Assessment & Plan Note (Signed)
Controlled and managed by gI

## 2017-05-07 ENCOUNTER — Encounter: Payer: Self-pay | Admitting: Family Medicine

## 2017-05-07 LAB — COMPREHENSIVE METABOLIC PANEL
A/G RATIO: 1.5 (ref 1.2–2.2)
ALK PHOS: 102 IU/L (ref 39–117)
ALT: 10 IU/L (ref 0–32)
AST: 18 IU/L (ref 0–40)
Albumin: 4.3 g/dL (ref 3.5–4.8)
BILIRUBIN TOTAL: 0.6 mg/dL (ref 0.0–1.2)
BUN/Creatinine Ratio: 9 — ABNORMAL LOW (ref 12–28)
BUN: 9 mg/dL (ref 8–27)
CHLORIDE: 101 mmol/L (ref 96–106)
CO2: 23 mmol/L (ref 20–29)
Calcium: 9.5 mg/dL (ref 8.7–10.3)
Creatinine, Ser: 0.95 mg/dL (ref 0.57–1.00)
GFR calc Af Amer: 68 mL/min/{1.73_m2} (ref >59)
GFR, EST NON AFRICAN AMERICAN: 59 mL/min/{1.73_m2} — AB (ref >59)
GLOBULIN, TOTAL: 2.9 g/dL (ref 1.5–4.5)
Glucose: 112 mg/dL — ABNORMAL HIGH (ref 65–99)
POTASSIUM: 4.2 mmol/L (ref 3.5–5.2)
SODIUM: 142 mmol/L (ref 134–144)
Total Protein: 7.2 g/dL (ref 6.0–8.5)

## 2017-05-07 LAB — HGB A1C W/O EAG: HEMOGLOBIN A1C: 6 % — AB (ref 4.8–5.6)

## 2017-05-07 LAB — LIPID PANEL W/O CHOL/HDL RATIO
CHOLESTEROL TOTAL: 191 mg/dL (ref 100–199)
HDL: 44 mg/dL (ref >39)
LDL Calculated: 125 mg/dL — ABNORMAL HIGH (ref 0–99)
TRIGLYCERIDES: 108 mg/dL (ref 0–149)
VLDL Cholesterol Cal: 22 mg/dL (ref 5–40)

## 2017-05-07 LAB — SPECIMEN STATUS REPORT

## 2017-05-22 ENCOUNTER — Ambulatory Visit (INDEPENDENT_AMBULATORY_CARE_PROVIDER_SITE_OTHER): Payer: Medicare Other | Admitting: Nurse Practitioner

## 2017-05-22 ENCOUNTER — Encounter: Payer: Self-pay | Admitting: Nurse Practitioner

## 2017-05-22 VITALS — BP 158/91 | HR 100 | Temp 97.4°F | Ht 66.0 in | Wt 201.0 lb

## 2017-05-22 DIAGNOSIS — K219 Gastro-esophageal reflux disease without esophagitis: Secondary | ICD-10-CM

## 2017-05-22 DIAGNOSIS — K5901 Slow transit constipation: Secondary | ICD-10-CM | POA: Diagnosis not present

## 2017-05-22 DIAGNOSIS — R1319 Other dysphagia: Secondary | ICD-10-CM

## 2017-05-22 NOTE — Assessment & Plan Note (Signed)
Constipation does well on Linzess 290 g. She is out of her medication currently and does not constipated for 1-2 weeks. She is on a fixed income and a co-pay of $45. We will provide her samples to last her until she gets paid and can afford to refill her medicine. Return for follow-up in 6 months. Call if any problems or concerns.

## 2017-05-22 NOTE — Assessment & Plan Note (Signed)
No recurrent dysphagia symptoms with well managed GERD. Continue PPI, return for follow-up in 6 months. Call us of any recurrent symptoms.

## 2017-05-22 NOTE — Assessment & Plan Note (Signed)
GERD symptoms currently well managed. Recommend continue PPI. Return for follow-up in 6 months.

## 2017-05-22 NOTE — Progress Notes (Signed)
cc'ed to pcp °

## 2017-05-22 NOTE — Patient Instructions (Signed)
1. Continue taking your medications, including Linzess. 2. I'm providing you with samples of Linzess 290 g to continue to take until you can get a refill. 3. Return for follow-up in 6 months. 4. Call us if you have any questions or concerns.

## 2017-05-22 NOTE — Progress Notes (Signed)
Referring Provider: Fayrene Helper, MD Primary Care Physician:  Fayrene Helper, MD Primary GI:  Dr. Oneida Alar  Chief Complaint  Patient presents with  . Follow-up    constipation/bloating.    HPI:   [name] is a 76 y.o. female who presents for follow-up on constipation and bloating. The patient was last seen in our office 12/20/2016 for slow transit constipation, GERD, dysphagia. Amitiza previously cause cramping. Was requesting to try Linzess 290 g. Dysphagia symptoms resolved. GERD symptoms well controlled medically continue omeprazole. Recommended restart Linzess. Followup in 6 months.  Today she states she's doing ok overall. Constipation does well on Linzess. Cannot afford her copay right now for the medication because she only gets paid once a month and is on fixed income. When she's on Linzess has stools consistent with bristol 4. GERD well managed. No further dysphagia symptoms. Denies hematochezia, melena, fever, chills, unintentional weight loss. Denies chest pain, dyspnea, dizziness, lightheadedness, syncope, near syncope. Denies any other upper or lower GI symptoms.  Past Medical History:  Diagnosis Date  . COPD (chronic obstructive pulmonary disease) ([place])   . Hiatal hernia without gangrene and obstruction 03/23/2015  . Hypertension 2004  . Lung nodule seen on imaging study   . Nicotine addiction   . OA (osteoarthritis) of knee   . Obesity   . Sinusitis     Past Surgical History:  Procedure Laterality Date  . bilateral cataract surgery  7/27 & 03/01/09   Dr. Gershon Crane    . cholecystectomy    . COLONOSCOPY  04/2004   Dr. Leane Para Smith-->melanosis coli  . COLONOSCOPY WITH ESOPHAGOGASTRODUODENOSCOPY (EGD) N/A 02/25/2013   Dr. Fields:Normal mucosa in the terminal ileum/Severe melanosis throughout the entire examined colon/ONE COLON POLYP REMOVED (tubular adenoma)/Mild diverticulosis  in the sigmoid colon/EGD:The mucosa of the esophagus appeared  normal/Non-erosive gastritis, empiric dilation with Savary dilator  . ESOPHAGOGASTRODUODENOSCOPY  11/07/2010   Jenkins:hiatal hernia/no evidence of Barrett esophagitis.  The Z-line was noted to be at 39 cm from the teeth. CLO test negative  . ESOPHAGOGASTRODUODENOSCOPY N/A 09/11/2013   Dr.Fields-  incomplete esophageal web in mid-esophagus, medium sized hialtal hernia, PUD. bx=focal erosion with inflammation and fibrosis  . ESOPHAGOGASTRODUODENOSCOPY N/A 07/06/2014   Procedure: ESOPHAGOGASTRODUODENOSCOPY (EGD);  Surgeon: Danie Binder, MD;  Location: [facility];  Service: Endoscopy;  Laterality: N/A;  . knee arthroscopy right    . LEFT HEART CATHETERIZATION WITH CORONARY ANGIOGRAM N/A 04/08/2014   Procedure: LEFT HEART CATHETERIZATION WITH CORONARY ANGIOGRAM;  Surgeon: Sinclair Grooms, MD;  Location: [facility] CATH LAB;  Service: Cardiovascular;  Laterality: N/A;  . MALONEY DILATION N/A 02/25/2013   Procedure: Venia Minks DILATION;  Surgeon: Danie Binder, MD;  Location: [facility];  Service: Endoscopy;  Laterality: N/A;  . PARTIAL HYSTERECTOMY    . SAVORY DILATION N/A 02/25/2013   Procedure: SAVORY DILATION;  Surgeon: Danie Binder, MD;  Location: [facility];  Service: Endoscopy;  Laterality: N/A;  . total knee arthroplasty right  05/29/2005   Dr. Aline Brochure     Current Outpatient Prescriptions  Medication Sig Dispense Refill  . albuterol (PROVENTIL HFA;VENTOLIN HFA) 108 (90 BASE) MCG/ACT inhaler Inhale 2-4 puffs into the lungs every 4 (four) hours as needed for wheezing or shortness of breath. 1 Inhaler 0  . ALOE VERA PO Take 500 mg by mouth daily.    Marland Kitchen aspirin EC 81 MG tablet Take 81 mg by mouth daily.    . benzonatate (TESSALON) 100 MG  capsule Take 1 capsule (100 mg total) by mouth 2 (two) times daily as needed for cough. 20 capsule 0  . Calcium Carbonate-Vitamin D (CALCIUM + D PO) Take 1 tablet by mouth daily.    . Cholecalciferol 400 units CAPS Take 1 capsule by mouth daily.    .  fluticasone (FLONASE) 50 MCG/ACT nasal spray Place 2 sprays into both nostrils daily as needed for allergies or rhinitis. 16 g 5  . hydrochlorothiazide (HYDRODIURIL) 25 MG tablet TAKE 1 TABLET(25 MG) BY MOUTH DAILY 90 tablet 1  . montelukast (SINGULAIR) 10 MG tablet Take 1 tablet (10 mg total) by mouth at bedtime. 30 tablet 3  . omeprazole (PRILOSEC) 20 MG capsule TAKE 1 CAPSULE BY MOUTH 30 MINS PRIOR TO BREAKFAST FOREVER 30 capsule 11  . pravastatin (PRAVACHOL) 40 MG tablet TAKE 1 TABLET(40 MG) BY MOUTH AT BEDTIME 90 tablet 1  . Red Yeast Rice 600 MG CAPS Take 1 capsule by mouth daily.    . sodium chloride (OCEAN) 0.65 % SOLN nasal spray Place 1 spray into both nostrils as needed for congestion.    . vitamin C (ASCORBIC ACID) 500 MG tablet Take 500 mg by mouth daily.    Marland Kitchen linaclotide (LINZESS) 290 MCG CAPS capsule 1 PO 30 MINS BEFORE BREAKFAST. IT MAY CAUSE DIARRHEA. (Patient not taking: Reported on 05/22/2017) 30 capsule 11   No current facility-administered medications for this visit.     Allergies as of 05/22/2017  . (No Known Allergies)    Family History  Problem Relation Age of Onset  . Diabetes Mother   . Hypertension Mother   . Coronary artery disease Mother   . Pneumonia Father   . Hypertension Sister   . Hypertension Sister   . Hypertension Brother        Psychologist, forensic  . Diabetes Brother   . Hypertension Brother   . Colon cancer Neg Hx   . Liver disease Neg Hx     Social History   Social History  . Marital status: Widowed    Spouse name: N/A  . Number of children: 4  . Years of education: N/A   Occupational History  . employed  Retired   Social History Main Topics  . Smoking status: Current Some Day Smoker    Packs/day: 0.30    Years: 10.00    Types: Cigarettes    Start date: 07/23/1962  . Smokeless tobacco: Never Used     Comment: 3 per day   . Alcohol use No  . Drug use: No  . Sexual activity: No   Other Topics Concern  . None   Social History  Narrative  . None    Review of Systems: Complete ROS negative except as per HPI.   Physical Exam: BP (!) 158/91   Pulse 100   Temp (!) 97.4 F (36.3 C) (Oral)   Ht 5\' 6"  (1.676 m)   Wt 201 lb (91.2 kg)   BMI 32.44 kg/m  General:   Obese female. Alert and oriented. Pleasant and cooperative. Well-nourished and well-developed.  Eyes:  Without icterus, sclera clear and conjunctiva pink.  Ears:  Normal auditory acuity. Cardiovascular:  S1, S2 present without murmurs appreciated. Extremities without clubbing or edema. Respiratory:  Clear to auscultation bilaterally. No wheezes, rales, or rhonchi. No distress.  Gastrointestinal:  +BS, rounded but soft, non-tender and non-distended. No HSM noted. No guarding or rebound. No masses appreciated.  Rectal:  Deferred  Musculoskalatal:  Symmetrical without gross deformities. Neurologic:  Alert and oriented x4;  grossly normal neurologically. Psych:  Alert and cooperative. Normal mood and affect. Heme/Lymph/Immune: No excessive bruising noted.    05/22/2017 9:59 AM   Disclaimer: This note was dictated with voice recognition software. Similar sounding words can inadvertently be transcribed and may not be corrected upon review.

## 2017-06-24 ENCOUNTER — Other Ambulatory Visit: Payer: Self-pay | Admitting: Family Medicine

## 2017-06-24 ENCOUNTER — Telehealth: Payer: Self-pay | Admitting: Family Medicine

## 2017-06-24 DIAGNOSIS — R05 Cough: Secondary | ICD-10-CM

## 2017-06-24 DIAGNOSIS — J40 Bronchitis, not specified as acute or chronic: Secondary | ICD-10-CM

## 2017-06-24 DIAGNOSIS — R058 Other specified cough: Secondary | ICD-10-CM

## 2017-06-24 NOTE — Telephone Encounter (Signed)
Message left for patient

## 2017-06-24 NOTE — Addendum Note (Signed)
Addended by: Eual Fines on: 06/24/2017 02:08 PM   Modules accepted: Orders

## 2017-06-24 NOTE — Telephone Encounter (Signed)
Left message for patient

## 2017-06-24 NOTE — Progress Notes (Signed)
Dg cxr  

## 2017-06-24 NOTE — Telephone Encounter (Signed)
Cb#: [phone]  Patient said she is still coughing up infection (yellow mucus) she is requesting an appt or medication

## 2017-06-24 NOTE — Telephone Encounter (Signed)
She needs a CXR, which I have ordered and sputum sent for c/s before I can treatm, pls order the sputum ,

## 2017-06-24 NOTE — Telephone Encounter (Signed)
States she still has a cough and producing yellow mucus. Was treated back in October for this too.  No fever. Wants something sent in

## 2017-06-25 ENCOUNTER — Ambulatory Visit (HOSPITAL_COMMUNITY)
Admission: RE | Admit: 2017-06-25 | Discharge: 2017-06-25 | Disposition: A | Discharge status: Home / Self Care | Payer: Medicare Other | Source: Ambulatory Visit | Attending: Family Medicine | Admitting: Family Medicine

## 2017-06-25 DIAGNOSIS — J40 Bronchitis, not specified as acute or chronic: Secondary | ICD-10-CM

## 2017-06-25 DIAGNOSIS — R05 Cough: Secondary | ICD-10-CM | POA: Diagnosis not present

## 2017-06-26 ENCOUNTER — Other Ambulatory Visit: Payer: Self-pay | Admitting: Family Medicine

## 2017-06-26 MED ORDER — BENZONATATE 100 MG PO CAPS: 100.0000 mg | ORAL_CAPSULE | Freq: Two times a day (BID) | ORAL | 0 refills | Status: DC | PRN

## 2017-06-26 MED ORDER — PREDNISONE 5 MG PO TABS: 5.0000 mg | ORAL_TABLET | Freq: Two times a day (BID) | ORAL | 0 refills | Status: AC

## 2017-06-26 NOTE — Telephone Encounter (Signed)
Left message for patient to call back  

## 2017-08-01 ENCOUNTER — Ambulatory Visit (INDEPENDENT_AMBULATORY_CARE_PROVIDER_SITE_OTHER): Payer: Medicare Other | Admitting: Family Medicine

## 2017-08-01 ENCOUNTER — Encounter: Payer: Self-pay | Admitting: Family Medicine

## 2017-08-01 VITALS — BP 148/90 | HR 89 | Resp 16 | Ht 66.0 in | Wt 207.0 lb

## 2017-08-01 DIAGNOSIS — Z0000 Encounter for general adult medical examination without abnormal findings: Secondary | ICD-10-CM | POA: Diagnosis not present

## 2017-08-01 DIAGNOSIS — E785 Hyperlipidemia, unspecified: Secondary | ICD-10-CM

## 2017-08-01 DIAGNOSIS — Z1211 Encounter for screening for malignant neoplasm of colon: Secondary | ICD-10-CM | POA: Diagnosis not present

## 2017-08-01 DIAGNOSIS — I10 Essential (primary) hypertension: Secondary | ICD-10-CM

## 2017-08-01 DIAGNOSIS — F17200 Nicotine dependence, unspecified, uncomplicated: Secondary | ICD-10-CM

## 2017-08-01 DIAGNOSIS — R7303 Prediabetes: Secondary | ICD-10-CM

## 2017-08-01 DIAGNOSIS — E559 Vitamin D deficiency, unspecified: Secondary | ICD-10-CM

## 2017-08-01 MED ORDER — AMLODIPINE BESYLATE 5 MG PO TABS: 5.0000 mg | ORAL_TABLET | Freq: Every day | ORAL | 3 refills | Status: DC

## 2017-08-01 NOTE — Assessment & Plan Note (Signed)
Uncontroled, add amlodipine 5 mg  DASH diet and commitment to daily physical activity for a minimum of 30 minutes discussed and encouraged, as a part of hypertension management. The importance of attaining a healthy weight is also discussed.  BP/Weight 08/01/2017 05/22/2017 05/06/2017 02/18/2017 12/20/2016 12/03/2016 7/73/7366  Systolic BP 815 947 076 151 834 373 578  Diastolic BP 90 91 80 80 79 80 80  Wt. (Lbs) 207 201 201.75 211 209.4 210 210  BMI 33.41 32.44 32.56 34.06 33.8 34.95 34.95

## 2017-08-01 NOTE — Patient Instructions (Addendum)
F/u mid  March, call if you need me sooner  Fasting lipid, cmp and eGFr , hBA1C and vit D 5 days before next visit  Blood pressure is high, new additional medication, amlodipine , 57m one daily is prescribed, continue to take your current blood pressure medicine also  Need to QUIT SMOKING ALTOGETHER!!!  It is important that you exercise regularly at least 30 minutes 5 times a week. If you develop chest pain, have severe difficulty breathing, or feel very tired, stop exercising immediately and seek medical attention  Please work on good  health habits so that your health will improve. 1. Commitment to daily physical activity for 30 to 60  minutes, if you are able to do this.  2. Commitment to wise food choices. Aim for half of your  food intake to be vegetable and fruit, one quarter starchy foods, and one quarter protein. Try to eat on a regular schedule  3 meals per day, snacking between meals should be limited to vegetables or fruits or small portions of nuts. 64 ounces of water per day is generally recommended, unless you have specific health conditions, like heart failure or kidney failure where you will need to limit fluid intake.  3. Commitment to sufficient and a  good quality of physical and mental rest daily, generally between 6 to 8 hours per day.  WITH PERSISTANCE AND PERSEVERANCE, THE IMPOSSIBLE , BECOMES THE NORM!

## 2017-08-01 NOTE — Assessment & Plan Note (Signed)

## 2017-08-01 NOTE — Progress Notes (Signed)
[name]     MRN: [id number]      DOB: 1940-08-30  HPI: Patient is in for annual physical exam. Blood pressure remains elevated and medication adjustment is made  Immunization is reviewed , she refuses influenza vaccine   PE: BP (!) 148/90   Pulse 89   Resp 16   Ht 5\' 6"  (1.676 m)   Wt 207 lb (93.9 kg)   SpO2 95%   BMI 33.41 kg/m   Pleasant  female, alert and oriented x 3, in no cardio-pulmonary distress. Afebrile. HEENT No facial trauma or asymetry. Sinuses non tender.  Extra occullar muscles intact,  External ears normal, tympanic membranes clear. Oropharynx moist, no exudate. Neck: supple, no adenopathy,JVD or thyromegaly.No bruits.  Chest: Clear to ascultation bilaterally.No crackles or wheezes. Non tender to palpation  Breast: No asymetry,no masses or lumps. No tenderness. No nipple discharge or inversion. No axillary or supraclavicular adenopathy  Cardiovascular system; Heart sounds normal,  S1 and  S2 ,no S3.  No murmur, or thrill. Apical beat not displaced Peripheral pulses normal.  Abdomen: Obese, distended. Soft, mild epigastric tenderness, no organomegaly or masses appreciated  No bruits. Bowel sounds normal. No guarding, or rebound.  Rectal:  Normal sphincter tone.Hemmorhoids No rectal mass. Guaiac negative stool.  GU: Not examined  Musculoskeletal exam: Decreased ROM of spine, hips , shoulders and knees. No deformity ,however  crepitus noted. No muscle wasting or atrophy.   Neurologic: Cranial nerves 2 to 12 intact. Power, tone ,sensation and reflexes normal throughout. No disturbance in gait. No tremor.  Skin: Intact, no ulceration, erythema , scaling or rash noted. Pigmentation normal throughout  Psych; Normal mood and affect. Judgement and concentration normal   Assessment & Plan:  Annual physical exam Annual exam as documented. Counseling done  re healthy lifestyle involving commitment to 150 minutes exercise per  week, heart healthy diet, and attaining healthy weight.The importance of adequate sleep also discussed. Regular seat belt use and home safety, is also discussed. Changes in health habits are decided on by the patient with goals and time frames  set for achieving them. Immunization and cancer screening needs are specifically addressed at this visit.   Essential hypertension Uncontroled, add amlodipine 5 mg  DASH diet and commitment to daily physical activity for a minimum of 30 minutes discussed and encouraged, as a part of hypertension management. The importance of attaining a healthy weight is also discussed.  BP/Weight 08/01/2017 05/22/2017 05/06/2017 02/18/2017 12/20/2016 12/03/2016 10/29/8117  Systolic BP 147 829 562 130 865 784 696  Diastolic BP 90 91 80 80 79 80 80  Wt. (Lbs) 207 201 201.75 211 209.4 210 210  BMI 33.41 32.44 32.56 34.06 33.8 34.95 34.95       NICOTINE ADDICTION Patient is asked and  confirms current  Nicotine use.  Five to seven minutes of time is spent in counseling the patient of the need to quit smoking  Advice to quit is delivered clearly specifically in reducing the risk of developing heart disease, having a stroke, or of developing all types of cancer, especially lung and oral cancer. Improvement in breathing and exercise tolerance and quality of life is also discussed, as is the economic benefit.  Assessment of willingness to quit or to make an attempt to quit is made and documented  Assistance in quit attempt is made with several and varied options presented, based on patient's desire and need. These include  literature, local classes available, 1800 QUIT NOW number,  OTC and prescription medication.  The GOAL to be NICOTINE FREE is re emphasized.  The patient has set a personal goal of either reduction or discontinuation and follow up is arranged between 6 an 16 weeks.

## 2017-08-02 NOTE — Assessment & Plan Note (Signed)

## 2017-08-05 LAB — POC HEMOCCULT BLD/STL (OFFICE/1-CARD/DIAGNOSTIC): Fecal Occult Blood, POC: NEGATIVE

## 2017-08-05 NOTE — Addendum Note (Signed)
Addended by: Eual Fines on: 08/05/2017 08:21 AM   Modules accepted: Orders

## 2017-09-23 ENCOUNTER — Other Ambulatory Visit: Payer: Self-pay | Admitting: Gastroenterology

## 2017-10-11 DIAGNOSIS — E790 Hyperuricemia without signs of inflammatory arthritis and tophaceous disease: Secondary | ICD-10-CM | POA: Diagnosis not present

## 2017-10-11 DIAGNOSIS — I10 Essential (primary) hypertension: Secondary | ICD-10-CM | POA: Diagnosis not present

## 2017-10-11 DIAGNOSIS — R252 Cramp and spasm: Secondary | ICD-10-CM | POA: Diagnosis not present

## 2017-10-11 DIAGNOSIS — E785 Hyperlipidemia, unspecified: Secondary | ICD-10-CM | POA: Diagnosis not present

## 2017-10-11 DIAGNOSIS — R7303 Prediabetes: Secondary | ICD-10-CM | POA: Diagnosis not present

## 2017-10-11 DIAGNOSIS — E559 Vitamin D deficiency, unspecified: Secondary | ICD-10-CM | POA: Diagnosis not present

## 2017-10-12 LAB — CMP14+EGFR
A/G RATIO: 1.5 (ref 1.2–2.2)
ALBUMIN: 4.3 g/dL (ref 3.5–4.8)
ALT: 8 IU/L (ref 0–32)
AST: 14 IU/L (ref 0–40)
Alkaline Phosphatase: 114 IU/L (ref 39–117)
BILIRUBIN TOTAL: 0.5 mg/dL (ref 0.0–1.2)
BUN / CREAT RATIO: 15 (ref 12–28)
BUN: 15 mg/dL (ref 8–27)
CALCIUM: 9.3 mg/dL (ref 8.7–10.3)
CHLORIDE: 103 mmol/L (ref 96–106)
CO2: 25 mmol/L (ref 20–29)
Creatinine, Ser: 1.03 mg/dL — ABNORMAL HIGH (ref 0.57–1.00)
GFR calc non Af Amer: 53 mL/min/{1.73_m2} — ABNORMAL LOW (ref >59)
GFR, EST AFRICAN AMERICAN: 61 mL/min/{1.73_m2} (ref >59)
GLOBULIN, TOTAL: 2.9 g/dL (ref 1.5–4.5)
Glucose: 125 mg/dL — ABNORMAL HIGH (ref 65–99)
POTASSIUM: 4.5 mmol/L (ref 3.5–5.2)
SODIUM: 144 mmol/L (ref 134–144)
TOTAL PROTEIN: 7.2 g/dL (ref 6.0–8.5)

## 2017-10-12 LAB — LIPID PANEL
CHOL/HDL RATIO: 4.1 ratio (ref 0.0–4.4)
CHOLESTEROL TOTAL: 210 mg/dL — AB (ref 100–199)
HDL: 51 mg/dL (ref >39)
LDL Calculated: 137 mg/dL — ABNORMAL HIGH (ref 0–99)
TRIGLYCERIDES: 112 mg/dL (ref 0–149)
VLDL Cholesterol Cal: 22 mg/dL (ref 5–40)

## 2017-10-12 LAB — HEMOGLOBIN A1C
Est. average glucose Bld gHb Est-mCnc: 126 mg/dL
Hgb A1c MFr Bld: 6 % — ABNORMAL HIGH (ref 4.8–5.6)

## 2017-10-12 LAB — VITAMIN D 25 HYDROXY (VIT D DEFICIENCY, FRACTURES): Vit D, 25-Hydroxy: 19.4 ng/mL — ABNORMAL LOW (ref 30.0–100.0)

## 2017-10-15 ENCOUNTER — Ambulatory Visit (INDEPENDENT_AMBULATORY_CARE_PROVIDER_SITE_OTHER): Payer: Medicare Other | Admitting: Family Medicine

## 2017-10-15 ENCOUNTER — Encounter: Payer: Self-pay | Admitting: Family Medicine

## 2017-10-15 VITALS — BP 138/88 | HR 96 | Resp 16 | Ht 66.0 in | Wt 205.1 lb

## 2017-10-15 DIAGNOSIS — Z1231 Encounter for screening mammogram for malignant neoplasm of breast: Secondary | ICD-10-CM | POA: Diagnosis not present

## 2017-10-15 DIAGNOSIS — F17200 Nicotine dependence, unspecified, uncomplicated: Secondary | ICD-10-CM

## 2017-10-15 DIAGNOSIS — F17210 Nicotine dependence, cigarettes, uncomplicated: Secondary | ICD-10-CM

## 2017-10-15 DIAGNOSIS — E6609 Other obesity due to excess calories: Secondary | ICD-10-CM | POA: Diagnosis not present

## 2017-10-15 DIAGNOSIS — E8881 Metabolic syndrome: Secondary | ICD-10-CM | POA: Diagnosis not present

## 2017-10-15 DIAGNOSIS — I10 Essential (primary) hypertension: Secondary | ICD-10-CM

## 2017-10-15 DIAGNOSIS — E559 Vitamin D deficiency, unspecified: Secondary | ICD-10-CM

## 2017-10-15 DIAGNOSIS — E785 Hyperlipidemia, unspecified: Secondary | ICD-10-CM | POA: Diagnosis not present

## 2017-10-15 DIAGNOSIS — Z6834 Body mass index (BMI) 34.0-34.9, adult: Secondary | ICD-10-CM

## 2017-10-15 DIAGNOSIS — R7303 Prediabetes: Secondary | ICD-10-CM

## 2017-10-15 NOTE — Patient Instructions (Addendum)
Wellness with nurse due in May, please schedule  MD  Follow up in 6 .5 months, call if you need me before  Fasting lipid, cmp and EGFr and HBa1C in 6 months, please call for lab order  Mammogram is due 7/31 or after please schedule   Please  Start once daily vitamin D3 5000 IU , this is low , this should help you cramps  And  Spasm  We will add magnesium level and uric acid level to recent lab and let you know if those are not normal  Please cut back on cheese , and fried foods , and egg  yolk and butter,your cholesterol is too high and this increases your risk of heart disease and stroke  Please cut back on sugar and starchy foods  , or else  You will become a diabetic, and this is [name]

## 2017-10-15 NOTE — Progress Notes (Signed)
[name]     MRN: [id number]      DOB: 06-24-1941   HPI [name] is here for follow up and re-evaluation of chronic medical conditions, medication management and review of any available recent lab and radiology data.  Preventive health is updated, specifically  Cancer screening and Immunization.   Questions or concerns regarding consultations or procedures which the PT has had in the interim are  addressed. The PT denies any adverse reactions to current medications since the last visit.  4 day h/o cramps and charlie horse last week back of both legs and in both big toes   ROS Denies recent fever or chills. Denies sinus pressure, nasal congestion, ear pain or sore throat. Denies chest congestion, productive cough or wheezing. Denies chest pains, palpitations and leg swelling Denies abdominal pain, nausea, vomiting,diarrhea or constipation.   Denies dysuria, frequency, hesitancy or incontinence. Denies joint pain, swelling and limitation in mobility. Denies headaches, seizures, numbness, or tingling. Denies depression, anxiety or insomnia. Denies skin break down or rash.   PE  BP 138/88   Pulse 96   Resp 16   Ht 5\' 6"  (1.676 m)   Wt 205 lb 1.9 oz (93 kg)   SpO2 95%   BMI 33.11 kg/m   Patient alert and oriented and in no cardiopulmonary distress.  HEENT: No facial asymmetry, EOMI,   oropharynx pink and moist.  Neck supple no JVD, no mass.  Chest: Clear to auscultation bilaterally.  CVS: S1, S2 no murmurs, no S3.Regular rate.  ABD: Soft non tender.   Ext: No edema  MS: Adequate ROM spine, shoulders, hips and knees.  Skin: Intact, no ulcerations or rash noted.  Psych: Good eye contact, normal affect. Memory intact not anxious or depressed appearing.  CNS: CN 2-12 intact, power,  normal throughout.no focal deficits noted.   Assessment & Plan Essential hypertension Sub optimal control, needs to work on lifestyle  No med change DASH diet and commitment to  daily physical activity for a minimum of 30 minutes discussed and encouraged, as a part of hypertension management. The importance of attaining a healthy weight is also discussed.  BP/Weight 10/15/2017 08/01/2017 05/22/2017 05/06/2017 02/18/2017 12/20/2016 09/20/6008  Systolic BP 932 355 732 202 542 706 237  Diastolic BP 88 90 91 80 80 79 80  Wt. (Lbs) 205.12 207 201 201.75 211 209.4 210  BMI 33.11 33.41 32.44 32.56 34.06 33.8 34.95       Prediabetes unchanged Patient educated about the importance of limiting  Carbohydrate intake , the need to commit to daily physical activity for a minimum of 30 minutes , and to commit weight loss. The fact that changes in all these areas will reduce or eliminate all together the development of diabetes is stressed.   Diabetic Labs Latest Ref Rng & Units 10/11/2017 05/06/2017 12/03/2016 07/04/2016 02/20/2016  HbA1c 4.8 - 5.6 % 6.0(H) 6.0(H) 6.2(H) 6.0(H) 6.4(H)  Microalbumin 0.00 - 1.89 mg/dL - - - - -  Micro/Creat Ratio 0.0 - 30.0 mg/g - - - - -  Chol 100 - 199 mg/dL 210(H) 191 199 186 -  HDL >39 mg/dL 51 44 50 45(L) -  Calc LDL 0 - 99 mg/dL 137(H) 125(H) 118(H) 118(H) -  Triglycerides 0 - 149 mg/dL 112 108 157(H) 117 -  Creatinine 0.57 - 1.00 mg/dL 1.03(H) 0.95 0.89 0.88 0.86   BP/Weight 10/15/2017 08/01/2017 05/22/2017 05/06/2017 02/18/2017 12/20/2016 01/17/3150  Systolic BP 761 607 371 062 120 157 122  Diastolic BP 88 90 91 80 80 79 80  Wt. (Lbs) 205.12 207 201 201.75 211 209.4 210  BMI 33.11 33.41 32.44 32.56 34.06 33.8 34.95   Foot/eye exam completion dates 05/02/2010  Foot exam Order yes  Foot Form Completion -      Vitamin D deficiency Needs supplementation and will likely address cramps , she is advised of this  NICOTINE ADDICTION Asked : confirms current use Advised : need to quit to improve breathing and reduce risk of heart disease, stroke , cancer and lung disease Assess: not willing to set quit date Arrange:advised 1800 QUITNOW # to  call 24/7 for help Arrange : f/u in 4 months Time spent 5 mins  Obesity Unchanged Patient re-educated about  the importance of commitment to a  minimum of 150 minutes of exercise per week.  The importance of healthy food choices with portion control discussed. Encouraged to start a food diary, count calories and to consider  joining a support group. Sample diet sheets offered. Goals set by the patient for the next several months.   Weight /BMI 10/15/2017 08/01/2017 05/22/2017  WEIGHT 205 lb 1.9 oz 207 lb 201 lb  HEIGHT 5\' 6"  5\' 6"  5\' 6"   BMI 33.11 kg/m2 33.41 kg/m2 32.44 kg/m2      Hyperlipidemia LDL goal <100 Hyperlipidemia:Low fat diet discussed and encouraged.   Lipid Panel  Lab Results  Component Value Date   CHOL 210 (H) 10/11/2017   HDL 51 10/11/2017   LDLCALC 137 (H) 10/11/2017   TRIG 112 10/11/2017   CHOLHDL 4.1 10/11/2017     Uncontrolled , has not been taking medication regularly and needs to lower fat intake  Metabolic syndrome X The increased risk of cardiovascular disease associated with this diagnosis, and the need to consistently work on lifestyle to change this is discussed. Following  a  heart healthy diet ,commitment to 30 minutes of exercise at least 5 days per week, as well as control of blood sugar and cholesterol , and achieving a healthy weight are all the areas to be addressed .

## 2017-10-16 NOTE — Progress Notes (Signed)
 Left message for patient to call back for lab results. Will also mail lab results to patient.   - S.

## 2017-10-17 ENCOUNTER — Telehealth: Payer: Self-pay

## 2017-10-17 ENCOUNTER — Telehealth: Payer: Self-pay | Admitting: Family Medicine

## 2017-10-17 DIAGNOSIS — Z1231 Encounter for screening mammogram for malignant neoplasm of breast: Secondary | ICD-10-CM

## 2017-10-17 NOTE — Telephone Encounter (Signed)
Pt returning your call regarding lab work--please call

## 2017-10-17 NOTE — Telephone Encounter (Signed)
Voicemail left.

## 2017-10-17 NOTE — Telephone Encounter (Signed)
Patient wants 3D mammo instead. Order has been entered already.

## 2017-10-20 ENCOUNTER — Encounter: Payer: Self-pay | Admitting: Family Medicine

## 2017-10-20 NOTE — Assessment & Plan Note (Signed)
Sub optimal control, needs to work on lifestyle  No med change DASH diet and commitment to daily physical activity for a minimum of 30 minutes discussed and encouraged, as a part of hypertension management. The importance of attaining a healthy weight is also discussed.  BP/Weight 10/15/2017 08/01/2017 05/22/2017 05/06/2017 02/18/2017 12/20/2016 7/78/2423  Systolic BP 536 144 315 400 867 619 509  Diastolic BP 88 90 91 80 80 79 80  Wt. (Lbs) 205.12 207 201 201.75 211 209.4 210  BMI 33.11 33.41 32.44 32.56 34.06 33.8 34.95

## 2017-10-20 NOTE — Assessment & Plan Note (Signed)
Needs supplementation and will likely address cramps , she is advised of this

## 2017-10-20 NOTE — Assessment & Plan Note (Signed)
The increased risk of cardiovascular disease associated with this diagnosis, and the need to consistently work on lifestyle to change this is discussed. Following  a  heart healthy diet ,commitment to 30 minutes of exercise at least 5 days per week, as well as control of blood sugar and cholesterol , and achieving a healthy weight are all the areas to be addressed .  

## 2017-10-20 NOTE — Assessment & Plan Note (Signed)
Unchanged Patient re-educated about  the importance of commitment to a  minimum of 150 minutes of exercise per week.  The importance of healthy food choices with portion control discussed. Encouraged to start a food diary, count calories and to consider  joining a support group. Sample diet sheets offered. Goals set by the patient for the next several months.   Weight /BMI 10/15/2017 08/01/2017 05/22/2017  WEIGHT 205 lb 1.9 oz 207 lb 201 lb  HEIGHT 5\' 6"  5\' 6"  5\' 6"   BMI 33.11 kg/m2 33.41 kg/m2 32.44 kg/m2

## 2017-10-20 NOTE — Assessment & Plan Note (Signed)
Hyperlipidemia:Low fat diet discussed and encouraged.   Lipid Panel  Lab Results  Component Value Date   CHOL 210 (H) 10/11/2017   HDL 51 10/11/2017   LDLCALC 137 (H) 10/11/2017   TRIG 112 10/11/2017   CHOLHDL 4.1 10/11/2017     Uncontrolled , has not been taking medication regularly and needs to lower fat intake

## 2017-10-20 NOTE — Assessment & Plan Note (Signed)
unchanged Patient educated about the importance of limiting  Carbohydrate intake , the need to commit to daily physical activity for a minimum of 30 minutes , and to commit weight loss. The fact that changes in all these areas will reduce or eliminate all together the development of diabetes is stressed.   Diabetic Labs Latest Ref Rng & Units 10/11/2017 05/06/2017 12/03/2016 07/04/2016 02/20/2016  HbA1c 4.8 - 5.6 % 6.0(H) 6.0(H) 6.2(H) 6.0(H) 6.4(H)  Microalbumin 0.00 - 1.89 mg/dL - - - - -  Micro/Creat Ratio 0.0 - 30.0 mg/g - - - - -  Chol 100 - 199 mg/dL 210(H) 191 199 186 -  HDL >39 mg/dL 51 44 50 45(L) -  Calc LDL 0 - 99 mg/dL 137(H) 125(H) 118(H) 118(H) -  Triglycerides 0 - 149 mg/dL 112 108 157(H) 117 -  Creatinine 0.57 - 1.00 mg/dL 1.03(H) 0.95 0.89 0.88 0.86   BP/Weight 10/15/2017 08/01/2017 05/22/2017 05/06/2017 02/18/2017 12/20/2016 10/22/270  Systolic BP 536 644 034 742 595 638 756  Diastolic BP 88 90 91 80 80 79 80  Wt. (Lbs) 205.12 207 201 201.75 211 209.4 210  BMI 33.11 33.41 32.44 32.56 34.06 33.8 34.95   Foot/eye exam completion dates 05/02/2010  Foot exam Order yes  Foot Form Completion -

## 2017-10-20 NOTE — Assessment & Plan Note (Signed)
Asked : confirms current use Advised : need to quit to improve breathing and reduce risk of heart disease, stroke , cancer and lung disease Assess: not willing to set quit date Arrange:advised 1800 QUITNOW # to call 24/7 for help Arrange : f/u in 4 months Time spent 5 mins

## 2017-10-28 ENCOUNTER — Telehealth: Payer: Self-pay

## 2017-10-28 NOTE — Telephone Encounter (Signed)
Pt called and requested samples of Linzess 290 mcg. I am leaving #12 at front for her to pick up.

## 2017-10-30 LAB — SPECIMEN STATUS REPORT

## 2017-10-30 LAB — MAGNESIUM: MAGNESIUM: 2.1 mg/dL (ref 1.6–2.3)

## 2017-10-30 LAB — URIC ACID: URIC ACID: 6 mg/dL (ref 2.5–7.1)

## 2017-11-12 ENCOUNTER — Telehealth: Payer: Self-pay

## 2017-11-12 NOTE — Telephone Encounter (Signed)
Thinks she has a sinus infection. Has been going on for a few days and the mucus is greenish  smells very bad and she has a terrible taste in her mouth and states she needs something sent in for it. No fever.

## 2017-11-15 NOTE — Telephone Encounter (Signed)
Advise with no fever, no indication for antibiotic, which can be dangerous. Advise salt water gargles and flush sinuses with saline 3 times daily

## 2017-11-15 NOTE — Telephone Encounter (Signed)
Patient aware.

## 2017-11-19 ENCOUNTER — Ambulatory Visit: Payer: Medicare Other | Admitting: Nurse Practitioner

## 2017-11-19 ENCOUNTER — Encounter: Payer: Self-pay | Admitting: Nurse Practitioner

## 2017-11-19 ENCOUNTER — Other Ambulatory Visit: Payer: Self-pay

## 2017-11-19 VITALS — BP 190/99 | HR 90 | Temp 97.0°F | Ht 66.0 in | Wt 204.6 lb

## 2017-11-19 DIAGNOSIS — R1319 Other dysphagia: Secondary | ICD-10-CM

## 2017-11-19 DIAGNOSIS — K219 Gastro-esophageal reflux disease without esophagitis: Secondary | ICD-10-CM | POA: Diagnosis not present

## 2017-11-19 DIAGNOSIS — K5901 Slow transit constipation: Secondary | ICD-10-CM

## 2017-11-19 NOTE — Assessment & Plan Note (Signed)
Constipation doing well on Linzess.  She has to take it every other day, rather than every day, to prevent diarrhea.  She takes it every other day she has a bowel movement daily which is soft and passes easily.  Recommend she continue Linzess at this time.  Follow-up in 3 months.

## 2017-11-19 NOTE — Assessment & Plan Note (Signed)
GERD well controlled on daily PPI.  I again explained to her the need to be on PPI indefinitely.  She is taking it and has no breakthrough symptoms at this time.  Continue to monitor symptoms, continue PPI, follow-up in 3 months.

## 2017-11-19 NOTE — Progress Notes (Signed)
Referring Provider: Fayrene Helper, MD Primary Care Physician:  Fayrene Helper, MD Primary GI:  Dr. Oneida Alar  Chief Complaint  Patient presents with  . Gastroesophageal Reflux    f/u.  Marland Kitchen Constipation    HPI:   [name] is a 77 y.o. female who presents for follow-up on GERD and constipation.  The patient was last seen in our office 05/22/2017 for the same as well as dysphasia.  Chronic constipation, Amitiza caused cramping.  Started on Linzess.  At her last visit she was doing well overall on Linzess having [place] 4 stools.  GERD well managed on PPI.  No other GI symptoms.  Recommended continue medication, follow-up in 6 months.  At that time it was noted no recurrent dysphagia with well managed GERD.  Last EGD completed 07/07/2014 which found ulcers healed, persistent erosions on aspirin, small hiatal hernia.  Recommended indefinite PPI, avoid triggers.  Most recent esophageal dilation completed 09/11/2013.  Today she states she's doing well overall. 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 Takes Linzess every other day which controls her constipation and helps avert diarrhea. Has some lower esophageal dysphagia symptoms with associated dyspnea. Denies regurgitation, feels like it "lays here [epigastric area]." Denies hematochezia, melena, N/V, fever, chills, unintentional weight loss. Denies chest pain, dyspnea, dizziness, lightheadedness, syncope, near syncope. Denies any other upper or lower GI symptoms.  Past Medical History:  Diagnosis Date  . COPD (chronic obstructive pulmonary disease) ([place])   . Hiatal hernia without gangrene and obstruction 03/23/2015  . Hypertension 2004  . Lung nodule seen on imaging study   . Nicotine addiction   . OA (osteoarthritis) of knee   . Obesity   . Sinusitis     Past Surgical History:  Procedure Laterality Date  .  bilateral cataract surgery  7/27 & 03/01/09   Dr. Gershon Crane    . cholecystectomy    . COLONOSCOPY  04/2004   Dr. Leane Para Smith-->melanosis coli  . COLONOSCOPY WITH ESOPHAGOGASTRODUODENOSCOPY (EGD) N/A 02/25/2013   Dr. Fields:Normal mucosa in the terminal ileum/Severe melanosis throughout the entire examined colon/ONE COLON POLYP REMOVED (tubular adenoma)/Mild diverticulosis  in the sigmoid colon/EGD:The mucosa of the esophagus appeared normal/Non-erosive gastritis, empiric dilation with Savary dilator  . ESOPHAGOGASTRODUODENOSCOPY  11/07/2010   Jenkins:hiatal hernia/no evidence of Barrett esophagitis.  The Z-line was noted to be at 39 cm from the teeth. CLO test negative  . ESOPHAGOGASTRODUODENOSCOPY N/A 09/11/2013   Dr.Fields-  incomplete esophageal web in mid-esophagus, medium sized hialtal hernia, PUD. bx=focal erosion with inflammation and fibrosis  . ESOPHAGOGASTRODUODENOSCOPY N/A 07/06/2014   Procedure: ESOPHAGOGASTRODUODENOSCOPY (EGD);  Surgeon: Danie Binder, MD;  Location: [facility];  Service: Endoscopy;  Laterality: N/A;  . knee arthroscopy right    . LEFT HEART CATHETERIZATION WITH CORONARY ANGIOGRAM N/A 04/08/2014   Procedure: LEFT HEART CATHETERIZATION WITH CORONARY ANGIOGRAM;  Surgeon: Sinclair Grooms, MD;  Location: [facility] CATH LAB;  Service: Cardiovascular;  Laterality: N/A;  . MALONEY DILATION N/A 02/25/2013   Procedure: Venia Minks DILATION;  Surgeon: Danie Binder, MD;  Location: [facility];  Service: Endoscopy;  Laterality: N/A;  . PARTIAL HYSTERECTOMY    . SAVORY DILATION N/A 02/25/2013   Procedure: SAVORY DILATION;  Surgeon: Danie Binder, MD;  Location: [facility];  Service: Endoscopy;  Laterality: N/A;  . total knee arthroplasty right  05/29/2005   Dr.  Aline Brochure     Current Outpatient Medications  Medication Sig Dispense Refill  . albuterol (PROVENTIL HFA;VENTOLIN HFA) 108 (90 BASE) MCG/ACT inhaler Inhale 2-4 puffs into the lungs every 4 (four) hours as needed for wheezing  or shortness of breath. 1 Inhaler 0  . ALOE VERA PO Take 500 mg by mouth daily.    Marland Kitchen amLODipine (NORVASC) 5 MG tablet Take 1 tablet (5 mg total) by mouth daily. 30 tablet 3  . aspirin EC 81 MG tablet Take 81 mg by mouth daily.    . Calcium Carbonate-Vitamin D (CALCIUM + D PO) Take 1 tablet by mouth daily.    . Cholecalciferol 400 units CAPS Take 1 capsule by mouth daily.    . fluticasone (FLONASE) 50 MCG/ACT nasal spray Place 2 sprays into both nostrils daily as needed for allergies or rhinitis. 16 g 5  . hydrochlorothiazide (HYDRODIURIL) 25 MG tablet TAKE 1 TABLET(25 MG) BY MOUTH DAILY 90 tablet 1  . linaclotide (LINZESS) 290 MCG CAPS capsule 1 PO 30 MINS BEFORE BREAKFAST. IT MAY CAUSE DIARRHEA. 30 capsule 11  . montelukast (SINGULAIR) 10 MG tablet Take 1 tablet (10 mg total) by mouth at bedtime. 30 tablet 3  . omeprazole (PRILOSEC) 20 MG capsule TAKE 1 CAPSULE BY MOUTH ONCE DAILY 30 MINUTES PRIOR TO BREAKFAST 30 capsule 3  . pravastatin (PRAVACHOL) 40 MG tablet TAKE 1 TABLET(40 MG) BY MOUTH AT BEDTIME 90 tablet 1  . Red Yeast Rice 600 MG CAPS Take 1 capsule by mouth daily.    . sodium chloride (OCEAN) 0.65 % SOLN nasal spray Place 1 spray into both nostrils as needed for congestion.    . vitamin C (ASCORBIC ACID) 500 MG tablet Take 500 mg by mouth daily.     No current facility-administered medications for this visit.     Allergies as of 11/19/2017  . (No Known Allergies)    Family History  Problem Relation Age of Onset  . Diabetes Mother   . Hypertension Mother   . Coronary artery disease Mother   . Pneumonia Father   . Hypertension Sister   . Hypertension Sister   . Hypertension Brother        Psychologist, forensic  . Diabetes Brother   . Hypertension Brother   . Colon cancer Neg Hx   . Liver disease Neg Hx     Social History   Socioeconomic History  . Marital status: Widowed    Spouse name: Not on file  . Number of children: 4  . Years of education: Not on file  . Highest  education level: Not on file  Occupational History  . Occupation: employed     Fish farm manager: RETIRED  Social Needs  . Financial resource strain: Not on file  . Food insecurity:    Worry: Not on file    Inability: Not on file  . Transportation needs:    Medical: Not on file    Non-medical: Not on file  Tobacco Use  . Smoking status: Current Some Day Smoker    Packs/day: 0.10    Years: 10.00    Pack years: 1.00    Types: Cigarettes    Start date: 07/23/1962  . Smokeless tobacco: Never Used  . Tobacco comment: 3 per day   Substance and Sexual Activity  . Alcohol use: No    Alcohol/week: 0.0 oz  . Drug use: No  . Sexual activity: Never  Lifestyle  . Physical activity:    Days per week: Not on  file    Minutes per session: Not on file  . Stress: Not on file  Relationships  . Social connections:    Talks on phone: Not on file    Gets together: Not on file    Attends religious service: Not on file    Active member of club or organization: Not on file    Attends meetings of clubs or organizations: Not on file    Relationship status: Not on file  Other Topics Concern  . Not on file  Social History Narrative  . Not on file    Review of Systems: Complete ROS negative except as per HPI.  Physical Exam: BP (!) 190/99   Pulse 90   Temp (!) 97 F (36.1 C) (Oral)   Ht 5\' 6"  (1.676 m)   Wt 204 lb 9.6 oz (92.8 kg)   BMI 33.02 kg/m  General:   Alert and oriented. Pleasant and cooperative. Well-nourished and well-developed.  Eyes:  Without icterus, sclera clear and conjunctiva pink.  Ears:  Normal auditory acuity. Cardiovascular:  S1, S2 present without murmurs appreciated. Extremities without clubbing or edema. Respiratory:  Clear to auscultation bilaterally. No wheezes, rales, or rhonchi. No distress.  Gastrointestinal:  +BS, soft, non-tender and non-distended. No HSM noted. No guarding or rebound. No masses appreciated.  Rectal:  Deferred  Musculoskalatal:  Symmetrical without  gross deformities. Neurologic:  Alert and oriented x4;  grossly normal neurologically. Psych:  Alert and cooperative. Normal mood and affect. Heme/Lymph/Immune: No excessive bruising noted.    11/19/2017 10:14 AM   Disclaimer: This note was dictated with voice recognition software. Similar sounding words can inadvertently be transcribed and may not be corrected upon review.

## 2017-11-19 NOTE — Progress Notes (Signed)
cc'ed to pcp °

## 2017-11-19 NOTE — Assessment & Plan Note (Signed)
Vague complaints of dysphasia.  States it feels like anything she eats or drinks "sits right here" and indicates her epigastric area.  Denies regurgitation.  When things "get stuck" it causes some dyspnea and she has to sit down.  She is previously had an esophageal web status post dilation in 2015.  At this point I will check a barium pill esophagram to further evaluate.  She does have a known small hiatal hernia as well.  She states she was previously told they could not do surgery for her hiatal hernia because of its location and proximity to her heart.  We will get results from barium pill esophagram and make further decisions.  Follow-up in 3 months.

## 2017-11-19 NOTE — Patient Instructions (Signed)
1. Continue taking your current medications. 2. We will help schedule your swallowing test for you. 3. We will call you with the results when they are available to [place]. 4. Follow-up in 3 months. 5. Call us if you have any questions or concerns.   At [facility] we value your feedback. You may receive a survey about your visit today. Please share your experience as we strive to create trusting relationships with our patients to provide genuine, compassionate, quality care.  It was great to see you today!  I hope you have a wonderful summer and get some time to spend in your garden!!

## 2017-11-21 ENCOUNTER — Ambulatory Visit (HOSPITAL_COMMUNITY)
Admission: RE | Admit: 2017-11-21 | Discharge: 2017-11-21 | Disposition: A | Discharge status: Home / Self Care | Payer: Medicare Other | Source: Ambulatory Visit | Attending: Nurse Practitioner | Admitting: Nurse Practitioner

## 2017-11-21 DIAGNOSIS — R1319 Other dysphagia: Secondary | ICD-10-CM | POA: Diagnosis not present

## 2017-11-21 DIAGNOSIS — Q394 Esophageal web: Secondary | ICD-10-CM | POA: Diagnosis not present

## 2017-11-21 DIAGNOSIS — R1310 Dysphagia, unspecified: Secondary | ICD-10-CM | POA: Diagnosis present

## 2017-11-21 DIAGNOSIS — K449 Diaphragmatic hernia without obstruction or gangrene: Secondary | ICD-10-CM | POA: Diagnosis not present

## 2017-12-11 NOTE — Progress Notes (Signed)
PT is aware.

## 2018-01-20 ENCOUNTER — Ambulatory Visit: Payer: Medicare Other

## 2018-02-18 ENCOUNTER — Ambulatory Visit: Payer: Medicare Other | Admitting: Nurse Practitioner

## 2018-02-18 ENCOUNTER — Telehealth: Payer: Self-pay | Admitting: Gastroenterology

## 2018-02-18 ENCOUNTER — Encounter: Payer: Self-pay | Admitting: Gastroenterology

## 2018-02-18 NOTE — Progress Notes (Deleted)
Referring Provider: Fayrene Helper, MD Primary Care Physician:  Fayrene Helper, MD Primary GI:  Dr. Oneida Alar  No chief complaint on file.   HPI:   [name] is a 77 y.o. female who presents for follow-up on GERD and constipation.  The patient was last seen in our office 11/19/2017 for the same as well as dysphasia.  Chronic history of constipation, previous tried and failed Amitiza due to adverse effects.  Did better on Linzess.  EGD completed 07/07/2014 with healed ulcers but persistent erosions on aspirin, small hiatal hernia.  Recommended indefinite PPI and avoid triggers.  Most recent esophageal dilation to 09/11/2013.  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  GERD doing well on PPI.  Linzess every other day which controls her constipation and prevents diarrhea.  Lower esophageal dysphasia symptoms associated with dyspnea but denies regurgitation. Recommended BPE, continue current medications, follow-up in 3 months.  BPE completed 11/21/2017 which found tiny non-obstructing cervical esophageal web, otherwise unremarkable. Barium pill passed well.  Today she states   Past Medical History:  Diagnosis Date  . COPD (chronic obstructive pulmonary disease) ([place])   . Hiatal hernia without gangrene and obstruction 03/23/2015  . Hypertension 2004  . Lung nodule seen on imaging study   . Nicotine addiction   . OA (osteoarthritis) of knee   . Obesity   . Sinusitis     Past Surgical History:  Procedure Laterality Date  . bilateral cataract surgery  7/27 & 03/01/09   Dr. Gershon Crane    . cholecystectomy    . COLONOSCOPY  04/2004   Dr. Leane Para Smith-->melanosis coli  . COLONOSCOPY WITH ESOPHAGOGASTRODUODENOSCOPY (EGD) N/A 02/25/2013   Dr. Fields:Normal mucosa in the terminal ileum/Severe melanosis throughout the entire examined colon/ONE COLON POLYP REMOVED (tubular adenoma)/Mild diverticulosis  in the sigmoid colon/EGD:The  mucosa of the esophagus appeared normal/Non-erosive gastritis, empiric dilation with Savary dilator  . ESOPHAGOGASTRODUODENOSCOPY  11/07/2010   Jenkins:hiatal hernia/no evidence of Barrett esophagitis.  The Z-line was noted to be at 39 cm from the teeth. CLO test negative  . ESOPHAGOGASTRODUODENOSCOPY N/A 09/11/2013   Dr.Fields-  incomplete esophageal web in mid-esophagus, medium sized hialtal hernia, PUD. bx=focal erosion with inflammation and fibrosis  . ESOPHAGOGASTRODUODENOSCOPY N/A 07/06/2014   Procedure: ESOPHAGOGASTRODUODENOSCOPY (EGD);  Surgeon: Danie Binder, MD;  [facility];  Service: Endoscopy;  Laterality: N/A;  . knee arthroscopy right    . LEFT HEART CATHETERIZATION WITH CORONARY ANGIOGRAM N/A 04/08/2014   Procedure: LEFT HEART CATHETERIZATION WITH CORONARY ANGIOGRAM;  Surgeon: Sinclair Grooms, MD;  Location: [facility] CATH LAB;  Service: Cardiovascular;  Laterality: N/A;  . MALONEY DILATION N/A 02/25/2013   Procedure: Venia Minks DILATION;  Surgeon: Danie Binder, MD;  Location: [facility];  Service: Endoscopy;  Laterality: N/A;  . PARTIAL HYSTERECTOMY    . SAVORY DILATION N/A 02/25/2013   Procedure: SAVORY DILATION;  Surgeon: Danie Binder, MD;  Location: [facility];  Service: Endoscopy;  Laterality: N/A;  . total knee arthroplasty right  05/29/2005   Dr. Aline Brochure     Current Outpatient Medications  Medication Sig Dispense Refill  . albuterol (PROVENTIL HFA;VENTOLIN HFA) 108 (90 BASE) MCG/ACT inhaler Inhale 2-4 puffs into the lungs every 4 (four) hours as needed for wheezing or shortness of breath. 1 Inhaler 0  . ALOE VERA PO Take 500 mg by mouth daily.    Marland Kitchen amLODipine (NORVASC) 5 MG  tablet Take 1 tablet (5 mg total) by mouth daily. 30 tablet 3  . aspirin EC 81 MG tablet Take 81 mg by mouth daily.    . Calcium Carbonate-Vitamin D (CALCIUM + D PO) Take 1 tablet by mouth daily.    . Cholecalciferol 400 units CAPS Take 1 capsule by mouth daily.    . fluticasone (FLONASE)  50 MCG/ACT nasal spray Place 2 sprays into both nostrils daily as needed for allergies or rhinitis. 16 g 5  . hydrochlorothiazide (HYDRODIURIL) 25 MG tablet TAKE 1 TABLET(25 MG) BY MOUTH DAILY 90 tablet 1  . linaclotide (LINZESS) 290 MCG CAPS capsule 1 PO 30 MINS BEFORE BREAKFAST. IT MAY CAUSE DIARRHEA. 30 capsule 11  . montelukast (SINGULAIR) 10 MG tablet Take 1 tablet (10 mg total) by mouth at bedtime. 30 tablet 3  . omeprazole (PRILOSEC) 20 MG capsule TAKE 1 CAPSULE BY MOUTH ONCE DAILY 30 MINUTES PRIOR TO BREAKFAST 30 capsule 3  . pravastatin (PRAVACHOL) 40 MG tablet TAKE 1 TABLET(40 MG) BY MOUTH AT BEDTIME 90 tablet 1  . Red Yeast Rice 600 MG CAPS Take 1 capsule by mouth daily.    . sodium chloride (OCEAN) 0.65 % SOLN nasal spray Place 1 spray into both nostrils as needed for congestion.    . vitamin C (ASCORBIC ACID) 500 MG tablet Take 500 mg by mouth daily.     No current facility-administered medications for this visit.     Allergies as of 02/18/2018  . (No Known Allergies)    Family History  Problem Relation Age of Onset  . Diabetes Mother   . Hypertension Mother   . Coronary artery disease Mother   . Pneumonia Father   . Hypertension Sister   . Hypertension Sister   . Hypertension Brother        Psychologist, forensic  . Diabetes Brother   . Hypertension Brother   . Colon cancer Neg Hx   . Liver disease Neg Hx     Social History   Socioeconomic History  . Marital status: Widowed    Spouse name: Not on file  . Number of children: 4  . Years of education: Not on file  . Highest education level: Not on file  Occupational History  . Occupation: employed     Fish farm manager: RETIRED  Social Needs  . Financial resource strain: Not on file  . Food insecurity:    Worry: Not on file    Inability: Not on file  . Transportation needs:    Medical: Not on file    Non-medical: Not on file  Tobacco Use  . Smoking status: Current Some Day Smoker    Packs/day: 0.10    Years: 10.00     Pack years: 1.00    Types: Cigarettes    Start date: 07/23/1962  . Smokeless tobacco: Never Used  . Tobacco comment: 3 per day   Substance and Sexual Activity  . Alcohol use: No    Alcohol/week: 0.0 oz  . Drug use: No  . Sexual activity: Never  Lifestyle  . Physical activity:    Days per week: Not on file    Minutes per session: Not on file  . Stress: Not on file  Relationships  . Social connections:    Talks on phone: Not on file    Gets together: Not on file    Attends religious service: Not on file    Active member of club or organization: Not on file    Attends  meetings of clubs or organizations: Not on file    Relationship status: Not on file  Other Topics Concern  . Not on file  Social History Narrative  . Not on file    Review of Systems: General: Negative for anorexia, weight loss, fever, chills, fatigue, weakness. Eyes: Negative for vision changes.  ENT: Negative for hoarseness, difficulty swallowing , nasal congestion. CV: Negative for chest pain, angina, palpitations, dyspnea on exertion, peripheral edema.  Respiratory: Negative for dyspnea at rest, dyspnea on exertion, cough, sputum, wheezing.  GI: See history of present illness. GU:  Negative for dysuria, hematuria, urinary incontinence, urinary frequency, nocturnal urination.  MS: Negative for joint pain, low back pain.  Derm: Negative for rash or itching.  Neuro: Negative for weakness, abnormal sensation, seizure, frequent headaches, memory loss, confusion.  Psych: Negative for anxiety, depression, suicidal ideation, hallucinations.  Endo: Negative for unusual weight change.  Heme: Negative for bruising or bleeding. Allergy: Negative for rash or hives.   Physical Exam: There were no vitals taken for this visit. General:   Alert and oriented. Pleasant and cooperative. Well-nourished and well-developed.  Head:  Normocephalic and atraumatic. Eyes:  Without icterus, sclera clear and conjunctiva pink.  Ears:   Normal auditory acuity. Mouth:  No deformity or lesions, oral mucosa pink.  Throat/Neck:  Supple, without mass or thyromegaly. Cardiovascular:  S1, S2 present without murmurs appreciated. Normal pulses noted. Extremities without clubbing or edema. Respiratory:  Clear to auscultation bilaterally. No wheezes, rales, or rhonchi. No distress.  Gastrointestinal:  +BS, soft, non-tender and non-distended. No HSM noted. No guarding or rebound. No masses appreciated.  Rectal:  Deferred  Musculoskalatal:  Symmetrical without gross deformities. Normal posture. Skin:  Intact without significant lesions or rashes. Neurologic:  Alert and oriented x4;  grossly normal neurologically. Psych:  Alert and cooperative. Normal mood and affect. Heme/Lymph/Immune: No significant cervical adenopathy. No excessive bruising noted.    02/18/2018 8:00 AM   Disclaimer: This note was dictated with voice recognition software. Similar sounding words can inadvertently be transcribed and may not be corrected upon review.

## 2018-02-18 NOTE — Telephone Encounter (Signed)
PATIENT WAS A NO SHOW AND LETTER SENT  °

## 2018-02-19 ENCOUNTER — Ambulatory Visit (HOSPITAL_COMMUNITY)
Admission: RE | Admit: 2018-02-19 | Discharge: 2018-02-19 | Disposition: A | Discharge status: Home / Self Care | Payer: Medicare Other | Source: Ambulatory Visit | Attending: Family Medicine | Admitting: Family Medicine

## 2018-02-19 ENCOUNTER — Other Ambulatory Visit: Payer: Self-pay | Admitting: Family Medicine

## 2018-02-19 ENCOUNTER — Ambulatory Visit (HOSPITAL_COMMUNITY): Payer: Medicare Other

## 2018-02-19 DIAGNOSIS — Z1231 Encounter for screening mammogram for malignant neoplasm of breast: Secondary | ICD-10-CM | POA: Insufficient documentation

## 2018-02-24 ENCOUNTER — Other Ambulatory Visit: Payer: Self-pay | Admitting: Family Medicine

## 2018-02-24 ENCOUNTER — Telehealth: Payer: Self-pay

## 2018-02-24 ENCOUNTER — Ambulatory Visit (INDEPENDENT_AMBULATORY_CARE_PROVIDER_SITE_OTHER): Payer: Medicare Other

## 2018-02-24 VITALS — BP 130/82 | HR 94 | Resp 16 | Ht 66.0 in | Wt 203.0 lb

## 2018-02-24 DIAGNOSIS — Z780 Asymptomatic menopausal state: Secondary | ICD-10-CM | POA: Diagnosis not present

## 2018-02-24 DIAGNOSIS — Z0000 Encounter for general adult medical examination without abnormal findings: Secondary | ICD-10-CM | POA: Diagnosis not present

## 2018-02-24 MED ORDER — ALBUTEROL SULFATE HFA 108 (90 BASE) MCG/ACT IN AERS: 2.0000 | INHALATION_SPRAY | Freq: Four times a day (QID) | RESPIRATORY_TRACT | 1 refills | Status: DC | PRN

## 2018-02-24 NOTE — Telephone Encounter (Signed)
Wants a refill of the proventil inhaler that she was given at the hospital a few years ago. States she only uses it when needed

## 2018-02-24 NOTE — Progress Notes (Signed)
Subjective:   [name] is a 77 y.o. female who presents for Medicare Annual (Subsequent) preventive examination.  Review of Systems:   Cardiac Risk Factors include: advanced age (>34men, >65 women);dyslipidemia;hypertension;obesity (BMI >30kg/m2);smoking/ tobacco exposure     Objective:     Vitals: BP 130/82   Pulse 94   Resp 16   Ht 5\' 6"  (1.676 m)   Wt 203 lb (92.1 kg)   SpO2 97%   BMI 32.77 kg/m   Body mass index is 32.77 kg/m.  Advanced Directives 02/24/2018 12/03/2016 02/20/2016 07/06/2014 04/08/2014 09/11/2013 02/25/2013  Does Patient Have a Medical Advance Directive? No No No No No Patient does not have advance directive;Patient would not like information Patient has advance directive, copy not in chart  Type of Advance Directive - - - - - - Living will;Healthcare Power of Attorney  Does patient want to make changes to medical advance directive? Yes (ED - Information included in AVS) - - - - - -  Copy of Healthcare Power of Attorney in Chart? - - - - - - Copy requested from family  Would patient like information on creating a medical advance directive? - Yes (MAU/Ambulatory/Procedural Areas - Information given) Yes - Educational materials given No - patient declined information No - patient declined information - -  Pre-existing out of facility DNR order (yellow form or pink MOST form) - - - - - No No    Tobacco Social History   Tobacco Use  Smoking Status Current Some Day Smoker  . Packs/day: 0.10  . Years: 10.00  . Pack years: 1.00  . Types: Cigarettes  . Start date: 07/23/1962  Smokeless Tobacco Never Used  Tobacco Comment   3 per day      Ready to quit: Not Answered Counseling given: Not Answered Comment: 3 per day    Clinical Intake:  Pre-visit preparation completed: Yes  Pain : No/denies pain Pain Score: 0-No pain     Nutritional Status: BMI > 30  Obese Diabetes: No  How often do you need to have someone help you when you read instructions,  pamphlets, or other written materials from your doctor or pharmacy?: 1 - Never What is the last grade level you completed in school?: 11th grade  Interpreter Needed?: No  Information entered by :: brandi hudy LPN   Past Medical History:  Diagnosis Date  . COPD (chronic obstructive pulmonary disease) ([place])   . Hiatal hernia without gangrene and obstruction 03/23/2015  . Hypertension 2004  . Lung nodule seen on imaging study   . Nicotine addiction   . OA (osteoarthritis) of knee   . Obesity   . Sinusitis    Past Surgical History:  Procedure Laterality Date  . bilateral cataract surgery  7/27 & 03/01/09   Dr. Gershon Crane    . cholecystectomy    . COLONOSCOPY  04/2004   Dr. Leane Para Smith-->melanosis coli  . COLONOSCOPY WITH ESOPHAGOGASTRODUODENOSCOPY (EGD) N/A 02/25/2013   Dr. Fields:Normal mucosa in the terminal ileum/Severe melanosis throughout the entire examined colon/ONE COLON POLYP REMOVED (tubular adenoma)/Mild diverticulosis  in the sigmoid colon/EGD:The mucosa of the esophagus appeared normal/Non-erosive gastritis, empiric dilation with Savary dilator  . ESOPHAGOGASTRODUODENOSCOPY  11/07/2010   Jenkins:hiatal hernia/no evidence of Barrett esophagitis.  The Z-line was noted to be at 39 cm from the teeth. CLO test negative  . ESOPHAGOGASTRODUODENOSCOPY N/A 09/11/2013   Dr.Fields-  incomplete esophageal web in mid-esophagus, medium sized hialtal hernia, PUD. bx=focal erosion with inflammation and fibrosis  .  ESOPHAGOGASTRODUODENOSCOPY N/A 07/06/2014   Procedure: ESOPHAGOGASTRODUODENOSCOPY (EGD);  Surgeon: Danie Binder, MD;  [facility];  Service: Endoscopy;  Laterality: N/A;  . knee arthroscopy right    . LEFT HEART CATHETERIZATION WITH CORONARY ANGIOGRAM N/A 04/08/2014   Procedure: LEFT HEART CATHETERIZATION WITH CORONARY ANGIOGRAM;  Surgeon: Sinclair Grooms, MD;  Location: [facility] CATH LAB;  Service: Cardiovascular;  Laterality: N/A;  . MALONEY DILATION N/A 02/25/2013    Procedure: Venia Minks DILATION;  Surgeon: Danie Binder, MD;  [facility];  Service: Endoscopy;  Laterality: N/A;  . PARTIAL HYSTERECTOMY    . SAVORY DILATION N/A 02/25/2013   Procedure: SAVORY DILATION;  Surgeon: Danie Binder, MD;  [facility]: [facility];  Service: Endoscopy;  Laterality: N/A;  . total knee arthroplasty right  05/29/2005   Dr. Aline Brochure    Family History  Problem Relation Age of Onset  . Diabetes Mother   . Hypertension Mother   . Coronary artery disease Mother   . Pneumonia Father   . Hypertension Sister   . Hypertension Sister   . Hypertension Brother        Psychologist, forensic  . Diabetes Brother   . Hypertension Brother   . Colon cancer Neg Hx   . Liver disease Neg Hx    Social History   Socioeconomic History  . Marital status: Widowed    Spouse name: Not on file  . Number of children: 4  . Years of education: Not on file  . Highest education level: Not on file  Occupational History  . Occupation: employed     Fish farm manager: RETIRED  Social Needs  . Financial resource strain: Not very hard  . Food insecurity:    Worry: Sometimes true    Inability: Sometimes true  . Transportation needs:    Medical: No    Non-medical: No  Tobacco Use  . Smoking status: Current Some Day Smoker    Packs/day: 0.10    Years: 10.00    Pack years: 1.00    Types: Cigarettes    Start date: 07/23/1962  . Smokeless tobacco: Never Used  . Tobacco comment: 3 per day   Substance and Sexual Activity  . Alcohol use: No    Alcohol/week: 0.0 oz  . Drug use: No  . Sexual activity: Never  Lifestyle  . Physical activity:    Days per week: 0 days    Minutes per session: 0 min  . Stress: Only a little  Relationships  . Social connections:    Talks on phone: More than three times a week    Gets together: Twice a week    Attends religious service: More than 4 times per year    Active member of club or organization: Not on file    Attends meetings of clubs or organizations: More  than 4 times per year    Relationship status: Widowed  Other Topics Concern  . Not on file  Social History Narrative  . Not on file    Outpatient Encounter Medications as of 02/24/2018  Medication Sig  . albuterol (PROVENTIL HFA;VENTOLIN HFA) 108 (90 BASE) MCG/ACT inhaler Inhale 2-4 puffs into the lungs every 4 (four) hours as needed for wheezing or shortness of breath.  . ALOE VERA PO Take 500 mg by mouth daily.  Marland Kitchen amLODipine (NORVASC) 5 MG tablet Take 1 tablet (5 mg total) by mouth daily.  Marland Kitchen aspirin EC 81 MG tablet Take 81 mg by mouth daily.  Marland Kitchen  Calcium Carbonate-Vitamin D (CALCIUM + D PO) Take 1 tablet by mouth daily.  . Cholecalciferol 400 units CAPS Take 1 capsule by mouth daily.  . fluticasone (FLONASE) 50 MCG/ACT nasal spray Place 2 sprays into both nostrils daily as needed for allergies or rhinitis.  . hydrochlorothiazide (HYDRODIURIL) 25 MG tablet TAKE 1 TABLET(25 MG) BY MOUTH DAILY  . linaclotide (LINZESS) 290 MCG CAPS capsule 1 PO 30 MINS BEFORE BREAKFAST. IT MAY CAUSE DIARRHEA.  Marland Kitchen montelukast (SINGULAIR) 10 MG tablet Take 1 tablet (10 mg total) by mouth at bedtime.  Marland Kitchen omeprazole (PRILOSEC) 20 MG capsule TAKE 1 CAPSULE BY MOUTH ONCE DAILY 30 MINUTES PRIOR TO BREAKFAST  . pravastatin (PRAVACHOL) 40 MG tablet TAKE 1 TABLET(40 MG) BY MOUTH AT BEDTIME  . Red Yeast Rice 600 MG CAPS Take 1 capsule by mouth daily.  . sodium chloride (OCEAN) 0.65 % SOLN nasal spray Place 1 spray into both nostrils as needed for congestion.  . vitamin C (ASCORBIC ACID) 500 MG tablet Take 500 mg by mouth daily.   No facility-administered encounter medications on file as of 02/24/2018.     Activities of Daily Living In your present state of health, do you have any difficulty performing the following activities: 02/24/2018  Hearing? N  Vision? N  Difficulty concentrating or making decisions? N  Walking or climbing stairs? N  Dressing or bathing? N  Doing errands, shopping? N  Preparing Food and eating ?  N  Using the Toilet? N  In the past six months, have you accidently leaked urine? N  Do you have problems with loss of bowel control? N  Managing your Medications? N  Managing your Finances? N  Housekeeping or managing your Housekeeping? N  Some recent data might be hidden    Patient Care Team: Fayrene Helper, MD as PCP - General Herminio Commons, MD as Attending Physician (Cardiology) Danie Binder, MD as Consulting Physician (Gastroenterology)    Assessment:   This is a routine wellness examination for [facility].  Exercise Activities and Dietary recommendations Current Exercise Habits: The patient does not participate in regular exercise at present  Goals    . Increase physical activity     Try walking at least 3 days per week for 20-30 mins at a time        Fall Risk Fall Risk  02/24/2018 10/15/2017 08/01/2017 02/18/2017 12/03/2016  Falls in the past year? No No No No No   Is the patient's home free of loose throw rugs in walkways, pet beds, electrical cords, etc?   yes      Grab bars in the bathroom? yes      Handrails on the stairs?   yes      Adequate lighting?   yes  Timed Get Up and Go performed:   Depression Screen PHQ 2/9 Scores 02/24/2018 02/18/2017 12/03/2016 02/20/2016  PHQ - 2 Score 0 0 0 0  PHQ- 9 Score - - - -     Cognitive Function     6CIT Screen 02/24/2018 12/03/2016  What Year? 0 points 0 points  What month? 0 points 0 points  What time? 0 points 0 points  Count back from 20 2 points 0 points  Months in reverse 2 points 0 points  Repeat phrase 2 points 0 points  Total Score 6 0    Immunization History  Administered Date(s) Administered  . H1N1 06/21/2008  . Influenza Split 05/20/2012  . Influenza Whole 05/07/2007, 04/19/2008, 05/09/2010, 03/30/2011  .  Influenza,inj,Quad PF,6+ Mos 04/29/2013, 06/15/2014, 05/24/2015, 06/11/2016, 05/06/2017  . Pneumococcal Conjugate-13 02/16/2014  . Pneumococcal Polysaccharide-23 04/19/2008  . Td 07/24/1995,  03/17/2009    Qualifies for Shingles Vaccine? Will ask insurance if covered   Screening Tests Health Maintenance  Topic Date Due  . INFLUENZA VACCINE  02/20/2018  . TETANUS/TDAP  03/18/2019  . COLONOSCOPY  02/26/2023  . DEXA SCAN  Completed  . PNA vac Low Risk Adult  Completed    Cancer Screenings: Lung: Low Dose CT Chest recommended if Age 29-80 years, 30 pack-year currently smoking OR have quit w/in 15years. Patient does not qualify. Breast:  Up to date on Mammogram? Yes   Up to date of Bone Density/Dexa? Yes Colorectal: done   Additional Screenings: Hepatitis C Screening:      Plan:      I have personally reviewed and noted the following in the patient's chart:   . Medical and social history . Use of alcohol, tobacco or illicit drugs  . Current medications and supplements . Functional ability and status . Nutritional status . Physical activity . Advanced directives . List of other physicians . Hospitalizations, surgeries, and ER visits in previous 12 months . Vitals . Screenings to include cognitive, depression, and falls . Referrals and appointments  In addition, I have reviewed and discussed with patient certain preventive protocols, quality metrics, and best practice recommendations. A written personalized care plan for preventive services as well as general preventive health recommendations were provided to patient.     Kate Sable, LPN, LPN  01/28/9380

## 2018-02-24 NOTE — Patient Instructions (Addendum)
[name] , Thank you for taking time to come for your Medicare Wellness Visit. I appreciate your ongoing commitment to your health goals. Please review the following plan we discussed and let me know if I can assist you in the future.   Schedule bone density at checkout [phone])  Next appt 04/28/18 at 8:00am  Ask insurance if shingrix is covered      Screening recommendations/referrals: Colonoscopy: done  Mammogram: done  Bone Density: will refer  Recommended yearly ophthalmology/optometry visit for glaucoma screening and checkup Recommended yearly dental visit for hygiene and checkup  Vaccinations: Influenza vaccine: due in early Sept  Pneumococcal vaccine: done  Tdap vaccine: done  Shingles vaccine: ask insurance if shingrix covered     Advanced directives: done   Conditions/risks identified: done   Next appointment: scheduled    Preventive Care 31 Years and Older, Female Preventive care refers to lifestyle choices and visits with your health care provider that can promote health and wellness. What does preventive care include?  A yearly physical exam. This is also called an annual well check.  Dental exams once or twice a year.  Routine eye exams. Ask your health care provider how often you should have your eyes checked.  Personal lifestyle choices, including:  Daily care of your teeth and gums.  Regular physical activity.  Eating a healthy diet.  Avoiding tobacco and drug use.  Limiting alcohol use.  Practicing safe sex.  Taking low-dose aspirin every day.  Taking vitamin and mineral supplements as recommended by your health care provider. What happens during an annual well check? The services and screenings done by your health care provider during your annual well check will depend on your age, overall health, lifestyle risk factors, and family history of disease. Counseling  Your health care provider may ask you questions about  your:  Alcohol use.  Tobacco use.  Drug use.  Emotional well-being.  Home and relationship well-being.  Sexual activity.  Eating habits.  History of falls.  Memory and ability to understand (cognition).  Work and work Statistician.  Reproductive health. Screening  You may have the following tests or measurements:  Height, weight, and BMI.  Blood pressure.  Lipid and cholesterol levels. These may be checked every 5 years, or more frequently if you are over 72 years old.  Skin check.  Lung cancer screening. You may have this screening every year starting at age 75 if you have a 30-pack-year history of smoking and currently smoke or have quit within the past 15 years.  Fecal occult blood test (FOBT) of the stool. You may have this test every year starting at age 75.  Flexible sigmoidoscopy or colonoscopy. You may have a sigmoidoscopy every 5 years or a colonoscopy every 10 years starting at age 75.  Hepatitis C blood test.  Hepatitis B blood test.  Sexually transmitted disease (STD) testing.  Diabetes screening. This is done by checking your blood sugar (glucose) after you have not eaten for a while (fasting). You may have this done every 1-3 years.  Bone density scan. This is done to screen for osteoporosis. You may have this done starting at age 75.  Mammogram. This may be done every 1-2 years. Talk to your health care provider about how often you should have regular mammograms. Talk with your health care provider about your test results, treatment options, and if necessary, the need for more tests. Vaccines  Your health care provider may recommend certain vaccines, such as:  Influenza vaccine. This is recommended every year.  Tetanus, diphtheria, and acellular pertussis (Tdap, Td) vaccine. You may need a Td booster every 10 years.  Zoster vaccine. You may need this after age 73.  Pneumococcal 13-valent conjugate (PCV13) vaccine. One dose is recommended  after age 75.  Pneumococcal polysaccharide (PPSV23) vaccine. One dose is recommended after age 75. Talk to your health care provider about which screenings and vaccines you need and how often you need them. This information is not intended to replace advice given to you by your health care provider. Make sure you discuss any questions you have with your health care provider. Document Released: 08/05/2015 Document Revised: 03/28/2016 Document Reviewed: 05/10/2015 Elsevier Interactive Patient Education  2017 [employer] Prevention in the Home Falls can cause injuries. They can happen to people of all ages. There are many things you can do to make your home safe and to help prevent falls. What can I do on the outside of my home?  Regularly fix the edges of walkways and driveways and fix any cracks.  Remove anything that might make you trip as you walk through a door, such as a raised step or threshold.  Trim any bushes or trees on the path to your home.  Use bright outdoor lighting.  Clear any walking paths of anything that might make someone trip, such as rocks or tools.  Regularly check to see if handrails are loose or broken. Make sure that both sides of any steps have handrails.  Any raised decks and porches should have guardrails on the edges.  Have any leaves, snow, or ice cleared regularly.  Use sand or salt on walking paths during winter.  Clean up any spills in your garage right away. This includes oil or grease spills. What can I do in the bathroom?  Use night lights.  Install grab bars by the toilet and in the tub and shower. Do not use towel bars as grab bars.  Use non-skid mats or decals in the tub or shower.  If you need to sit down in the shower, use a plastic, non-slip stool.  Keep the floor dry. Clean up any water that spills on the floor as soon as it happens.  Remove soap buildup in the tub or shower regularly.  Attach bath mats securely with  double-sided non-slip rug tape.  Do not have throw rugs and other things on the floor that can make you trip. What can I do in the bedroom?  Use night lights.  Make sure that you have a light by your bed that is easy to reach.  Do not use any sheets or blankets that are too big for your bed. They should not hang down onto the floor.  Have a firm chair that has side arms. You can use this for support while you get dressed.  Do not have throw rugs and other things on the floor that can make you trip. What can I do in the kitchen?  Clean up any spills right away.  Avoid walking on wet floors.  Keep items that you use a lot in easy-to-reach places.  If you need to reach something above you, use a strong step stool that has a grab bar.  Keep electrical cords out of the way.  Do not use floor polish or wax that makes floors slippery. If you must use wax, use non-skid floor wax.  Do not have throw rugs and other things on the floor that  can make you trip. What can I do with my stairs?  Do not leave any items on the stairs.  Make sure that there are handrails on both sides of the stairs and use them. Fix handrails that are broken or loose. Make sure that handrails are as long as the stairways.  Check any carpeting to make sure that it is firmly attached to the stairs. Fix any carpet that is loose or worn.  Avoid having throw rugs at the top or bottom of the stairs. If you do have throw rugs, attach them to the floor with carpet tape.  Make sure that you have a light switch at the top of the stairs and the bottom of the stairs. If you do not have them, ask someone to add them for you. What else can I do to help prevent falls?  Wear shoes that:  Do not have high heels.  Have rubber bottoms.  Are comfortable and fit you well.  Are closed at the toe. Do not wear sandals.  If you use a stepladder:  Make sure that it is fully opened. Do not climb a closed stepladder.  Make  sure that both sides of the stepladder are locked into place.  Ask someone to hold it for you, if possible.  Clearly mark and make sure that you can see:  Any grab bars or handrails.  First and last steps.  Where the edge of each step is.  Use tools that help you move around (mobility aids) if they are needed. These include:  Canes.  Walkers.  Scooters.  Crutches.  Turn on the lights when you go into a dark area. Replace any light bulbs as soon as they burn out.  Set up your furniture so you have a clear path. Avoid moving your furniture around.  If any of your floors are uneven, fix them.  If there are any pets around you, be aware of where they are.  Review your medicines with your doctor. Some medicines can make you feel dizzy. This can increase your chance of falling. Ask your doctor what other things that you can do to help prevent falls. This information is not intended to replace advice given to you by your health care provider. Make sure you discuss any questions you have with your health care provider. Document Released: 05/05/2009 Document Revised: 12/15/2015 Document Reviewed: 08/13/2014 Elsevier Interactive Patient Education  2017 [employer].

## 2018-02-24 NOTE — Progress Notes (Signed)
proventil

## 2018-02-24 NOTE — Telephone Encounter (Signed)
Prescription sent and pt is aware

## 2018-03-05 ENCOUNTER — Ambulatory Visit (HOSPITAL_COMMUNITY)
Admission: RE | Admit: 2018-03-05 | Discharge: 2018-03-05 | Disposition: A | Discharge status: Home / Self Care | Payer: Medicare Other | Source: Ambulatory Visit | Attending: Family Medicine | Admitting: Family Medicine

## 2018-03-05 DIAGNOSIS — Z13820 Encounter for screening for osteoporosis: Secondary | ICD-10-CM | POA: Insufficient documentation

## 2018-03-05 DIAGNOSIS — Z780 Asymptomatic menopausal state: Secondary | ICD-10-CM | POA: Diagnosis not present

## 2018-03-10 ENCOUNTER — Other Ambulatory Visit: Payer: Self-pay | Admitting: Gastroenterology

## 2018-04-01 ENCOUNTER — Other Ambulatory Visit: Payer: Self-pay

## 2018-04-02 MED ORDER — OMEPRAZOLE 20 MG PO CPDR: DELAYED_RELEASE_CAPSULE | ORAL | 3 refills | Status: DC

## 2018-04-22 ENCOUNTER — Telehealth: Payer: Self-pay | Admitting: Family Medicine

## 2018-04-22 DIAGNOSIS — E8881 Metabolic syndrome: Secondary | ICD-10-CM

## 2018-04-22 DIAGNOSIS — E785 Hyperlipidemia, unspecified: Secondary | ICD-10-CM

## 2018-04-22 DIAGNOSIS — R7303 Prediabetes: Secondary | ICD-10-CM

## 2018-04-22 NOTE — Addendum Note (Signed)
Addended by: Eual Fines on: 04/22/2018 09:53 AM   Modules accepted: Orders

## 2018-04-22 NOTE — Telephone Encounter (Signed)
Fasting lipid, cmp and EGFr and HBa1C in 6 months, please call for lab order Please send to [place] ---today

## 2018-04-22 NOTE — Telephone Encounter (Signed)
Labs ordered.

## 2018-04-23 DIAGNOSIS — E785 Hyperlipidemia, unspecified: Secondary | ICD-10-CM | POA: Diagnosis not present

## 2018-04-23 DIAGNOSIS — R7303 Prediabetes: Secondary | ICD-10-CM | POA: Diagnosis not present

## 2018-04-24 ENCOUNTER — Other Ambulatory Visit: Payer: Self-pay | Admitting: Family Medicine

## 2018-04-24 LAB — CMP14+EGFR
A/G RATIO: 1.2 (ref 1.2–2.2)
ALBUMIN: 4.1 g/dL (ref 3.5–4.8)
ALT: 11 IU/L (ref 0–32)
AST: 15 IU/L (ref 0–40)
Alkaline Phosphatase: 117 IU/L (ref 39–117)
BUN / CREAT RATIO: 12 (ref 12–28)
BUN: 14 mg/dL (ref 8–27)
Bilirubin Total: 0.5 mg/dL (ref 0.0–1.2)
CALCIUM: 9.6 mg/dL (ref 8.7–10.3)
CO2: 26 mmol/L (ref 20–29)
Chloride: 99 mmol/L (ref 96–106)
Creatinine, Ser: 1.13 mg/dL — ABNORMAL HIGH (ref 0.57–1.00)
GFR, EST AFRICAN AMERICAN: 55 mL/min/{1.73_m2} — AB (ref >59)
GFR, EST NON AFRICAN AMERICAN: 48 mL/min/{1.73_m2} — AB (ref >59)
GLOBULIN, TOTAL: 3.4 g/dL (ref 1.5–4.5)
Glucose: 105 mg/dL — ABNORMAL HIGH (ref 65–99)
Potassium: 4.3 mmol/L (ref 3.5–5.2)
SODIUM: 140 mmol/L (ref 134–144)
TOTAL PROTEIN: 7.5 g/dL (ref 6.0–8.5)

## 2018-04-24 LAB — HEMOGLOBIN A1C
Est. average glucose Bld gHb Est-mCnc: 123 mg/dL
Hgb A1c MFr Bld: 5.9 % — ABNORMAL HIGH (ref 4.8–5.6)

## 2018-04-24 LAB — LIPID PANEL
CHOLESTEROL TOTAL: 232 mg/dL — AB (ref 100–199)
Chol/HDL Ratio: 4.1 ratio (ref 0.0–4.4)
HDL: 56 mg/dL (ref >39)
LDL CALC: 143 mg/dL — AB (ref 0–99)
Triglycerides: 163 mg/dL — ABNORMAL HIGH (ref 0–149)
VLDL CHOLESTEROL CAL: 33 mg/dL (ref 5–40)

## 2018-04-28 ENCOUNTER — Other Ambulatory Visit: Payer: Self-pay

## 2018-04-28 ENCOUNTER — Ambulatory Visit (INDEPENDENT_AMBULATORY_CARE_PROVIDER_SITE_OTHER): Payer: Medicare Other | Admitting: Family Medicine

## 2018-04-28 ENCOUNTER — Encounter: Payer: Self-pay | Admitting: Family Medicine

## 2018-04-28 VITALS — BP 164/88 | HR 92 | Resp 12 | Ht 67.0 in | Wt 204.1 lb

## 2018-04-28 DIAGNOSIS — R7303 Prediabetes: Secondary | ICD-10-CM

## 2018-04-28 DIAGNOSIS — F17210 Nicotine dependence, cigarettes, uncomplicated: Secondary | ICD-10-CM | POA: Diagnosis not present

## 2018-04-28 DIAGNOSIS — Z6831 Body mass index (BMI) 31.0-31.9, adult: Secondary | ICD-10-CM

## 2018-04-28 DIAGNOSIS — F17200 Nicotine dependence, unspecified, uncomplicated: Secondary | ICD-10-CM | POA: Diagnosis not present

## 2018-04-28 DIAGNOSIS — E559 Vitamin D deficiency, unspecified: Secondary | ICD-10-CM

## 2018-04-28 DIAGNOSIS — E785 Hyperlipidemia, unspecified: Secondary | ICD-10-CM

## 2018-04-28 DIAGNOSIS — I10 Essential (primary) hypertension: Secondary | ICD-10-CM | POA: Diagnosis not present

## 2018-04-28 DIAGNOSIS — E6609 Other obesity due to excess calories: Secondary | ICD-10-CM | POA: Diagnosis not present

## 2018-04-28 DIAGNOSIS — J209 Acute bronchitis, unspecified: Secondary | ICD-10-CM

## 2018-04-28 MED ORDER — BENZONATATE 100 MG PO CAPS: 100.0000 mg | ORAL_CAPSULE | Freq: Two times a day (BID) | ORAL | 0 refills | Status: DC | PRN

## 2018-04-28 MED ORDER — AZITHROMYCIN 250 MG PO TABS
ORAL_TABLET | ORAL | 0 refills | Status: DC
Start: 2018-04-28 — End: 2018-05-28

## 2018-04-28 MED ORDER — AMLODIPINE BESYLATE 10 MG PO TABS: 10.0000 mg | ORAL_TABLET | Freq: Every day | ORAL | 3 refills | Status: DC

## 2018-04-28 MED ORDER — PRAVASTATIN SODIUM 80 MG PO TABS
80.0000 mg | ORAL_TABLET | Freq: Every day | ORAL | 3 refills | Status: DC
Start: 2018-04-28 — End: 2019-04-27

## 2018-04-28 NOTE — Assessment & Plan Note (Signed)
Uncontrolled, increase amlodipine to 10 mg daily DASH diet and commitment to daily physical activity for a minimum of 30 minutes discussed and encouraged, as a part of hypertension management. The importance of attaining a healthy weight is also discussed.  BP/Weight 04/28/2018 02/24/2018 11/19/2017 10/15/2017 08/01/2017 05/22/2017 58/12/3866  Systolic BP 548 830 141 597 331 250 871  Diastolic BP 88 82 99 88 90 91 80  Wt. (Lbs) 204.12 203 204.6 205.12 207 201 201.75  BMI 31.97 32.77 33.02 33.11 33.41 32.44 32.56

## 2018-04-28 NOTE — Assessment & Plan Note (Signed)
Asked: smokes approx 5 cigarettes/day. Advise : needs to quit to reduce lung failure, cancer, stroke and heart attack Assist: counseled for 5 mins and literature provided Arrange : f/u in 8 weeks

## 2018-04-28 NOTE — Assessment & Plan Note (Signed)
Hyperlipidemia:Low fat diet discussed and encouraged.   Lipid Panel  Lab Results  Component Value Date   CHOL 232 (H) 04/23/2018   HDL 56 04/23/2018   LDLCALC 143 (H) 04/23/2018   TRIG 163 (H) 04/23/2018   CHOLHDL 4.1 04/23/2018   Deteriorated and not at goal, increase pravachol to 80 mg and rept in 4.5 months

## 2018-04-28 NOTE — Addendum Note (Signed)
Addended by: Marion Downer A on: 04/28/2018 05:07 PM   Modules accepted: Orders

## 2018-04-28 NOTE — Assessment & Plan Note (Signed)
Antibiotic and decongestant prescribed 

## 2018-04-28 NOTE — Assessment & Plan Note (Signed)
unchanged. Patient re-educated about  the importance of commitment to a  minimum of 150 minutes of exercise per week.  The importance of healthy food choices with portion control discussed. Encouraged to start a food diary, count calories and to consider  joining a support group. Sample diet sheets offered. Goals set by the patient for the next several months.   Weight /BMI 04/28/2018 02/24/2018 11/19/2017  WEIGHT 204 lb 1.9 oz 203 lb 204 lb 9.6 oz  HEIGHT 5\' 7"  5\' 6"  5\' 6"   BMI 31.97 kg/m2 32.77 kg/m2 33.02 kg/m2

## 2018-04-28 NOTE — Patient Instructions (Addendum)
Physical exam in 4.5 months with MD  F/U in office in 5 weeks with MD for BP re evaluation and flu vaccine  You are treated for bronchitis, 2 meds are prescribed  You need to stop smoking, as this increases your risk of heart disease   Please get fasting lipid, cmp and eGFR, hBA1C, cBc, tSH and vit D 1 week prior to next visit  Please reduce fried and fatty foods and butter , your cholesterol is too high, increase in Pravachol to 80 mg daily, take two 40 mg tabs  till done  Blood pressure is too high, you need to reduce salt in diet , stop smoking and commit to exercise  Increase in amlodipine to 10 mg once daily, you may take TWO '5mg'$  amlodipine tabs together every day till you fill new script   Steps to Quit Smoking Smoking tobacco can be harmful to your health and can affect almost every organ in your body. Smoking puts you, and those around you, at risk for developing many serious chronic diseases. Quitting smoking is difficult, but it is one of the best things that you can do for your health. It is never too late to quit. What are the benefits of quitting smoking? When you quit smoking, you lower your risk of developing serious diseases and conditions, such as:  Lung cancer or lung disease, such as COPD.  Heart disease.  Stroke.  Heart attack.  Infertility.  Osteoporosis and bone fractures.  Additionally, symptoms such as coughing, wheezing, and shortness of breath may get better when you quit. You may also find that you get sick less often because your body is stronger at fighting off colds and infections. If you are pregnant, quitting smoking can help to reduce your chances of having a baby of low birth weight. How do I get ready to quit? When you decide to quit smoking, create a plan to make sure that you are successful. Before you quit:  Pick a date to quit. Set a date within the next two weeks to give you time to prepare.  Write down the reasons why you are quitting.  Keep this list in places where you will see it often, such as on your bathroom mirror or in your car or wallet.  Identify the people, places, things, and activities that make you want to smoke (triggers) and avoid them. Make sure to take these actions: ? Throw away all cigarettes at home, at work, and in your car. ? Throw away smoking accessories, such as Scientist, research (medical). ? Clean your car and make sure to empty the ashtray. ? Clean your home, including curtains and carpets.  Tell your family, friends, and coworkers that you are quitting. Support from your loved ones can make quitting easier.  Talk with your health care provider about your options for quitting smoking.  Find out what treatment options are covered by your health insurance.  What strategies can I use to quit smoking? Talk with your healthcare provider about different strategies to quit smoking. Some strategies include:  Quitting smoking altogether instead of gradually lessening how much you smoke over a period of time. Research shows that quitting "cold [place]" is more successful than gradually quitting.  Attending in-person counseling to help you build problem-solving skills. You are more likely to have success in quitting if you attend several counseling sessions. Even short sessions of 10 minutes can be effective.  Finding resources and support systems that can help you to quit smoking  and remain smoke-free after you quit. These resources are most helpful when you use them often. They can include: ? Online chats with a Social worker. ? Telephone quitlines. ? Careers information officer. ? Support groups or group counseling. ? Text messaging programs. ? Mobile phone applications.  Taking medicines to help you quit smoking. (If you are pregnant or breastfeeding, talk with your health care provider first.) Some medicines contain nicotine and some do not. Both types of medicines help with cravings, but the medicines that  include nicotine help to relieve withdrawal symptoms. Your health care provider may recommend: ? Nicotine patches, gum, or lozenges. ? Nicotine inhalers or sprays. ? Non-nicotine medicine that is taken by mouth.  Talk with your health care provider about combining strategies, such as taking medicines while you are also receiving in-person counseling. Using these two strategies together makes you more likely to succeed in quitting than if you used either strategy on its own. If you are pregnant or breastfeeding, talk with your health care provider about finding counseling or other support strategies to quit smoking. Do not take medicine to help you quit smoking unless told to do so by your health care provider. What things can I do to make it easier to quit? Quitting smoking might feel overwhelming at first, but there is a lot that you can do to make it easier. Take these important actions:  Reach out to your family and friends and ask that they support and encourage you during this time. Call telephone quitlines, reach out to support groups, or work with a counselor for support.  Ask people who smoke to avoid smoking around you.  Avoid places that trigger you to smoke, such as bars, parties, or smoke-break areas at work.  Spend time around people who do not smoke.  Lessen stress in your life, because stress can be a smoking trigger for some people. To lessen stress, try: ? Exercising regularly. ? Deep-breathing exercises. ? Yoga. ? Meditating. ? Performing a body scan. This involves closing your eyes, scanning your body from head to toe, and noticing which parts of your body are particularly tense. Purposefully relax the muscles in those areas.  Download or purchase mobile phone or tablet apps (applications) that can help you stick to your quit plan by providing reminders, tips, and encouragement. There are many free apps, such as QuitGuide from the [employer] Office manager for Disease Control and  Prevention). You can find other support for quitting smoking (smoking cessation) through smokefree.gov and other websites.  How will I feel when I quit smoking? Within the first 24 hours of quitting smoking, you may start to feel some withdrawal symptoms. These symptoms are usually most noticeable 2-3 days after quitting, but they usually do not last beyond 2-3 weeks. Changes or symptoms that you might experience include:  Mood swings.  Restlessness, anxiety, or irritation.  Difficulty concentrating.  Dizziness.  Strong cravings for sugary foods in addition to nicotine.  Mild weight gain.  Constipation.  Nausea.  Coughing or a sore throat.  Changes in how your medicines work in your body.  A depressed mood.  Difficulty sleeping (insomnia).  After the first 2-3 weeks of quitting, you may start to notice more positive results, such as:  Improved sense of smell and taste.  Decreased coughing and sore throat.  Slower heart rate.  Lower blood pressure.  Clearer skin.  The ability to breathe more easily.  Fewer sick days.  Quitting smoking is very challenging for most people. Do  not get discouraged if you are not successful the first time. Some people need to make many attempts to quit before they achieve long-term success. Do your best to stick to your quit plan, and talk with your health care provider if you have any questions or concerns. This information is not intended to replace advice given to you by your health care provider. Make sure you discuss any questions you have with your health care provider. Document Released: 07/03/2001 Document Revised: 03/06/2016 Document Reviewed: 11/23/2014 Elsevier Interactive Patient Education  [employer].

## 2018-04-28 NOTE — Progress Notes (Signed)
[name]     MRN: [id number]      DOB: [birth year]   HPI [name] is here for follow up and re-evaluation of chronic medical conditions, medication management and review of any available recent lab and radiology data.  Preventive health is updated, specifically  Cancer screening and Immunization.   Questions or concerns regarding consultations or procedures which the PT has had in the interim are  addressed. The PT denies any adverse reactions to current medications since the last visit.  5 day h/o chest congestion an dhas resumed smoking up to 5 cigarettes daily ROS Denies recent fever or chills. Denies sinus pressure, nasal congestion, ear pain or sore throat.  Denies chest pains, palpitations and leg swelling Denies abdominal pain, nausea, vomiting,diarrhea or constipation.   Denies dysuria, frequency, hesitancy or incontinence. Denies joint pain, swelling and limitation in mobility. Denies headaches, seizures, numbness, or tingling. Denies depression, anxiety or insomnia. Denies skin break down or rash.   PE  BP (!) 164/88 (BP Location: Left Arm, Patient Position: Sitting, Cuff Size: Large)   Pulse 92   Resp 12   Ht 5\' 7"  (1.702 m)   Wt 204 lb 1.9 oz (92.6 kg)   SpO2 94% Comment: room air  BMI 31.97 kg/m   Patient alert and oriented and in no cardiopulmonary distress.  HEENT: No facial asymmetry, EOMI,   oropharynx pink and moist.  Neck supple no JVD, no mass.  Chest: scattered crackles and  Few wheezes  CVS: S1, S2 no murmurs, no S3.Regular rate.  ABD: Soft non tender.   Ext: No edema  MS: Adequate ROM spine, shoulders, hips and knees.  Skin: Intact, no ulcerations or rash noted.  Psych: Good eye contact, normal affect. Memory intact not anxious or depressed appearing.  CNS: CN 2-12 intact, power,  normal throughout.no focal deficits noted.   Assessment & Plan  Acute bronchitis Antibiotic and decongestant prescribed  Essential  hypertension Uncontrolled, increase amlodipine to 10 mg daily DASH diet and commitment to daily physical activity for a minimum of 30 minutes discussed and encouraged, as a part of hypertension management. The importance of attaining a healthy weight is also discussed.  BP/Weight 04/28/2018 02/24/2018 11/19/2017 10/15/2017 08/01/2017 05/22/2017 62/95/2841  Systolic BP 324 401 027 253 664 403 474  Diastolic BP 88 82 99 88 90 91 80  Wt. (Lbs) 204.12 203 204.6 205.12 207 201 201.75  BMI 31.97 32.77 33.02 33.11 33.41 32.44 32.56       NICOTINE ADDICTION Asked: smokes approx 5 cigarettes/day. Advise : needs to quit to reduce lung failure, cancer, stroke and heart attack Assist: counseled for 5 mins and literature provided Arrange : f/u in 8 weeks  Hyperlipidemia LDL goal <100 Hyperlipidemia:Low fat diet discussed and encouraged.   Lipid Panel  Lab Results  Component Value Date   CHOL 232 (H) 04/23/2018   HDL 56 04/23/2018   LDLCALC 143 (H) 04/23/2018   TRIG 163 (H) 04/23/2018   CHOLHDL 4.1 04/23/2018   Deteriorated and not at goal, increase pravachol to 80 mg and rept in 4.5 months    Obesity unchanged. Patient re-educated about  the importance of commitment to a  minimum of 150 minutes of exercise per week.  The importance of healthy food choices with portion control discussed. Encouraged to start a food diary, count calories and to consider  joining a support group. Sample diet sheets offered. Goals set by the patient for the next several months.   Weight /  BMI 04/28/2018 02/24/2018 11/19/2017  WEIGHT 204 lb 1.9 oz 203 lb 204 lb 9.6 oz  HEIGHT 5\' 7"  5\' 6"  5\' 6"   BMI 31.97 kg/m2 32.77 kg/m2 33.02 kg/m2

## 2018-05-28 ENCOUNTER — Encounter: Payer: Self-pay | Admitting: Gastroenterology

## 2018-05-28 ENCOUNTER — Ambulatory Visit: Payer: Medicare Other | Admitting: Gastroenterology

## 2018-05-28 VITALS — BP 175/96 | HR 104 | Temp 97.1°F | Ht 67.0 in | Wt 199.0 lb

## 2018-05-28 DIAGNOSIS — K5900 Constipation, unspecified: Secondary | ICD-10-CM | POA: Diagnosis not present

## 2018-05-28 DIAGNOSIS — K219 Gastro-esophageal reflux disease without esophagitis: Secondary | ICD-10-CM

## 2018-05-28 MED ORDER — PANTOPRAZOLE SODIUM 40 MG PO TBEC: 40.0000 mg | DELAYED_RELEASE_TABLET | Freq: Every day | ORAL | 3 refills | Status: DC

## 2018-05-28 NOTE — Assessment & Plan Note (Signed)
Continue Linzess 290 mcg once daily. Will see if there is a tier exception possible. Otherwise, provide samples as needed.

## 2018-05-28 NOTE — Progress Notes (Signed)
cc'ed to pcp °

## 2018-05-28 NOTE — Assessment & Plan Note (Signed)
Chronically on Prilosec, previously on Nexium. Feels gassy/bloated. Will trial Protonix, diet sheet for gas/bloat provided, GERD precautions. No alarm symptoms. Call in 2 weeks. Return in 2 months.

## 2018-05-28 NOTE — Progress Notes (Signed)
Referring Provider: Fayrene Helper, MD Primary Care Physician:  Fayrene Helper, MD  Primary GI: Dr. Oneida Alar    Chief Complaint  Patient presents with  . Gastroesophageal Reflux    HPI:   [name] is a 77 y.o. female presenting today with a history of GERD and constipation, routine follow-up.  Linzess 290 mcg manages constipation the best. She has had difficulties affording this as it is 45$ per month.   GERD: Prilosec chronically. Nexium distant past. Notes a heaviness/bloating in upper abdomen after eating. Feels like if she could burp or pass gas she would feel better. Likes beans, cabbage. States eats whatever she wants. No N/V, no dysphagia, no weight loss. Taking 81 mg aspirin.   Past Medical History:  Diagnosis Date  . COPD (chronic obstructive pulmonary disease) ([place])   . Hiatal hernia without gangrene and obstruction 03/23/2015  . Hypertension 2004  . Lung nodule seen on imaging study   . Nicotine addiction   . OA (osteoarthritis) of knee   . Obesity   . Sinusitis     Past Surgical History:  Procedure Laterality Date  . bilateral cataract surgery  7/27 & 03/01/09   Dr. Gershon Crane    . cholecystectomy    . COLONOSCOPY  04/2004   Dr. Leane Para Smith-->melanosis coli  . COLONOSCOPY WITH ESOPHAGOGASTRODUODENOSCOPY (EGD) N/A 02/25/2013   Dr. Fields:Normal mucosa in the terminal ileum/Severe melanosis throughout the entire examined colon/ONE COLON POLYP REMOVED (tubular adenoma)/Mild diverticulosis  in the sigmoid colon/EGD:The mucosa of the esophagus appeared normal/Non-erosive gastritis, empiric dilation with Savary dilator  . ESOPHAGOGASTRODUODENOSCOPY  11/07/2010   Jenkins:hiatal hernia/no evidence of Barrett esophagitis.  The Z-line was noted to be at 39 cm from the teeth. CLO test negative  . ESOPHAGOGASTRODUODENOSCOPY N/A 09/11/2013   Dr.Fields-  incomplete esophageal web in mid-esophagus, medium sized hialtal hernia, PUD. bx=focal erosion with inflammation  and fibrosis  . ESOPHAGOGASTRODUODENOSCOPY N/A 07/06/2014   healed ulcers, persistent erosions on aspirin, small hiatal hernia  . knee arthroscopy right    . LEFT HEART CATHETERIZATION WITH CORONARY ANGIOGRAM N/A 04/08/2014   Procedure: LEFT HEART CATHETERIZATION WITH CORONARY ANGIOGRAM;  Surgeon: Sinclair Grooms, MD;  Location: [facility] CATH LAB;  Service: Cardiovascular;  Laterality: N/A;  . MALONEY DILATION N/A 02/25/2013   Procedure: Venia Minks DILATION;  Surgeon: Danie Binder, MD;  Location: [facility] ENDO SU[facility];  Service: Endoscopy;  Laterality: N/A;  . PARTIAL HYSTERECTOMY    . SAVORY DILATION N/A 02/25/2013   Procedure: SAVORY DILATION;  Surgeon: Danie Binder, MD;  Location: [facility] ENDO SU[facility];  Service: Endoscopy;  Laterality: N/A;  . total knee arthroplasty right  05/29/2005   Dr. Aline Brochure     Current Outpatient Medications  Medication Sig Dispense Refill  . albuterol (PROVENTIL HFA;VENTOLIN HFA) 108 (90 Base) MCG/ACT inhaler Inhale 2 puffs into the lungs every 6 (six) hours as needed for wheezing or shortness of breath. 1 Inhaler 1  . ALOE VERA PO Take 500 mg by mouth daily.    Marland Kitchen amLODipine (NORVASC) 10 MG tablet Take 1 tablet (10 mg total) by mouth daily. 90 tablet 3  . aspirin EC 81 MG tablet Take 81 mg by mouth daily.    . benzonatate (TESSALON) 100 MG capsule Take 1 capsule (100 mg total) by mouth 2 (two) times daily as needed for cough. 14 capsule 0  . Calcium Carbonate-Vitamin D (CALCIUM + D PO) Take 1 tablet by mouth daily.    . Cholecalciferol 400  units CAPS Take 1 capsule by mouth daily.    . fluticasone (FLONASE) 50 MCG/ACT nasal spray Place 2 sprays into both nostrils daily as needed for allergies or rhinitis. 16 g 5  . hydrochlorothiazide (HYDRODIURIL) 25 MG tablet TAKE 1 TABLET(25 MG) BY MOUTH DAILY 90 tablet 1  . montelukast (SINGULAIR) 10 MG tablet Take 1 tablet (10 mg total) by mouth at bedtime. 30 tablet 3  . Multiple Vitamin (MULTIVITAMIN) tablet Take 1 tablet by mouth daily.    Marland Kitchen  omeprazole (PRILOSEC) 20 MG capsule TAKE 1 CAPSULE BY MOUTH EVERY DAY 30 MINUTES BEFORE BREAKFAST 90 capsule 3  . pravastatin (PRAVACHOL) 80 MG tablet Take 1 tablet (80 mg total) by mouth daily. Dose increase effective 10/07 2019 90 tablet 3  . sodium chloride (OCEAN) 0.65 % SOLN nasal spray Place 1 spray into both nostrils as needed for congestion.    . pantoprazole (PROTONIX) 40 MG tablet Take 1 tablet (40 mg total) by mouth daily. 30 minutes before breakfast 90 tablet 3   No current facility-administered medications for this visit.     Allergies as of 05/28/2018  . (No Known Allergies)    Family History  Problem Relation Age of Onset  . Diabetes Mother   . Hypertension Mother   . Coronary artery disease Mother   . Pneumonia Father   . Hypertension Sister   . Hypertension Sister   . Hypertension Brother        Psychologist, forensic  . Diabetes Brother   . Hypertension Brother   . Colon cancer Neg Hx   . Liver disease Neg Hx     Social History   Socioeconomic History  . Marital status: Widowed    Spouse name: Not on file  . Number of children: 4  . Years of education: Not on file  . Highest education level: Not on file  Occupational History  . Occupation: employed     Fish farm manager: RETIRED  Social Needs  . Financial resource strain: Not very hard  . Food insecurity:    Worry: Sometimes true    Inability: Sometimes true  . Transportation needs:    Medical: No    Non-medical: No  Tobacco Use  . Smoking status: Current Some Day Smoker    Packs/day: 0.10    Years: 10.00    Pack years: 1.00    Types: Cigarettes    Start date: 07/23/1962  . Smokeless tobacco: Never Used  . Tobacco comment: 3 per day   Substance and Sexual Activity  . Alcohol use: No    Alcohol/week: 0.0 standard drinks  . Drug use: No  . Sexual activity: Never  Lifestyle  . Physical activity:    Days per week: 0 days    Minutes per session: 0 min  . Stress: Only a little  Relationships  . Social  connections:    Talks on phone: More than three times a week    Gets together: Twice a week    Attends religious service: More than 4 times per year    Active member of club or organization: Not on file    Attends meetings of clubs or organizations: More than 4 times per year    Relationship status: Widowed  Other Topics Concern  . Not on file  Social History Narrative  . Not on file    Review of Systems: Gen: Denies fever, chills, anorexia. Denies fatigue, weakness, weight loss.  CV: Denies chest pain, palpitations, syncope, peripheral edema, and claudication.  Resp: Denies dyspnea at rest, cough, wheezing, coughing up blood, and pleurisy. GI: see HPI  Derm: Denies rash, itching, dry skin Psych: Denies depression, anxiety, memory loss, confusion. No homicidal or suicidal ideation.  Heme: Denies bruising, bleeding, and enlarged lymph nodes.  Physical Exam: BP (!) 175/96   Pulse (!) 104   Temp (!) 97.1 F (36.2 C) (Oral)   Ht 5\' 7"  (1.702 m)   Wt 199 lb (90.3 kg)   BMI 31.17 kg/m  General:   Alert and oriented. No distress noted. Pleasant and cooperative.  Head:  Normocephalic and atraumatic. Eyes:  Conjuctiva clear without scleral icterus. Mouth:  Oral mucosa pink and moist.  Abdomen:  +BS, soft, non-tender and non-distended. No rebound or guarding. No HSM or masses noted. Diastasis recti noted Msk:  Symmetrical without gross deformities. Normal posture. Extremities:  Without edema. Neurologic:  Alert and  oriented x4 Psych:  Alert and cooperative. Normal mood and affect.

## 2018-05-28 NOTE — Patient Instructions (Signed)
I would like for you to stop omeprazole (Prilosec). I have sent pantoprazole (Protonix) to the pharmacy. Take this once each morning, 30 minutes before breakfast. Call me in 2 weeks with how you are doing.  I provided a handout on items that can cause gas/bloating. Try to avoid these and see how this helps your symptoms.  We have given samples of Linzess and will see if there is anything we can do to help with the cost.  We will see you in 2 months to keep a close eye on you!  It was a pleasure to see you today. I strive to create trusting relationships with patients to provide genuine, compassionate, and quality care. I value your feedback. If you receive a survey regarding your visit,  I greatly appreciate you taking time to fill this out.   Annitta Needs, PhD, ANP-BC [facility]

## 2018-05-30 ENCOUNTER — Other Ambulatory Visit: Payer: Self-pay | Admitting: Family Medicine

## 2018-05-30 DIAGNOSIS — E559 Vitamin D deficiency, unspecified: Secondary | ICD-10-CM | POA: Diagnosis not present

## 2018-05-30 DIAGNOSIS — R7303 Prediabetes: Secondary | ICD-10-CM | POA: Diagnosis not present

## 2018-05-30 DIAGNOSIS — I10 Essential (primary) hypertension: Secondary | ICD-10-CM | POA: Diagnosis not present

## 2018-05-30 DIAGNOSIS — E785 Hyperlipidemia, unspecified: Secondary | ICD-10-CM | POA: Diagnosis not present

## 2018-05-31 LAB — LIPID PANEL W/O CHOL/HDL RATIO
CHOLESTEROL TOTAL: 157 mg/dL (ref 100–199)
HDL: 47 mg/dL (ref >39)
LDL Calculated: 91 mg/dL (ref 0–99)
Triglycerides: 94 mg/dL (ref 0–149)
VLDL Cholesterol Cal: 19 mg/dL (ref 5–40)

## 2018-05-31 LAB — CBC/DIFF AMBIGUOUS DEFAULT
BASOS ABS: 0.1 10*3/uL (ref 0.0–0.2)
Basos: 1 %
EOS (ABSOLUTE): 0.2 10*3/uL (ref 0.0–0.4)
Eos: 3 %
Hematocrit: 38.6 % (ref 34.0–46.6)
Hemoglobin: 13 g/dL (ref 11.1–15.9)
Immature Grans (Abs): 0 10*3/uL (ref 0.0–0.1)
Immature Granulocytes: 0 %
LYMPHS: 37 %
Lymphocytes Absolute: 2.7 10*3/uL (ref 0.7–3.1)
MCH: 28.8 pg (ref 26.6–33.0)
MCHC: 33.7 g/dL (ref 31.5–35.7)
MCV: 85 fL (ref 79–97)
Monocytes Absolute: 0.7 10*3/uL (ref 0.1–0.9)
Monocytes: 10 %
NEUTROS ABS: 3.5 10*3/uL (ref 1.4–7.0)
Neutrophils: 49 %
Platelets: 293 10*3/uL (ref 150–450)
RBC: 4.52 x10E6/uL (ref 3.77–5.28)
RDW: 14.1 % (ref 12.3–15.4)
WBC: 7.3 10*3/uL (ref 3.4–10.8)

## 2018-05-31 LAB — COMPREHENSIVE METABOLIC PANEL
ALK PHOS: 119 IU/L — AB (ref 39–117)
ALT: 9 IU/L (ref 0–32)
AST: 16 IU/L (ref 0–40)
Albumin/Globulin Ratio: 1.4 (ref 1.2–2.2)
Albumin: 4.2 g/dL (ref 3.5–4.8)
BILIRUBIN TOTAL: 0.6 mg/dL (ref 0.0–1.2)
BUN / CREAT RATIO: 10 — AB (ref 12–28)
BUN: 12 mg/dL (ref 8–27)
CO2: 26 mmol/L (ref 20–29)
Calcium: 9.4 mg/dL (ref 8.7–10.3)
Chloride: 101 mmol/L (ref 96–106)
Creatinine, Ser: 1.15 mg/dL — ABNORMAL HIGH (ref 0.57–1.00)
GFR calc Af Amer: 54 mL/min/{1.73_m2} — ABNORMAL LOW (ref >59)
GFR calc non Af Amer: 47 mL/min/{1.73_m2} — ABNORMAL LOW (ref >59)
GLUCOSE: 107 mg/dL — AB (ref 65–99)
Globulin, Total: 3 g/dL (ref 1.5–4.5)
POTASSIUM: 4.3 mmol/L (ref 3.5–5.2)
Sodium: 138 mmol/L (ref 134–144)
Total Protein: 7.2 g/dL (ref 6.0–8.5)

## 2018-05-31 LAB — SPECIMEN STATUS REPORT

## 2018-05-31 LAB — TSH: TSH: 0.847 u[IU]/mL (ref 0.450–4.500)

## 2018-05-31 LAB — VITAMIN D 25 HYDROXY (VIT D DEFICIENCY, FRACTURES): Vit D, 25-Hydroxy: 14 ng/mL — ABNORMAL LOW (ref 30.0–100.0)

## 2018-05-31 LAB — HGB A1C W/O EAG: Hgb A1c MFr Bld: 5.9 % — ABNORMAL HIGH (ref 4.8–5.6)

## 2018-06-04 ENCOUNTER — Encounter: Payer: Self-pay | Admitting: Family Medicine

## 2018-06-04 ENCOUNTER — Ambulatory Visit (INDEPENDENT_AMBULATORY_CARE_PROVIDER_SITE_OTHER): Payer: Medicare Other | Admitting: Family Medicine

## 2018-06-04 VITALS — BP 158/88 | HR 96 | Resp 12 | Ht 67.0 in | Wt 201.0 lb

## 2018-06-04 DIAGNOSIS — E6609 Other obesity due to excess calories: Secondary | ICD-10-CM

## 2018-06-04 DIAGNOSIS — J302 Other seasonal allergic rhinitis: Secondary | ICD-10-CM | POA: Diagnosis not present

## 2018-06-04 DIAGNOSIS — Z6831 Body mass index (BMI) 31.0-31.9, adult: Secondary | ICD-10-CM

## 2018-06-04 DIAGNOSIS — E785 Hyperlipidemia, unspecified: Secondary | ICD-10-CM

## 2018-06-04 DIAGNOSIS — Z23 Encounter for immunization: Secondary | ICD-10-CM | POA: Diagnosis not present

## 2018-06-04 DIAGNOSIS — I10 Essential (primary) hypertension: Secondary | ICD-10-CM

## 2018-06-04 DIAGNOSIS — K219 Gastro-esophageal reflux disease without esophagitis: Secondary | ICD-10-CM

## 2018-06-04 DIAGNOSIS — F17200 Nicotine dependence, unspecified, uncomplicated: Secondary | ICD-10-CM

## 2018-06-04 MED ORDER — SPIRONOLACTONE 25 MG PO TABS: 25.0000 mg | ORAL_TABLET | Freq: Every day | ORAL | 3 refills | Status: DC

## 2018-06-04 NOTE — Patient Instructions (Signed)
Annual physical exam with MD Jan 11 or after, call if you need me sooner  Flu vaccine today  Pklease stop smoking  Blood pressure medication is   Amlodipine 10 mg ONE DAILY  Spironolactone 25 mg ONE daily  STOP hCTZ  All the best for 2020  Thanks for choosing [facility], we consider it a privelige to serve you.

## 2018-06-07 ENCOUNTER — Encounter: Payer: Self-pay | Admitting: Family Medicine

## 2018-06-07 NOTE — Assessment & Plan Note (Signed)
Managed by GI and controlled 

## 2018-06-07 NOTE — Progress Notes (Signed)
[name]     MRN: [id number]      DOB: [birth year]   HPI [name] is here for follow up and re-evaluation of chronic medical conditions, medication management and review of any available recent lab and radiology data.  Preventive health is updated, specifically  Cancer screening and Immunization.   Questions or concerns regarding consultations or procedures which the PT has had in the interim are  addressed. The PT denies any adverse reactions to current medications since the last visit.  There are no new concerns.  There are no specific complaints   ROS Denies recent fever or chills. Denies sinus pressure, nasal congestion, ear pain or sore throat. Denies chest congestion, productive cough or wheezing. Denies chest pains, palpitations and leg swelling Denies abdominal pain, nausea, vomiting,diarrhea or constipation.   Denies dysuria, frequency, hesitancy or incontinence. Denies uncontrolled joint pain, swelling and limitation in mobility. Denies headaches, seizures, numbness, or tingling. Denies depression, anxiety or insomnia. Denies skin break down or rash.   PE  BP (!) 158/88   Pulse 96   Resp 12   Ht 5\' 7"  (1.702 m)   Wt 201 lb (91.2 kg)   SpO2 97% Comment: room air  BMI 31.48 kg/m   Patient alert and oriented and in no cardiopulmonary distress.  HEENT: No facial asymmetry, EOMI,   oropharynx pink and moist.  Neck supple no JVD, no mass.  Chest: Clear to auscultation bilaterally.  CVS: S1, S2 no murmurs, no S3.Regular rate.  ABD: Soft non tender.   Ext: No edema  MS: Decreased ROM spine, shoulders, hips and knees.  Skin: Intact, no ulcerations or rash noted.  Psych: Good eye contact, normal affect. Memory intact not anxious or depressed appearing.  CNS: CN 2-12 intact, power,  normal throughout.no focal deficits noted.   Assessment & Plan  Essential hypertension UnControlled, needs to bring in medication for review with nurse  DASH diet and  commitment to daily physical activity for a minimum of 30 minutes discussed and encouraged, as a part of hypertension management. The importance of attaining a healthy weight is also discussed.  BP/Weight 06/04/2018 05/28/2018 04/28/2018 02/24/2018 11/19/2017 10/15/2017 5/85/2778  Systolic BP 242 353 614 431 540 086 761  Diastolic BP 88 96 88 82 99 88 90  Wt. (Lbs) 201 199 204.12 203 204.6 205.12 207  BMI 31.48 31.17 31.97 32.77 33.02 33.11 33.41       Obesity Deteriorated. Patient re-educated about  the importance of commitment to a  minimum of 150 minutes of exercise per week.  The importance of healthy food choices with portion control discussed. Encouraged to start a food diary, count calories and to consider  joining a support group. Sample diet sheets offered. Goals set by the patient for the next several months.   Weight /BMI 06/04/2018 05/28/2018 04/28/2018  WEIGHT 201 lb 199 lb 204 lb 1.9 oz  HEIGHT 5\' 7"  5\' 7"  5\' 7"   BMI 31.48 kg/m2 31.17 kg/m2 31.97 kg/m2      Seasonal allergies No current flare , will use medication as needed  NICOTINE ADDICTION Asked: confirms currently smoking cigarettes Assist : counseled for 5 mins and literature provided Advise: needs to quit to reduce risk of MI, CVA and progression in her lung disease ASSESS; unwilling to set quit date but wishes to quit Arrange: f/u in 3 months  Hyperlipidemia LDL goal <100 Hyperlipidemia:Low fat diet discussed and encouraged.   Lipid Panel  Lab Results  Component Value Date  CHOL 157 05/30/2018   HDL 47 05/30/2018   LDLCALC 91 05/30/2018   TRIG 94 05/30/2018   CHOLHDL 4.1 04/23/2018   Controlled, no change in medication     GERD (gastroesophageal reflux disease) Managed by GI and controlled

## 2018-06-07 NOTE — Assessment & Plan Note (Signed)
Hyperlipidemia:Low fat diet discussed and encouraged.   Lipid Panel  Lab Results  Component Value Date   CHOL 157 05/30/2018   HDL 47 05/30/2018   LDLCALC 91 05/30/2018   TRIG 94 05/30/2018   CHOLHDL 4.1 04/23/2018   Controlled, no change in medication

## 2018-06-07 NOTE — Assessment & Plan Note (Signed)
Asked: confirms currently smoking cigarettes Assist : counseled for 5 mins and literature provided Advise: needs to quit to reduce risk of MI, CVA and progression in her lung disease ASSESS; unwilling to set quit date but wishes to quit Arrange: f/u in 3 months

## 2018-06-07 NOTE — Assessment & Plan Note (Signed)
UnControlled, needs to bring in medication for review with nurse  DASH diet and commitment to daily physical activity for a minimum of 30 minutes discussed and encouraged, as a part of hypertension management. The importance of attaining a healthy weight is also discussed.  BP/Weight 06/04/2018 05/28/2018 04/28/2018 02/24/2018 11/19/2017 10/15/2017 9/75/3005  Systolic BP 110 211 173 567 014 103 013  Diastolic BP 88 96 88 82 99 88 90  Wt. (Lbs) 201 199 204.12 203 204.6 205.12 207  BMI 31.48 31.17 31.97 32.77 33.02 33.11 33.41

## 2018-06-07 NOTE — Assessment & Plan Note (Signed)
No current flare , will use medication as needed

## 2018-06-07 NOTE — Assessment & Plan Note (Signed)
Deteriorated. Patient re-educated about  the importance of commitment to a  minimum of 150 minutes of exercise per week.  The importance of healthy food choices with portion control discussed. Encouraged to start a food diary, count calories and to consider  joining a support group. Sample diet sheets offered. Goals set by the patient for the next several months.   Weight /BMI 06/04/2018 05/28/2018 04/28/2018  WEIGHT 201 lb 199 lb 204 lb 1.9 oz  HEIGHT 5\' 7"  5\' 7"  5\' 7"   BMI 31.48 kg/m2 31.17 kg/m2 31.97 kg/m2

## 2018-07-04 ENCOUNTER — Other Ambulatory Visit: Payer: Self-pay | Admitting: Family Medicine

## 2018-07-04 ENCOUNTER — Telehealth: Payer: Self-pay

## 2018-07-04 MED ORDER — BENZONATATE 100 MG PO CAPS: 100.0000 mg | ORAL_CAPSULE | Freq: Two times a day (BID) | ORAL | 0 refills | Status: DC | PRN

## 2018-07-04 MED ORDER — AZITHROMYCIN 250 MG PO TABS: ORAL_TABLET | ORAL | 0 refills | Status: DC

## 2018-07-04 NOTE — Telephone Encounter (Signed)
Tessalon perle and azithromycin have been sent to her pharmacy, pls let her know

## 2018-07-04 NOTE — Telephone Encounter (Signed)
Patient called and states that she is having nasal congestion, coughing up thick yellow foul smelling mucous, headache with pain around the eyes, and "itchy ears". Denies Fever, denies sore throat. Has been getting worse since Monday of this week. Uses [facility] [place]. Will you call her in an antibiotic.

## 2018-07-31 ENCOUNTER — Ambulatory Visit: Payer: Medicare Other | Admitting: Gastroenterology

## 2018-07-31 ENCOUNTER — Encounter: Payer: Self-pay | Admitting: Gastroenterology

## 2018-07-31 VITALS — BP 188/94 | HR 102 | Temp 97.1°F | Ht 66.0 in | Wt 198.2 lb

## 2018-07-31 DIAGNOSIS — K219 Gastro-esophageal reflux disease without esophagitis: Secondary | ICD-10-CM | POA: Diagnosis not present

## 2018-07-31 DIAGNOSIS — K5900 Constipation, unspecified: Secondary | ICD-10-CM

## 2018-07-31 NOTE — Progress Notes (Signed)
cc'd to pcp 

## 2018-07-31 NOTE — Assessment & Plan Note (Signed)
Doing well on Protonix once each morning. Continue diet/behavior modifications. Return in 6 months.

## 2018-07-31 NOTE — Patient Instructions (Signed)
Continue taking Protonix each morning on an empty stomach, at least 30 minutes before breakfast as you are doing.  Let me know where the best place to send the [place] would be.  Have a great New Year!  We will see you back in 6 months.  I enjoyed seeing you again today! As you know, I value our relationship and want to provide genuine, compassionate, and quality care. I welcome your feedback. If you receive a survey regarding your visit,  I greatly appreciate you taking time to fill this out. See you next time!  Annitta Needs, PhD, ANP-BC [facility]

## 2018-07-31 NOTE — Progress Notes (Signed)
Referring Provider: Fayrene Helper, MD Primary Care Physician:  Fayrene Helper, MD  Primary GI: Dr. Oneida Alar   Chief Complaint  Patient presents with  . Gastroesophageal Reflux    c/o a lot of gas, coughing a lot    HPI:   [name] is a 78 y.o. female presenting today with a history of GERD, constipation, distant history of PUD. Last seen in Nov 2019.Last EGD in Dec 2015 with healed ulcers, persistent erosions on aspirin, and small hiatal hernia.   Historically, Linzess 290 mcg managed constipated best. Difficulties affording this due to copays. She will be contacting us with where to send this where assistance may be best obtained.   GERD: Prilosec and Nexium in the past. Trial of Protonix at last visit. Pharmacy had said to take with food despite the prescription that was sent for 30 minutes before breakfast. Now taking on an empty stomach because she realized this wasn't right. GERD much better.  Gas: loves to eat greens. Supplementing with Ensure. Taking gas-x. Trying to avoid foods that cause gas.   Doing well overall. Next colonoscopy due in 2024 if benefits outweigh the risks.   Past Medical History:  Diagnosis Date  . COPD (chronic obstructive pulmonary disease) ([place])   . Hiatal hernia without gangrene and obstruction 03/23/2015  . Hypertension 2004  . Lung nodule seen on imaging study   . Nicotine addiction   . OA (osteoarthritis) of knee   . Obesity   . Sinusitis     Past Surgical History:  Procedure Laterality Date  . bilateral cataract surgery  7/27 & 03/01/09   Dr. Gershon Crane    . cholecystectomy    . COLONOSCOPY  04/2004   Dr. Leane Para Smith-->melanosis coli  . COLONOSCOPY WITH ESOPHAGOGASTRODUODENOSCOPY (EGD) N/A 02/25/2013   Dr. Fields:Normal mucosa in the terminal ileum/Severe melanosis throughout the entire examined colon/ONE COLON POLYP REMOVED (tubular adenoma)/Mild diverticulosis  in the sigmoid colon/EGD:The mucosa of the esophagus appeared  normal/Non-erosive gastritis, empiric dilation with Savary dilator  . ESOPHAGOGASTRODUODENOSCOPY  11/07/2010   Jenkins:hiatal hernia/no evidence of Barrett esophagitis.  The Z-line was noted to be at 39 cm from the teeth. CLO test negative  . ESOPHAGOGASTRODUODENOSCOPY N/A 09/11/2013   Dr.Fields-  incomplete esophageal web in mid-esophagus, medium sized hialtal hernia, PUD. bx=focal erosion with inflammation and fibrosis  . ESOPHAGOGASTRODUODENOSCOPY N/A 07/06/2014   healed ulcers, persistent erosions on aspirin, small hiatal hernia  . knee arthroscopy right    . LEFT HEART CATHETERIZATION WITH CORONARY ANGIOGRAM N/A 04/08/2014   Procedure: LEFT HEART CATHETERIZATION WITH CORONARY ANGIOGRAM;  Surgeon: Sinclair Grooms, MD;  Location: [facility] CATH LAB;  Service: Cardiovascular;  Laterality: N/A;  . MALONEY DILATION N/A 02/25/2013   Procedure: Venia Minks DILATION;  Surgeon: Danie Binder, MD;  [facility];  Service: Endoscopy;  Laterality: N/A;  . PARTIAL HYSTERECTOMY    . SAVORY DILATION N/A 02/25/2013   Procedure: SAVORY DILATION;  Surgeon: Danie Binder, MD;  Location: [facility];  Service: Endoscopy;  Laterality: N/A;  . total knee arthroplasty right  05/29/2005   Dr. Aline Brochure     Current Outpatient Medications  Medication Sig Dispense Refill  . albuterol (PROVENTIL HFA;VENTOLIN HFA) 108 (90 Base) MCG/ACT inhaler Inhale 2 puffs into the lungs every 6 (six) hours as needed for wheezing or shortness of breath. 1 Inhaler 1  . ALOE VERA PO Take 500 mg by mouth as needed.     Marland Kitchen aspirin EC  81 MG tablet Take 81 mg by mouth daily.    . Calcium Carbonate-Vitamin D (CALCIUM + D PO) Take 1 tablet by mouth daily.    . Cholecalciferol 400 units CAPS Take 1 capsule by mouth daily.    . fluticasone (FLONASE) 50 MCG/ACT nasal spray Place 2 sprays into both nostrils daily as needed for allergies or rhinitis. 16 g 5  . linaclotide (LINZESS) 290 MCG CAPS capsule Take 290 mcg by mouth daily before  breakfast.    . pantoprazole (PROTONIX) 40 MG tablet Take 1 tablet (40 mg total) by mouth daily. 30 minutes before breakfast 90 tablet 3  . pravastatin (PRAVACHOL) 80 MG tablet Take 1 tablet (80 mg total) by mouth daily. Dose increase effective 10/07 2019 90 tablet 3  . simethicone (MYLICON) 829 MG chewable tablet Chew 125 mg by mouth every 6 (six) hours as needed for flatulence.    . sodium chloride (OCEAN) 0.65 % SOLN nasal spray Place 1 spray into both nostrils as needed for congestion.    Marland Kitchen spironolactone (ALDACTONE) 25 MG tablet Take 1 tablet (25 mg total) by mouth daily. 30 tablet 3   No current facility-administered medications for this visit.     Allergies as of 07/31/2018  . (No Known Allergies)    Family History  Problem Relation Age of Onset  . Diabetes Mother   . Hypertension Mother   . Coronary artery disease Mother   . Pneumonia Father   . Hypertension Sister   . Hypertension Sister   . Hypertension Brother        Psychologist, forensic  . Diabetes Brother   . Hypertension Brother   . Colon cancer Neg Hx   . Liver disease Neg Hx     Social History   Socioeconomic History  . Marital status: Widowed    Spouse name: Not on file  . Number of children: 4  . Years of education: Not on file  . Highest education level: Not on file  Occupational History  . Occupation: employed     Fish farm manager: RETIRED  Social Needs  . Financial resource strain: Not very hard  . Food insecurity:    Worry: Sometimes true    Inability: Sometimes true  . Transportation needs:    Medical: No    Non-medical: No  Tobacco Use  . Smoking status: Current Some Day Smoker    Packs/day: 0.10    Years: 10.00    Pack years: 1.00    Types: Cigarettes    Start date: 07/23/1962  . Smokeless tobacco: Never Used  . Tobacco comment: 3 per day   Substance and Sexual Activity  . Alcohol use: No    Alcohol/week: 0.0 standard drinks  . Drug use: No  . Sexual activity: Never  Lifestyle  . Physical activity:     Days per week: 0 days    Minutes per session: 0 min  . Stress: Only a little  Relationships  . Social connections:    Talks on phone: More than three times a week    Gets together: Twice a week    Attends religious service: More than 4 times per year    Active member of club or organization: Not on file    Attends meetings of clubs or organizations: More than 4 times per year    Relationship status: Widowed  Other Topics Concern  . Not on file  Social History Narrative  . Not on file    Review of Systems: Gen:  Denies fever, chills, anorexia. Denies fatigue, weakness, weight loss.  CV: Denies chest pain, palpitations, syncope, peripheral edema, and claudication. Resp: Denies dyspnea at rest, cough, wheezing, coughing up blood, and pleurisy. GI: see HPI  Derm: Denies rash, itching, dry skin Psych: Denies depression, anxiety, memory loss, confusion. No homicidal or suicidal ideation.  Heme: Denies bruising, bleeding, and enlarged lymph nodes.  Physical Exam: BP (!) 188/94   Pulse (!) 102   Temp (!) 97.1 F (36.2 C) (Oral)   Ht 5\' 6"  (1.676 m)   Wt 198 lb 3.2 oz (89.9 kg)   BMI 31.99 kg/m  General:   Alert and oriented. No distress noted. Pleasant and cooperative.  Head:  Normocephalic and atraumatic. Eyes:  Conjuctiva clear without scleral icterus. Mouth:  Oral mucosa pink and moist. Good dentition. No lesions. Abdomen:  +BS, soft, non-tender and non-distended. No rebound or guarding. No HSM or masses noted. Msk:  Symmetrical without gross deformities. Normal posture. Extremities:  Without edema. Neurologic:  Alert and  oriented x4 Psych:  Alert and cooperative. Normal mood and affect.

## 2018-07-31 NOTE — Assessment & Plan Note (Signed)
Doing well with Linzess 290 mcg. She will contact us with information regarding which pharmacy to send next prescription. Colonoscopy in 2024 if health permits.   As of note: BP elevated today but asymptomatic. She has not taken oral medications this morning but will do so after visit.

## 2018-08-13 ENCOUNTER — Ambulatory Visit (INDEPENDENT_AMBULATORY_CARE_PROVIDER_SITE_OTHER): Payer: Medicare Other | Admitting: Family Medicine

## 2018-08-13 ENCOUNTER — Encounter: Payer: Self-pay | Admitting: Family Medicine

## 2018-08-13 VITALS — BP 132/82 | HR 113 | Resp 12 | Ht 67.0 in | Wt 198.0 lb

## 2018-08-13 DIAGNOSIS — R1310 Dysphagia, unspecified: Secondary | ICD-10-CM

## 2018-08-13 DIAGNOSIS — I10 Essential (primary) hypertension: Secondary | ICD-10-CM

## 2018-08-13 DIAGNOSIS — E785 Hyperlipidemia, unspecified: Secondary | ICD-10-CM

## 2018-08-13 DIAGNOSIS — F17210 Nicotine dependence, cigarettes, uncomplicated: Secondary | ICD-10-CM

## 2018-08-13 DIAGNOSIS — Z0000 Encounter for general adult medical examination without abnormal findings: Secondary | ICD-10-CM

## 2018-08-13 DIAGNOSIS — R1319 Other dysphagia: Secondary | ICD-10-CM

## 2018-08-13 DIAGNOSIS — R7303 Prediabetes: Secondary | ICD-10-CM

## 2018-08-13 DIAGNOSIS — F17200 Nicotine dependence, unspecified, uncomplicated: Secondary | ICD-10-CM

## 2018-08-13 NOTE — Patient Instructions (Addendum)
Please cancel February appointment Schedule wellness with nurse due 8/6 or after  MD follow up end May, call if you need me sooner  Work on a QUIT DATE and quit smoking please  You are referred to Dr Oneida Alar ro feeling of food and liquid sticking  Fasting lipid, cmp and eGFR and hBA1C 1 week before may appointment with mD  Thank you  for choosing [facility]. We consider it a privelige to serve you.  Delivering excellent health care in a caring and  compassionate way is our goal.  Partnering with you,  so that together we can achieve this goal is our strategy.    Steps to Quit Smoking  Smoking tobacco can be bad for your health. It can also affect almost every organ in your body. Smoking puts you and people around you at risk for many serious long-lasting (chronic) diseases. Quitting smoking is hard, but it is one of the best things that you can do for your health. It is never too late to quit. What are the benefits of quitting smoking? When you quit smoking, you lower your risk for getting serious diseases and conditions. They can include:  Lung cancer or lung disease.  Heart disease.  Stroke.  Heart attack.  Not being able to have children (infertility).  Weak bones (osteoporosis) and broken bones (fractures). If you have coughing, wheezing, and shortness of breath, those symptoms may get better when you quit. You may also get sick less often. If you are pregnant, quitting smoking can help to lower your chances of having a baby of low birth weight. What can I do to help me quit smoking? Talk with your doctor about what can help you quit smoking. Some things you can do (strategies) include:  Quitting smoking totally, instead of slowly cutting back how much you smoke over a period of time.  Going to in-person counseling. You are more likely to quit if you go to many counseling sessions.  Using resources and support systems, such as: ? Database administrator with a  Social worker. ? Phone quitlines. ? Careers information officer. ? Support groups or group counseling. ? Text messaging programs. ? Mobile phone apps or applications.  Taking medicines. Some of these medicines may have nicotine in them. If you are pregnant or breastfeeding, do not take any medicines to quit smoking unless your doctor says it is okay. Talk with your doctor about counseling or other things that can help you. Talk with your doctor about using more than one strategy at the same time, such as taking medicines while you are also going to in-person counseling. This can help make quitting easier. What things can I do to make it easier to quit? Quitting smoking might feel very hard at first, but there is a lot that you can do to make it easier. Take these steps:  Talk to your family and friends. Ask them to support and encourage you.  Call phone quitlines, reach out to support groups, or work with a Social worker.  Ask people who smoke to not smoke around you.  Avoid places that make you want (trigger) to smoke, such as: ? Bars. ? Parties. ? Smoke-break areas at work.  Spend time with people who do not smoke.  Lower the stress in your life. Stress can make you want to smoke. Try these things to help your stress: ? Getting regular exercise. ? Deep-breathing exercises. ? Yoga. ? Meditating. ? Doing a body scan. To do this, close your  eyes, focus on one area of your body at a time from head to toe, and notice which parts of your body are tense. Try to relax the muscles in those areas.  Download or buy apps on your mobile phone or tablet that can help you stick to your quit plan. There are many free apps, such as QuitGuide from the [employer] Office manager for Disease Control and Prevention). You can find more support from smokefree.gov and other websites. This information is not intended to replace advice given to you by your health care provider. Make sure you discuss any questions you have with  your health care provider. Document Released: 05/05/2009 Document Revised: 03/06/2016 Document Reviewed: 11/23/2014 Elsevier Interactive Patient Education  2019 [employer].

## 2018-08-17 NOTE — Progress Notes (Signed)
    [name]     MRN: [id number]      DOB: [birth year]  HPI: Patient is in for annual physical exam. C/o chronic upper abdominal pain and this is being followed by GI, states she believes that food is sticking when she swallows, has ahd dilatation in the past, feels as though needs this Recent labs,are reviewed. Immunization is reviewed , and  Is up to date PE: BP 132/82   Pulse (!) 113   Resp 12   Ht 5\' 7"  (1.702 m)   Wt 198 lb (89.8 kg)   SpO2 98% Comment: room air  BMI 31.01 kg/m   Pleasant  female, alert and oriented x 3, in no cardio-pulmonary distress. Afebrile. HEENT No facial trauma or asymetry. Sinuses non tender.  Extra occullar muscles intact, pupils equally reactive to light. External ears normal, tympanic membranes clear. Oropharynx moist, no exudate. Neck: supple, no adenopathy,JVD or thyromegaly.No bruits.  Chest: Clear to ascultation bilaterally.No crackles or wheezes. Non tender to palpation  Breast: No asymetry,no masses or lumps. No tenderness. No nipple discharge or inversion. No axillary or supraclavicular adenopathy  Cardiovascular system; Heart sounds normal,  S1 and  S2 ,no S3.  No murmur, or thrill. Apical beat not displaced Peripheral pulses normal.  Abdomen: Soft, , no organomegaly or masses.Epigastrium and LUQ mildly tender to deep palpation, no guarding or rebound No bruits. Bowel sounds normal. No guarding, tenderness or rebound.    Musculoskeletal exam: Adequate though reduced  ROM of spine, hips , shoulders and knees. No deformity ,swelling or crepitus noted. No muscle wasting or atrophy.   Neurologic: Cranial nerves 2 to 12 intact. Power, tone ,sensation and reflexes normal throughout. No disturbance in gait. No tremor.  Skin: Intact, no ulceration, erythema , scaling or rash noted. Pigmentation normal throughout  Psych; Normal mood and affect. Judgement and concentration normal   Assessment & Plan:  Annual  physical exam Annual exam as documented. Counseling done  re healthy lifestyle involving commitment to 150 minutes exercise per week, heart healthy diet, and attaining healthy weight.The importance of adequate sleep also discussed. . Changes in health habits are decided on by the patient with goals and time frames  set for achieving them. Immunization and cancer screening needs are specifically addressed at this visit.   NICOTINE ADDICTION Asked:confirms currently smokes cigarettes Assess: Unwilling to quit but cutting back Advise: needs to QUIT to reduce risk of cancer, cardio and cerebrovascular disease Assist: counseled for 5 minutes and literature provided Arrange: follow up in 3 months   Other dysphagia C/o epigastric and LUQ discomfort and solid dysphagia, has ahd dilation in the past, refer to GI

## 2018-08-17 NOTE — Assessment & Plan Note (Signed)
Annual exam as documented. Counseling done  re healthy lifestyle involving commitment to 150 minutes exercise per week, heart healthy diet, and attaining healthy weight.The importance of adequate sleep also discussed. Changes in health habits are decided on by the patient with goals and time frames  set for achieving them. Immunization and cancer screening needs are specifically addressed at this visit. 

## 2018-08-17 NOTE — Assessment & Plan Note (Signed)
C/o epigastric and LUQ discomfort and solid dysphagia, has ahd dilation in the past, refer to GI

## 2018-08-17 NOTE — Assessment & Plan Note (Signed)
Asked:confirms currently smokes cigarettes Assess: Unwilling to quit but cutting back Advise: needs to QUIT to reduce risk of cancer, cardio and cerebrovascular disease Assist: counseled for 5 minutes and literature provided Arrange: follow up in 3 months  

## 2018-08-21 ENCOUNTER — Encounter: Payer: Self-pay | Admitting: *Deleted

## 2018-09-01 ENCOUNTER — Ambulatory Visit (INDEPENDENT_AMBULATORY_CARE_PROVIDER_SITE_OTHER): Payer: Medicare Other

## 2018-09-01 ENCOUNTER — Ambulatory Visit: Payer: Medicare Other

## 2018-09-01 DIAGNOSIS — Z111 Encounter for screening for respiratory tuberculosis: Secondary | ICD-10-CM

## 2018-09-01 NOTE — Progress Notes (Unsigned)
Patient came in today to have TB skin test administered. Test administered in left forearm. Wheal obtained. Patient advised she must come back on Wednesday around 9:30am to have test read or test would be invalid and she would have to start over with verbal understanding.

## 2018-09-01 NOTE — Progress Notes (Signed)
TB skin test administered in LEFT forearm. Wheal obtained. Patient understands she has to come back Wednesday around the same time in order to have the test read. If she doesn't she will have to start over and the test will be invalid.

## 2018-09-03 ENCOUNTER — Ambulatory Visit: Payer: Medicare Other

## 2018-09-03 DIAGNOSIS — Z111 Encounter for screening for respiratory tuberculosis: Secondary | ICD-10-CM

## 2018-09-03 LAB — TB SKIN TEST
Induration: 0 mm
TB Skin Test: NEGATIVE

## 2018-09-08 ENCOUNTER — Ambulatory Visit: Payer: Medicare Other

## 2018-09-15 ENCOUNTER — Encounter: Payer: Medicare Other | Admitting: Family Medicine

## 2018-09-22 DIAGNOSIS — M79671 Pain in right foot: Secondary | ICD-10-CM | POA: Diagnosis not present

## 2018-09-22 DIAGNOSIS — Q828 Other specified congenital malformations of skin: Secondary | ICD-10-CM | POA: Diagnosis not present

## 2018-10-09 ENCOUNTER — Telehealth: Payer: Self-pay | Admitting: Family Medicine

## 2018-10-09 NOTE — Telephone Encounter (Signed)
fluticasone (FLONASE) 50 MCG/ACT nasal spray, Please send this to the Pharmacy

## 2018-10-10 ENCOUNTER — Other Ambulatory Visit: Payer: Self-pay

## 2018-10-10 MED ORDER — FLUTICASONE PROPIONATE 50 MCG/ACT NA SUSP: 2.0000 | Freq: Every day | NASAL | 2 refills | Status: DC | PRN

## 2018-10-10 NOTE — Telephone Encounter (Signed)
Flonase refilled.

## 2018-11-28 ENCOUNTER — Encounter: Payer: Self-pay | Admitting: Family Medicine

## 2018-11-28 ENCOUNTER — Encounter: Payer: Self-pay | Admitting: *Deleted

## 2018-11-28 ENCOUNTER — Ambulatory Visit (INDEPENDENT_AMBULATORY_CARE_PROVIDER_SITE_OTHER): Payer: Medicare Other | Admitting: Family Medicine

## 2018-11-28 VITALS — BP 140/90 | HR 98 | Temp 98.5°F | Resp 12 | Ht 67.0 in | Wt 196.4 lb

## 2018-11-28 DIAGNOSIS — B373 Candidiasis of vulva and vagina: Secondary | ICD-10-CM

## 2018-11-28 DIAGNOSIS — J0140 Acute pansinusitis, unspecified: Secondary | ICD-10-CM

## 2018-11-28 DIAGNOSIS — B3731 Acute candidiasis of vulva and vagina: Secondary | ICD-10-CM

## 2018-11-28 DIAGNOSIS — F17200 Nicotine dependence, unspecified, uncomplicated: Secondary | ICD-10-CM

## 2018-11-28 MED ORDER — FLUCONAZOLE 150 MG PO TABS: 150.0000 mg | ORAL_TABLET | Freq: Every day | ORAL | 1 refills | Status: DC

## 2018-11-28 MED ORDER — AZITHROMYCIN 250 MG PO TABS: ORAL_TABLET | ORAL | 0 refills | Status: DC

## 2018-11-28 NOTE — Patient Instructions (Signed)
    Thank you for coming into the office today. I appreciate the opportunity to provide you with the care for your health and wellness. Today we discussed: Sinusitis and yeast infection  I have sent a Z-Pak and Diflucan.  Please take them both as directed.  Please take 1 Diflucan today.  I have sent in 2. _Please take one now. And you can take 1 at the end of the Z-Pak, if your symptoms return or have not subsided.  Taking the antibiotic can cause you to have a yeast infection again even with treatment.  Please let us know if this does not subside after complete course of Z-Pak and Diflucan.  I have attached some healthy vaginal information for you to review as well.  Additionally please continue to use your nasal sprays.  To help keep your nasal passages open and clear.  Please let us know if your symptoms do not subside or if they worsen.  [place] YOUR HANDS WELL AND FREQUENTLY. AVOID TOUCHING YOUR FACE, UNLESS YOUR HANDS ARE FRESHLY WASHED.  GET FRESH AIR DAILY. STAY HYDRATED WITH WATER.   It was a pleasure to see you and I look forward to continuing to work together on your health and well-being. Please do not hesitate to call the office if you need care or have questions about your care.  Have a wonderful day and week. With Gratitude, Cherly Beach, DNP, AGNP-BC   Healthy vaginal hygiene practices   -  Avoid sleeper pajamas. Nightgowns allow air to circulate.  Sleep without underpants whenever possible.  -  Wear cotton underpants during the day. Double-rinse underwear after washing to avoid residual irritants. Do not use fabric softeners for underwear and swimsuits.  - Avoid tights, leotards, leggings, "skinny" jeans, and other tight-fitting clothing. Skirts and loose-fitting pants allow air to circulate.  - Avoid pantyliners.  Instead use tampons or cotton pads.  - Daily warm bathing is helpful:     - Soak in clean water (no soap) for 10 to 15 minutes. Adding vinegar or baking  soda to the water has not been specifically studied and may not be better than clean water alone.      - Use soap to wash regions other than the genital area just before getting out of the tub. Limit use of any soap on genital areas. Use fragance-free soaps.     - Rinse the genital area well and gently pat dry.  Don't rub.  Hair dryer to assist with drying can be used only if on cool setting.     - Do not use bubble baths or perfumed soaps.  - Do not use any feminine sprays, douches or powders.  These contain chemicals that will irritate the skin.  - If the genital area is tender or swollen, cool compresses may relieve the discomfort. Unscented wet wipes can be used instead of toilet paper for wiping.   - Emollients, such as Vaseline, may help protect skin and can be applied to the irritated area.  - Always remember to wipe front-to-back after bowel movements. Pat dry after urination.  - Do not sit in wet swimsuits for long periods of time after swimming

## 2018-11-28 NOTE — Progress Notes (Signed)
Subjective:     Patient ID: [name], female   DOB: Jun 23, 1941, 78 y.o.   MRN: [id number]  [name] presents for Sinusitis (x 2 weeks) and Vaginitis  [name] is a 78 year old female patient of Dr. Griffin Dakin.  Who is well-known to the clinic.  Presents today with having ongoing sinusitis and allergy-like symptoms for 2 weeks now that of progressively worsening.  And the onset of vaginitis and yeast.  Sinus: Reports about a week or 2 ago she started having a headache and pressure along the frontal sinuses it increased to include above her Fritz [place] sinuses.  Additionally she now has some bilateral ear ache and itching.  It does hurt for her to chew at times.  She denies any drainage but feels lots of congestion.  Denies sore throat, cough, shortness of breath, chest pain, fever, chills.  Reports having slight headache and pressure being the worst symptoms.  Been using [place] saline spray and Flonase to try to help break up the pressure and stuffiness.  Neither 1 of these have relieved anything.   Vaginitis: Reports that she has some white thick clumpy discharge that is started about a week ago.  She has been trying to drink water and cranberry juice to help see if that would cleared up.  She was unsure if she had a urinary tract infection or not.  She has a lot of itching with the discharge as well.  She does report that her urine is smelling strong.  Reports that she is unable to provide a sample today in the office.  She is tried to use badges so without success as well.  Denies having an increase in urgency, frequency, pelvic pain, vaginal bleeding, vaginal pain, flank pain, fevers, chills.  Occasional burning with urination and discharge and itching are her only symptoms at this time.  Past Medical, Surgical, Social History, Allergies, and Medications have been Reviewed.   Past Medical History:  Diagnosis Date  . COPD (chronic obstructive pulmonary disease) ([place])   . Hiatal hernia  without gangrene and obstruction 03/23/2015  . Hypertension 2004  . Lung nodule seen on imaging study   . Nicotine addiction   . OA (osteoarthritis) of knee   . Obesity   . Sinusitis    Past Surgical History:  Procedure Laterality Date  . bilateral cataract surgery  7/27 & 03/01/09   Dr. Gershon Crane    . cholecystectomy    . COLONOSCOPY  04/2004   Dr. Leane Para Smith-->melanosis coli  . COLONOSCOPY WITH ESOPHAGOGASTRODUODENOSCOPY (EGD) N/A 02/25/2013   Dr. Fields:Normal mucosa in the terminal ileum/Severe melanosis throughout the entire examined colon/ONE COLON POLYP REMOVED (tubular adenoma)/Mild diverticulosis  in the sigmoid colon/EGD:The mucosa of the esophagus appeared normal/Non-erosive gastritis, empiric dilation with Savary dilator  . ESOPHAGOGASTRODUODENOSCOPY  11/07/2010   Jenkins:hiatal hernia/no evidence of Barrett esophagitis.  The Z-line was noted to be at 39 cm from the teeth. CLO test negative  . ESOPHAGOGASTRODUODENOSCOPY N/A 09/11/2013   Dr.Fields-  incomplete esophageal web in mid-esophagus, medium sized hialtal hernia, PUD. bx=focal erosion with inflammation and fibrosis  . ESOPHAGOGASTRODUODENOSCOPY N/A 07/06/2014   healed ulcers, persistent erosions on aspirin, small hiatal hernia  . knee arthroscopy right    . LEFT HEART CATHETERIZATION WITH CORONARY ANGIOGRAM N/A 04/08/2014   Procedure: LEFT HEART CATHETERIZATION WITH CORONARY ANGIOGRAM;  Surgeon: Sinclair Grooms, MD;  Location: [facility] CATH LAB;  Service: Cardiovascular;  Laterality: N/A;  . MALONEY DILATION N/A 02/25/2013  Procedure: MALONEY DILATION;  Surgeon: Danie Binder, MD;  Location: [facility] ENDO SUITE;  Service: Endoscopy;  Laterality: N/A;  . PARTIAL HYSTERECTOMY    . SAVORY DILATION N/A 02/25/2013   Procedure: SAVORY DILATION;  Surgeon: Danie Binder, MD;  Location: [facility] ENDO SUITE;  Service: Endoscopy;  Laterality: N/A;  . total knee arthroplasty right  05/29/2005   Dr. Aline Brochure    Social History   Socioeconomic History   . Marital status: Widowed    Spouse name: Not on file  . Number of children: 4  . Years of education: Not on file  . Highest education level: Not on file  Occupational History  . Occupation: employed     Fish farm manager: RETIRED  Social Needs  . Financial resource strain: Not very hard  . Food insecurity:    Worry: Sometimes true    Inability: Sometimes true  . Transportation needs:    Medical: No    Non-medical: No  Tobacco Use  . Smoking status: Current Some Day Smoker    Packs/day: 0.10    Years: 10.00    Pack years: 1.00    Types: Cigarettes    Start date: 07/23/1962  . Smokeless tobacco: Never Used  . Tobacco comment: 3 per day   Substance and Sexual Activity  . Alcohol use: No    Alcohol/week: 0.0 standard drinks  . Drug use: No  . Sexual activity: Never  Lifestyle  . Physical activity:    Days per week: 0 days    Minutes per session: 0 min  . Stress: Only a little  Relationships  . Social connections:    Talks on phone: More than three times a week    Gets together: Twice a week    Attends religious service: More than 4 times per year    Active member of club or organization: Not on file    Attends meetings of clubs or organizations: More than 4 times per year    Relationship status: Widowed  . Intimate partner violence:    Fear of current or ex partner: No    Emotionally abused: No    Physically abused: No    Forced sexual activity: No  Other Topics Concern  . Not on file  Social History Narrative  . Not on file    Outpatient Encounter Medications as of 11/28/2018  Medication Sig  . albuterol (PROVENTIL HFA;VENTOLIN HFA) 108 (90 Base) MCG/ACT inhaler Inhale 2 puffs into the lungs every 6 (six) hours as needed for wheezing or shortness of breath.  . ALOE VERA PO Take 500 mg by mouth as needed.   [place] Kitchen amLODipine (NORVASC) 10 MG tablet Take 10 mg by mouth daily.  [place] Kitchen aspirin EC 81 MG tablet Take 81 mg by mouth daily.  . Calcium Carbonate-Vitamin D (CALCIUM + D PO)  Take 1 tablet by mouth daily.  . Cholecalciferol 400 units CAPS Take 1 capsule by mouth daily.  . fluticasone (FLONASE) 50 MCG/ACT nasal spray Place 2 sprays into both nostrils daily as needed for allergies or rhinitis.  . hydrochlorothiazide (HYDRODIURIL) 25 MG tablet Take 25 mg by mouth daily.  Mar[place] Kitchen linaclotide (LINZESS) 290 MCG CAPS capsule Take 290 mcg by mouth daily before breakfast.  . pantoprazole (PROTONIX) 40 MG tablet Take 1 tablet (40 mg total) by mouth daily. 30 minutes before breakfast  . pravastatin (PRAVACHOL) 80 MG tablet Take 1 tablet (80 mg total) by mouth daily. Dose increase effective 10/07 2019  . simethicone (MYLICON) 606 MG  chewable tablet Chew 125 mg by mouth every 6 (six) hours as needed for flatulence.  . sodium chloride (OCEAN) 0.65 % SOLN nasal spray Place 1 spray into both nostrils as needed for congestion.  [place] Kitchen spironolactone (ALDACTONE) 25 MG tablet Take 1 tablet (25 mg total) by mouth daily.  [place] Kitchen azithromycin (ZITHROMAX Z-PAK) 250 MG tablet Take 2 pills today (Day 1) and take 1 pill daily for day 2-5.  . fluconazole (DIFLUCAN) 150 MG tablet Take 1 tablet (150 mg total) by mouth daily.   No facility-administered encounter medications on file as of 11/28/2018.    No Known Allergies  Review of Systems  Constitutional: Positive for fatigue. Negative for activity change, appetite change, chills and fever.  HENT: Positive for congestion, sinus pressure and sinus pain. Negative for ear pain, rhinorrhea, sore throat, trouble swallowing and voice change.        Itch ears  Eyes: Negative.  Negative for discharge, itching and visual disturbance.  Respiratory: Negative for cough, chest tightness, shortness of breath and wheezing.   Cardiovascular: Negative for chest pain, palpitations and leg swelling.  Gastrointestinal: Negative.   Endocrine: Negative.   Genitourinary: Positive for vaginal discharge. Negative for flank pain, frequency, pelvic pain, urgency, vaginal bleeding and  vaginal pain.       Burning and itching   Musculoskeletal: Negative.   Skin: Negative.   Allergic/Immunologic: Positive for environmental allergies.  Neurological: Positive for headaches. Negative for dizziness.  Hematological: Negative.   Psychiatric/Behavioral: Negative.   All other systems reviewed and are negative.      Objective:     BP (!) 146/78   Pulse 98   Temp 98.5 F (36.9 C) (Oral)   Resp 12   Ht 5\' 7"  (1.702 m)   Wt 196 lb 6.4 oz (89.1 kg)   SpO2 96%   BMI 30.76 kg/m   Physical Exam Vitals signs and nursing note reviewed.  Constitutional:      General: She is not in acute distress.    Appearance: Normal appearance. She is obese. She is not ill-appearing.  HENT:     Head: Normocephalic and atraumatic.     Right Ear: Hearing, tympanic membrane, ear canal and external ear normal.     Left Ear: Hearing, tympanic membrane, ear canal and external ear normal.     Nose: Congestion present. No rhinorrhea.     Right Turbinates: Enlarged and swollen. Not pale.     Left Turbinates: Enlarged and swollen. Not pale.     Right Sinus: Maxillary sinus tenderness and frontal sinus tenderness present.     Left Sinus: Maxillary sinus tenderness and frontal sinus tenderness present.     Mouth/Throat:     Mouth: Mucous membranes are moist.     Pharynx: Oropharynx is clear. Posterior oropharyngeal erythema present.  Eyes:     General: No scleral icterus.       Right eye: No discharge.        Left eye: No discharge.     Conjunctiva/sclera: Conjunctivae normal.  Neck:     Musculoskeletal: Normal range of motion and neck supple.  Cardiovascular:     Rate and Rhythm: Normal rate and regular rhythm.     Pulses: Normal pulses.     Heart sounds: Normal heart sounds.  Pulmonary:     Effort: Pulmonary effort is normal.  Abdominal:     General: Bowel sounds are normal.     Palpations: Abdomen is soft.  Musculoskeletal: Normal range of motion.  Skin:    General: Skin is warm  and dry.  Neurological:     Mental Status: She is alert and oriented to person, place, and time.  Psychiatric:        Mood and Affect: Mood normal.        Behavior: Behavior normal.        Thought Content: Thought content normal.        Judgment: Judgment normal.        Assessment and Plan       1. NICOTINE ADDICTION Educated on the impact of cigarette use with other illnesses such as sinusitis, colds and viruses and being more susceptible to these. Asked about quitting: confirms they are currently smokes cigarettes Advise to quit smoking: Educated about QUITTING to reduce the risk of cancer, cardio and cerebrovascular disease. Assess willingness: Unwilling to quit at this time, but is working on cutting back. Assist with counseling and pharmacotherapy: Counseled for 5 minutes and literature provided. Arrange for follow up: not quitting follow up in 3 months and continue to offer help.   2. Acute non-recurrent pansinusitis Symptoms consistent for pansinusitis.  Ongoing for 2 weeks and worsening.  Will provide with Z-Pak to help clear this up.  Advised to complete the full course of antibiotics.  Additionally advised that antibiotics might worsen her yeast infection or make it come back on.  Advised to return to the clinic if her symptoms do not subside or worsen.  Reviewed side effects, risks and benefits of medication.   Patient acknowledged agreement and understanding of the plan.   - azithromycin (ZITHROMAX Z-PAK) 250 MG tablet; Take 2 pills today (Day 1) and take 1 pill daily for day 2-5.  Dispense: 6 each; Refill: 0  3. Yeast vaginitis Educated on good vaginal hygiene.  Provided with Diflucan 2 tablets.  One that she can take now.  And one that she can take at the end of her Z-Pak in case the yeast infection return secondary to antibiotic use.  Advised to return to the clinic if her symptoms do not subside.  She is unable to provide [place] with a urine sample today for accurate  assessment.  If her symptoms do not subside and she returns to the clinic she is advised that she needs to void for [place] so that we can get a sample.   Reviewed side effects, risks and benefits of medication.   Patient acknowledged agreement and understanding of the plan.   - fluconazole (DIFLUCAN) 150 MG tablet; Take 1 tablet (150 mg total) by mouth daily.  Dispense: 2 tablet; Refill: 1  Total time 15 minutes:  time greater than 50% of total time spent doing pt counseling and coordination of care of the above diagnoses    Return if symptoms worsen or fail to improve. PRN   Perlie Mayo, DNP, AGNP-BC [address] [place], [place] [address] Office Hours: Mon-Thurs 8 am-5 pm; Fri 8 am-12 pm Office Phone:  [phone]  Office Fax: [phone]

## 2018-12-16 ENCOUNTER — Ambulatory Visit (INDEPENDENT_AMBULATORY_CARE_PROVIDER_SITE_OTHER): Payer: Medicare Other | Admitting: Family Medicine

## 2018-12-16 ENCOUNTER — Other Ambulatory Visit: Payer: Self-pay

## 2018-12-16 ENCOUNTER — Encounter: Payer: Self-pay | Admitting: Family Medicine

## 2018-12-16 VITALS — BP 140/90 | Ht 67.0 in | Wt 196.0 lb

## 2018-12-16 DIAGNOSIS — I10 Essential (primary) hypertension: Secondary | ICD-10-CM

## 2018-12-16 DIAGNOSIS — J302 Other seasonal allergic rhinitis: Secondary | ICD-10-CM

## 2018-12-16 DIAGNOSIS — F17200 Nicotine dependence, unspecified, uncomplicated: Secondary | ICD-10-CM | POA: Diagnosis not present

## 2018-12-16 DIAGNOSIS — Z1231 Encounter for screening mammogram for malignant neoplasm of breast: Secondary | ICD-10-CM

## 2018-12-16 DIAGNOSIS — E785 Hyperlipidemia, unspecified: Secondary | ICD-10-CM | POA: Diagnosis not present

## 2018-12-16 MED ORDER — MONTELUKAST SODIUM 10 MG PO TABS: 10.0000 mg | ORAL_TABLET | Freq: Every day | ORAL | 3 refills | Status: DC

## 2018-12-16 MED ORDER — BENZONATATE 100 MG PO CAPS: 100.0000 mg | ORAL_CAPSULE | Freq: Two times a day (BID) | ORAL | 0 refills | Status: DC | PRN

## 2018-12-16 NOTE — Progress Notes (Signed)
Virtual Visit via Telephone Note  I connected with [name] on 12/16/18 at  9:00 AM EDT by telephone and verified that I am speaking with the correct person using two identifiers.  Location: Patient: home Provider: office   I discussed the limitations, risks, security and privacy concerns of performing an evaluation and management service by telephone and the availability of in person appointments. I also discussed with the patient that there may be a patient responsible charge related to this service. The patient expressed understanding and agreed to proceed. This visit type is conducted due to national recommendations for restrictions regarding the COVID -19 Pandemic. Due to the patient's age and / or co morbidities, this format is felt to be most appropriate at this time without adequate follow up. The patient has no access to video technology/ had technical difficulties with video, requiring transitioning to audio format  only ( telephone ). All issues noted this document were discussed and addressed,no physical exam can be performed in this format.    History of Present Illness: 1 week h/o increased clear nasal drainage, denies fever , sore throa or cough. Drainage has thickened and is yellow, mild sinus/ nasal pressure C/o increased chest congestion and sputum Cigarettes down to 2/ day , plans to quit, no date set Denies recent fever or chills. Denies chest pains, palpitations and leg swelling Denies abdominal pain, nausea, vomiting,diarrhea or constipation.   Denies dysuria, frequency, hesitancy or incontinence. C/o chronic  joint pain, swelling and limitation in mobility. Denies headaches, seizures, numbness, or tingling. Denies depression, anxiety or insomnia. Denies skin break down or rash.       Observations/Objective: BP 140/90   Ht 5\' 7"  (1.702 m)   Wt 196 lb (88.9 kg)   BMI 30.70 kg/m  Good communication with no confusion and intact memory. Alert and  oriented x 3 No signs of respiratory distress during sppech    Assessment and Plan: Seasonal allergies Uncontrolled start daily medication , singulair, and tessalon perle for cough  Essential hypertension Controlled, no change in medication DASH diet and commitment to daily physical activity for a minimum of 30 minutes discussed and encouraged, as a part of hypertension management. The importance of attaining a healthy weight is also discussed.  BP/Weight 12/16/2018 11/28/2018 08/13/2018 07/31/2018 06/04/2018 05/28/2018 85/10/6268  Systolic BP 350 093 818 299 371 696 789  Diastolic BP 90 90 82 94 88 96 88  Wt. (Lbs) 196 196.4 198 198.2 201 199 204.12  BMI 30.7 30.76 31.01 31.99 31.48 31.17 31.97       NICOTINE ADDICTION Asked:confirms currently smokes cigarettes 3 /d ay Assess: Unwilling to quit but cutting back Advise: needs to QUIT to reduce risk of cancer, cardio and cerebrovascular disease Assist: counseled for 5 minutes and literature provided Arrange: follow up in 3 months   Hyperlipidemia LDL goal <100 Hyperlipidemia:Low fat diet discussed and encouraged.   Lipid Panel  Lab Results  Component Value Date   CHOL 242 (H) 12/17/2018   HDL 44 12/17/2018   LDLCALC 161 (H) 12/17/2018   TRIG 184 (H) 12/17/2018   CHOLHDL 4.1 04/23/2018  uncontrolled ,needs to take med prescribed and reduce fried and fatty foods    '  Follow Up Instructions:    I discussed the assessment and treatment plan with the patient. The patient was provided an opportunity to ask questions and all were answered. The patient agreed with the plan and demonstrated an understanding of the instructions.   The patient was  advised to call back or seek an in-person evaluation if the symptoms worsen or if the condition fails to improve as anticipated.  I provided 22 minutes of non-face-to-face time during this encounter.   [name], MD

## 2018-12-16 NOTE — Patient Instructions (Addendum)
F/U with MD in October , call if you need me sooner  Mammogram is being scheduled  Please keep wellness appointment  Please get fasting labs that are already ordered this week  Now down to 2  cigarettes / day, you CAN and you NEED TO quit  Medications are sent for chest congestion and for allergies, tessalon perles and singulair  It is important that you exercise regularly at least 30 minutes 5 times a week. If you develop chest pain, have severe difficulty breathing, or feel very tired, stop exercising immediately and seek medical attention  Social distancing. Frequent hand washing with soap and water Keeping your hands off of your face. These 3 practices will help to keep both you and your community healthy during this time. Please practice them faithfully!   Think about what you will eat, plan ahead. Choose " clean, green, fresh or frozen" over canned, processed or packaged foods which are more sugary, salty and fatty. 70 to 75% of food eaten should be vegetables and fruit. Three meals at set times with snacks allowed between meals, but they must be fruit or vegetables. Aim to eat over a 12 hour period , example 7 am to 7 pm, and STOP after  your last meal of the day. Drink water,generally about 64 ounces per day, no other drink is as healthy. Fruit juice is best enjoyed in a healthy way, by EATING the fruit.

## 2018-12-17 ENCOUNTER — Encounter: Payer: Self-pay | Admitting: Gastroenterology

## 2018-12-17 ENCOUNTER — Other Ambulatory Visit: Payer: Self-pay | Admitting: Family Medicine

## 2018-12-17 DIAGNOSIS — E785 Hyperlipidemia, unspecified: Secondary | ICD-10-CM | POA: Diagnosis not present

## 2018-12-17 DIAGNOSIS — R7303 Prediabetes: Secondary | ICD-10-CM | POA: Diagnosis not present

## 2018-12-17 DIAGNOSIS — I10 Essential (primary) hypertension: Secondary | ICD-10-CM | POA: Diagnosis not present

## 2018-12-18 LAB — COMPREHENSIVE METABOLIC PANEL
ALT: 10 IU/L (ref 0–32)
AST: 12 IU/L (ref 0–40)
Albumin/Globulin Ratio: 1.4 (ref 1.2–2.2)
Albumin: 4.1 g/dL (ref 3.7–4.7)
Alkaline Phosphatase: 109 IU/L (ref 39–117)
BUN/Creatinine Ratio: 8 — ABNORMAL LOW (ref 12–28)
BUN: 9 mg/dL (ref 8–27)
Bilirubin Total: 0.5 mg/dL (ref 0.0–1.2)
CO2: 27 mmol/L (ref 20–29)
Calcium: 9.3 mg/dL (ref 8.7–10.3)
Chloride: 99 mmol/L (ref 96–106)
Creatinine, Ser: 1.06 mg/dL — ABNORMAL HIGH (ref 0.57–1.00)
GFR calc Af Amer: 59 mL/min/{1.73_m2} — ABNORMAL LOW (ref >59)
GFR calc non Af Amer: 51 mL/min/{1.73_m2} — ABNORMAL LOW (ref >59)
Globulin, Total: 3 g/dL (ref 1.5–4.5)
Glucose: 104 mg/dL — ABNORMAL HIGH (ref 65–99)
Potassium: 4.6 mmol/L (ref 3.5–5.2)
Sodium: 139 mmol/L (ref 134–144)
Total Protein: 7.1 g/dL (ref 6.0–8.5)

## 2018-12-18 LAB — SPECIMEN STATUS REPORT

## 2018-12-18 LAB — LIPID PANEL W/O CHOL/HDL RATIO
Cholesterol, Total: 242 mg/dL — ABNORMAL HIGH (ref 100–199)
HDL: 44 mg/dL (ref >39)
LDL Calculated: 161 mg/dL — ABNORMAL HIGH (ref 0–99)
Triglycerides: 184 mg/dL — ABNORMAL HIGH (ref 0–149)
VLDL Cholesterol Cal: 37 mg/dL (ref 5–40)

## 2018-12-18 LAB — HGB A1C W/O EAG: Hgb A1c MFr Bld: 5.7 % — ABNORMAL HIGH (ref 4.8–5.6)

## 2018-12-21 ENCOUNTER — Encounter: Payer: Self-pay | Admitting: Family Medicine

## 2018-12-21 NOTE — Assessment & Plan Note (Signed)
Asked:confirms currently smokes cigarettes 3/day Assess: Unwilling to quit but cutting back Advise: needs to QUIT to reduce risk of cancer, cardio and cerebrovascular disease Assist: counseled for 5 minutes and literature provided Arrange: follow up in 3 months  

## 2018-12-21 NOTE — Assessment & Plan Note (Signed)
Hyperlipidemia:Low fat diet discussed and encouraged.   Lipid Panel  Lab Results  Component Value Date   CHOL 242 (H) 12/17/2018   HDL 44 12/17/2018   LDLCALC 161 (H) 12/17/2018   TRIG 184 (H) 12/17/2018   CHOLHDL 4.1 04/23/2018  uncontrolled ,needs to take med prescribed and reduce fried and fatty foods

## 2018-12-21 NOTE — Assessment & Plan Note (Signed)
Controlled, no change in medication DASH diet and commitment to daily physical activity for a minimum of 30 minutes discussed and encouraged, as a part of hypertension management. The importance of attaining a healthy weight is also discussed.  BP/Weight 12/16/2018 11/28/2018 08/13/2018 07/31/2018 06/04/2018 05/28/2018 30/0/7622  Systolic BP 633 354 562 563 893 734 287  Diastolic BP 90 90 82 94 88 96 88  Wt. (Lbs) 196 196.4 198 198.2 201 199 204.12  BMI 30.7 30.76 31.01 31.99 31.48 31.17 31.97

## 2018-12-21 NOTE — Assessment & Plan Note (Signed)
Uncontrolled start daily medication , singulair, and tessalon perle for cough

## 2019-01-29 ENCOUNTER — Ambulatory Visit: Payer: Medicare Other | Admitting: Gastroenterology

## 2019-02-20 ENCOUNTER — Ambulatory Visit (INDEPENDENT_AMBULATORY_CARE_PROVIDER_SITE_OTHER): Payer: Medicare Other | Admitting: Gastroenterology

## 2019-02-20 ENCOUNTER — Encounter: Payer: Self-pay | Admitting: Gastroenterology

## 2019-02-20 ENCOUNTER — Other Ambulatory Visit: Payer: Self-pay

## 2019-02-20 VITALS — BP 189/89 | HR 90 | Temp 97.1°F | Ht 67.0 in | Wt 197.6 lb

## 2019-02-20 DIAGNOSIS — K219 Gastro-esophageal reflux disease without esophagitis: Secondary | ICD-10-CM | POA: Diagnosis not present

## 2019-02-20 MED ORDER — PANTOPRAZOLE SODIUM 40 MG PO TBEC: 40.0000 mg | DELAYED_RELEASE_TABLET | Freq: Two times a day (BID) | ORAL | 3 refills | Status: DC

## 2019-02-20 NOTE — Progress Notes (Signed)
Referring Provider: Fayrene Helper, MD Primary Care Physician:  Fayrene Helper, MD  Chief Complaint  Patient presents with  . Constipation    HPI:   [name] is a 78 y.o. female presenting today with a history of PUD in remote past, constipation, GERD. Next colonoscopy in 2024 if benefits outweigh the risks.   Constipation: Linzess 290. Works well.   GERD: Feels regurgitation. Has to cough up yellow. States she feels a hollow feeling in her abdomen and has to get it up. When she eats/drinks, feels heavy on her stomach. Taking Protonix 30 minutes before breakfast. No SOB. No NSAIDs. Trying to lose weight. Has lost a few pounds. Jan 2019 was 207, now 196. Drinking ginger ale. 1 cup of coffee in the morning. Baking foods.   Past Medical History:  Diagnosis Date  . COPD (chronic obstructive pulmonary disease) ([place])   . Hiatal hernia without gangrene and obstruction 03/23/2015  . Hypertension 2004  . Lung nodule seen on imaging study   . Nicotine addiction   . OA (osteoarthritis) of knee   . Obesity   . Sinusitis     Past Surgical History:  Procedure Laterality Date  . bilateral cataract surgery  7/27 & 03/01/09   Dr. Gershon Crane    . cholecystectomy    . COLONOSCOPY  04/2004   Dr. Leane Para Smith-->melanosis coli  . COLONOSCOPY WITH ESOPHAGOGASTRODUODENOSCOPY (EGD) N/A 02/25/2013   Dr. Fields:Normal mucosa in the terminal ileum/Severe melanosis throughout the entire examined colon/ONE COLON POLYP REMOVED (tubular adenoma)/Mild diverticulosis  in the sigmoid colon/EGD:The mucosa of the esophagus appeared normal/Non-erosive gastritis, empiric dilation with Savary dilator  . ESOPHAGOGASTRODUODENOSCOPY  11/07/2010   Jenkins:hiatal hernia/no evidence of Barrett esophagitis.  The Z-line was noted to be at 39 cm from the teeth. CLO test negative  . ESOPHAGOGASTRODUODENOSCOPY N/A 09/11/2013   Dr.Fields-  incomplete esophageal web in mid-esophagus, medium sized hialtal hernia, PUD.  bx=focal erosion with inflammation and fibrosis  . ESOPHAGOGASTRODUODENOSCOPY N/A 07/06/2014   healed ulcers, persistent erosions on aspirin, small hiatal hernia  . knee arthroscopy right    . LEFT HEART CATHETERIZATION WITH CORONARY ANGIOGRAM N/A 04/08/2014   Procedure: LEFT HEART CATHETERIZATION WITH CORONARY ANGIOGRAM;  Surgeon: Sinclair Grooms, MD;  Location: [facility] CATH LAB;  Service: Cardiovascular;  Laterality: N/A;  . MALONEY DILATION N/A 02/25/2013   Procedure: Venia Minks DILATION;  Surgeon: Danie Binder, MD;  Location: [facility] ENDO SUITE;  Service: Endoscopy;  Laterality: N/A;  . PARTIAL HYSTERECTOMY    . SAVORY DILATION N/A 02/25/2013   Procedure: SAVORY DILATION;  Surgeon: Danie Binder, MD;  Location: [facility] ENDO SUITE;  Service: Endoscopy;  Laterality: N/A;  . total knee arthroplasty right  05/29/2005   Dr. Aline Brochure     Current Outpatient Medications  Medication Sig Dispense Refill  . albuterol (PROVENTIL HFA;VENTOLIN HFA) 108 (90 Base) MCG/ACT inhaler Inhale 2 puffs into the lungs every 6 (six) hours as needed for wheezing or shortness of breath. 1 Inhaler 1  . ALOE VERA PO Take 500 mg by mouth as needed.     Marland Kitchen amLODipine (NORVASC) 10 MG tablet Take 10 mg by mouth daily.    Marland Kitchen aspirin EC 81 MG tablet Take 81 mg by mouth daily.    . benzonatate (TESSALON) 100 MG capsule Take 1 capsule (100 mg total) by mouth 2 (two) times daily as needed for cough. 20 capsule 0  . Calcium Carbonate-Vitamin D (CALCIUM + D PO) Take 1 tablet  by mouth daily. 600 plus D chew    . Cholecalciferol 400 units CAPS Take 1 capsule by mouth daily.    . fluticasone (FLONASE) 50 MCG/ACT nasal spray Place 2 sprays into both nostrils daily as needed for allergies or rhinitis. 16 g 2  . hydrochlorothiazide (HYDRODIURIL) 25 MG tablet Take 25 mg by mouth daily.    . montelukast (SINGULAIR) 10 MG tablet Take 1 tablet (10 mg total) by mouth at bedtime. 30 tablet 3  . pantoprazole (PROTONIX) 40 MG tablet Take 1 tablet (40 mg total)  by mouth daily. 30 minutes before breakfast 90 tablet 3  . pravastatin (PRAVACHOL) 80 MG tablet Take 1 tablet (80 mg total) by mouth daily. Dose increase effective 10/07 2019 90 tablet 3  . simethicone (MYLICON) 527 MG chewable tablet Chew 125 mg by mouth every 6 (six) hours as needed for flatulence.    . sodium chloride (OCEAN) 0.65 % SOLN nasal spray Place 1 spray into both nostrils as needed for congestion.    Marland Kitchen spironolactone (ALDACTONE) 25 MG tablet Take 1 tablet (25 mg total) by mouth daily. 30 tablet 3   No current facility-administered medications for this visit.     Allergies as of 02/20/2019  . (No Known Allergies)    Family History  Problem Relation Age of Onset  . Diabetes Mother   . Hypertension Mother   . Coronary artery disease Mother   . Pneumonia Father   . Hypertension Sister   . Hypertension Sister   . Hypertension Brother        Psychologist, forensic  . Diabetes Brother   . Hypertension Brother   . Colon cancer Neg Hx   . Liver disease Neg Hx     Social History   Socioeconomic History  . Marital status: Widowed    Spouse name: Not on file  . Number of children: 4  . Years of education: Not on file  . Highest education level: Not on file  Occupational History  . Occupation: employed     Fish farm manager: RETIRED  Social Needs  . Financial resource strain: Not very hard  . Food insecurity    Worry: Sometimes true    Inability: Sometimes true  . Transportation needs    Medical: No    Non-medical: No  Tobacco Use  . Smoking status: Current Some Day Smoker    Packs/day: 0.10    Years: 10.00    Pack years: 1.00    Types: Cigarettes    Start date: 07/23/1962  . Smokeless tobacco: Never Used  . Tobacco comment: 3 per day   Substance and Sexual Activity  . Alcohol use: No    Alcohol/week: 0.0 standard drinks  . Drug use: No  . Sexual activity: Never  Lifestyle  . Physical activity    Days per week: 0 days    Minutes per session: 0 min  . Stress: Only a little   Relationships  . Social connections    Talks on phone: More than three times a week    Gets together: Twice a week    Attends religious service: More than 4 times per year    Active member of club or organization: Not on file    Attends meetings of clubs or organizations: More than 4 times per year    Relationship status: Widowed  Other Topics Concern  . Not on file  Social History Narrative  . Not on file    Review of Systems: Gen: Denies fever,  chills, anorexia. Denies fatigue, weakness, weight loss.  CV: Denies chest pain, palpitations, syncope, peripheral edema, and claudication. Resp: Denies dyspnea at rest, cough, wheezing, coughing up blood, and pleurisy. GI: see HPI Derm: Denies rash, itching, dry skin Psych: Denies depression, anxiety, memory loss, confusion. No homicidal or suicidal ideation.  Heme: Denies bruising, bleeding, and enlarged lymph nodes.  Physical Exam: BP (!) 189/89   Pulse 90   Temp (!) 97.1 F (36.2 C) (Temporal)   Ht 5\' 7"  (1.702 m)   Wt 197 lb 9.6 oz (89.6 kg)   BMI 30.95 kg/m  General:   Alert and oriented. No distress noted. Pleasant and cooperative.  Head:  Normocephalic and atraumatic. Eyes:  Conjuctiva clear without scleral icterus. Abdomen:  +BS, soft, non-tender and non-distended. No rebound or guarding. No HSM or masses noted. Neurologic:  Alert and  oriented x4 Psych:  Alert and cooperative. Normal mood and affect.

## 2019-02-20 NOTE — Patient Instructions (Addendum)
Let's increase Protonix to twice a day, 30 minutes before breakfast and dinner. I sent this to the pharmacy if needing an updated prescription.  Please call with an update in a few weeks.  Review the handout attached and make sure to follow the food choices that will keep reflux controlled.  We will see you in 3-4 months!  I enjoyed seeing you again today! As you know, I value our relationship and want to provide genuine, compassionate, and quality care. I welcome your feedback. If you receive a survey regarding your visit,  I greatly appreciate you taking time to fill this out. See you next time!  Annitta Needs, PhD, ANP-BC [facility]   Food Choices for Gastroesophageal Reflux Disease, Adult When you have gastroesophageal reflux disease (GERD), the foods you eat and your eating habits are very important. Choosing the right foods can help ease the discomfort of GERD. Consider working with a diet and nutrition specialist (dietitian) to help you make healthy food choices. What general guidelines should I follow?  Eating plan  Choose healthy foods low in fat, such as fruits, vegetables, whole grains, low-fat dairy products, and lean meat, fish, and poultry.  Eat frequent, small meals instead of three large meals each day. Eat your meals slowly, in a relaxed setting. Avoid bending over or lying down until 2-3 hours after eating.  Limit high-fat foods such as fatty meats or fried foods.  Limit your intake of oils, butter, and shortening to less than 8 teaspoons each day.  Avoid the following: ? Foods that cause symptoms. These may be different for different people. Keep a food diary to keep track of foods that cause symptoms. ? Alcohol. ? Drinking large amounts of liquid with meals. ? Eating meals during the 2-3 hours before bed.  Cook foods using methods other than frying. This may include baking, grilling, or broiling. Lifestyle  Maintain a healthy weight. Ask your  health care provider what weight is healthy for you. If you need to lose weight, work with your health care provider to do so safely.  Exercise for at least 30 minutes on 5 or more days each week, or as told by your health care provider.  Avoid wearing clothes that fit tightly around your waist and chest.  Do not use any products that contain nicotine or tobacco, such as cigarettes and e-cigarettes. If you need help quitting, ask your health care provider.  Sleep with the head of your bed raised. Use a wedge under the mattress or blocks under the bed frame to raise the head of the bed. What foods are not recommended? The items listed may not be a complete list. Talk with your dietitian about what dietary choices are best for you. Grains Pastries or quick breads with added fat. [place] toast. Vegetables Deep fried vegetables. [place] fries. Any vegetables prepared with added fat. Any vegetables that cause symptoms. For some people this may include tomatoes and tomato products, chili peppers, onions and garlic, and horseradish. Fruits Any fruits prepared with added fat. Any fruits that cause symptoms. For some people this may include citrus fruits, such as oranges, grapefruit, pineapple, and lemons. Meats and other protein foods High-fat meats, such as fatty beef or pork, hot dogs, ribs, ham, sausage, salami and bacon. Fried meat or protein, including fried fish and fried chicken. Nuts and nut butters. Dairy Whole milk and chocolate milk. Sour cream. Cream. Ice cream. Cream cheese. Milk shakes. Beverages Coffee and tea, with or without caffeine.  Carbonated beverages. Sodas. Energy drinks. Fruit juice made with acidic fruits (such as orange or grapefruit). Tomato juice. Alcoholic drinks. Fats and oils Butter. Margarine. Shortening. Ghee. Sweets and desserts Chocolate and cocoa. Donuts. Seasoning and other foods Pepper. Peppermint and spearmint. Any condiments, herbs, or seasonings that cause  symptoms. For some people, this may include curry, hot sauce, or vinegar-based salad dressings. Summary  When you have gastroesophageal reflux disease (GERD), food and lifestyle choices are very important to help ease the discomfort of GERD.  Eat frequent, small meals instead of three large meals each day. Eat your meals slowly, in a relaxed setting. Avoid bending over or lying down until 2-3 hours after eating.  Limit high-fat foods such as fatty meat or fried foods. This information is not intended to replace advice given to you by your health care provider. Make sure you discuss any questions you have with your health care provider. Document Released: 07/09/2005 Document Revised: 10/30/2018 Document Reviewed: 07/10/2016 Elsevier Patient Education  2020 [employer].

## 2019-02-23 ENCOUNTER — Other Ambulatory Visit: Payer: Self-pay

## 2019-02-23 ENCOUNTER — Ambulatory Visit (HOSPITAL_COMMUNITY)
Admission: RE | Admit: 2019-02-23 | Discharge: 2019-02-23 | Disposition: A | Discharge status: Home / Self Care | Payer: Medicare Other | Source: Ambulatory Visit | Attending: Family Medicine | Admitting: Family Medicine

## 2019-02-23 DIAGNOSIS — Z1231 Encounter for screening mammogram for malignant neoplasm of breast: Secondary | ICD-10-CM

## 2019-02-25 NOTE — Assessment & Plan Note (Signed)
78 year old female with chronic GERD, now reporting regurgitating at times and "hollow/heavy" feeling in stomach postprandially. Vague symptoms without dysphagia. Purposeful weight loss. Currently with PPI daily and denying NSAIDs. Discussed brief increase in PPI to BID, with progress report in several weeks. GERD diet discussed and provided. May need further evaluation if no improvement. Return in 3-4 months for close follow-up.

## 2019-02-26 ENCOUNTER — Other Ambulatory Visit: Payer: Self-pay

## 2019-02-26 ENCOUNTER — Encounter: Payer: Self-pay | Admitting: Family Medicine

## 2019-02-26 ENCOUNTER — Ambulatory Visit (INDEPENDENT_AMBULATORY_CARE_PROVIDER_SITE_OTHER): Payer: Medicare Other | Admitting: Family Medicine

## 2019-02-26 VITALS — BP 160/90 | HR 100 | Temp 98.2°F | Resp 12 | Ht 67.0 in | Wt 194.0 lb

## 2019-02-26 DIAGNOSIS — J0140 Acute pansinusitis, unspecified: Secondary | ICD-10-CM

## 2019-02-26 DIAGNOSIS — Z0000 Encounter for general adult medical examination without abnormal findings: Secondary | ICD-10-CM | POA: Diagnosis not present

## 2019-02-26 MED ORDER — GUAIFENESIN ER 600 MG PO TB12: 600.0000 mg | ORAL_TABLET | Freq: Two times a day (BID) | ORAL | 1 refills | Status: DC | PRN

## 2019-02-26 MED ORDER — AZITHROMYCIN 250 MG PO TABS: ORAL_TABLET | ORAL | 0 refills | Status: DC

## 2019-02-26 NOTE — Patient Instructions (Addendum)
[name] , Thank you for taking time to come for your Medicare Wellness Visit. I appreciate your ongoing commitment to your health goals. Please review the following plan we discussed and let me know if I can assist you in the future.   Please pick up your medications ans take as directed. Drink plenty of water daily. Eat plenty of fruits and veggies.  Please continue to practice social distancing to keep you, your family, and our community safe.  If you must go out, please wear a Mask and practice good handwashing.  Screening recommendations/referrals: Colonoscopy: Last was 2015 Mammogram: Completed 02/2019 Bone Density: Completed  Recommended yearly ophthalmology/optometry visit for glaucoma screening and checkup Recommended yearly dental visit for hygiene and checkup  Vaccinations: Influenza vaccine: Due Fall 2020 Pneumococcal vaccine: Completed  Tdap vaccine: Due 2020 Shingles vaccine: Insurance will not cover it  Advanced directives: report you have a Will   Conditions/risks identified: Fall  Next appointment: 04/27/2019  Preventive Care 76 Years and Older, Female Preventive care refers to lifestyle choices and visits with your health care provider that can promote health and wellness. What does preventive care include?  A yearly physical exam. This is also called an annual well check.  Dental exams once or twice a year.  Routine eye exams. Ask your health care provider how often you should have your eyes checked.  Personal lifestyle choices, including:  Daily care of your teeth and gums.  Regular physical activity.  Eating a healthy diet.  Avoiding tobacco and drug use.  Limiting alcohol use.  Practicing safe sex.  Taking low-dose aspirin every day.  Taking vitamin and mineral supplements as recommended by your health care provider. What happens during an annual well check? The services and screenings done by your health care provider during your annual  well check will depend on your age, overall health, lifestyle risk factors, and family history of disease. Counseling  Your health care provider may ask you questions about your:  Alcohol use.  Tobacco use.  Drug use.  Emotional well-being.  Home and relationship well-being.  Sexual activity.  Eating habits.  History of falls.  Memory and ability to understand (cognition).  Work and work Statistician.  Reproductive health. Screening  You may have the following tests or measurements:  Height, weight, and BMI.  Blood pressure.  Lipid and cholesterol levels. These may be checked every 5 years, or more frequently if you are over 76 years old.  Skin check.  Lung cancer screening. You may have this screening every year starting at age 76 if you have a 30-pack-year history of smoking and currently smoke or have quit within the past 15 years.  Fecal occult blood test (FOBT) of the stool. You may have this test every year starting at age 76.  Flexible sigmoidoscopy or colonoscopy. You may have a sigmoidoscopy every 5 years or a colonoscopy every 10 years starting at age 76.  Hepatitis C blood test.  Hepatitis B blood test.  Sexually transmitted disease (STD) testing.  Diabetes screening. This is done by checking your blood sugar (glucose) after you have not eaten for a while (fasting). You may have this done every 1-3 years.  Bone density scan. This is done to screen for osteoporosis. You may have this done starting at age 76.  Mammogram. This may be done every 1-2 years. Talk to your health care provider about how often you should have regular mammograms. Talk with your health care provider about your test results,  treatment options, and if necessary, the need for more tests. Vaccines  Your health care provider may recommend certain vaccines, such as:  Influenza vaccine. This is recommended every year.  Tetanus, diphtheria, and acellular pertussis (Tdap, Td) vaccine.  You may need a Td booster every 10 years.  Zoster vaccine. You may need this after age 76.  Pneumococcal 13-valent conjugate (PCV13) vaccine. One dose is recommended after age 76.  Pneumococcal polysaccharide (PPSV23) vaccine. One dose is recommended after age 76. Talk to your health care provider about which screenings and vaccines you need and how often you need them. This information is not intended to replace advice given to you by your health care provider. Make sure you discuss any questions you have with your health care provider. Document Released: 08/05/2015 Document Revised: 03/28/2016 Document Reviewed: 05/10/2015 Elsevier Interactive Patient Education  2017 [employer] Prevention in the Home Falls can cause injuries. They can happen to people of all ages. There are many things you can do to make your home safe and to help prevent falls. What can I do on the outside of my home?  Regularly fix the edges of walkways and driveways and fix any cracks.  Remove anything that might make you trip as you walk through a door, such as a raised step or threshold.  Trim any bushes or trees on the path to your home.  Use bright outdoor lighting.  Clear any walking paths of anything that might make someone trip, such as rocks or tools.  Regularly check to see if handrails are loose or broken. Make sure that both sides of any steps have handrails.  Any raised decks and porches should have guardrails on the edges.  Have any leaves, snow, or ice cleared regularly.  Use sand or salt on walking paths during winter.  Clean up any spills in your garage right away. This includes oil or grease spills. What can I do in the bathroom?  Use night lights.  Install grab bars by the toilet and in the tub and shower. Do not use towel bars as grab bars.  Use non-skid mats or decals in the tub or shower.  If you need to sit down in the shower, use a plastic, non-slip stool.  Keep the  floor dry. Clean up any water that spills on the floor as soon as it happens.  Remove soap buildup in the tub or shower regularly.  Attach bath mats securely with double-sided non-slip rug tape.  Do not have throw rugs and other things on the floor that can make you trip. What can I do in the bedroom?  Use night lights.  Make sure that you have a light by your bed that is easy to reach.  Do not use any sheets or blankets that are too big for your bed. They should not hang down onto the floor.  Have a firm chair that has side arms. You can use this for support while you get dressed.  Do not have throw rugs and other things on the floor that can make you trip. What can I do in the kitchen?  Clean up any spills right away.  Avoid walking on wet floors.  Keep items that you use a lot in easy-to-reach places.  If you need to reach something above you, use a strong step stool that has a grab bar.  Keep electrical cords out of the way.  Do not use floor polish or wax that makes floors  slippery. If you must use wax, use non-skid floor wax.  Do not have throw rugs and other things on the floor that can make you trip. What can I do with my stairs?  Do not leave any items on the stairs.  Make sure that there are handrails on both sides of the stairs and use them. Fix handrails that are broken or loose. Make sure that handrails are as long as the stairways.  Check any carpeting to make sure that it is firmly attached to the stairs. Fix any carpet that is loose or worn.  Avoid having throw rugs at the top or bottom of the stairs. If you do have throw rugs, attach them to the floor with carpet tape.  Make sure that you have a light switch at the top of the stairs and the bottom of the stairs. If you do not have them, ask someone to add them for you. What else can I do to help prevent falls?  Wear shoes that:  Do not have high heels.  Have rubber bottoms.  Are comfortable and fit  you well.  Are closed at the toe. Do not wear sandals.  If you use a stepladder:  Make sure that it is fully opened. Do not climb a closed stepladder.  Make sure that both sides of the stepladder are locked into place.  Ask someone to hold it for you, if possible.  Clearly mark and make sure that you can see:  Any grab bars or handrails.  First and last steps.  Where the edge of each step is.  Use tools that help you move around (mobility aids) if they are needed. These include:  Canes.  Walkers.  Scooters.  Crutches.  Turn on the lights when you go into a dark area. Replace any light bulbs as soon as they burn out.  Set up your furniture so you have a clear path. Avoid moving your furniture around.  If any of your floors are uneven, fix them.  If there are any pets around you, be aware of where they are.  Review your medicines with your doctor. Some medicines can make you feel dizzy. This can increase your chance of falling. Ask your doctor what other things that you can do to help prevent falls. This information is not intended to replace advice given to you by your health care provider. Make sure you discuss any questions you have with your health care provider. Document Released: 05/05/2009 Document Revised: 12/15/2015 Document Reviewed: 08/13/2014 Elsevier Interactive Patient Education  2017 [employer].

## 2019-02-26 NOTE — Progress Notes (Signed)
Subjective:   [name] is a 78 y.o. female who presents for Medicare Annual (Subsequent) preventive examination.   She also has complaints of sinus trouble.  Sinus drainage started 1 week back. Increased to pressure, headache, increased drainage, and facial pain. Tried OTC cold medication, BP is high in office today, secondary to this most likely. Also a bit tachy. No fever or chills. Does have history of chronic infections. Is a current smoker. Denies exposed to [place]. Denies shortness of breath.   Past Medical, Surgical, Social History, Allergies, and Medications have been Reviewed.   Review of Systems:   Review of Systems  Constitutional: Negative for activity change, appetite change, chills, fatigue and fever.  HENT: Positive for congestion, sinus pressure and sinus pain. Negative for ear discharge, ear pain, rhinorrhea, sore throat, trouble swallowing and voice change.        Drainage nasal and cough: yellow   Eyes: Negative.  Negative for discharge, itching and visual disturbance.  Respiratory: Positive for cough. Negative for chest tightness, shortness of breath and wheezing.   Cardiovascular: Negative for chest pain, palpitations and leg swelling.  Gastrointestinal: Negative.   Endocrine: Negative.   Genitourinary: Negative.   Musculoskeletal: Negative.   Skin: Negative.   Allergic/Immunologic: Negative.   Neurological: Positive for headaches. Negative for dizziness.  Hematological: Negative.   Psychiatric/Behavioral: Negative.   All other systems reviewed and are negative.      Cardiac Risk Factors include: advanced age (>39men, >38 women);hypertension;dyslipidemia;smoking/ tobacco exposure;obesity (BMI >30kg/m2)     Objective:     Vitals: BP (!) 160/90   Pulse 100   Temp 98.2 F (36.8 C) (Oral)   Resp 12   Ht 5\' 7"  (1.702 m)   Wt 194 lb 0.6 oz (88 kg)   SpO2 94%   BMI 30.39 kg/m   Body mass index is 30.39 kg/m.  Advanced Directives 02/24/2018  12/03/2016 02/20/2016 07/06/2014 04/08/2014 09/11/2013 02/25/2013  Does Patient Have a Medical Advance Directive? No No No No No Patient does not have advance directive;Patient would not like information Patient has advance directive, copy not in chart  Type of Advance Directive - - - - - - Living will;Healthcare Power of Attorney  Does patient want to make changes to medical advance directive? Yes (ED - Information included in AVS) - - - - - -  Copy of Healthcare Power of Attorney in Chart? - - - - - - Copy requested from family  Would patient like information on creating a medical advance directive? - Yes (MAU/Ambulatory/Procedural Areas - Information given) Yes - Educational materials given No - patient declined information No - patient declined information - -  Pre-existing out of facility DNR order (yellow form or pink MOST form) - - - - - No No    Tobacco Social History   Tobacco Use  Smoking Status Current Some Day Smoker  . Packs/day: 0.10  . Years: 10.00  . Pack years: 1.00  . Types: Cigarettes  . Start date: 07/23/1962  Smokeless Tobacco Never Used  Tobacco Comment   3 per day      Ready to quit: Yes Counseling given: Yes Comment: 3 per day    Clinical Intake:  Pre-visit preparation completed: Yes  Pain : No/denies pain Pain Score: 0-No pain     BMI - recorded: 30.39 Nutritional Status: BMI > 30  Obese Nutritional Risks: None Diabetes: No  How often do you need to have someone help you when you read instructions,  pamphlets, or other written materials from your doctor or pharmacy?: 1 - Never What is the last grade level you completed in school?: 11  Interpreter Needed?: No     Past Medical History:  Diagnosis Date  . COPD (chronic obstructive pulmonary disease) ([place])   . Hiatal hernia without gangrene and obstruction 03/23/2015  . Hypertension 2004  . Lung nodule seen on imaging study   . Nicotine addiction   . OA (osteoarthritis) of knee   . Obesity   .  Sinusitis    Past Surgical History:  Procedure Laterality Date  . bilateral cataract surgery  7/27 & 03/01/09   Dr. Gershon Crane    . cholecystectomy    . COLONOSCOPY  04/2004   Dr. Leane Para Smith-->melanosis coli  . COLONOSCOPY WITH ESOPHAGOGASTRODUODENOSCOPY (EGD) N/A 02/25/2013   Dr. Fields:Normal mucosa in the terminal ileum/Severe melanosis throughout the entire examined colon/ONE COLON POLYP REMOVED (tubular adenoma)/Mild diverticulosis  in the sigmoid colon/EGD:The mucosa of the esophagus appeared normal/Non-erosive gastritis, empiric dilation with Savary dilator  . ESOPHAGOGASTRODUODENOSCOPY  11/07/2010   Jenkins:hiatal hernia/no evidence of Barrett esophagitis.  The Z-line was noted to be at 39 cm from the teeth. CLO test negative  . ESOPHAGOGASTRODUODENOSCOPY N/A 09/11/2013   Dr.Fields-  incomplete esophageal web in mid-esophagus, medium sized hialtal hernia, PUD. bx=focal erosion with inflammation and fibrosis  . ESOPHAGOGASTRODUODENOSCOPY N/A 07/06/2014   healed ulcers, persistent erosions on aspirin, small hiatal hernia  . knee arthroscopy right    . LEFT HEART CATHETERIZATION WITH CORONARY ANGIOGRAM N/A 04/08/2014   Procedure: LEFT HEART CATHETERIZATION WITH CORONARY ANGIOGRAM;  Surgeon: Sinclair Grooms, MD;  Location: [facility] CATH LAB;  Service: Cardiovascular;  Laterality: N/A;  . MALONEY DILATION N/A 02/25/2013   Procedure: Venia Minks DILATION;  Surgeon: Danie Binder, MD;  Location: [facility] ENDO SUITE;  Service: Endoscopy;  Laterality: N/A;  . PARTIAL HYSTERECTOMY    . SAVORY DILATION N/A 02/25/2013   Procedure: SAVORY DILATION;  Surgeon: Danie Binder, MD;  Location: [facility] ENDO SUITE;  Service: Endoscopy;  Laterality: N/A;  . total knee arthroplasty right  05/29/2005   Dr. Aline Brochure    Family History  Problem Relation Age of Onset  . Diabetes Mother   . Hypertension Mother   . Coronary artery disease Mother   . Pneumonia Father   . Hypertension Sister   . Hypertension Sister   . Hypertension  Brother        Psychologist, forensic  . Diabetes Brother   . Hypertension Brother   . Colon cancer Neg Hx   . Liver disease Neg Hx    Social History   Socioeconomic History  . Marital status: Widowed    Spouse name: Not on file  . Number of children: 4  . Years of education: Not on file  . Highest education level: Not on file  Occupational History  . Occupation: employed     Fish farm manager: RETIRED  Social Needs  . Financial resource strain: Not very hard  . Food insecurity    Worry: Sometimes true    Inability: Sometimes true  . Transportation needs    Medical: No    Non-medical: No  Tobacco Use  . Smoking status: Current Some Day Smoker    Packs/day: 0.10    Years: 10.00    Pack years: 1.00    Types: Cigarettes    Start date: 07/23/1962  . Smokeless tobacco: Never Used  . Tobacco comment: 3 per day   Substance and Sexual Activity  .  Alcohol use: No    Alcohol/week: 0.0 standard drinks  . Drug use: No  . Sexual activity: Never  Lifestyle  . Physical activity    Days per week: 0 days    Minutes per session: 0 min  . Stress: Only a little  Relationships  . Social connections    Talks on phone: More than three times a week    Gets together: Twice a week    Attends religious service: More than 4 times per year    Active member of club or organization: Not on file    Attends meetings of clubs or organizations: More than 4 times per year    Relationship status: Widowed  Other Topics Concern  . Not on file  Social History Narrative  . Not on file    Outpatient Encounter Medications as of 02/26/2019  Medication Sig  . albuterol (PROVENTIL HFA;VENTOLIN HFA) 108 (90 Base) MCG/ACT inhaler Inhale 2 puffs into the lungs every 6 (six) hours as needed for wheezing or shortness of breath.  . ALOE VERA PO Take 500 mg by mouth as needed.   Marland Kitchen amLODipine (NORVASC) 10 MG tablet Take 10 mg by mouth daily.  Marland Kitchen aspirin EC 81 MG tablet Take 81 mg by mouth daily.  . benzonatate (TESSALON) 100 MG  capsule Take 1 capsule (100 mg total) by mouth 2 (two) times daily as needed for cough.  . Calcium Carbonate-Vitamin D (CALCIUM + D PO) Take 1 tablet by mouth daily. 600 plus D chew  . Cholecalciferol 400 units CAPS Take 1 capsule by mouth daily.  . fluticasone (FLONASE) 50 MCG/ACT nasal spray Place 2 sprays into both nostrils daily as needed for allergies or rhinitis.  . hydrochlorothiazide (HYDRODIURIL) 25 MG tablet Take 25 mg by mouth daily.  . montelukast (SINGULAIR) 10 MG tablet Take 1 tablet (10 mg total) by mouth at bedtime.  . pantoprazole (PROTONIX) 40 MG tablet Take 1 tablet (40 mg total) by mouth 2 (two) times daily before a meal.  . pravastatin (PRAVACHOL) 80 MG tablet Take 1 tablet (80 mg total) by mouth daily. Dose increase effective 10/07 2019  . simethicone (MYLICON) 631 MG chewable tablet Chew 125 mg by mouth every 6 (six) hours as needed for flatulence.  . sodium chloride (OCEAN) 0.65 % SOLN nasal spray Place 1 spray into both nostrils as needed for congestion.  Marland Kitchen spironolactone (ALDACTONE) 25 MG tablet Take 1 tablet (25 mg total) by mouth daily.   No facility-administered encounter medications on file as of 02/26/2019.     Activities of Daily Living In your present state of health, do you have any difficulty performing the following activities: 02/26/2019  Hearing? N  Vision? N  Difficulty concentrating or making decisions? N  Walking or climbing stairs? N  Dressing or bathing? N  Doing errands, shopping? N  Preparing Food and eating ? N  Using the Toilet? N  In the past six months, have you accidently leaked urine? N  Do you have problems with loss of bowel control? N  Managing your Medications? N  Managing your Finances? N  Housekeeping or managing your Housekeeping? N  Some recent data might be hidden    Patient Care Team: Fayrene Helper, MD as PCP - General Herminio Commons, MD as Attending Physician (Cardiology) Danie Binder, MD as Consulting  Physician (Gastroenterology)    Assessment:   This is a routine wellness examination for [facility]  Physical Exam Vitals signs reviewed.  Constitutional:  General: She is not in acute distress.    Appearance: Normal appearance. She is obese. She is not ill-appearing or toxic-appearing.  HENT:     Head: Normocephalic and atraumatic.     Right Ear: Hearing, tympanic membrane, ear canal and external ear normal.     Left Ear: Hearing, tympanic membrane, ear canal and external ear normal.     Nose: Congestion present. No rhinorrhea.     Right Turbinates: Enlarged.     Left Turbinates: Enlarged.     Right Sinus: Maxillary sinus tenderness present.     Left Sinus: Maxillary sinus tenderness present.     Mouth/Throat:     Mouth: Mucous membranes are moist.     Pharynx: Posterior oropharyngeal erythema present.  Eyes:     General: Lids are normal.        Right eye: No discharge.        Left eye: No discharge.     Conjunctiva/sclera:     Right eye: Right conjunctiva is injected.     Left eye: Left conjunctiva is injected.  Neck:     Musculoskeletal: Normal range of motion and neck supple.  Cardiovascular:     Rate and Rhythm: Regular rhythm. Tachycardia present.     Pulses: Normal pulses.     Heart sounds: Normal heart sounds.  Pulmonary:     Effort: Pulmonary effort is normal.     Breath sounds: Normal breath sounds.  Musculoskeletal: Normal range of motion.  Lymphadenopathy:     Cervical: No cervical adenopathy.  Skin:    General: Skin is warm.  Neurological:     General: No focal deficit present.     Mental Status: She is alert and oriented to person, place, and time.  Psychiatric:        Mood and Affect: Mood normal.        Behavior: Behavior normal.        Thought Content: Thought content normal.        Judgment: Judgment normal.      Exercise Activities and Dietary recommendations Current Exercise Habits: The patient does not participate in regular exercise at  present, Exercise limited by: None identified  Goals    . Increase physical activity     Try walking at least 3 days per week for 20-30 mins at a time     . Quit smoking / using tobacco       Fall Risk Fall Risk  02/26/2019 12/16/2018 11/28/2018 08/13/2018 06/04/2018  Falls in the past year? 0 0 0 0 0  Number falls in past yr: - 0 - 0 0  Injury with Fall? 0 0 0 0 0   Is the patient's home free of loose throw rugs in walkways, pet beds, electrical cords, etc?   yes      Grab bars in the bathroom? no      Handrails on the stairs?   yes      Adequate lighting?   yes     Depression Screen PHQ 2/9 Scores 02/26/2019 11/28/2018 08/13/2018 06/04/2018  PHQ - 2 Score 0 0 0 0  PHQ- 9 Score - - - -     Cognitive Function     6CIT Screen 02/26/2019 02/24/2018 12/03/2016  What Year? 0 points 0 points 0 points  What month? 0 points 0 points 0 points  What time? 0 points 0 points 0 points  Count back from 20 0 points 2 points 0 points  Months in reverse  2 points 2 points 0 points  Repeat phrase 0 points 2 points 0 points  Total Score 2 6 0    Immunization History  Administered Date(s) Administered  . H1N1 06/21/2008  . Influenza Split 05/20/2012  . Influenza Whole 05/07/2007, 04/19/2008, 05/09/2010, 03/30/2011  . Influenza, High Dose Seasonal PF 06/04/2018  . Influenza,inj,Quad PF,6+ Mos 04/29/2013, 06/15/2014, 05/24/2015, 06/11/2016, 05/06/2017  . PPD Test 09/01/2018, 09/01/2018  . Pneumococcal Conjugate-13 02/16/2014  . Pneumococcal Polysaccharide-23 04/19/2008  . Td 07/24/1995, 03/17/2009    Qualifies for Shingles Vaccine? Insurance will not pay  Screening Tests Health Maintenance  Topic Date Due  . INFLUENZA VACCINE  02/21/2019  . TETANUS/TDAP  03/18/2019  . DEXA SCAN  Completed  . PNA vac Low Risk Adult  Completed    Cancer Screenings: Lung: Low Dose CT Chest recommended if Age 73-80 years, 30 pack-year currently smoking OR have quit w/in 15years. Patient does qualify.  Breast:  Up to date on Mammogram? Yes   Up to date of Bone Density/Dexa? Yes Colorectal:  Last one was 2015  Additional Screenings:   Hepatitis C Screening: can be added to next labs      Plan:         1. Encounter for Medicare annual wellness exam   I have personally reviewed and noted the following in the patient's chart:   . Medical and social history . Use of alcohol, tobacco or illicit drugs  . Current medications and supplements . Functional ability and status . Nutritional status . Physical activity . Advanced directives . List of other physicians . Hospitalizations, surgeries, and ER visits in previous 12 months . Vitals . Screenings to include cognitive, depression, and falls . Referrals and appointments  In addition, I have reviewed and discussed with patient certain preventive protocols, quality metrics, and best practice recommendations. A written personalized care plan for preventive services as well as general preventive health recommendations were provided to patient.      2. Acute non-recurrent pansinusitis S&S today are consistent with pansinusitis. No fever. O2 sats on lower end, but is current smoker. Advised to stop smoking. Provided tx today. Instructed to stop taking OTC as I think that is causing BP to elevate. Advised to complete the full course of antibiotics. Advised to return to the clinic if her symptoms do not subside or worsen. Also reviewed shortness of breath and resp changes and the need to go the ED vs calling office.  Reviewed side effects, risks and benefits of medication.   Patient acknowledged agreement and understanding of the plan.   - azithromycin (ZITHROMAX) 250 MG tablet; Take 2 tablets (500 mg) on the first day. Then take 1 tablet (250mg ) on days 2-5.  Dispense: 6 tablet; Refill: 0 - guaiFENesin (MUCINEX) 600 MG 12 hr tablet; Take 1 tablet (600 mg total) by mouth 2 (two) times daily as needed for to loosen phlegm.  Dispense:  28 tablet; Refill: [place], NP  02/26/2019

## 2019-02-27 ENCOUNTER — Ambulatory Visit: Payer: Medicare Other

## 2019-03-02 ENCOUNTER — Ambulatory Visit: Payer: Medicare Other

## 2019-03-02 ENCOUNTER — Telehealth: Payer: Self-pay | Admitting: *Deleted

## 2019-03-02 ENCOUNTER — Other Ambulatory Visit: Payer: Self-pay

## 2019-03-02 MED ORDER — HYDROCHLOROTHIAZIDE 25 MG PO TABS: 25.0000 mg | ORAL_TABLET | Freq: Every day | ORAL | 3 refills | Status: DC

## 2019-03-02 MED ORDER — AMLODIPINE BESYLATE 10 MG PO TABS: 10.0000 mg | ORAL_TABLET | Freq: Every day | ORAL | 3 refills | Status: DC

## 2019-03-02 NOTE — Telephone Encounter (Signed)
meds refilled 

## 2019-03-02 NOTE — Telephone Encounter (Signed)
Pt called needing a refill on her bp medication. She needs the amlodopine and the hydrocholorothiazide. She needs this sent to [facility]

## 2019-04-16 ENCOUNTER — Other Ambulatory Visit: Payer: Self-pay

## 2019-04-16 ENCOUNTER — Encounter (INDEPENDENT_AMBULATORY_CARE_PROVIDER_SITE_OTHER): Payer: Self-pay

## 2019-04-16 ENCOUNTER — Encounter: Payer: Self-pay | Admitting: Family Medicine

## 2019-04-16 ENCOUNTER — Ambulatory Visit (INDEPENDENT_AMBULATORY_CARE_PROVIDER_SITE_OTHER): Payer: Medicare Other | Admitting: Family Medicine

## 2019-04-16 DIAGNOSIS — F17200 Nicotine dependence, unspecified, uncomplicated: Secondary | ICD-10-CM | POA: Diagnosis not present

## 2019-04-16 DIAGNOSIS — B373 Candidiasis of vulva and vagina: Secondary | ICD-10-CM | POA: Diagnosis not present

## 2019-04-16 DIAGNOSIS — J0111 Acute recurrent frontal sinusitis: Secondary | ICD-10-CM | POA: Diagnosis not present

## 2019-04-16 DIAGNOSIS — B3731 Acute candidiasis of vulva and vagina: Secondary | ICD-10-CM

## 2019-04-16 MED ORDER — FLUCONAZOLE 150 MG PO TABS: 150.0000 mg | ORAL_TABLET | Freq: Every day | ORAL | 1 refills | Status: DC

## 2019-04-16 MED ORDER — AZITHROMYCIN 250 MG PO TABS: ORAL_TABLET | ORAL | 0 refills | Status: DC

## 2019-04-16 NOTE — Patient Instructions (Addendum)
    I appreciate the opportunity to provide you with the care for your health and wellness. Today we discussed: sinus/chest cold  Follow up: as currently scheduled with Dr. Moshe Cipro on Monday, October 5.  No labs or referrals today  Please take them both as directed. Additionally please continue to use your nasal sprays.  To help keep your nasal passages open and clear.  Please let us know if your symptoms do not subside or if they worsen.  Please continue to practice social distancing to keep you, your family, and our community safe.  If you must go out, please wear a mask and practice good handwashing.  [place] YOUR HANDS WELL AND FREQUENTLY. AVOID TOUCHING YOUR FACE, UNLESS YOUR HANDS ARE FRESHLY WASHED.  GET FRESH AIR DAILY. STAY HYDRATED WITH WATER.   It was a pleasure to see you and I look forward to continuing to work together on your health and well-being. Please do not hesitate to call the office if you need care or have questions about your care.  Have a wonderful day and week. With Gratitude, Cherly Beach, DNP, AGNP-BC

## 2019-04-16 NOTE — Progress Notes (Signed)
Virtual Visit via Telephone Note   This visit type was conducted due to national recommendations for restrictions regarding the COVID-19 Pandemic (e.g. social distancing) in an effort to limit this patient's exposure and mitigate transmission in our community.  Due to her co-morbid illnesses, this patient is at least at moderate risk for complications without adequate follow up.  This format is felt to be most appropriate for this patient at this time.  The patient did not have access to video technology/had technical difficulties with video requiring transitioning to audio format only (telephone).  All issues noted in this document were discussed and addressed.  No physical exam could be performed with this format.    Evaluation Performed:  Follow-up visit  Date:  04/16/2019   ID:  [name], [place] Oct 17, 1940, MRN [id number]  Patient Location: Home Provider Location: Office  Location of Patient: Home Location of Provider: Telehealth Consent was obtain for visit to be over via telehealth. I verified that I am speaking with the correct person using two identifiers.  PCP:  Fayrene Helper, MD   Chief Complaint:  sick  History of Present Illness:    [name] is a 78 y.o. female with history of COPD, nicotine addiction, obesity, chronic sinusitis several times a year.  Presents today for a week and a half of sinusitis.  She is well-known to the clinic she is a patient of Dr. Griffin Dakin.  She reports thick yellow mucus that is being coughed up and having some drainage from the back of her throat.  Allergy-like symptoms for 2 weeks now.  Reports that she has this several times a year.  She reports she has pressure along the frontal sinuses.  And tenderness all around her eyes.  She has some ear achiness.  Denies having any swallowing difficulties.  Denies having any stuffiness.  Denies having shortness of breath, chest pain, fever or chills.  Denies having any headache at this  time.  But does have some pressure in the forehead.  Been using Ocean spray and Flonase that she was previously given but it is not helping at this time.  Just keeps producing thick mucus that is yellow and has a bad smell when she coughs.  The patient does not have symptoms concerning for COVID-19 infection (fever, chills, cough, or new shortness of breath).   Past Medical, Surgical, Social History, Allergies, and Medications have been Reviewed.   Past Medical History:  Diagnosis Date   COPD (chronic obstructive pulmonary disease) ([place])    Hiatal hernia without gangrene and obstruction 03/23/2015   Hypertension 2004   Lung nodule seen on imaging study    Nicotine addiction    OA (osteoarthritis) of knee    Obesity    Sinusitis    Past Surgical History:  Procedure Laterality Date   bilateral cataract surgery  7/27 & 03/01/09   Dr. Gershon Crane     cholecystectomy     COLONOSCOPY  04/2004   Dr. Leane Para Smith-->melanosis coli   COLONOSCOPY WITH ESOPHAGOGASTRODUODENOSCOPY (EGD) N/A 02/25/2013   Dr. Fields:Normal mucosa in the terminal ileum/Severe melanosis throughout the entire examined colon/ONE COLON POLYP REMOVED (tubular adenoma)/Mild diverticulosis  in the sigmoid colon/EGD:The mucosa of the esophagus appeared normal/Non-erosive gastritis, empiric dilation with Savary dilator   ESOPHAGOGASTRODUODENOSCOPY  11/07/2010   Jenkins:hiatal hernia/no evidence of Barrett esophagitis.  The Z-line was noted to be at 39 cm from the teeth. CLO test negative   ESOPHAGOGASTRODUODENOSCOPY N/A 09/11/2013  Dr.Fields-  incomplete esophageal web in mid-esophagus, medium sized hialtal hernia, PUD. bx=focal erosion with inflammation and fibrosis   ESOPHAGOGASTRODUODENOSCOPY N/A 07/06/2014   healed ulcers, persistent erosions on aspirin, small hiatal hernia   knee arthroscopy right     LEFT HEART CATHETERIZATION WITH CORONARY ANGIOGRAM N/A 04/08/2014   Procedure: LEFT HEART CATHETERIZATION WITH  CORONARY ANGIOGRAM;  Surgeon: Sinclair Grooms, MD;  [facility] LAB;  Service: Cardiovascular;  Laterality: N/A;   MALONEY DILATION N/A 02/25/2013   Procedure: Venia Minks DILATION;  Surgeon: Danie Binder, MD;  Location: [facility] ENDO SUITE;  Service: Endoscopy;  Laterality: N/A;   PARTIAL HYSTERECTOMY     SAVORY DILATION N/A 02/25/2013   Procedure: SAVORY DILATION;  Surgeon: Danie Binder, MD;  Location: [facility] ENDO SUITE;  Service: Endoscopy;  Laterality: N/A;   total knee arthroplasty right  05/29/2005   Dr. Aline Brochure      No outpatient medications have been marked as taking for the 04/16/19 encounter (Office Visit) with Perlie Mayo, NP.     Allergies:   Patient has no known allergies.   Social History   Tobacco Use   Smoking status: Current Some Day Smoker    Packs/day: 0.10    Years: 10.00    Pack years: 1.00    Types: Cigarettes    Start date: 07/23/1962   Smokeless tobacco: Never Used   Tobacco comment: 3 per day   Substance Use Topics   Alcohol use: No    Alcohol/week: 0.0 standard drinks   Drug use: No     Family Hx: The patient's family history includes Coronary artery disease in her mother; Diabetes in her brother and mother; Hypertension in her brother, brother, mother, sister, and sister; Pneumonia in her father. There is no history of Colon cancer or Liver disease.  ROS:   Please see the history of present illness.    All other systems reviewed and are negative.   Labs/Other Tests and Data Reviewed:    Recent Labs: 05/30/2018: Hemoglobin 13.0; Platelets 293; TSH 0.847 12/17/2018: ALT 10; BUN 9; Creatinine, Ser 1.06; Potassium 4.6; Sodium 139   Recent Lipid Panel Lab Results  Component Value Date/Time   CHOL 242 (H) 12/17/2018 09:11 AM   TRIG 184 (H) 12/17/2018 09:11 AM   HDL 44 12/17/2018 09:11 AM   CHOLHDL 4.1 04/23/2018 11:34 AM   CHOLHDL 4.1 07/04/2016 12:04 PM   LDLCALC 161 (H) 12/17/2018 09:11 AM    Wt Readings from Last 3 Encounters:    02/26/19 194 lb 0.6 oz (88 kg)  02/20/19 197 lb 9.6 oz (89.6 kg)  12/16/18 196 lb (88.9 kg)     Objective:    Vital Signs:  There were no vitals taken for this visit.  Limited PE due to phone visit  GEN:  Alert and oriented RESPIRATORY:  No shortness of breath noted in conversation.  Some coughing heard PSYCH:  Normal affect, mood good communication  ASSESSMENT & PLAN:    1. Acute recurrent frontal sinusitis Symptoms are consistent with the start of sinusitis, chest congestion.  Ongoing for a week and a half now.  Will provide Z-Pak to help clear this up.  Advised patient to take the full course of antibiotics.  Additionally advised that antibiotics might make yeast infection worse.  Provided her with Diflucan to help with this.  Advised to return back to the clinic if her symptoms do not subside or worsen.  Reviewed side effects, risks and benefits of medication.  Patient acknowledged agreement and understanding of the plan.   - azithromycin (ZITHROMAX) 250 MG tablet; Take 2 tablets (500 mg) on the first day. Then take 1 tablet (250mg ) on days 2-5.  Dispense: 6 tablet; Refill: 0   2. Yeast vaginitis Educated on good vaginal hygiene.  Reports that she gets yeast infections off and on throughout the year.  Advised that I will send in Diflucan at this time to help since antibiotic will make this worse.  Advised on how to take this appropriately.  - fluconazole (DIFLUCAN) 150 MG tablet; Take 1 tablet (150 mg total) by mouth daily.  Dispense: 2 tablet; Refill: 1  3. NICOTINE ADDICTION  Asked about quitting: confirms they are currently smokes cigarettes Advise to quit smoking: Educated about QUITTING to reduce the risk of cancer, cardio and cerebrovascular disease. Assess willingness: Unwilling to quit at this time, but is working on cutting back. Assist with counseling and pharmacotherapy: Counseled for 5 minutes and literature provided. Arrange for follow up:  not quitting follow  up in 3 months and continue to offer help.   Time:   Today, I have spent 10 minutes with the patient with telehealth technology discussing the above problems.     Medication Adjustments/Labs and Tests Ordered: Current medicines are reviewed at length with the patient today.  Concerns regarding medicines are outlined above.   Tests Ordered: No orders of the defined types were placed in this encounter.   Medication Changes: Meds ordered this encounter  Medications   fluconazole (DIFLUCAN) 150 MG tablet    Sig: Take 1 tablet (150 mg total) by mouth daily.    Dispense:  2 tablet    Refill:  1    Order Specific Question:   Supervising Provider    Answer:   SIMPSON, MARGARET E [[id number]]   azithromycin (ZITHROMAX) 250 MG tablet    Sig: Take 2 tablets (500 mg) on the first day. Then take 1 tablet (250mg ) on days 2-5.    Dispense:  6 tablet    Refill:  0    Order Specific Question:   Supervising Provider    Answer:   Fayrene Helper [id number]    Disposition:  Follow up 04/27/2019   Signed, Perlie Mayo, NP  04/16/2019 2:33 PM     [facility]

## 2019-04-27 ENCOUNTER — Other Ambulatory Visit: Payer: Self-pay

## 2019-04-27 ENCOUNTER — Encounter: Payer: Self-pay | Admitting: Family Medicine

## 2019-04-27 ENCOUNTER — Ambulatory Visit (INDEPENDENT_AMBULATORY_CARE_PROVIDER_SITE_OTHER): Payer: Medicare Other | Admitting: Family Medicine

## 2019-04-27 VITALS — BP 160/88 | HR 100 | Temp 98.3°F | Resp 15 | Ht 67.0 in | Wt 190.0 lb

## 2019-04-27 DIAGNOSIS — Z23 Encounter for immunization: Secondary | ICD-10-CM | POA: Diagnosis not present

## 2019-04-27 DIAGNOSIS — F17210 Nicotine dependence, cigarettes, uncomplicated: Secondary | ICD-10-CM | POA: Diagnosis not present

## 2019-04-27 DIAGNOSIS — E559 Vitamin D deficiency, unspecified: Secondary | ICD-10-CM

## 2019-04-27 DIAGNOSIS — F17200 Nicotine dependence, unspecified, uncomplicated: Secondary | ICD-10-CM

## 2019-04-27 DIAGNOSIS — E663 Overweight: Secondary | ICD-10-CM

## 2019-04-27 DIAGNOSIS — R7303 Prediabetes: Secondary | ICD-10-CM

## 2019-04-27 DIAGNOSIS — E785 Hyperlipidemia, unspecified: Secondary | ICD-10-CM | POA: Diagnosis not present

## 2019-04-27 DIAGNOSIS — I10 Essential (primary) hypertension: Secondary | ICD-10-CM | POA: Diagnosis not present

## 2019-04-27 MED ORDER — TRIAMTERENE-HCTZ 37.5-25 MG PO TABS: 1.0000 | ORAL_TABLET | Freq: Every day | ORAL | 3 refills | Status: DC

## 2019-04-27 MED ORDER — BENZONATATE 100 MG PO CAPS: 100.0000 mg | ORAL_CAPSULE | Freq: Two times a day (BID) | ORAL | 0 refills | Status: DC | PRN

## 2019-04-27 NOTE — Patient Instructions (Addendum)
F/U for blood pressure re eval in office with MD in 5 to 7 weeks, call if you need me sooner  Flu vaccine today  Fasting cBC, lipid, cmp and EGFR, TSH and vitamin D tomorrow morning  New medication for blood pressure start tonight is triamterene/ hctz, please STOP HcTZ, continue amlodipine as before  Tessalon perles sent fo cough  Now smoking 3 to 5 cigarettes/ day, continue to work on QUITTING!  Congrats on weight loss with change in food choice, keep it up!

## 2019-04-28 ENCOUNTER — Encounter: Payer: Self-pay | Admitting: Family Medicine

## 2019-04-28 DIAGNOSIS — E559 Vitamin D deficiency, unspecified: Secondary | ICD-10-CM | POA: Diagnosis not present

## 2019-04-28 DIAGNOSIS — I10 Essential (primary) hypertension: Secondary | ICD-10-CM | POA: Diagnosis not present

## 2019-04-28 DIAGNOSIS — E785 Hyperlipidemia, unspecified: Secondary | ICD-10-CM | POA: Diagnosis not present

## 2019-04-28 NOTE — Assessment & Plan Note (Signed)
  Patient re-educated about  the importance of commitment to a  minimum of 150 minutes of exercise per week as able.  The importance of healthy food choices with portion control discussed, as well as eating regularly and within a 12 hour window most days. The need to choose "clean , green" food 50 to 75% of the time is discussed, as well as to make water the primary drink and set a goal of 64 ounces water daily.    Weight /BMI 04/27/2019 02/26/2019 02/20/2019  WEIGHT 190 lb 194 lb 0.6 oz 197 lb 9.6 oz  HEIGHT 5\' 7"  5\' 7"  5\' 7"   BMI 29.76 kg/m2 30.39 kg/m2 30.95 kg/m2

## 2019-04-28 NOTE — Assessment & Plan Note (Signed)
Hyperlipidemia:Low fat diet discussed and encouraged.   Lipid Panel  Lab Results  Component Value Date   CHOL 242 (H) 12/17/2018   HDL 44 12/17/2018   LDLCALC 161 (H) 12/17/2018   TRIG 184 (H) 12/17/2018   CHOLHDL 4.1 04/23/2018   Updated lab needed at/ before next visit.

## 2019-04-28 NOTE — Assessment & Plan Note (Signed)
Asked:confirms currently smokes 3  Cigarettes/ day Assess: Unwilling to quit but cutting back Advise: needs to QUIT to reduce risk of cancer, cardio and cerebrovascular disease Assist: counseled for 5 minutes and literature provided Arrange: follow up in 3 months

## 2019-04-28 NOTE — Assessment & Plan Note (Signed)
Uncontrolled , start maxzide , d/c HCTZ DASH diet and commitment to daily physical activity for a minimum of 30 minutes discussed and encouraged, as a part of hypertension management. The importance of attaining a healthy weight is also discussed.  BP/Weight 04/27/2019 02/26/2019 02/20/2019 12/16/2018 11/28/2018 [id number]  Systolic BP 0000000 0000000 [id number] Q000111Q 0000000  Diastolic BP 88 90 89 90 90 82 94  Wt. (Lbs) 190 194.04 197.6 196 196.4 198 198.2  BMI 29.76 30.39 30.95 30.7 30.76 31.01 31.99      f/u in 5 to 7 weeks

## 2019-04-28 NOTE — Progress Notes (Signed)
[name]     MRN: [id number]      DOB: 1940-09-23   HPI [name] is here for follow up and re-evaluation of chronic medical conditions, medication management and review of any available recent lab and radiology data.  Preventive health is updated, specifically  Cancer screening and Immunization.   Recently treated for sinusitis, this has cleared, still has a nagging cough with scant white sputum, denies fever or chills The PT denies any adverse reactions to current medications since the last visit.  There are no new concerns.   ROS Denies recent fever or chills. Denies sinus pressure, nasal congestion, ear pain or sore throat. Denies chest congestion,  or wheezing. Denies chest pains, palpitations and leg swelling Denies abdominal pain, nausea, vomiting,diarrhea or constipation.   Denies dysuria, frequency, hesitancy or incontinence. Denies uncontrolled  joint pain, swelling and limitation in mobility. Denies headaches, seizures, numbness, or tingling. Denies depression, anxiety or insomnia. Denies skin break down or rash.   PE  BP (!) 160/88   Pulse 100   Temp 98.3 F (36.8 C) (Temporal)   Resp 15   Ht 5\' 7"  (1.702 m)   Wt 190 lb (86.2 kg)   SpO2 96%   BMI 29.76 kg/m   Patient alert and oriented and in no cardiopulmonary distress.  HEENT: No facial asymmetry, EOMI,   oropharynx pink and moist.  Neck supple no JVD, no mass.  Chest: Clear to auscultation bilaterally.  CVS: S1, S2 no murmurs, no S3.Regular rate.  ABD: Soft non tender.   Ext: No edema  MS: decreased  ROM spine, shoulders, hips and knees.  Skin: Intact, no ulcerations or rash noted.  Psych: Good eye contact, normal affect. Memory intact not anxious or depressed appearing.  CNS: CN 2-12 intact, power,  normal throughout.no focal deficits noted.   Assessment & Plan  Essential hypertension Uncontrolled , start maxzide , d/c HCTZ DASH diet and commitment to daily physical activity for a  minimum of 30 minutes discussed and encouraged, as a part of hypertension management. The importance of attaining a healthy weight is also discussed.  BP/Weight 04/27/2019 02/26/2019 02/20/2019 12/16/2018 11/28/2018 [id number] [id number]  Systolic BP 0000000 0000000 [id number] Q000111Q 0000000  Diastolic BP 88 90 89 90 90 82 94  Wt. (Lbs) 190 194.04 197.6 196 196.4 198 198.2  BMI 29.76 30.39 30.95 30.7 30.76 31.01 31.99      f/u in 5 to 7 weeks  NICOTINE ADDICTION Asked:confirms currently smokes 3  Cigarettes/ day Assess: Unwilling to quit but cutting back Advise: needs to QUIT to reduce risk of cancer, cardio and cerebrovascular disease Assist: counseled for 5 minutes and literature provided Arrange: follow up in 3 months   Hyperlipidemia LDL goal <100 Hyperlipidemia:Low fat diet discussed and encouraged.   Lipid Panel  Lab Results  Component Value Date   CHOL 242 (H) 12/17/2018   HDL 44 12/17/2018   LDLCALC 161 (H) 12/17/2018   TRIG 184 (H) 12/17/2018   CHOLHDL 4.1 04/23/2018   Updated lab needed at/ before next visit.     Overweight (BMI 25.0-29.9)  Patient re-educated about  the importance of commitment to a  minimum of 150 minutes of exercise per week as able.  The importance of healthy food choices with portion control discussed, as well as eating regularly and within a 12 hour window most days. The need to choose "clean , green" food 50 to 75% of the time is discussed, as well as to  make water the primary drink and set a goal of 64 ounces water daily.    Weight /BMI 04/27/2019 02/26/2019 02/20/2019  WEIGHT 190 lb 194 lb 0.6 oz 197 lb 9.6 oz  HEIGHT 5\' 7"  5\' 7"  5\' 7"   BMI 29.76 kg/m2 30.39 kg/m2 30.95 kg/m2

## 2019-04-29 LAB — CMP14+EGFR
ALT: 6 IU/L (ref 0–32)
AST: 11 IU/L (ref 0–40)
Albumin/Globulin Ratio: 1.4 (ref 1.2–2.2)
Albumin: 4.2 g/dL (ref 3.7–4.7)
Alkaline Phosphatase: 143 IU/L — ABNORMAL HIGH (ref 39–117)
BUN/Creatinine Ratio: 9 — ABNORMAL LOW (ref 12–28)
BUN: 12 mg/dL (ref 8–27)
Bilirubin Total: 0.6 mg/dL (ref 0.0–1.2)
CO2: 28 mmol/L (ref 20–29)
Calcium: 9.7 mg/dL (ref 8.7–10.3)
Chloride: 97 mmol/L (ref 96–106)
Creatinine, Ser: 1.27 mg/dL — ABNORMAL HIGH (ref 0.57–1.00)
GFR calc Af Amer: 47 mL/min/{1.73_m2} — ABNORMAL LOW (ref >59)
GFR calc non Af Amer: 41 mL/min/{1.73_m2} — ABNORMAL LOW (ref >59)
Globulin, Total: 2.9 g/dL (ref 1.5–4.5)
Glucose: 106 mg/dL — ABNORMAL HIGH (ref 65–99)
Potassium: 3.8 mmol/L (ref 3.5–5.2)
Sodium: 141 mmol/L (ref 134–144)
Total Protein: 7.1 g/dL (ref 6.0–8.5)

## 2019-04-29 LAB — CBC
Hematocrit: 39.7 % (ref 34.0–46.6)
Hemoglobin: 13.1 g/dL (ref 11.1–15.9)
MCH: 28.1 pg (ref 26.6–33.0)
MCHC: 33 g/dL (ref 31.5–35.7)
MCV: 85 fL (ref 79–97)
Platelets: 330 10*3/uL (ref 150–450)
RBC: 4.67 x10E6/uL (ref 3.77–5.28)
RDW: 14.4 % (ref 11.7–15.4)
WBC: 8.4 10*3/uL (ref 3.4–10.8)

## 2019-04-29 LAB — LIPID PANEL WITH LDL/HDL RATIO
Cholesterol, Total: 240 mg/dL — ABNORMAL HIGH (ref 100–199)
HDL: 50 mg/dL (ref >39)
LDL Chol Calc (NIH): 161 mg/dL — ABNORMAL HIGH (ref 0–99)
LDL/HDL Ratio: 3.2 ratio (ref 0.0–3.2)
Triglycerides: 158 mg/dL — ABNORMAL HIGH (ref 0–149)
VLDL Cholesterol Cal: 29 mg/dL (ref 5–40)

## 2019-04-29 LAB — VITAMIN D 25 HYDROXY (VIT D DEFICIENCY, FRACTURES): Vit D, 25-Hydroxy: 28.3 ng/mL — ABNORMAL LOW (ref 30.0–100.0)

## 2019-04-29 LAB — TSH: TSH: 0.714 u[IU]/mL (ref 0.450–4.500)

## 2019-05-06 ENCOUNTER — Other Ambulatory Visit: Payer: Self-pay | Admitting: Family Medicine

## 2019-05-06 MED ORDER — ATORVASTATIN CALCIUM 20 MG PO TABS: 20.0000 mg | ORAL_TABLET | Freq: Every day | ORAL | 1 refills | Status: DC

## 2019-05-20 ENCOUNTER — Other Ambulatory Visit: Payer: Self-pay

## 2019-05-20 ENCOUNTER — Encounter: Payer: Self-pay | Admitting: Family Medicine

## 2019-05-20 ENCOUNTER — Ambulatory Visit (INDEPENDENT_AMBULATORY_CARE_PROVIDER_SITE_OTHER): Payer: Medicare Other | Admitting: Family Medicine

## 2019-05-20 VITALS — BP 160/88 | Ht 67.0 in | Wt 190.0 lb

## 2019-05-20 DIAGNOSIS — E785 Hyperlipidemia, unspecified: Secondary | ICD-10-CM | POA: Diagnosis not present

## 2019-05-20 DIAGNOSIS — J0121 Acute recurrent ethmoidal sinusitis: Secondary | ICD-10-CM | POA: Diagnosis not present

## 2019-05-20 DIAGNOSIS — F17200 Nicotine dependence, unspecified, uncomplicated: Secondary | ICD-10-CM | POA: Diagnosis not present

## 2019-05-20 DIAGNOSIS — I10 Essential (primary) hypertension: Secondary | ICD-10-CM

## 2019-05-20 DIAGNOSIS — J440 Chronic obstructive pulmonary disease with acute lower respiratory infection: Secondary | ICD-10-CM | POA: Diagnosis not present

## 2019-05-20 DIAGNOSIS — J209 Acute bronchitis, unspecified: Secondary | ICD-10-CM

## 2019-05-20 MED ORDER — PENICILLIN V POTASSIUM 500 MG PO TABS: 500.0000 mg | ORAL_TABLET | Freq: Three times a day (TID) | ORAL | 0 refills | Status: DC

## 2019-05-20 MED ORDER — BENZONATATE 100 MG PO CAPS: 100.0000 mg | ORAL_CAPSULE | Freq: Two times a day (BID) | ORAL | 1 refills | Status: DC | PRN

## 2019-05-20 MED ORDER — PROMETHAZINE-DM 6.25-15 MG/5ML PO SYRP: ORAL_SOLUTION | ORAL | 0 refills | Status: DC

## 2019-05-20 NOTE — Progress Notes (Signed)
Virtual Visit via Telephone Note  I connected with [name] on 05/20/19 at  2:00 PM EDT by telephone and verified that I am speaking with the correct person using two identifiers.  Location: Patient: home Provider: Provider   I discussed the limitations, risks, security and privacy concerns of performing an evaluation and management service by telephone and the availability of in person appointments. I also discussed with the patient that there may be a patient responsible charge related to this service. The patient expressed understanding and agreed to proceed.   History of Present Illness:   1 week h/o cough productive of yellow sputum and yellow nasal drainage no fever , chills or body aches, no known  covid 19 exposure / contact Denies recent fever or chills.  Denies abdominal pain, nausea, vomiting,diarrhea or constipation.   Denies dysuria, frequency, hesitancy or incontinence. Denies uncontrolled  joint pain, swelling and limitation in mobility. Denies headaches, seizures, numbness, or tingling. Denies depression, anxiety or insomnia. Denies skin break down or rash.     Observations/Objective: BP (!) 160/88   Ht 5\' 7"  (1.702 m)   Wt 190 lb (86.2 kg)   BMI 29.76 kg/m  Good communication with no confusion and intact memory. Alert and oriented x 3 No signs of respiratory distress during speech, sound coongested in sinuses with occasional, intermittent cough    Assessment and Plan: Acute bronchitis with COPD ([place]) Antibiotic,and cough suppressant prescribed, pt already has tessalon [erles amd these are reffilld  Acute ethmoidal sinusitis Penicilli x days , and saline nasal flushes recommended  Essential hypertension Needs in office re evaluation as uncontrolled  DASH diet and commitment to daily physical activity for a minimum of 30 minutes discussed and encouraged, as a part of hypertension management. The importance of attaining a healthy weight is also  discussed.  BP/Weight 05/20/2019 04/27/2019 02/26/2019 02/20/2019 12/16/2018 11/28/2018 [id number]  Systolic BP 0000000 0000000 0000000 [id number] Q000111Q  Diastolic BP 88 88 90 89 90 90 82  Wt. (Lbs) 190 190 194.04 197.6 196 196.4 198  BMI 29.76 29.76 30.39 30.95 30.7 30.76 31.01       NICOTINE ADDICTION Asked:confirms currently smokes cigarettes, 3 to 5 /day Assess: Unwilling to quit but cutting back Advise: needs to QUIT to reduce risk of cancer, cardio and cerebrovascular disease Assist: counseled for 5 minutes and literature provided Arrange: follow up in 3 months   Hyperlipidemia LDL goal <100 Hyperlipidemia:Low fat diet discussed and encouraged.   Lipid Panel  Lab Results  Component Value Date   CHOL 240 (H) 04/28/2019   HDL 50 04/28/2019   LDLCALC 161 (H) 04/28/2019   TRIG 158 (H) 04/28/2019   CHOLHDL 4.1 04/23/2018  uncontrolled ,needs to reduce fat intake       Follow Up Instructions:    I discussed the assessment and treatment plan with the patient. The patient was provided an opportunity to ask questions and all were answered. The patient agreed with the plan and demonstrated an understanding of the instructions.   The patient was advised to call back or seek an in-person evaluation if the symptoms worsen or if the condition fails to improve as anticipated.  I provided 22 minutes of non-face-to-face time during this encounter.   [name], MD

## 2019-05-20 NOTE — Patient Instructions (Signed)
Please keep November f/u already scheduled , call if you need me sooner  You are treated for acute sinusitis and bronchitis, 3 medications are at your pharmacy as we discussed  If you worsen as far as fever, shortness of breath or any new concerning symptoms please  let me know  Thanks for choosing [facility], we consider it a privelige to serve you.'

## 2019-05-24 ENCOUNTER — Encounter: Payer: Self-pay | Admitting: Family Medicine

## 2019-05-24 NOTE — Assessment & Plan Note (Signed)
Asked:confirms currently smokes cigarettes, 3 to 5 /day Assess: Unwilling to quit but cutting back Advise: needs to QUIT to reduce risk of cancer, cardio and cerebrovascular disease Assist: counseled for 5 minutes and literature provided Arrange: follow up in 3 months

## 2019-05-24 NOTE — Assessment & Plan Note (Signed)
Needs in office re evaluation as uncontrolled  DASH diet and commitment to daily physical activity for a minimum of 30 minutes discussed and encouraged, as a part of hypertension management. The importance of attaining a healthy weight is also discussed.  BP/Weight 05/20/2019 04/27/2019 02/26/2019 02/20/2019 12/16/2018 11/28/2018 [id number]  Systolic BP 0000000 0000000 0000000 [id number] [id number]  Diastolic BP 88 88 90 89 90 90 82  Wt. (Lbs) 190 190 194.04 197.6 196 196.4 198  BMI 29.76 29.76 30.39 30.95 30.7 30.76 31.01

## 2019-05-24 NOTE — Assessment & Plan Note (Signed)
Hyperlipidemia:Low fat diet discussed and encouraged.   Lipid Panel  Lab Results  Component Value Date   CHOL 240 (H) 04/28/2019   HDL 50 04/28/2019   LDLCALC 161 (H) 04/28/2019   TRIG 158 (H) 04/28/2019   CHOLHDL 4.1 04/23/2018  uncontrolled ,needs to reduce fat intake

## 2019-05-24 NOTE — Assessment & Plan Note (Signed)
Antibiotic,and cough suppressant prescribed, pt already has tessalon [erles amd these are reffilld

## 2019-05-24 NOTE — Assessment & Plan Note (Signed)
Penicilli x days , and saline nasal flushes recommended

## 2019-05-26 ENCOUNTER — Other Ambulatory Visit: Payer: Self-pay

## 2019-05-26 ENCOUNTER — Encounter: Payer: Self-pay | Admitting: Gastroenterology

## 2019-05-26 ENCOUNTER — Ambulatory Visit: Payer: Medicare Other | Admitting: Gastroenterology

## 2019-05-26 VITALS — BP 197/99 | HR 98 | Temp 98.2°F | Ht 67.0 in | Wt 186.6 lb

## 2019-05-26 DIAGNOSIS — K219 Gastro-esophageal reflux disease without esophagitis: Secondary | ICD-10-CM | POA: Diagnosis not present

## 2019-05-26 DIAGNOSIS — K5900 Constipation, unspecified: Secondary | ICD-10-CM

## 2019-05-26 NOTE — Progress Notes (Signed)
Referring Provider: Fayrene Helper, MD Primary Care Physician:  Fayrene Helper, MD Primary GI: Dr. Oneida Alar    Chief Complaint  Patient presents with  . Gastroesophageal Reflux    HPI:   [name] is a 78 y.o. female presenting today with a history of PUD in remote past, GERD, constipation, with next colonoscopy in 2024 if benefits outweigh the risks. At last visit, dealing with uncontrolled GERD. Increased PPI to BID.   Good appetite. No abdominal pain. Dealing with a cold right now. No dysphagia. No melena. No hematochezia. PPI BID currently, which has helped her symptoms.   Constipation: used to be taking Linzess. Too expensive with insurance. Has a savings card but not sure which one it is. Using MOM, which helps.     Past Medical History:  Diagnosis Date  . COPD (chronic obstructive pulmonary disease) ([place])   . Hiatal hernia without gangrene and obstruction 03/23/2015  . Hypertension 2004  . Lung nodule seen on imaging study   . Nicotine addiction   . OA (osteoarthritis) of knee   . Obesity   . Sinusitis     Past Surgical History:  Procedure Laterality Date  . bilateral cataract surgery  7/27 & 03/01/09   Dr. Gershon Crane    . cholecystectomy    . COLONOSCOPY  04/2004   Dr. Leane Para Smith-->melanosis coli  . COLONOSCOPY WITH ESOPHAGOGASTRODUODENOSCOPY (EGD) N/A 02/25/2013   Dr. Fields:Normal mucosa in the terminal ileum/Severe melanosis throughout the entire examined colon/ONE COLON POLYP REMOVED (tubular adenoma)/Mild diverticulosis  in the sigmoid colon/EGD:The mucosa of the esophagus appeared normal/Non-erosive gastritis, empiric dilation with Savary dilator  . ESOPHAGOGASTRODUODENOSCOPY  11/07/2010   Jenkins:hiatal hernia/no evidence of Barrett esophagitis.  The Z-line was noted to be at 39 cm from the teeth. CLO test negative  . ESOPHAGOGASTRODUODENOSCOPY N/A 09/11/2013   Dr.Fields-  incomplete esophageal web in mid-esophagus, medium sized hialtal hernia,  PUD. bx=focal erosion with inflammation and fibrosis  . ESOPHAGOGASTRODUODENOSCOPY N/A 07/06/2014   healed ulcers, persistent erosions on aspirin, small hiatal hernia  . knee arthroscopy right    . LEFT HEART CATHETERIZATION WITH CORONARY ANGIOGRAM N/A 04/08/2014   Procedure: LEFT HEART CATHETERIZATION WITH CORONARY ANGIOGRAM;  Surgeon: Sinclair Grooms, MD;  Location: [facility] CATH LAB;  Service: Cardiovascular;  Laterality: N/A;  . MALONEY DILATION N/A 02/25/2013   Procedure: Venia Minks DILATION;  Surgeon: Danie Binder, MD;  Location: [facility] ENDO SUITE;  Service: Endoscopy;  Laterality: N/A;  . PARTIAL HYSTERECTOMY    . SAVORY DILATION N/A 02/25/2013   Procedure: SAVORY DILATION;  Surgeon: Danie Binder, MD;  Location: [facility] ENDO SUITE;  Service: Endoscopy;  Laterality: N/A;  . total knee arthroplasty right  05/29/2005   Dr. Aline Brochure     Current Outpatient Medications  Medication Sig Dispense Refill  . albuterol (PROVENTIL HFA;VENTOLIN HFA) 108 (90 Base) MCG/ACT inhaler Inhale 2 puffs into the lungs every 6 (six) hours as needed for wheezing or shortness of breath. 1 Inhaler 1  . ALOE VERA PO Take 500 mg by mouth as needed.     Marland Kitchen amLODipine (NORVASC) 10 MG tablet Take 1 tablet (10 mg total) by mouth daily. 30 tablet 3  . aspirin EC 81 MG tablet Take 81 mg by mouth daily.    Marland Kitchen atorvastatin (LIPITOR) 20 MG tablet Take 1 tablet (20 mg total) by mouth daily. 90 tablet 1  . benzonatate (TESSALON) 100 MG capsule Take 1 capsule (100 mg total) by mouth 2 (two)  times daily as needed for cough. 20 capsule 1  . Calcium Carbonate-Vitamin D (CALCIUM + D PO) Take 1 tablet by mouth daily. 600 plus D chew    . Calcium Carbonate-Vitamin D (CALTRATE 600+D PO) Take 1 each by mouth daily.    . fluticasone (FLONASE) 50 MCG/ACT nasal spray Place 2 sprays into both nostrils daily as needed for allergies or rhinitis. 16 g 2  . montelukast (SINGULAIR) 10 MG tablet Take 1 tablet (10 mg total) by mouth at bedtime. 30 tablet 3  .  pantoprazole (PROTONIX) 40 MG tablet Take 1 tablet (40 mg total) by mouth 2 (two) times daily before a meal. 60 tablet 3  . penicillin v potassium (VEETID) 500 MG tablet Take 1 tablet (500 mg total) by mouth 3 (three) times daily. 30 tablet 0  . promethazine-dextromethorphan (PROMETHAZINE-DM) 6.25-15 MG/5ML syrup Take one teaspoon at bedtime as needed for excess cough 180 mL 0  . triamterene-hydrochlorothiazide (MAXZIDE-25) 37.5-25 MG tablet Take 1 tablet by mouth daily. 30 tablet 3   No current facility-administered medications for this visit.     Allergies as of 05/26/2019  . (No Known Allergies)    Family History  Problem Relation Age of Onset  . Diabetes Mother   . Hypertension Mother   . Coronary artery disease Mother   . Pneumonia Father   . Hypertension Sister   . Hypertension Sister   . Hypertension Brother        Psychologist, forensic  . Diabetes Brother   . Hypertension Brother   . Colon cancer Neg Hx   . Liver disease Neg Hx     Social History   Socioeconomic History  . Marital status: Widowed    Spouse name: Not on file  . Number of children: 4  . Years of education: Not on file  . Highest education level: Not on file  Occupational History  . Occupation: employed     Fish farm manager: RETIRED  Social Needs  . Financial resource strain: Not very hard  . Food insecurity    Worry: Sometimes true    Inability: Sometimes true  . Transportation needs    Medical: No    Non-medical: No  Tobacco Use  . Smoking status: Current Some Day Smoker    Packs/day: 0.10    Years: 10.00    Pack years: 1.00    Types: Cigarettes    Start date: 07/23/1962  . Smokeless tobacco: Never Used  . Tobacco comment: 3 per day   Substance and Sexual Activity  . Alcohol use: No    Alcohol/week: 0.0 standard drinks  . Drug use: No  . Sexual activity: Never  Lifestyle  . Physical activity    Days per week: 0 days    Minutes per session: 0 min  . Stress: Only a little  Relationships  . Social  connections    Talks on phone: More than three times a week    Gets together: Twice a week    Attends religious service: More than 4 times per year    Active member of club or organization: Not on file    Attends meetings of clubs or organizations: More than 4 times per year    Relationship status: Widowed  Other Topics Concern  . Not on file  Social History Narrative  . Not on file    Review of Systems: Gen: Denies fever, chills, anorexia. Denies fatigue, weakness, weight loss.  CV: Denies chest pain, palpitations, syncope, peripheral edema, and claudication.  Resp: Denies dyspnea at rest, cough, wheezing, coughing up blood, and pleurisy. GI: see HPI Derm: Denies rash, itching, dry skin Psych: Denies depression, anxiety, memory loss, confusion. No homicidal or suicidal ideation.  Heme: Denies bruising, bleeding, and enlarged lymph nodes.  Physical Exam: BP (!) 197/99   Pulse 98   Temp 98.2 F (36.8 C) (Temporal)   Ht 5\' 7"  (1.702 m)   Wt 186 lb 9.6 oz (84.6 kg)   BMI 29.23 kg/m  General:   Alert and oriented. No distress noted. Pleasant and cooperative.  Head:  Normocephalic and atraumatic. Abdomen:  +BS, soft, non-tender and non-distended. No rebound or guarding. No HSM or masses noted. Msk:  Symmetrical without gross deformities. Normal posture. Extremities:  Without edema. Neurologic:  Alert and  oriented x4 Psych:  Alert and cooperative. Normal mood and affect.  ASSESSMENT: [name] is a 78 y.o. female presenting today with history of GERD and constipation, doing well overall. Previously had reflux exacerbation that responded to BID PPI dosing and was without alarm signs/symptoms. We discussed continuing dietary/behavior modification and decreasing to once daily. Linzess 290 mcg has worked best in the past for constipation, but she needs to find her savings card to continue this. Provided samples in interim.    PLAN:  1. Protonix daily. If needed, BID, but use  lowest dosing possible.  2. Linzess 290 mcg samples provided.  3. Return in 6 months  Annitta Needs, PhD, [facility]

## 2019-05-26 NOTE — Patient Instructions (Signed)
Let's try to decrease Protonix to once a day, 30 minutes before breakfast. If needed you can take a twice a day, but let's try at the lowest dose we can.  I have provided samples of Linzess to have on hand. Let us know if you need a prescription.  We will see you in 6 months!  I enjoyed seeing you again today! As you know, I value our relationship and want to provide genuine, compassionate, and quality care. I welcome your feedback. If you receive a survey regarding your visit,  I greatly appreciate you taking time to fill this out. See you next time!  Annitta Needs, PhD, ANP-BC [facility]

## 2019-05-26 NOTE — Progress Notes (Signed)
CC'ED TO PCP 

## 2019-06-02 ENCOUNTER — Ambulatory Visit (INDEPENDENT_AMBULATORY_CARE_PROVIDER_SITE_OTHER): Payer: Medicare Other | Admitting: Family Medicine

## 2019-06-02 ENCOUNTER — Encounter: Payer: Self-pay | Admitting: Family Medicine

## 2019-06-02 ENCOUNTER — Other Ambulatory Visit: Payer: Self-pay

## 2019-06-02 VITALS — BP 174/88 | Ht 67.0 in | Wt 186.0 lb

## 2019-06-02 DIAGNOSIS — F17200 Nicotine dependence, unspecified, uncomplicated: Secondary | ICD-10-CM

## 2019-06-02 DIAGNOSIS — K219 Gastro-esophageal reflux disease without esophagitis: Secondary | ICD-10-CM

## 2019-06-02 DIAGNOSIS — E785 Hyperlipidemia, unspecified: Secondary | ICD-10-CM

## 2019-06-02 DIAGNOSIS — F17210 Nicotine dependence, cigarettes, uncomplicated: Secondary | ICD-10-CM

## 2019-06-02 DIAGNOSIS — I10 Essential (primary) hypertension: Secondary | ICD-10-CM | POA: Diagnosis not present

## 2019-06-02 NOTE — Assessment & Plan Note (Signed)
Improved, recently had dose reduction in PPI by GI

## 2019-06-02 NOTE — Patient Instructions (Signed)
F/U on 06/15/2019 in office ,with MD,  with medication and pill box, for re evaluation of blood pressure  Please take all medications every morning  Fill pill box every week, two blood pressure pills, amlodipine and triamterene One cholesterol pill, Atorvastatin 20 mg  One stomach pill pantoprazole  Sorry about your stress, please work on stopping smoking cigarettes, that will help your blood pressure and protect your heart  Thanks for choosing [facility], we consider it a privelige to serve you.

## 2019-06-02 NOTE — Assessment & Plan Note (Signed)
Asked:confirms currently smokes cigarettes Assess: Unwilling to quit but cutting back Advise: needs to QUIT to reduce risk of cancer, cardio and cerebrovascular disease Assist: counseled for 5 minutes and literature provided Arrange: follow up in 3 months  

## 2019-06-02 NOTE — Progress Notes (Signed)
  Virtual Visit via Telephone Note  I connected with [name] on 06/02/19 at 10:00 AM EST by telephone and verified that I am speaking with the correct person using two identifiers.  Location: Patient: home Provider: office   I discussed the limitations, risks, security and privacy concerns of performing an evaluation and management service by telephone and the availability of in person appointments. I also discussed with the patient that there may be a patient responsible charge related to this service. The patient expressed understanding and agreed to proceed.   History of Present Illness: F/U chronic problems, hypertension and hyperlipidemia F/U bronchitis and sinusitis Denies current  fever or chills. Denies sinus pressure, nasal congestion, ear pain or sore throat. Denies chest congestion, productive cough or wheezing. Denies chest pains, palpitations and leg swelling Denies abdominal pain, nausea, vomiting,diarrhea or constipation.   Denies dysuria, frequency, hesitancy or incontinence. C/o chronic  joint pain, swelling and limitation in mobility. Denies headaches, seizures, numbness, or tingling. Denies depression, anxiety or insomnia. Denies skin break down or rash.       Observations/Objective: BP (!) 174/88   Ht 5\' 7"  (1.702 m)   Wt 186 lb (84.4 kg)   BMI 29.13 kg/m  ( pt reported) Good communication with no confusion and intact memory. Alert and oriented x 3 No signs of respiratory distress during speech    Assessment and Plan:  Essential hypertension Uncontrolled, medication review leads  Me to suspect that pt is taking her medications inconsistently. Spent 8 minutes reviewing each medication and indication Advised packing pill box weekly and sh is to bring her box and medication bottles to in house visit DASH diet and commitment to daily physical activity for a minimum of 30 minutes discussed and encouraged, as a part of hypertension management. The  importance of attaining a healthy weight is also discussed.  BP/Weight 06/02/2019 05/26/2019 05/20/2019 04/27/2019 02/26/2019 02/20/2019 [id number]  Systolic BP [id number] 0000000 0000000 0000000 [id number]  Diastolic BP 88 99 88 88 90 89 90  Wt. (Lbs) 186 186.6 190 190 194.04 197.6 196  BMI 29.13 29.23 29.76 29.76 30.39 30.95 30.7       NICOTINE ADDICTION Asked:confirms currently smokes cigarettes Assess: Unwilling to quit but cutting back Advise: needs to QUIT to reduce risk of cancer, cardio and cerebrovascular disease Assist: counseled for 5 minutes and literature provided Arrange: follow up in 3 months   GERD (gastroesophageal reflux disease) Improved, recently had dose reduction in PPI by GI  Hyperlipidemia LDL goal <100 Unchanged and uncontrolled despite stating that she is taking statin prescribed. Needs to lower fat intake and to pack pill box and take medication consistently   ' Follow Up Instructions:    I discussed the assessment and treatment plan with the patient. The patient was provided an opportunity to ask questions and all were answered. The patient agreed with the plan and demonstrated an understanding of the instructions.   The patient was advised to call back or seek an in-person evaluation if the symptoms worsen or if the condition fails to improve as anticipated.  I provided 21 minutes of non-face-to-face time during this encounter.   [name], MD

## 2019-06-02 NOTE — Assessment & Plan Note (Signed)
Uncontrolled, medication review leads  Me to suspect that pt is taking her medications inconsistently. Spent 8 minutes reviewing each medication and indication Advised packing pill box weekly and sh is to bring her box and medication bottles to in house visit DASH diet and commitment to daily physical activity for a minimum of 30 minutes discussed and encouraged, as a part of hypertension management. The importance of attaining a healthy weight is also discussed.  BP/Weight 06/02/2019 05/26/2019 05/20/2019 04/27/2019 02/26/2019 02/20/2019 [id number]  Systolic BP [id number] 0000000 0000000 0000000 [id number]  Diastolic BP 88 99 88 88 90 89 90  Wt. (Lbs) 186 186.6 190 190 194.04 197.6 196  BMI 29.13 29.23 29.76 29.76 30.39 30.95 30.7

## 2019-06-02 NOTE — Assessment & Plan Note (Signed)
Unchanged and uncontrolled despite stating that she is taking statin prescribed. Needs to lower fat intake and to pack pill box and take medication consistently

## 2019-06-15 ENCOUNTER — Encounter: Payer: Self-pay | Admitting: Family Medicine

## 2019-06-15 ENCOUNTER — Ambulatory Visit (INDEPENDENT_AMBULATORY_CARE_PROVIDER_SITE_OTHER): Payer: Medicare Other | Admitting: Family Medicine

## 2019-06-15 ENCOUNTER — Ambulatory Visit: Payer: Medicare Other | Admitting: Family Medicine

## 2019-06-15 ENCOUNTER — Other Ambulatory Visit: Payer: Self-pay

## 2019-06-15 VITALS — BP 132/80 | HR 97 | Temp 97.8°F | Ht 67.0 in | Wt 185.0 lb

## 2019-06-15 DIAGNOSIS — E663 Overweight: Secondary | ICD-10-CM

## 2019-06-15 DIAGNOSIS — F17200 Nicotine dependence, unspecified, uncomplicated: Secondary | ICD-10-CM | POA: Diagnosis not present

## 2019-06-15 DIAGNOSIS — I10 Essential (primary) hypertension: Secondary | ICD-10-CM

## 2019-06-15 DIAGNOSIS — K219 Gastro-esophageal reflux disease without esophagitis: Secondary | ICD-10-CM | POA: Diagnosis not present

## 2019-06-15 DIAGNOSIS — E785 Hyperlipidemia, unspecified: Secondary | ICD-10-CM

## 2019-06-15 MED ORDER — MONTELUKAST SODIUM 10 MG PO TABS: 10.0000 mg | ORAL_TABLET | Freq: Every day | ORAL | 3 refills | Status: DC

## 2019-06-15 NOTE — Progress Notes (Signed)
[name]     MRN: [id number]      DOB: 04-Mar-1941   HPI [name] is here for follow up and re-evaluation of chronic medical conditions, medication management and review of any available recent lab and radiology data.  Preventive health is updated, specifically  Cancer screening and Immunization.   Questions or concerns regarding consultations or procedures which the PT has had in the interim are  addressed. The PT denies any adverse reactions to current medications since the last visit.  C/o irregular blood pressures , 164/86 right and 130/79  ROS Denies recent fever or chills. Denies sinus pressure, nasal congestion, ear pain or sore throat. Denies chest congestion, productive cough or wheezing. Denies chest pains, palpitations and leg swelling Denies abdominal pain, nausea, vomiting,diarrhea or constipation.   Denies dysuria, frequency, hesitancy or incontinence. Denies uncontrolled joint pain, swelling and limitation in mobility. Denies headaches, seizures, numbness, or tingling. Denies depression, anxiety or insomnia. Denies skin break down or rash.   PE  BP 132/80   Pulse 97   Temp 97.8 F (36.6 C) (Temporal)   Ht 5\' 7"  (1.702 m)   Wt 185 lb (83.9 kg)   SpO2 93%   BMI 28.98 kg/m   Patient alert and oriented and in no cardiopulmonary distress.  HEENT: No facial asymmetry, EOMI,     Neck supple .  Chest: Clear to auscultation bilaterally.  CVS: S1, S2 no murmurs, no S3.Regular rate.  ABD: Soft non tender.   Ext: No edema  [id number] though adequate ROM spine, shoulders, hips and knees.  Skin: Intact, no ulcerations or rash noted.  Psych: Good eye contact, normal affect. Memory intact not anxious or depressed appearing.  CNS: CN 2-12 intact, power,  normal throughout.no focal deficits noted.   Assessment & Plan  Essential hypertension Controlled, no change in medication DASH diet and commitment to daily physical activity for a minimum of 30  minutes discussed and encouraged, as a part of hypertension management. The importance of attaining a healthy weight is also discussed.  BP/Weight 06/15/2019 06/02/2019 05/26/2019 05/20/2019 04/27/2019 02/26/2019 [id number]  Systolic BP [id number] 0000000 0000000 0000000 [id number]  Diastolic BP 80 88 99 88 88 90 89  Wt. (Lbs) 185 186 186.6 190 190 194.04 197.6  BMI 28.98 29.13 29.23 29.76 29.76 30.39 30.95       Hyperlipidemia LDL goal <100  Not at goal, nees to lower fat intake Hyperlipidemia:Low fat diet discussed and encouraged.   Lipid Panel  Lab Results  Component Value Date   CHOL 240 (H) 04/28/2019   HDL 50 04/28/2019   LDLCALC 161 (H) 04/28/2019   TRIG 158 (H) 04/28/2019   CHOLHDL 4.1 04/23/2018     Updated lab needed at/ before next visit.   Overweight (BMI 25.0-29.9)  Patient re-educated about  the importance of commitment to a  minimum of 150 minutes of exercise per week as able.  The importance of healthy food choices with portion control discussed, as well as eating regularly and within a 12 hour window most days. The need to choose "clean , green" food 50 to 75% of the time is discussed, as well as to make water the primary drink and set a goal of 64 ounces water daily.    Weight /BMI 06/15/2019 06/02/2019 05/26/2019  WEIGHT 185 lb 186 lb 186 lb 9.6 oz  HEIGHT 5\' 7"  5\' 7"  5\' 7"   BMI 28.98 kg/m2 29.13 kg/m2 29.23 kg/m2      NICOTINE  ADDICTION Asked:confirms currently smokes cigarettes Assess: Unwilling to quit but cutting back Advise: needs to QUIT to reduce risk of cancer, cardio and cerebrovascular disease Assist: counseled for 5 minutes and literature provided Arrange: follow up in 3 months   GERD (gastroesophageal reflux disease) Controlled, no change in medication Followed by GI

## 2019-06-15 NOTE — Assessment & Plan Note (Signed)
Controlled, no change in medication DASH diet and commitment to daily physical activity for a minimum of 30 minutes discussed and encouraged, as a part of hypertension management. The importance of attaining a healthy weight is also discussed.  BP/Weight 06/15/2019 06/02/2019 05/26/2019 05/20/2019 04/27/2019 02/26/2019 [id number]  Systolic BP [id number] 0000000 0000000 0000000 [id number]  Diastolic BP 80 88 99 88 88 90 89  Wt. (Lbs) 185 186 186.6 190 190 194.04 197.6  BMI 28.98 29.13 29.23 29.76 29.76 30.39 30.95

## 2019-06-15 NOTE — Patient Instructions (Addendum)
Anjual physical exam with MD in office Feb 15 or after, call if you need me sooner  Blood pressure is good, continue current medication  Please fill montelukast 10 mg one daily for cough and allergies, also use flonase daily for  Allergies  Thanks for choosing [facility], we consider it a privelige to serve you.   Ensure you drink 64 ounces water daily, no sodas, also reduce canned foods  Increase vegetable, fruit , beans, chicken and fish an [place] and lean pork Fasting lipid, cmp and EGFR 1 week before follow up  Thanks for choosing [facility], we consider it a privelige to serve you.

## 2019-06-27 ENCOUNTER — Encounter: Payer: Self-pay | Admitting: Family Medicine

## 2019-06-27 NOTE — Assessment & Plan Note (Signed)
  Patient re-educated about  the importance of commitment to a  minimum of 150 minutes of exercise per week as able.  The importance of healthy food choices with portion control discussed, as well as eating regularly and within a 12 hour window most days. The need to choose "clean , green" food 50 to 75% of the time is discussed, as well as to make water the primary drink and set a goal of 64 ounces water daily.    Weight /BMI 06/15/2019 06/02/2019 05/26/2019  WEIGHT 185 lb 186 lb 186 lb 9.6 oz  HEIGHT 5\' 7"  5\' 7"  5\' 7"   BMI 28.98 kg/m2 29.13 kg/m2 29.23 kg/m2

## 2019-06-27 NOTE — Assessment & Plan Note (Signed)
Asked:confirms currently smokes cigarettes Assess: Unwilling to quit but cutting back Advise: needs to QUIT to reduce risk of cancer, cardio and cerebrovascular disease Assist: counseled for 5 minutes and literature provided Arrange: follow up in 3 months  

## 2019-06-27 NOTE — Assessment & Plan Note (Signed)
Not at goal, nees to lower fat intake Hyperlipidemia:Low fat diet discussed and encouraged.   Lipid Panel  Lab Results  Component Value Date   CHOL 240 (H) 04/28/2019   HDL 50 04/28/2019   LDLCALC 161 (H) 04/28/2019   TRIG 158 (H) 04/28/2019   CHOLHDL 4.1 04/23/2018     Updated lab needed at/ before next visit.

## 2019-06-27 NOTE — Assessment & Plan Note (Signed)
Controlled, no change in medication Followed by GI 

## 2019-07-21 ENCOUNTER — Telehealth: Payer: Self-pay

## 2019-07-21 ENCOUNTER — Ambulatory Visit
Admission: EM | Admit: 2019-07-21 | Discharge: 2019-07-21 | Disposition: A | Discharge status: Home / Self Care | Payer: Medicare Other | Attending: Emergency Medicine | Admitting: Emergency Medicine

## 2019-07-21 DIAGNOSIS — Z20828 Contact with and (suspected) exposure to other viral communicable diseases: Secondary | ICD-10-CM | POA: Diagnosis not present

## 2019-07-21 DIAGNOSIS — Z20822 Contact with and (suspected) exposure to covid-19: Secondary | ICD-10-CM

## 2019-07-21 DIAGNOSIS — J3489 Other specified disorders of nose and nasal sinuses: Secondary | ICD-10-CM

## 2019-07-21 MED ORDER — FLUCONAZOLE 200 MG PO TABS: 200.0000 mg | ORAL_TABLET | Freq: Every day | ORAL | 0 refills | Status: AC

## 2019-07-21 NOTE — ED Triage Notes (Signed)
Pt presents with sinus symptoms for about a week

## 2019-07-21 NOTE — Telephone Encounter (Signed)
Pls call , if still needs to be seen,mwork in this week as phone visit if possible please

## 2019-07-21 NOTE — ED Provider Notes (Signed)
[facility]    CSN: [id number] Arrival date & time: 07/21/19  1341      History   Chief Complaint No chief complaint on file.   HPI [name] is a 78 y.o. female.   [name] 78 years old female presented to the urgent care with a complaint of sinus pressure and sinus pain for the past 4 to 5 days..  Denies sick exposure to COVID, flu or strep.  Denies recent travel.  Denies aggravating or alleviating symptoms.  Denies previous COVID infection.   Denies fever, chills, fatigue, nasal congestion, rhinorrhea, sore throat, cough, SOB, wheezing, chest pain, nausea, vomiting, changes in bowel or bladder habits.  Patient also wants a refill of Diflucan for yeast infection.  The history is provided by the patient. No language interpreter was used.    Past Medical History:  Diagnosis Date  . COPD (chronic obstructive pulmonary disease) ([place])   . Hiatal hernia without gangrene and obstruction 03/23/2015  . Hypertension 2004  . Lung nodule seen on imaging study   . Nicotine addiction   . OA (osteoarthritis) of knee   . Obesity   . Sinusitis     Patient Active Problem List   Diagnosis Date Noted  . Seasonal allergies 05/06/2017  . Bloating symptom 03/15/2016  . CAD (coronary artery disease), native coronary artery 05/06/2014  . Gastric ulcer 03/23/2014  . Lung nodule seen on imaging study 02/16/2014  . Metabolic syndrome X [id number]  . OA (osteoarthritis) of knee 05/20/2013  . Constipation 02/12/2013  . GERD (gastroesophageal reflux disease) 02/12/2013  . Overweight (BMI 25.0-29.9) 02/12/2013  . COPD (chronic obstructive pulmonary disease) ([place]) 08/25/2011  . Vitamin D deficiency 11/23/2010  . BACK PAIN WITH RADICULOPATHY 08/22/2009  . Prediabetes 05/31/2008  . Hyperlipidemia LDL goal <100 03/02/2008  . NICOTINE ADDICTION 10/30/2007  . Essential hypertension 10/30/2007    Past Surgical History:  Procedure Laterality Date  . bilateral cataract surgery   7/27 & 03/01/09   Dr. Gershon Crane    . cholecystectomy    . COLONOSCOPY  04/2004   Dr. Leane Para Smith-->melanosis coli  . COLONOSCOPY WITH ESOPHAGOGASTRODUODENOSCOPY (EGD) N/A 02/25/2013   Dr. Fields:Normal mucosa in the terminal ileum/Severe melanosis throughout the entire examined colon/ONE COLON POLYP REMOVED (tubular adenoma)/Mild diverticulosis  in the sigmoid colon/EGD:The mucosa of the esophagus appeared normal/Non-erosive gastritis, empiric dilation with Savary dilator  . ESOPHAGOGASTRODUODENOSCOPY  11/07/2010   Jenkins:hiatal hernia/no evidence of Barrett esophagitis.  The Z-line was noted to be at 39 cm from the teeth. CLO test negative  . ESOPHAGOGASTRODUODENOSCOPY N/A 09/11/2013   Dr.Fields-  incomplete esophageal web in mid-esophagus, medium sized hialtal hernia, PUD. bx=focal erosion with inflammation and fibrosis  . ESOPHAGOGASTRODUODENOSCOPY N/A 07/06/2014   healed ulcers, persistent erosions on aspirin, small hiatal hernia  . knee arthroscopy right    . LEFT HEART CATHETERIZATION WITH CORONARY ANGIOGRAM N/A 04/08/2014   Procedure: LEFT HEART CATHETERIZATION WITH CORONARY ANGIOGRAM;  Surgeon: Sinclair Grooms, MD;  Location: [facility] CATH LAB;  Service: Cardiovascular;  Laterality: N/A;  . MALONEY DILATION N/A 02/25/2013   Procedure: Venia Minks DILATION;  Surgeon: Danie Binder, MD;  Location[facility];  Service: Endoscopy;  Laterality: N/A;  . PARTIAL HYSTERECTOMY    . SAVORY DILATION N/A 02/25/2013   Procedure: SAVORY DILATION;  Surgeon: Danie Binder, MD;  Location: [facility];  Service: Endoscopy;  Laterality: N/A;  . total knee arthroplasty right  05/29/2005   Dr. Aline Brochure  OB History    Gravida  4   Para  4   Term  4   Preterm      AB      Living  4     SAB      TAB      Ectopic      Multiple      Live Births               Home Medications    Prior to Admission medications   Medication Sig Start Date End Date Taking? Authorizing Provider  albuterol  (PROVENTIL HFA;VENTOLIN HFA) 108 (90 Base) MCG/ACT inhaler Inhale 2 puffs into the lungs every 6 (six) hours as needed for wheezing or shortness of breath. 02/24/18   Fayrene Helper, MD  ALOE VERA PO Take 500 mg by mouth as needed.     [provider]  amLODipine (NORVASC) 10 MG tablet Take 1 tablet (10 mg total) by mouth daily. 03/02/19   Fayrene Helper, MD  aspirin EC 81 MG tablet Take 81 mg by mouth daily.    [provider]  atorvastatin (LIPITOR) 20 MG tablet Take 1 tablet (20 mg total) by mouth daily. 05/06/19   Fayrene Helper, MD  Calcium Carbonate-Vitamin D (CALTRATE 600+D PO) Take 1 each by mouth daily.    [provider]  fluticasone (FLONASE) 50 MCG/ACT nasal spray Place 2 sprays into both nostrils daily as needed for allergies or rhinitis. 10/10/18   Fayrene Helper, MD  montelukast (SINGULAIR) 10 MG tablet Take 1 tablet (10 mg total) by mouth at bedtime. 06/15/19   Fayrene Helper, MD  pantoprazole (PROTONIX) 40 MG tablet Take 1 tablet (40 mg total) by mouth daily. 06/02/19   Fayrene Helper, MD  triamterene-hydrochlorothiazide (MAXZIDE-25) 37.5-25 MG tablet Take 1 tablet by mouth daily. 04/27/19   Fayrene Helper, MD    Family History Family History  Problem Relation Age of Onset  . Diabetes Mother   . Hypertension Mother   . Coronary artery disease Mother   . Pneumonia Father   . Hypertension Sister   . Hypertension Sister   . Hypertension Brother        Psychologist, forensic  . Diabetes Brother   . Hypertension Brother   . Colon cancer Neg Hx   . Liver disease Neg Hx     Social History Social History   Tobacco Use  . Smoking status: Current Some Day Smoker    Packs/day: 0.10    Years: 10.00    Pack years: 1.00    Types: Cigarettes    Start date: 07/23/1962  . Smokeless tobacco: Never Used  . Tobacco comment: 1 per day   Substance Use Topics  . Alcohol use: No    Alcohol/week: 0.0 standard drinks  . Drug use: No      Allergies   Patient has no known allergies.   Review of Systems Review of Systems  Constitutional: Negative.   HENT: Positive for sinus pressure and sinus pain.   Respiratory: Negative.   Cardiovascular: Negative.   Gastrointestinal: Negative.   Neurological: Negative.   ROS: All other are negative   Physical Exam Triage Vital Signs ED Triage Vitals [07/21/19 1413]  Enc Vitals Group     BP (!) 171/78     Pulse Rate 95     Resp 15     Temp 98.3 F (36.8 C)     Temp Source Oral  SpO2 95 %     Weight      Height      Head Circumference      Peak Flow      Pain Score      Pain Loc      Pain Edu?      Excl. in [place]?    No data found.  Updated Vital Signs BP (!) 171/78 (BP Location: Right Arm)   Pulse 95   Temp 98.3 F (36.8 C) (Oral)   Resp 15   SpO2 95%   Visual Acuity Right Eye Distance:   Left Eye Distance:   Bilateral Distance:    Right Eye Near:   Left Eye Near:    Bilateral Near:     Physical Exam Vitals and nursing note reviewed.  Constitutional:      General: She is not in acute distress.    Appearance: Normal appearance. She is normal weight. She is not ill-appearing, toxic-appearing or diaphoretic.  HENT:     Head: Normocephalic.     Right Ear: Tympanic membrane, ear canal and external ear normal. There is no impacted cerumen.     Left Ear: Tympanic membrane, ear canal and external ear normal. There is no impacted cerumen.     Nose: Nose normal. No congestion.     Mouth/Throat:     Mouth: Mucous membranes are moist.     Pharynx: No oropharyngeal exudate or posterior oropharyngeal erythema.  Cardiovascular:     Rate and Rhythm: Normal rate and regular rhythm.     Pulses: Normal pulses.     Heart sounds: Normal heart sounds. No murmur.  Pulmonary:     Effort: Pulmonary effort is normal. No respiratory distress.     Breath sounds: Normal breath sounds. No wheezing or rhonchi.  Chest:     Chest wall: No tenderness.  Abdominal:      General: Abdomen is flat. Bowel sounds are normal. There is no distension.     Palpations: There is no mass.     Tenderness: There is no abdominal tenderness. There is no right CVA tenderness, left CVA tenderness, guarding or rebound.     Hernia: No hernia is present.  Skin:    Capillary Refill: Capillary refill takes less than 2 seconds.  Neurological:     Mental Status: She is alert and oriented to person, place, and time.      UC Treatments / Results  Labs (all labs ordered are listed, but only abnormal results are displayed) Labs Reviewed - No data to display  EKG   Radiology No results found.  Procedures Procedures (including critical care time)  Medications Ordered in UC Medications - No data to display  Initial Impression / Assessment and Plan / UC Course  I have reviewed the triage vital signs and the nursing notes.  Pertinent labs & imaging results that were available during my care of the patient were reviewed by me and considered in my medical decision making (see chart for details).   Patient stable for discharge.  Benign physical exam.  Advised patient to quarantine until COVID-19 test results become available.  Diflucan prescribed for yeast infection.  To go to ED for worsening of symptoms.  Patient verbalized understanding of the plan of care.   Final Clinical Impressions(s) / UC Diagnoses   Final diagnoses:  Suspected COVID-19 virus infection     Discharge Instructions     COVID testing ordered.  It will take between 2-7 days for test  results.  Someone will contact you regarding abnormal results.    In the meantime: You should remain isolated in your home for 10 days from symptom onset Get plenty of rest and push fluids Use OTC medications like ibuprofen or tylenol as needed fever or pain Call or go to the ED if you have any new or worsening symptoms such as fever, worsening cough, shortness of breath, chest tightness, chest pain, turning blue,  changes in mental status, etc...     ED Prescriptions    None     PDMP not reviewed this encounter.   Emerson Monte, FNP 07/21/19 1500

## 2019-07-21 NOTE — Discharge Instructions (Addendum)
COVID testing ordered.  It will take between 2-7 days for test results.  Someone will contact you regarding abnormal results.    In the meantime: You should remain isolated in your home for 10 days from symptom onset Get plenty of rest and push fluids Use OTC medications like ibuprofen or tylenol as needed fever or pain Call or go to the ED if you have any new or worsening symptoms such as fever, worsening cough, shortness of breath, chest tightness, chest pain, turning blue, changes in mental status, etc..Marland Kitchen

## 2019-07-21 NOTE — Telephone Encounter (Signed)
Patient has a sinus infection above her eyes hurt since Christmas Eve. She also has a yeast infection that started 07/18/19. Would like medicine called in for this please. Patient is aware you are unavailable until 07/22/2019. Has been advised to seek evaluation at urgent care for her symptoms.

## 2019-07-22 LAB — NOVEL CORONAVIRUS, NAA: SARS-CoV-2, NAA: NOT DETECTED

## 2019-08-17 ENCOUNTER — Ambulatory Visit
Admission: EM | Admit: 2019-08-17 | Discharge: 2019-08-17 | Disposition: A | Discharge status: Home / Self Care | Payer: Medicare Other | Attending: Emergency Medicine | Admitting: Emergency Medicine

## 2019-08-17 ENCOUNTER — Other Ambulatory Visit: Payer: Self-pay

## 2019-08-17 DIAGNOSIS — Z20822 Contact with and (suspected) exposure to covid-19: Secondary | ICD-10-CM

## 2019-08-17 DIAGNOSIS — Z1152 Encounter for screening for COVID-19: Secondary | ICD-10-CM

## 2019-08-17 NOTE — ED Provider Notes (Signed)
[facility]    CSN: [id number] Arrival date & time: 08/17/19  0857      History   Chief Complaint No chief complaint on file.   HPI [name] is a 79 y.o. female.   [name] 79 years old female presented to the urgent care for complaint of positive Covid exposure for the past 3-4 days. Denies sick exposure to  flu or strep.  Denies recent travel.  Denies aggravating or alleviating symptoms.  Denies previous COVID infection.   Denies fever, chills, fatigue, nasal congestion, rhinorrhea, sore throat, cough, SOB, wheezing, chest pain, nausea, vomiting, changes in bowel or bladder habits.    The history is provided by the patient. No language interpreter was used.    Past Medical History:  Diagnosis Date  . COPD (chronic obstructive pulmonary disease) ([place])   . Hiatal hernia without gangrene and obstruction 03/23/2015  . Hypertension 2004  . Lung nodule seen on imaging study   . Nicotine addiction   . OA (osteoarthritis) of knee   . Obesity   . Sinusitis     Patient Active Problem List   Diagnosis Date Noted  . Seasonal allergies 05/06/2017  . Bloating symptom 03/15/2016  . CAD (coronary artery disease), native coronary artery 05/06/2014  . Gastric ulcer 03/23/2014  . Lung nodule seen on imaging study 02/16/2014  . Metabolic syndrome X [id number]  . OA (osteoarthritis) of knee 05/20/2013  . Constipation 02/12/2013  . GERD (gastroesophageal reflux disease) 02/12/2013  . Overweight (BMI 25.0-29.9) 02/12/2013  . COPD (chronic obstructive pulmonary disease) ([place]) 08/25/2011  . Vitamin D deficiency 11/23/2010  . BACK PAIN WITH RADICULOPATHY 08/22/2009  . Prediabetes 05/31/2008  . Hyperlipidemia LDL goal <100 03/02/2008  . NICOTINE ADDICTION 10/30/2007  . Essential hypertension 10/30/2007    Past Surgical History:  Procedure Laterality Date  . bilateral cataract surgery  7/27 & 03/01/09   Dr. Gershon Crane    . cholecystectomy    . COLONOSCOPY  04/2004     Dr. Leane Para Smith-->melanosis coli  . COLONOSCOPY WITH ESOPHAGOGASTRODUODENOSCOPY (EGD) N/A 02/25/2013   Dr. Fields:Normal mucosa in the terminal ileum/Severe melanosis throughout the entire examined colon/ONE COLON POLYP REMOVED (tubular adenoma)/Mild diverticulosis  in the sigmoid colon/EGD:The mucosa of the esophagus appeared normal/Non-erosive gastritis, empiric dilation with Savary dilator  . ESOPHAGOGASTRODUODENOSCOPY  11/07/2010   Jenkins:hiatal hernia/no evidence of Barrett esophagitis.  The Z-line was noted to be at 39 cm from the teeth. CLO test negative  . ESOPHAGOGASTRODUODENOSCOPY N/A 09/11/2013   Dr.Fields-  incomplete esophageal web in mid-esophagus, medium sized hialtal hernia, PUD. bx=focal erosion with inflammation and fibrosis  . ESOPHAGOGASTRODUODENOSCOPY N/A 07/06/2014   healed ulcers, persistent erosions on aspirin, small hiatal hernia  . knee arthroscopy right    . LEFT HEART CATHETERIZATION WITH CORONARY ANGIOGRAM N/A 04/08/2014   Procedure: LEFT HEART CATHETERIZATION WITH CORONARY ANGIOGRAM;  Surgeon: Sinclair Grooms, MD;  Location: [facility] CATH LAB;  Service: Cardiovascular;  Laterality: N/A;  . MALONEY DILATION N/A 02/25/2013   Procedure: Venia Minks DILATION;  Surgeon: Danie Binder, MD;  Location: [facility];  Service: Endoscopy;  Laterality: N/A;  . PARTIAL HYSTERECTOMY    . SAVORY DILATION N/A 02/25/2013   Procedure: SAVORY DILATION;  Surgeon: Danie Binder, MD;  Location: [facility];  Service: Endoscopy;  Laterality: N/A;  . total knee arthroplasty right  05/29/2005   Dr. Aline Brochure     OB History    Gravida  4   Para  4  Term  4   Preterm      AB      Living  4     SAB      TAB      Ectopic      Multiple      Live Births               Home Medications    Prior to Admission medications   Medication Sig Start Date End Date Taking? Authorizing Provider  albuterol (PROVENTIL HFA;VENTOLIN HFA) 108 (90 Base) MCG/ACT inhaler Inhale 2 puffs into  the lungs every 6 (six) hours as needed for wheezing or shortness of breath. 02/24/18   Fayrene Helper, MD  ALOE VERA PO Take 500 mg by mouth as needed.     [provider]  amLODipine (NORVASC) 10 MG tablet Take 1 tablet (10 mg total) by mouth daily. 03/02/19   Fayrene Helper, MD  aspirin EC 81 MG tablet Take 81 mg by mouth daily.    [provider]  atorvastatin (LIPITOR) 20 MG tablet Take 1 tablet (20 mg total) by mouth daily. 05/06/19   Fayrene Helper, MD  Calcium Carbonate-Vitamin D (CALTRATE 600+D PO) Take 1 each by mouth daily.    [provider]  fluticasone (FLONASE) 50 MCG/ACT nasal spray Place 2 sprays into both nostrils daily as needed for allergies or rhinitis. 10/10/18   Fayrene Helper, MD  montelukast (SINGULAIR) 10 MG tablet Take 1 tablet (10 mg total) by mouth at bedtime. 06/15/19   Fayrene Helper, MD  pantoprazole (PROTONIX) 40 MG tablet Take 1 tablet (40 mg total) by mouth daily. 06/02/19   Fayrene Helper, MD  triamterene-hydrochlorothiazide (MAXZIDE-25) 37.5-25 MG tablet Take 1 tablet by mouth daily. 04/27/19   Fayrene Helper, MD    Family History Family History  Problem Relation Age of Onset  . Diabetes Mother   . Hypertension Mother   . Coronary artery disease Mother   . Pneumonia Father   . Hypertension Sister   . Hypertension Sister   . Hypertension Brother        Psychologist, forensic  . Diabetes Brother   . Hypertension Brother   . Colon cancer Neg Hx   . Liver disease Neg Hx     Social History Social History   Tobacco Use  . Smoking status: Current Some Day Smoker    Packs/day: 0.10    Years: 10.00    Pack years: 1.00    Types: Cigarettes    Start date: 07/23/1962  . Smokeless tobacco: Never Used  . Tobacco comment: 1 per day   Substance Use Topics  . Alcohol use: No    Alcohol/week: 0.0 standard drinks  . Drug use: No     Allergies   Patient has no known allergies.   Review of Systems Review  of Systems  Constitutional: Negative.   HENT: Negative.   Respiratory: Negative.   Cardiovascular: Negative.   Gastrointestinal: Negative.   Neurological: Negative.      Physical Exam Triage Vital Signs ED Triage Vitals [08/17/19 0913]  Enc Vitals Group     BP (!) 144/88     Pulse Rate 92     Resp 17     Temp 98.2 F (36.8 C)     Temp Source Oral     SpO2 97 %     Weight      Height      Head Circumference  Peak Flow      Pain Score      Pain Loc      Pain Edu?      Excl. in [place]?    No data found.  Updated Vital Signs BP (!) 144/88 (BP Location: Right Arm)   Pulse 92   Temp 98.2 F (36.8 C) (Oral)   Resp 17   SpO2 97%   Visual Acuity Right Eye Distance:   Left Eye Distance:   Bilateral Distance:    Right Eye Near:   Left Eye Near:    Bilateral Near:     Physical Exam Vitals and nursing note reviewed.  Constitutional:      General: She is not in acute distress.    Appearance: Normal appearance. She is normal weight. She is not ill-appearing or toxic-appearing.  HENT:     Head: Normocephalic.     Right Ear: Tympanic membrane, ear canal and external ear normal. There is no impacted cerumen.     Left Ear: Tympanic membrane, ear canal and external ear normal. There is no impacted cerumen.     Nose: Nose normal. No congestion.     Mouth/Throat:     Mouth: Mucous membranes are moist.     Pharynx: Oropharynx is clear. No oropharyngeal exudate or posterior oropharyngeal erythema.  Cardiovascular:     Rate and Rhythm: Normal rate and regular rhythm.     Pulses: Normal pulses.     Heart sounds: Normal heart sounds. No murmur.  Pulmonary:     Effort: Pulmonary effort is normal. No respiratory distress.     Breath sounds: Normal breath sounds. No wheezing or rhonchi.  Chest:     Chest wall: No tenderness.  Abdominal:     General: Abdomen is flat. Bowel sounds are normal. There is no distension.     Palpations: There is no mass.     Tenderness: There is  no abdominal tenderness.  Skin:    Capillary Refill: Capillary refill takes less than 2 seconds.  Neurological:     General: No focal deficit present.     Mental Status: She is alert and oriented to person, place, and time.      UC Treatments / Results  Labs (all labs ordered are listed, but only abnormal results are displayed) Labs Reviewed  NOVEL CORONAVIRUS, NAA    EKG   Radiology No results found.  Procedures Procedures (including critical care time)  Medications Ordered in UC Medications - No data to display  Initial Impression / Assessment and Plan / UC Course  I have reviewed the triage vital signs and the nursing notes.  Pertinent labs & imaging results that were available during my care of the patient were reviewed by me and considered in my medical decision making (see chart for details).    COVID-19 test was ordered Advised patient to quarantine To go to ED for worsening of symptoms Patient verbalized understanding of the plan of care  Final Clinical Impressions(s) / UC Diagnoses   Final diagnoses:  Encounter for screening for COVID-19     Discharge Instructions     COVID testing ordered.  It will take between 2-7 days for test results.  Someone will contact you regarding abnormal results.    In the meantime: You should remain isolated in your home for 10 days from symptom onset AND greater than 72 hours after symptoms resolution (absence of fever without the use of fever-reducing medication and improvement in respiratory symptoms), whichever is longer  Get plenty of rest and push fluids Use medications daily for symptom relief Use OTC medications like ibuprofen or tylenol as needed fever or pain Call or go to the ED if you have any new or worsening symptoms such as fever, worsening cough, shortness of breath, chest tightness, chest pain, turning blue, changes in mental status, etc...     ED Prescriptions    None     PDMP not reviewed this  encounter.   Emerson Monte, [place] 08/17/19 [phone]

## 2019-08-17 NOTE — ED Triage Notes (Signed)
Pt here for covid test after positive exposure  

## 2019-08-17 NOTE — Discharge Instructions (Addendum)

## 2019-08-19 LAB — NOVEL CORONAVIRUS, NAA: SARS-CoV-2, NAA: DETECTED — AB

## 2019-08-20 ENCOUNTER — Ambulatory Visit (INDEPENDENT_AMBULATORY_CARE_PROVIDER_SITE_OTHER): Payer: Medicare Other | Admitting: Family Medicine

## 2019-08-20 ENCOUNTER — Encounter: Payer: Self-pay | Admitting: Family Medicine

## 2019-08-20 ENCOUNTER — Telehealth: Payer: Self-pay | Admitting: Nurse Practitioner

## 2019-08-20 ENCOUNTER — Other Ambulatory Visit: Payer: Self-pay

## 2019-08-20 DIAGNOSIS — J329 Chronic sinusitis, unspecified: Secondary | ICD-10-CM | POA: Diagnosis not present

## 2019-08-20 DIAGNOSIS — U071 COVID-19: Secondary | ICD-10-CM | POA: Diagnosis not present

## 2019-08-20 NOTE — Progress Notes (Signed)
Virtual Visit via Telephone Note   This visit type was conducted due to national recommendations for restrictions regarding the COVID-19 Pandemic (e.g. social distancing) in an effort to limit this patient's exposure and mitigate transmission in our community.  Due to her co-morbid illnesses, this patient is at least at moderate risk for complications without adequate follow up.  This format is felt to be most appropriate for this patient at this time.  The patient did not have access to video technology/had technical difficulties with video requiring transitioning to audio format only (telephone).  All issues noted in this document were discussed and addressed.  No physical exam could be performed with this format.    Evaluation Performed:  Follow-up visit  Date:  08/21/2019   ID:  Laura, Hurley 08/28/1940, MRN [id number]  Patient Location: Home Provider Location: Office  Location of Patient: Home Location of Provider: Telehealth Consent was obtain for visit to be over via telehealth. I verified that I am speaking with the correct person using two identifiers.  PCP:  Fayrene Helper, MD   Chief Complaint:  Covid + chronic sinus   History of Present Illness:    [name] is a 79 y.o. female with history of sinusitis chronic in nature and COPD. Recently COVID tested and positive. Reports she developed sinus trouble right after christmas was treating with OTC meds including decongestant. Pharmacist advised her to call the office and to stop the decongestant.  She has stopped this to keep BP controlled. Reports doing better, but can not recall her last reading. Denies other S&S of Covid at this time.   She reports her employer was + last week, but they delayed telling her they were tested until this Monday. She immediately went to get tested.  The patient does not have symptoms concerning for COVID-19 infection (fever, chills, cough, or new shortness of breath).   Past  Medical, Surgical, Social History, Allergies, and Medications have been Reviewed.  Past Medical History:  Diagnosis Date  . COPD (chronic obstructive pulmonary disease) ([place])   . Hiatal hernia without gangrene and obstruction 03/23/2015  . Hypertension 2004  . Lung nodule seen on imaging study   . Nicotine addiction   . OA (osteoarthritis) of knee   . Obesity   . Sinusitis    Past Surgical History:  Procedure Laterality Date  . bilateral cataract surgery  7/27 & 03/01/09   Dr. Gershon Crane    . cholecystectomy    . COLONOSCOPY  04/2004   Dr. Leane Para Smith-->melanosis coli  . COLONOSCOPY WITH ESOPHAGOGASTRODUODENOSCOPY (EGD) N/A 02/25/2013   Dr. Fields:Normal mucosa in the terminal ileum/Severe melanosis throughout the entire examined colon/ONE COLON POLYP REMOVED (tubular adenoma)/Mild diverticulosis  in the sigmoid colon/EGD:The mucosa of the esophagus appeared normal/Non-erosive gastritis, empiric dilation with Savary dilator  . ESOPHAGOGASTRODUODENOSCOPY  11/07/2010   Jenkins:hiatal hernia/no evidence of Barrett esophagitis.  The Z-line was noted to be at 39 cm from the teeth. CLO test negative  . ESOPHAGOGASTRODUODENOSCOPY N/A 09/11/2013   Dr.Fields-  incomplete esophageal web in mid-esophagus, medium sized hialtal hernia, PUD. bx=focal erosion with inflammation and fibrosis  . ESOPHAGOGASTRODUODENOSCOPY N/A 07/06/2014   healed ulcers, persistent erosions on aspirin, small hiatal hernia  . knee arthroscopy right    . LEFT HEART CATHETERIZATION WITH CORONARY ANGIOGRAM N/A 04/08/2014   Procedure: LEFT HEART CATHETERIZATION WITH CORONARY ANGIOGRAM;  Surgeon: Sinclair Grooms, MD;  Location[facility] CATH LAB;  Service: Cardiovascular;  Laterality: N/A;  .  MALONEY DILATION N/A 02/25/2013   Procedure: MALONEY DILATION;  Surgeon: Danie Binder, MD;  [facility] ENDO [facility];  Service: Endoscopy;  Laterality: N/A;  . PARTIAL HYSTERECTOMY    . SAVORY DILATION N/A 02/25/2013   Procedure: SAVORY DILATION;   Surgeon: Danie Binder, MD;  Location: [facility] ENDO SU[facility];  Service: Endoscopy;  Laterality: N/A;  . total knee arthroplasty right  05/29/2005   Dr. Aline Brochure      Current Meds  Medication Sig  . albuterol (PROVENTIL HFA;VENTOLIN HFA) 108 (90 Base) MCG/ACT inhaler Inhale 2 puffs into the lungs every 6 (six) hours as needed for wheezing or shortness of breath.  . ALOE VERA PO Take 500 mg by mouth as needed.   Marland Kitchen amLODipine (NORVASC) 10 MG tablet Take 1 tablet (10 mg total) by mouth daily.  Marland Kitchen aspirin EC 81 MG tablet Take 81 mg by mouth daily.  Marland Kitchen atorvastatin (LIPITOR) 20 MG tablet Take 1 tablet (20 mg total) by mouth daily.  . Calcium Carbonate-Vitamin D (CALTRATE 600+D PO) Take 1 each by mouth daily.  . fluticasone (FLONASE) 50 MCG/ACT nasal spray Place 2 sprays into both nostrils daily as needed for allergies or rhinitis.  Marland Kitchen montelukast (SINGULAIR) 10 MG tablet Take 1 tablet (10 mg total) by mouth at bedtime.  . pantoprazole (PROTONIX) 40 MG tablet Take 1 tablet (40 mg total) by mouth daily.  Marland Kitchen triamterene-hydrochlorothiazide (MAXZIDE-25) 37.5-25 MG tablet Take 1 tablet by mouth daily.     Allergies:   Patient has no known allergies.   ROS:   Please see the history of present illness.    All other systems reviewed and are negative.   Labs/Other Tests and Data Reviewed:    Recent Labs: 04/28/2019: ALT 6; BUN 12; Creatinine, Ser 1.27; Hemoglobin 13.1; Platelets 330; Potassium 3.8; Sodium 141; TSH 0.714   Recent Lipid Panel Lab Results  Component Value Date/Time   CHOL 240 (H) 04/28/2019 09:28 AM   TRIG 158 (H) 04/28/2019 09:28 AM   HDL 50 04/28/2019 09:28 AM   CHOLHDL 4.1 04/23/2018 11:34 AM   CHOLHDL 4.1 07/04/2016 12:04 PM   LDLCALC 161 (H) 04/28/2019 09:28 AM    Wt Readings from Last 3 Encounters:  06/15/19 185 lb (83.9 kg)  06/02/19 186 lb (84.4 kg)  05/26/19 186 lb 9.6 oz (84.6 kg)     Objective:    Vital Signs:  There were no vitals taken for this visit.   GEN:  alert  and oriented  RESPIRATORY:  no shortness of breath in conversation  PSYCH:  normal affect and mood  ASSESSMENT & PLAN:    1. Recurrent sinusitis   2. COVID-19 virus infection   Time:   Today, I have spent 15 minutes with the patient with telehealth technology discussing the above problems.     Medication Adjustments/Labs and Tests Ordered: Current medicines are reviewed at length with the patient today.  Concerns regarding medicines are outlined above.   Tests Ordered: No orders of the defined types were placed in this encounter.   Medication Changes: Meds ordered this encounter  Medications  . azithromycin (ZITHROMAX) 250 MG tablet    Sig: Take 2 tablets (500 mg) on the first day. Then take 1 tablet (250mg ) on days 2-5.    Dispense:  6 tablet    Refill:  0    Order Specific Question:   Supervising Provider    Answer:   Fayrene Helper [id number]    Disposition:  Follow up 09/09/2019  Signed, Perlie Mayo, NP  08/21/2019 9:32 PM     [facility]

## 2019-08-20 NOTE — Telephone Encounter (Signed)
Called to Discuss with patient about Covid symptoms and the use of bamlanivimab, a monoclonal antibody infusion for those with mild to moderate Covid symptoms and at a high risk of hospitalization.     Pt states that she has chronic sinus symptoms, but has not had any new symptoms related to recent diagnosis of COVID. She will call back if her symptoms worsen.

## 2019-08-21 ENCOUNTER — Encounter: Payer: Self-pay | Admitting: Family Medicine

## 2019-08-21 DIAGNOSIS — U071 COVID-19: Secondary | ICD-10-CM | POA: Insufficient documentation

## 2019-08-21 MED ORDER — AZITHROMYCIN 250 MG PO TABS: ORAL_TABLET | ORAL | 0 refills | Status: DC

## 2019-08-21 NOTE — Assessment & Plan Note (Signed)
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 She reports they did not inform her until this past Monday. She was then tested.  She is in isolation. Outpatient and not having breathing trouble. Outpt infusion reached out she will qualify if she develops worsening symptoms.  Patient acknowledged agreement and understanding of the plan.

## 2019-08-21 NOTE — Patient Instructions (Signed)
Happy New Year! May you have a year filled with hope, love, happiness and laughter.  I appreciate the opportunity to provide you with care for your health and wellness. Today we discussed: sinus infection and covid  Follow up: 09/09/2019 as scheduled  No labs or referrals today  We will give work note if needed.  You need to remain in isolation for 14 days from positive test date. Once that ends please call the office and let them know if you want the work note we discussed.  Please continue to practice social distancing to keep you, your family, and our community safe.  If you must go out, please wear a mask and practice good handwashing.  It was a pleasure to see you and I look forward to continuing to work together on your health and well-being. Please do not hesitate to call the office if you need care or have questions about your care.  Have a wonderful day and week. With Gratitude, Cherly Beach, DNP, AGNP-BC

## 2019-08-21 NOTE — Assessment & Plan Note (Signed)
Zpak prescribed. Saline spray encouraged. Might need to start nasal flushes since this is recurrent. Encouraged water to thin mucus.

## 2019-08-25 ENCOUNTER — Other Ambulatory Visit: Payer: Self-pay | Admitting: Family Medicine

## 2019-09-01 ENCOUNTER — Other Ambulatory Visit: Payer: Self-pay | Admitting: Family Medicine

## 2019-09-03 ENCOUNTER — Other Ambulatory Visit: Payer: Self-pay | Admitting: Family Medicine

## 2019-09-03 DIAGNOSIS — E785 Hyperlipidemia, unspecified: Secondary | ICD-10-CM | POA: Diagnosis not present

## 2019-09-03 DIAGNOSIS — I10 Essential (primary) hypertension: Secondary | ICD-10-CM | POA: Diagnosis not present

## 2019-09-04 LAB — COMPREHENSIVE METABOLIC PANEL
ALT: 8 IU/L (ref 0–32)
AST: 15 IU/L (ref 0–40)
Albumin/Globulin Ratio: 1.2 (ref 1.2–2.2)
Albumin: 4.2 g/dL (ref 3.7–4.7)
Alkaline Phosphatase: 144 IU/L — ABNORMAL HIGH (ref 39–117)
BUN/Creatinine Ratio: 11 — ABNORMAL LOW (ref 12–28)
BUN: 12 mg/dL (ref 8–27)
Bilirubin Total: 0.6 mg/dL (ref 0.0–1.2)
CO2: 26 mmol/L (ref 20–29)
Calcium: 9.5 mg/dL (ref 8.7–10.3)
Chloride: 98 mmol/L (ref 96–106)
Creatinine, Ser: 1.14 mg/dL — ABNORMAL HIGH (ref 0.57–1.00)
GFR calc Af Amer: 54 mL/min/{1.73_m2} — ABNORMAL LOW (ref >59)
GFR calc non Af Amer: 47 mL/min/{1.73_m2} — ABNORMAL LOW (ref >59)
Globulin, Total: 3.5 g/dL (ref 1.5–4.5)
Glucose: 110 mg/dL — ABNORMAL HIGH (ref 65–99)
Potassium: 4 mmol/L (ref 3.5–5.2)
Sodium: 141 mmol/L (ref 134–144)
Total Protein: 7.7 g/dL (ref 6.0–8.5)

## 2019-09-04 LAB — LIPID PANEL W/O CHOL/HDL RATIO
Cholesterol, Total: 167 mg/dL (ref 100–199)
HDL: 52 mg/dL (ref >39)
LDL Chol Calc (NIH): 97 mg/dL (ref 0–99)
Triglycerides: 101 mg/dL (ref 0–149)
VLDL Cholesterol Cal: 18 mg/dL (ref 5–40)

## 2019-09-04 LAB — SPECIMEN STATUS REPORT

## 2019-09-09 ENCOUNTER — Encounter: Payer: Medicare Other | Admitting: Family Medicine

## 2019-09-24 ENCOUNTER — Other Ambulatory Visit: Payer: Self-pay

## 2019-09-24 ENCOUNTER — Ambulatory Visit (INDEPENDENT_AMBULATORY_CARE_PROVIDER_SITE_OTHER): Payer: Medicare Other | Admitting: Family Medicine

## 2019-09-24 ENCOUNTER — Ambulatory Visit (HOSPITAL_COMMUNITY)
Admission: RE | Admit: 2019-09-24 | Discharge: 2019-09-24 | Disposition: A | Discharge status: Home / Self Care | Payer: Medicare Other | Source: Ambulatory Visit | Attending: Family Medicine | Admitting: Family Medicine

## 2019-09-24 ENCOUNTER — Encounter: Payer: Self-pay | Admitting: Family Medicine

## 2019-09-24 VITALS — BP 136/72 | HR 92 | Temp 98.2°F | Resp 16 | Ht 67.0 in | Wt 183.0 lb

## 2019-09-24 DIAGNOSIS — J329 Chronic sinusitis, unspecified: Secondary | ICD-10-CM | POA: Insufficient documentation

## 2019-09-24 DIAGNOSIS — R1013 Epigastric pain: Secondary | ICD-10-CM | POA: Diagnosis not present

## 2019-09-24 DIAGNOSIS — Z0000 Encounter for general adult medical examination without abnormal findings: Secondary | ICD-10-CM | POA: Diagnosis not present

## 2019-09-24 DIAGNOSIS — R109 Unspecified abdominal pain: Secondary | ICD-10-CM | POA: Insufficient documentation

## 2019-09-24 DIAGNOSIS — Z0001 Encounter for general adult medical examination with abnormal findings: Secondary | ICD-10-CM

## 2019-09-24 MED ORDER — AZITHROMYCIN 250 MG PO TABS: ORAL_TABLET | ORAL | 0 refills | Status: DC

## 2019-09-24 NOTE — Assessment & Plan Note (Signed)

## 2019-09-24 NOTE — Patient Instructions (Addendum)
F?u in office in 5 months, call if you need me sooner  Please set a quit date and stop smoking to improve your health  You are referred for Scan to evaluate weight loss, abnormal stoool and eabdominal pain  You are also referred to Dr Oneida Alar  Please get X ray of sinus today due to c/o chronic / recurrent infection, also azithromycin is prescribed  YES, please get the Covid vaccine, anytime after the next 10 days since t you are currently being treated for sinus infection is good. Please get as soon as possible!  Thanks for choosing [facility], we consider it a privelige to serve you.

## 2019-09-24 NOTE — Assessment & Plan Note (Addendum)
Chronic complaint, will  Get x ray and treat with azithromycin

## 2019-09-26 NOTE — Progress Notes (Signed)
    [name]     MRN: [id number]      DOB: May 18, 1941  HPI: Patient is in for annual physical exam. 1 week h/o worsening head and chest congestion, associated with fever and chills intermittently. Nasal drainage has thickened , and is yellowish green, and at times bloody. Sputum is thick and yellow. C/o chest congestion with thick yellow sputum C/o abdominal pain, change in stool in past several months, and weight loss PE: BP 136/72   Pulse 92   Temp 98.2 F (36.8 C) (Temporal)   Resp 16   Ht 5\' 7"  (1.702 m)   Wt 183 lb (83 kg)   SpO2 97%   BMI 28.66 kg/m  Pleasant  female, alert and oriented x 3, in no cardio-pulmonary distress. Afebrile. HEENT No facial trauma or asymetry. Maxillary sinus  tender.  Extra occullar muscles intact..cervial adenitis External ears normal, . Neck: supple, no adenopathy,JVD or thyromegaly.No bruits.  Chest: Clear to ascultation, adequate air entry Non tender to palpation  Breast:not examined  Cardiovascular system; Heart sounds normal,  S1 and  S2 ,no S3.  No murmur, or thrill. Apical beat not displaced Peripheral pulses normal.  Abdomen: Soft, mild epigastric tenderness,  no organomegaly or masses. No bruits. Bowel sounds normal. No guarding, tenderness or rebound.      Musculoskeletal exam: decreased  ROM of spine, hips , shoulders and knees.  deformity ,swelling or crepitus noted. No muscle wasting or atrophy.   Neurologic: Cranial nerves 2 to 12 intact. Power, tone ,sensation and reflexes normal throughout. No disturbance in gait. No tremor.  Skin: Intact, no ulceration, erythema , scaling or rash noted. Pigmentation normal throughout  Psych; Normal mood and affect. Judgement and concentration normal   Assessment & Plan:   Annual physical exam Annual exam as documented. Counseling done  re healthy lifestyle involving commitment to 150 minutes exercise per week, heart healthy diet, and attaining healthy  weight.The importance of adequate sleep also discussed. Regular seat belt use and home safety, is also discussed. Changes in health habits are decided on by the patient with goals and time frames  set for achieving them. Immunization and cancer screening needs are specifically addressed at this visit.   Recurrent sinusitis Chronic complaint, will  Get x ray and treat with azithromycin  Abdominal pain Abdominal pain with unintentional weight loss and change in stool Needs scan and GI eval

## 2019-09-26 NOTE — Assessment & Plan Note (Signed)
Abdominal pain with unintentional weight loss and change in stool Needs scan and GI eval

## 2019-10-12 ENCOUNTER — Ambulatory Visit (HOSPITAL_COMMUNITY)
Admission: RE | Admit: 2019-10-12 | Discharge: 2019-10-12 | Disposition: A | Discharge status: Home / Self Care | Payer: Medicare Other | Source: Ambulatory Visit | Attending: Family Medicine | Admitting: Family Medicine

## 2019-10-12 ENCOUNTER — Other Ambulatory Visit: Payer: Self-pay

## 2019-10-12 DIAGNOSIS — R1013 Epigastric pain: Secondary | ICD-10-CM | POA: Diagnosis not present

## 2019-10-12 DIAGNOSIS — N2889 Other specified disorders of kidney and ureter: Secondary | ICD-10-CM | POA: Diagnosis not present

## 2019-10-12 MED ORDER — IOHEXOL 300 MG/ML  SOLN
100.0000 mL | Freq: Once | INTRAMUSCULAR | Status: AC | PRN
  Administered 2019-10-12: 100 mL via INTRAVENOUS

## 2019-11-24 ENCOUNTER — Telehealth: Payer: Self-pay | Admitting: *Deleted

## 2019-11-24 ENCOUNTER — Encounter: Payer: Self-pay | Admitting: Gastroenterology

## 2019-11-24 ENCOUNTER — Other Ambulatory Visit: Payer: Self-pay

## 2019-11-24 ENCOUNTER — Ambulatory Visit: Payer: Medicare Other | Admitting: Gastroenterology

## 2019-11-24 VITALS — BP 162/98 | HR 104 | Temp 97.3°F | Ht 67.0 in | Wt 184.2 lb

## 2019-11-24 DIAGNOSIS — R1013 Epigastric pain: Secondary | ICD-10-CM | POA: Insufficient documentation

## 2019-11-24 DIAGNOSIS — K5900 Constipation, unspecified: Secondary | ICD-10-CM

## 2019-11-24 DIAGNOSIS — K219 Gastro-esophageal reflux disease without esophagitis: Secondary | ICD-10-CM | POA: Diagnosis not present

## 2019-11-24 DIAGNOSIS — R748 Abnormal levels of other serum enzymes: Secondary | ICD-10-CM | POA: Diagnosis not present

## 2019-11-24 NOTE — Progress Notes (Signed)
Referring Provider: Fayrene Helper, MD Primary Care Physician:  Fayrene Helper, MD\ Primary GI: Dr. Oneida Alar   Chief Complaint  Patient presents with  . Gastroesophageal Reflux    doing okay  . Gas    taking gas x but not helping    HPI:   [name] is a 79 y.o. female presenting today with a history of PUD in remote past, GERD, constipation, with next colonoscopy in 2024 if benefits outweigh the risks.  Isolated mildly elevated alk phos noted since 2019. Fatty liver on CT recently. No elevated transaminases.   Notes gas and feels like it's trapped in her back on left side. Feels like if you could lay on it, a bag would pop. While turning in bed, can feel it move from one side to the other. Feels painful at times. Will drink a soda and burp, then feel release. Protonix once daily. No nausea. Has a good appetite. Feels food just lays in her upper abdomen and points to under sternum. Feels like if she could burp, it would move. Not painful. Drinks a bottle of water and feels like her abdomen puffs out. Protonix once daily. Takes tylenol prn but no other NSAIDs except for 81 mg aspirin daily. Feels like she is bloated and lots of air.   CT scan March 2021: pancreas normal, gallbladder absent, fatty liver. Stomach unremarkable.   Today 184, a year ago was 196. Highest 211 in 2018. States she has been trying to lose weight. Eats a lot of fruit: Oranges, apples, grapes, bananas. String beans, cabbage. Doesn't eat a lot of cabbage. Likes the long, skinny hotdogs that are not processed. Slaw with vinegar. Enjoys broccoli and cheese. Brussell sprouts.   Takes a supplemental fiber daily. Pills. Will have BM approximately daily. Denies constipation. No blood in stool.     Past Medical History:  Diagnosis Date  . COPD (chronic obstructive pulmonary disease) ([place])   . Hiatal hernia without gangrene and obstruction 03/23/2015  . Hypertension 2004  . Lung nodule seen on  imaging study   . Nicotine addiction   . OA (osteoarthritis) of knee   . Obesity   . Sinusitis     Past Surgical History:  Procedure Laterality Date  . bilateral cataract surgery  7/27 & 03/01/09   Dr. Gershon Crane    . cholecystectomy    . COLONOSCOPY  04/2004   Dr. Leane Para Smith-->melanosis coli  . COLONOSCOPY WITH ESOPHAGOGASTRODUODENOSCOPY (EGD) N/A 02/25/2013   Dr. Fields:Normal mucosa in the terminal ileum/Severe melanosis throughout the entire examined colon/ONE COLON POLYP REMOVED (tubular adenoma)/Mild diverticulosis  in the sigmoid colon/EGD:The mucosa of the esophagus appeared normal/Non-erosive gastritis, empiric dilation with Savary dilator  . ESOPHAGOGASTRODUODENOSCOPY  11/07/2010   Jenkins:hiatal hernia/no evidence of Barrett esophagitis.  The Z-line was noted to be at 39 cm from the teeth. CLO test negative  . ESOPHAGOGASTRODUODENOSCOPY N/A 09/11/2013   Dr.Fields-  incomplete esophageal web in mid-esophagus, medium sized hialtal hernia, PUD. bx=focal erosion with inflammation and fibrosis  . ESOPHAGOGASTRODUODENOSCOPY N/A 07/06/2014   healed ulcers, persistent erosions on aspirin, small hiatal hernia  . knee arthroscopy right    . LEFT HEART CATHETERIZATION WITH CORONARY ANGIOGRAM N/A 04/08/2014   Procedure: LEFT HEART CATHETERIZATION WITH CORONARY ANGIOGRAM;  Surgeon: Sinclair Grooms, MD;  Location: [facility] CATH LAB;  Service: Cardiovascular;  Laterality: N/A;  . Venia Minks DILATION N/A 02/25/2013   Procedure: Venia Minks DILATION;  Surgeon: Danie Binder, MD;  [facility] ENDO SU[facility];  Service: Endoscopy;  Laterality: N/A;  . PARTIAL HYSTERECTOMY    . SAVORY DILATION N/A 02/25/2013   Procedure: SAVORY DILATION;  Surgeon: Danie Binder, MD;  Location: [facility] END[facility];  Service: Endoscopy;  Laterality: N/A;  . total knee arthroplasty right  05/29/2005   Dr. Aline Brochure     Current Outpatient Medications  Medication Sig Dispense Refill  . ALOE VERA PO Take 500 mg by mouth as needed.     Marland Kitchen  amLODipine (NORVASC) 10 MG tablet TAKE 1 TABLET(10 MG) BY MOUTH DAILY 30 tablet 3  . aspirin EC 81 MG tablet Take 81 mg by mouth daily.    Marland Kitchen atorvastatin (LIPITOR) 20 MG tablet Take 1 tablet (20 mg total) by mouth daily. 90 tablet 1  . Calcium Carbonate-Vitamin D (CALTRATE 600+D PO) Take 1 each by mouth daily.    . fluticasone (FLONASE) 50 MCG/ACT nasal spray Place 2 sprays into both nostrils daily as needed for allergies or rhinitis. 16 g 2  . montelukast (SINGULAIR) 10 MG tablet Take 1 tablet (10 mg total) by mouth at bedtime. 30 tablet 3  . Multiple Vitamin (MULTIVITAMIN) tablet Take 1 tablet by mouth daily.    . Omega 3 1000 MG CAPS Take 1 capsule by mouth daily.    . pantoprazole (PROTONIX) 40 MG tablet Take 1 tablet (40 mg total) by mouth daily. 90 tablet 3  . triamterene-hydrochlorothiazide (MAXZIDE-25) 37.5-25 MG tablet TAKE 1 TABLET BY MOUTH DAILY 30 tablet 3   No current facility-administered medications for this visit.    Allergies as of 11/24/2019  . (No Known Allergies)    Family History  Problem Relation Age of Onset  . Diabetes Mother   . Hypertension Mother   . Coronary artery disease Mother   . Pneumonia Father   . Hypertension Sister   . Hypertension Sister   . Hypertension Brother        Psychologist, forensic  . Diabetes Brother   . Hypertension Brother   . Colon cancer Neg Hx   . Liver disease Neg Hx     Social History   Socioeconomic History  . Marital status: Widowed    Spouse name: Not on file  . Number of children: 4  . Years of education: Not on file  . Highest education level: Not on file  Occupational History  . Occupation: employed     Employer: RETIRED  Tobacco Use  . Smoking status: Current Some Day Smoker    Packs/day: 0.10    Years: 10.00    Pack years: 1.00    Types: Cigarettes    Start date: 07/23/1962  . Smokeless tobacco: Never Used  . Tobacco comment: 1 per day   Substance and Sexual Activity  . Alcohol use: No    Alcohol/week: 0.0  standard drinks  . Drug use: No  . Sexual activity: Never  Other Topics Concern  . Not on file  Social History Narrative  . Not on file   Social Determinants of Health   Financial Resource Strain:   . Difficulty of Paying Living Expenses:   Food Insecurity:   . Worried About Charity fundraiser in the Last Year:   . Arboriculturist in the Last Year:   Transportation Needs:   . Film/video editor (Medical):   Marland Kitchen Lack of Transportation (Non-Medical):   Physical Activity:   . Days of Exercise per Week:   . Minutes of Exercise per Session:  Stress:   . Feeling of Stress :   Social Connections:   . Frequency of Communication with Friends and Family:   . Frequency of Social Gatherings with Friends and Family:   . Attends Religious Services:   . Active Member of Clubs or Organizations:   . Attends Archivist Meetings:   Marland Kitchen Marital Status:     Review of Systems: Gen: see HPI CV: Denies chest pain, palpitations, syncope, peripheral edema, and claudication. Resp: Denies dyspnea at rest, cough, wheezing, coughing up blood, and pleurisy. GI: see HPI Derm: Denies rash, itching, dry skin Psych: Denies depression, anxiety, memory loss, confusion. No homicidal or suicidal ideation.  Heme: Denies bruising, bleeding, and enlarged lymph nodes.  Physical Exam: BP (!) 162/98   Pulse (!) 104   Temp (!) 97.3 F (36.3 C) (Oral)   Ht _0  (1.702 m)   Wt 184 lb 3.2 oz (83.6 kg)   BMI 28.85 kg/m  General:   Alert and oriented. No distress noted. Pleasant and cooperative.  Head:  Normocephalic and atraumatic. Eyes:  Conjuctiva clear without scleral icterus. Mouth:  Mask in place Lungs: clear bilaterally Cardiac: S1 S2 present without murmurs Abdomen:  +BS, soft, TTP epigastric and non-distended. No rebound or guarding. No HSM or masses noted. Msk:  Symmetrical without gross deformities. Normal posture. Extremities:  Without edema. Neurologic:  Alert and  oriented  x4 Psych:  Alert and cooperative. Normal mood and affect.  ASSESSMENT: CIGI BEGA is a 79 y.o. female presenting today with remote history of PUD and documented healing (2015), constipation, GERD, vague bloating and now with persistent bloating and documented weight loss.   Weight loss: slow progression over past few years, with highest in 211 range 2018, 196 a year ago, now 184. Although she notes changing her eating habits, she endorses vague UGI symptoms with a sensation of fullness and although not outright discomfort/epigastric pain reported, she is tender on exam. EGD in 2015 with ulcers and then documented healing, likely NSAID-induced. CT recently on file unrevealing. Denying NSAIDs other than 81 mg aspirin daily. Continue Protonix 40 mg daily. Doubt mesenteric ischemia, as she does not have outright pain. Gallbladder absent. Pursuing EGD.  Bloating/gas: likely dietary-related. Handout provided. Endorses many gas-causing foods.    Constipation: doing well with supplemental fiber.   GERD: continue Protonix once daily.   Isolated elevated alk phos: mild, fluctuating over past year. Normal transaminases. Known fatty liver disease. Repeat in 3 months. If remains elevated, pursue further evaluation.      PLAN:   Proceed with upper endoscopy in the near future with Dr. Gala Romney. The risks, benefits, and alternatives have been discussed in detail with patient. They have stated understanding and desire to proceed.   Protonix once daily  Repeat HFP in 3 months  Return to office in 4 months   Annitta Needs, PhD, [facility] Gastroenterology

## 2019-11-24 NOTE — Patient Instructions (Signed)
I have provided a gas/bloating handout. I believe some of your foods are causing your symptoms.  We are arranging an upper endoscopy in the near future with Dr. Gala Romney.  We will recheck your liver numbers in 3 months. We may need to do further tests after that.  We will see you back in 4 months!  I enjoyed seeing you again today! As you know, I value our relationship and want to provide genuine, compassionate, and quality care. I welcome your feedback. If you receive a survey regarding your visit,  I greatly appreciate you taking time to fill this out. See you next time!  Annitta Needs, PhD, ANP-BC [facility] Gastroenterology

## 2019-11-24 NOTE — Telephone Encounter (Signed)
PA for EGD approved via [facility] website. Auth# [id number] dates 12/25/2019-03/24/2020

## 2019-12-07 ENCOUNTER — Other Ambulatory Visit: Payer: Self-pay | Admitting: *Deleted

## 2019-12-07 MED ORDER — ALBUTEROL SULFATE HFA 108 (90 BASE) MCG/ACT IN AERS: 2.0000 | INHALATION_SPRAY | Freq: Four times a day (QID) | RESPIRATORY_TRACT | 0 refills | Status: DC | PRN

## 2019-12-22 ENCOUNTER — Other Ambulatory Visit: Payer: Self-pay | Admitting: Family Medicine

## 2019-12-23 ENCOUNTER — Other Ambulatory Visit: Payer: Self-pay

## 2019-12-23 ENCOUNTER — Other Ambulatory Visit (HOSPITAL_COMMUNITY)
Admission: RE | Admit: 2019-12-23 | Discharge: 2019-12-23 | Disposition: A | Discharge status: Home / Self Care | Payer: Medicare Other | Source: Ambulatory Visit | Attending: Internal Medicine | Admitting: Internal Medicine

## 2019-12-23 DIAGNOSIS — U071 COVID-19: Secondary | ICD-10-CM | POA: Insufficient documentation

## 2019-12-23 LAB — SARS CORONAVIRUS 2 (TAT 6-24 HRS): SARS Coronavirus 2: POSITIVE — AB

## 2019-12-24 ENCOUNTER — Telehealth: Payer: Self-pay | Admitting: Unknown Physician Specialty

## 2019-12-24 ENCOUNTER — Telehealth: Payer: Self-pay

## 2019-12-24 ENCOUNTER — Telehealth: Payer: Self-pay | Admitting: Physician Assistant

## 2019-12-24 NOTE — OR Nursing (Signed)
Patient covid test returned positive. Notified Cheral Marker, RN with IP due to patient being positive on 08/17/2019. Patient was re-tested due to patient being outside the 90 day window. Per Dr. Baxter Flattery and the current guidelines patient needs to be cancelled for at least 10 days. Tretha Sciara notified that patient needs to be cancelled and quarantined for 10 days due to positive test.

## 2019-12-24 NOTE — Telephone Encounter (Signed)
Called to discuss with patient about Covid symptoms and the use of bamlanivimab/etesevimab or casirivimab/imdevimab, a monoclonal antibody infusion for those with mild to moderate Covid symptoms and at a high risk of hospitalization.  Pt is qualified for this infusion at the [facility] infusion center due to Age > 51 and HTN. Through chart review she had tested positive in January 2021 and was being screened for an EGD.   Message left to call back  Angelena Form PA-C  [facility]

## 2019-12-24 NOTE — Telephone Encounter (Signed)
Called to discuss with [name] about Covid symptoms and the use of bamlanivimab, a monoclonal antibody infusion for those with mild to moderate Covid symptoms and at a high risk of hospitalization.     Pt does not qualify for infusion therapy as she has asymptomatic infection besides sinus symptoms she had had all her live. Isolation precautions discussed. Advised to contact back for consideration should she develop symptoms. Patient verbalized understanding.      Patient Active Problem List   Diagnosis Date Noted  . Dyspepsia 11/24/2019  . Elevated alkaline phosphatase level 11/24/2019  . Abdominal pain 09/24/2019  . COVID-19 virus infection 08/21/2019  . Seasonal allergies 05/06/2017  . Bloating symptom 03/15/2016  . Annual physical exam 06/22/2015  . Recurrent sinusitis 01/05/2015  . CAD (coronary artery disease), native coronary artery 05/06/2014  . Gastric ulcer 03/23/2014  . Lung nodule seen on imaging study 02/16/2014  . Metabolic syndrome X [id number]  . OA (osteoarthritis) of knee 05/20/2013  . Constipation 02/12/2013  . GERD (gastroesophageal reflux disease) 02/12/2013  . Overweight (BMI 25.0-29.9) 02/12/2013  . COPD (chronic obstructive pulmonary disease) ([place]) 08/25/2011  . Vitamin D deficiency 11/23/2010  . BACK PAIN WITH RADICULOPATHY 08/22/2009  . Prediabetes 05/31/2008  . Hyperlipidemia LDL goal <100 03/02/2008  . NICOTINE ADDICTION 10/30/2007  . Essential hypertension 10/30/2007

## 2019-12-24 NOTE — Telephone Encounter (Signed)
Melanie at [facility] called office, pt's COVID test was positive. EGD for tomorrow cancelled. Threasa Beards spoke to infectious disease doctor since pt was positive 07/2019. Pt will need to quarantine, can reschedule procedure after 10 days, and doesn't need retest within next 90 days.   Called pt, informed her procedure will have to be cancelled for tomorrow. She said someone had already called her about positive COVID test. (see note in chart by Kathrine Haddock NP). EGD rescheduled to 02/05/20 at 2:45pm. Pt aware she will not need to be retested before EGD. Instructions mailed.  Informed endo scheduler procedure rescheduled and no COVID test needed prior to procedure.  FYI to AB.

## 2020-01-15 ENCOUNTER — Other Ambulatory Visit: Payer: Self-pay | Admitting: Gastroenterology

## 2020-01-18 ENCOUNTER — Other Ambulatory Visit: Payer: Self-pay | Admitting: *Deleted

## 2020-01-18 NOTE — Telephone Encounter (Signed)
Received refill request from [facility] for pantoprazole 40mg  1 po BID

## 2020-01-19 NOTE — Telephone Encounter (Signed)
Completed via refill box.

## 2020-01-21 ENCOUNTER — Other Ambulatory Visit (HOSPITAL_COMMUNITY): Payer: Self-pay | Admitting: Family Medicine

## 2020-01-21 DIAGNOSIS — Z1231 Encounter for screening mammogram for malignant neoplasm of breast: Secondary | ICD-10-CM

## 2020-02-01 ENCOUNTER — Other Ambulatory Visit: Payer: Self-pay | Admitting: Family Medicine

## 2020-02-05 ENCOUNTER — Encounter (HOSPITAL_COMMUNITY): Payer: Self-pay | Admitting: Internal Medicine

## 2020-02-05 ENCOUNTER — Other Ambulatory Visit: Payer: Self-pay

## 2020-02-05 ENCOUNTER — Encounter (HOSPITAL_COMMUNITY)
Admission: RE | Disposition: A | Discharge status: Home / Self Care | Payer: Self-pay | Source: Home / Self Care | Attending: Internal Medicine

## 2020-02-05 ENCOUNTER — Ambulatory Visit (HOSPITAL_COMMUNITY)
Admission: RE | Admit: 2020-02-05 | Discharge: 2020-02-05 | Disposition: A | Discharge status: Home / Self Care | Payer: Medicare Other | Attending: Internal Medicine | Admitting: Internal Medicine

## 2020-02-05 DIAGNOSIS — Z7982 Long term (current) use of aspirin: Secondary | ICD-10-CM | POA: Insufficient documentation

## 2020-02-05 DIAGNOSIS — R1013 Epigastric pain: Secondary | ICD-10-CM | POA: Diagnosis not present

## 2020-02-05 DIAGNOSIS — K219 Gastro-esophageal reflux disease without esophagitis: Secondary | ICD-10-CM | POA: Diagnosis not present

## 2020-02-05 DIAGNOSIS — R1314 Dysphagia, pharyngoesophageal phase: Secondary | ICD-10-CM | POA: Diagnosis not present

## 2020-02-05 DIAGNOSIS — R1310 Dysphagia, unspecified: Secondary | ICD-10-CM

## 2020-02-05 DIAGNOSIS — I10 Essential (primary) hypertension: Secondary | ICD-10-CM | POA: Insufficient documentation

## 2020-02-05 DIAGNOSIS — F17210 Nicotine dependence, cigarettes, uncomplicated: Secondary | ICD-10-CM | POA: Insufficient documentation

## 2020-02-05 DIAGNOSIS — R634 Abnormal weight loss: Secondary | ICD-10-CM | POA: Insufficient documentation

## 2020-02-05 DIAGNOSIS — Z79899 Other long term (current) drug therapy: Secondary | ICD-10-CM | POA: Insufficient documentation

## 2020-02-05 DIAGNOSIS — Z6828 Body mass index (BMI) 28.0-28.9, adult: Secondary | ICD-10-CM | POA: Diagnosis not present

## 2020-02-05 DIAGNOSIS — J449 Chronic obstructive pulmonary disease, unspecified: Secondary | ICD-10-CM | POA: Insufficient documentation

## 2020-02-05 HISTORY — PX: ESOPHAGOGASTRODUODENOSCOPY: SHX5428

## 2020-02-05 SURGERY — EGD (ESOPHAGOGASTRODUODENOSCOPY)
Anesthesia: Moderate Sedation

## 2020-02-05 MED ORDER — MEPERIDINE HCL 50 MG/ML IJ SOLN
INTRAMUSCULAR | Status: AC
  Filled 2020-02-05: qty 1

## 2020-02-05 MED ORDER — STERILE WATER FOR IRRIGATION IR SOLN
Status: DC | PRN
  Administered 2020-02-05: 1.5 mL

## 2020-02-05 MED ORDER — MIDAZOLAM HCL 5 MG/5ML IJ SOLN
INTRAMUSCULAR | Status: AC
  Filled 2020-02-05: qty 10

## 2020-02-05 MED ORDER — SODIUM CHLORIDE 0.9 % IV SOLN
INTRAVENOUS | Status: DC
  Administered 2020-02-05: 1000 mL via INTRAVENOUS

## 2020-02-05 MED ORDER — ONDANSETRON HCL 4 MG/2ML IJ SOLN
INTRAMUSCULAR | Status: AC
  Filled 2020-02-05: qty 2

## 2020-02-05 MED ORDER — MEPERIDINE HCL 100 MG/ML IJ SOLN
INTRAMUSCULAR | Status: DC | PRN
  Administered 2020-02-05: 15 mg via INTRAVENOUS
  Administered 2020-02-05: 25 mg via INTRAVENOUS

## 2020-02-05 MED ORDER — LIDOCAINE VISCOUS HCL 2 % MT SOLN
OROMUCOSAL | Status: AC
  Filled 2020-02-05: qty 15

## 2020-02-05 MED ORDER — MIDAZOLAM HCL 5 MG/5ML IJ SOLN
INTRAMUSCULAR | Status: DC | PRN
  Administered 2020-02-05 (×2): 1 mg via INTRAVENOUS
  Administered 2020-02-05: 2 mg via INTRAVENOUS
  Administered 2020-02-05: 1 mg via INTRAVENOUS

## 2020-02-05 MED ORDER — ONDANSETRON HCL 4 MG/2ML IJ SOLN
INTRAMUSCULAR | Status: DC | PRN
  Administered 2020-02-05: 4 mg via INTRAVENOUS

## 2020-02-05 NOTE — Discharge Instructions (Signed)
EGD Discharge instructions Please read the instructions outlined below and refer to this sheet in the next few weeks. These discharge instructions provide you with general information on caring for yourself after you leave the hospital. Your doctor may also give you specific instructions. While your treatment has been planned according to the most current medical practices available, unavoidable complications occasionally occur. If you have any problems or questions after discharge, please call your doctor. ACTIVITY  You may resume your regular activity but move at a slower pace for the next 24 hours.   Take frequent rest periods for the next 24 hours.   Walking will help expel (get rid of) the air and reduce the bloated feeling in your abdomen.   No driving for 24 hours (because of the anesthesia (medicine) used during the test).   You may shower.   Do not sign any important legal documents or operate any machinery for 24 hours (because of the anesthesia used during the test).  NUTRITION  Drink plenty of fluids.   You may resume your normal diet.   Begin with a light meal and progress to your normal diet.   Avoid alcoholic beverages for 24 hours or as instructed by your caregiver.  MEDICATIONS  You may resume your normal medications unless your caregiver tells you otherwise.  WHAT YOU CAN EXPECT TODAY  You may experience abdominal discomfort such as a feeling of fullness or "gas" pains.  FOLLOW-UP  Your doctor will discuss the results of your test with you.  SEEK IMMEDIATE MEDICAL ATTENTION IF ANY OF THE FOLLOWING OCCUR:  Excessive nausea (feeling sick to your stomach) and/or vomiting.   Severe abdominal pain and distention (swelling).   Trouble swallowing.   Temperature over 101 F (37.8 C).   Rectal bleeding or vomiting of blood.    I stretched your esophagus today.  Stop Protonix; begin Nexium 40 mg daily before breakfast  Office visit with [place] in 6 weeks

## 2020-02-05 NOTE — H&P (Signed)
@LOGO @   Primary Care Physician:  Fayrene Helper, MD Primary Gastroenterologist:  Dr. Gala Romney  Pre-Procedure History & Physical: HPI:  [name] is a 79 y.o. female here for further evaluation of vague dyspepsia and esophageal dysphagia.  Reflux well controlled on Protonix 40 mg twice daily.  Weight loss has been documented.  She does not have a fear of eating.  Symptoms not clearly exclusively postprandial.  Past Medical History:  Diagnosis Date  . COPD (chronic obstructive pulmonary disease) ([place])   . Hiatal hernia without gangrene and obstruction 03/23/2015  . Hypertension 2004  . Lung nodule seen on imaging study   . Nicotine addiction   . OA (osteoarthritis) of knee   . Obesity   . Sinusitis     Past Surgical History:  Procedure Laterality Date  . bilateral cataract surgery  7/27 & 03/01/09   Dr. Gershon Crane    . cholecystectomy    . COLONOSCOPY  04/2004   Dr. Leane Para Smith-->melanosis coli  . COLONOSCOPY WITH ESOPHAGOGASTRODUODENOSCOPY (EGD) N/A 02/25/2013   Dr. Fields:Normal mucosa in the terminal ileum/Severe melanosis throughout the entire examined colon/ONE COLON POLYP REMOVED (tubular adenoma)/Mild diverticulosis  in the sigmoid colon/EGD:The mucosa of the esophagus appeared normal/Non-erosive gastritis, empiric dilation with Savary dilator  . ESOPHAGOGASTRODUODENOSCOPY  11/07/2010   Jenkins:hiatal hernia/no evidence of Barrett esophagitis.  The Z-line was noted to be at 39 cm from the teeth. CLO test negative  . ESOPHAGOGASTRODUODENOSCOPY N/A 09/11/2013   Dr.Fields-  incomplete esophageal web in mid-esophagus, medium sized hialtal hernia, PUD. bx=focal erosion with inflammation and fibrosis  . ESOPHAGOGASTRODUODENOSCOPY N/A 07/06/2014   healed ulcers, persistent erosions on aspirin, small hiatal hernia  . knee arthroscopy right    . LEFT HEART CATHETERIZATION WITH CORONARY ANGIOGRAM N/A 04/08/2014   Procedure: LEFT HEART CATHETERIZATION WITH CORONARY ANGIOGRAM;  Surgeon:  Sinclair Grooms, MD;  Location: [facility] CATH LAB;  Service: Cardiovascular;  Laterality: N/A;  . MALONEY DILATION N/A 02/25/2013   Procedure: Venia Minks DILATION;  Surgeon: Danie Binder, MD;  Location: [facility] ENDO SUITE;  Service: Endoscopy;  Laterality: N/A;  . PARTIAL HYSTERECTOMY    . SAVORY DILATION N/A 02/25/2013   Procedure: SAVORY DILATION;  Surgeon: Danie Binder, MD;  Location: [facility] ENDO SUITE;  Service: Endoscopy;  Laterality: N/A;  . total knee arthroplasty right  05/29/2005   Dr. Aline Brochure     Prior to Admission medications   Medication Sig Start Date End Date Taking? Authorizing Provider  amLODipine (NORVASC) 10 MG tablet TAKE 1 TABLET(10 MG) BY MOUTH DAILY 02/01/20  Yes Fayrene Helper, MD  aspirin EC 81 MG tablet Take 81 mg by mouth daily.   Yes [provider]  fluticasone (FLONASE) 50 MCG/ACT nasal spray Place 2 sprays into both nostrils daily as needed for allergies or rhinitis. 10/10/18  Yes Fayrene Helper, MD  Multiple Vitamin (MULTIVITAMIN) tablet Take 2 tablets by mouth daily.    Yes [provider]  Omega 3 1000 MG CAPS Take 1 capsule by mouth daily.   Yes [provider]  pantoprazole (PROTONIX) 40 MG tablet TAKE 1 TABLET(40 MG) BY MOUTH TWICE DAILY BEFORE A MEAL 01/19/20  Yes Annitta Needs, NP  triamterene-hydrochlorothiazide (MAXZIDE-25) 37.5-25 MG tablet TAKE 1 TABLET BY MOUTH DAILY 12/24/19  Yes Fayrene Helper, MD  albuterol (VENTOLIN HFA) 108 (90 Base) MCG/ACT inhaler Inhale 2 puffs into the lungs every 6 (six) hours as needed for wheezing or shortness of breath. Patient not taking: Reported  on 12/15/2019 12/07/19   Fayrene Helper, MD  ALOE VERA PO Take 500 mg by mouth daily as needed (stomach).     [provider]  atorvastatin (LIPITOR) 20 MG tablet Take 1 tablet (20 mg total) by mouth daily. Patient not taking: Reported on 12/15/2019 05/06/19   Fayrene Helper, MD  montelukast (SINGULAIR) 10 MG tablet Take 1 tablet (10 mg  total) by mouth at bedtime. Patient not taking: Reported on 12/15/2019 06/15/19   Fayrene Helper, MD    Allergies as of 11/24/2019  . (No Known Allergies)    Family History  Problem Relation Age of Onset  . Diabetes Mother   . Hypertension Mother   . Coronary artery disease Mother   . Pneumonia Father   . Hypertension Sister   . Hypertension Sister   . Hypertension Brother        Psychologist, forensic  . Diabetes Brother   . Hypertension Brother   . Colon cancer Neg Hx   . Liver disease Neg Hx     Social History   Socioeconomic History  . Marital status: Widowed    Spouse name: Not on file  . Number of children: 4  . Years of education: Not on file  . Highest education level: Not on file  Occupational History  . Occupation: employed     Employer: RETIRED  Tobacco Use  . Smoking status: Current Some Day Smoker    Packs/day: 0.10    Years: 10.00    Pack years: 1.00    Types: Cigarettes    Start date: 07/23/1962  . Smokeless tobacco: Never Used  . Tobacco comment: 1 per day   Vaping Use  . Vaping Use: Never used  Substance and Sexual Activity  . Alcohol use: No    Alcohol/week: 0.0 standard drinks  . Drug use: No  . Sexual activity: Never  Other Topics Concern  . Not on file  Social History Narrative  . Not on file   Social Determinants of Health   Financial Resource Strain:   . Difficulty of Paying Living Expenses:   Food Insecurity:   . Worried About Charity fundraiser in the Last Year:   . Arboriculturist in the Last Year:   Transportation Needs:   . Film/video editor (Medical):   Marland Kitchen Lack of Transportation (Non-Medical):   Physical Activity:   . Days of Exercise per Week:   . Minutes of Exercise per Session:   Stress:   . Feeling of Stress :   Social Connections:   . Frequency of Communication with Friends and Family:   . Frequency of Social Gatherings with Friends and Family:   . Attends Religious Services:   . Active Member of Clubs or  Organizations:   . Attends Archivist Meetings:   Marland Kitchen Marital Status:   Intimate Partner Violence:   . Fear of Current or Ex-Partner:   . Emotionally Abused:   Marland Kitchen Physically Abused:   . Sexually Abused:     Review of Systems: See HPI, otherwise negative ROS  Physical Exam: BP (!) 191/85   Pulse (!) 105   Temp 98.5 F (36.9 C) (Oral)   Resp 18   Ht 5\' 7"  (1.702 m)   Wt 82.6 kg   SpO2 96%   BMI 28.51 kg/m  General:   Alert,  Well-developed, well-nourished, pleasant and cooperative in NAD Neck:  Supple; no masses or thyromegaly. No significant cervical adenopathy. Lungs:  Clear throughout to auscultation.   No wheezes, crackles, or rhonchi. No acute distress. Heart:  Regular rate and rhythm; no murmurs, clicks, rubs,  or gallops. Abdomen: Non-distended, normal bowel sounds.  Soft and nontender without appreciable mass or hepatosplenomegaly.  Pulses:  Normal pulses noted. Extremities:  Without clubbing or edema.  Impression/Plan: 79 year old lady with dyspeptic symptoms vague esophageal dysphagia (history of esophageal web).  Weight loss documented.  I have offered the patient a diagnostic EGD with possible esophageal dilation as feasible/appropriate per plan.  The risks, benefits, limitations, alternatives and imponderables have been reviewed with the patient. Potential for esophageal dilation, biopsy, etc. have also been reviewed.  Questions have been answered. All parties agreeable.     Notice: This dictation was prepared with Dragon dictation along with smaller phrase technology. Any transcriptional errors that result from this process are unintentional and may not be corrected upon review.

## 2020-02-05 NOTE — Op Note (Addendum)
[facility] Patient Name: [name] Procedure Date: 02/05/2020 10:51 AM MRN: [id number] Date of Birth: February 04, 1941 Attending MD: Norvel Richards , MD CSN: [id number] Age: 79 Admit Type: Outpatient Procedure:                Upper GI endoscopy Indications:              Dyspepsia, Dysphagia, Weight loss Providers:                Norvel Richards, MD, Otis Peak B. Sharon Seller, RN,                            Nelma Rothman, Technician Referring MD:              Medicines:                Midazolam 5 mg IV, Meperidine 40 mg IV Complications:            No immediate complications. Estimated Blood Loss:     Estimated blood loss: none. Procedure:                Pre-Anesthesia Assessment:                           - Prior to the procedure, a History and Physical                            was performed, and patient medications and                            allergies were reviewed. The patient's tolerance of                            previous anesthesia was also reviewed. The risks                            and benefits of the procedure and the sedation                            options and risks were discussed with the patient.                            All questions were answered, and informed consent                            was obtained. Prior Anticoagulants: The patient has                            taken no previous anticoagulant or antiplatelet                            agents. ASA Grade Assessment: II - A patient with                            mild systemic disease. After reviewing the risks  and benefits, the patient was deemed in                            satisfactory condition to undergo the procedure.                           After obtaining informed consent, the endoscope was                            passed under direct vision. Throughout the                            procedure, the patient's blood pressure, pulse, and                             oxygen saturations were monitored continuously. The                            GIF-H190 ([id number]) was introduced through the                            mouth, and advanced to the second part of duodenum.                            The upper GI endoscopy was accomplished without                            difficulty. The patient tolerated the procedure                            well. Scope In: 11:15:55 AM Scope Out: 11:22:55 AM Total Procedure Duration: 0 hours 7 minutes 0 seconds  Findings:      The examined esophagus was normal.      The stomach was normal.      The duodenal bulb and second portion of the duodenum were normal. The       scope was withdrawn. Dilation was performed with a Maloney dilator with       mild resistance at 76 Fr. The dilation site was examined following       endoscope reinsertion and showed no change. Estimated blood loss: none. Impression:               - Normal esophagus. Dilated.                           - Normal stomach.                           - Normal duodenal bulb and second portion of the                            duodenum.                           - No specimens collected. Moderate Sedation:      Moderate (conscious) sedation was administered by the endoscopy nurse  and supervised by the endoscopist. The following parameters were       monitored: oxygen saturation, heart rate, blood pressure, respiratory       rate, EKG, adequacy of pulmonary ventilation, and response to care. Recommendation:           - Patient has a contact number available for                            emergencies. The signs and symptoms of potential                            delayed complications were discussed with the                            patient. Return to normal activities tomorrow.                            Written discharge instructions were provided to the                            patient.                           - Resume previous diet.                            - Continue present medications. Stop Protonix;                            trial of Nexium 40 mg once daily. Office visit with                            [place] in 6 weeks. If symptoms persisting, need to                            consider evaluation of patient's mesenteric                            vasculature. Procedure Code(s):        --- Professional ---                           [phone], Esophagogastroduodenoscopy, flexible,                            transoral; diagnostic, including collection of                            specimen(s) by brushing or washing, when performed                            (separate procedure)                           66063, Dilation of esophagus, by unguided sound or  bougie, single or multiple passes Diagnosis Code(s):        --- Professional ---                           R10.13, Epigastric pain                           R13.10, Dysphagia, unspecified                           R63.4, Abnormal weight loss CPT copyright 2019 American Medical Association. All rights reserved. The codes documented in this report are preliminary and upon coder review may  be revised to meet current compliance requirements. Cristopher Estimable. Zeyna Mkrtchyan, MD Norvel Richards, MD 02/05/2020 11:28:58 AM This report has been signed electronically. Number of Addenda: 0

## 2020-02-10 ENCOUNTER — Encounter (HOSPITAL_COMMUNITY): Payer: Self-pay | Admitting: Internal Medicine

## 2020-02-11 ENCOUNTER — Other Ambulatory Visit: Payer: Self-pay

## 2020-02-11 ENCOUNTER — Encounter: Payer: Self-pay | Admitting: Family Medicine

## 2020-02-11 ENCOUNTER — Ambulatory Visit (INDEPENDENT_AMBULATORY_CARE_PROVIDER_SITE_OTHER): Payer: Medicare Other | Admitting: Family Medicine

## 2020-02-11 VITALS — BP 172/82 | HR 95 | Temp 98.0°F | Resp 18 | Ht 67.0 in | Wt 182.0 lb

## 2020-02-11 DIAGNOSIS — J0100 Acute maxillary sinusitis, unspecified: Secondary | ICD-10-CM

## 2020-02-11 DIAGNOSIS — F17200 Nicotine dependence, unspecified, uncomplicated: Secondary | ICD-10-CM

## 2020-02-11 DIAGNOSIS — E785 Hyperlipidemia, unspecified: Secondary | ICD-10-CM | POA: Diagnosis not present

## 2020-02-11 DIAGNOSIS — I10 Essential (primary) hypertension: Secondary | ICD-10-CM | POA: Diagnosis not present

## 2020-02-11 DIAGNOSIS — E663 Overweight: Secondary | ICD-10-CM

## 2020-02-11 DIAGNOSIS — Z1159 Encounter for screening for other viral diseases: Secondary | ICD-10-CM

## 2020-02-11 DIAGNOSIS — F17210 Nicotine dependence, cigarettes, uncomplicated: Secondary | ICD-10-CM | POA: Diagnosis not present

## 2020-02-11 DIAGNOSIS — E559 Vitamin D deficiency, unspecified: Secondary | ICD-10-CM

## 2020-02-11 DIAGNOSIS — J302 Other seasonal allergic rhinitis: Secondary | ICD-10-CM

## 2020-02-11 MED ORDER — NICOTINE 7 MG/24HR TD PT24
7.0000 mg | MEDICATED_PATCH | Freq: Every day | TRANSDERMAL | 1 refills | Status: DC
Start: 2020-02-11 — End: 2020-04-14

## 2020-02-11 MED ORDER — SULFAMETHOXAZOLE-TRIMETHOPRIM 800-160 MG PO TABS
1.0000 | ORAL_TABLET | Freq: Two times a day (BID) | ORAL | 0 refills | Status: DC
Start: 2020-02-11 — End: 2020-04-14

## 2020-02-11 MED ORDER — TRIAMTERENE-HCTZ 37.5-25 MG PO TABS
ORAL_TABLET | ORAL | 3 refills | Status: DC
Start: 2020-02-11 — End: 2020-05-25

## 2020-02-11 NOTE — Patient Instructions (Addendum)
Follow-up in office to reevaluate blood pressure in 2 months call if you need me sooner.  Follow-up in office to reevaluate cigarette smoking in 2 months.  New for blood pressure is triamterene 1-1/2 tablets once daily this is an increase from 1 daily.  Continue amlodipine as before.  Now smoking 5 cigarettes/day and ready to quit.  Please fill the NicoDerm patches prescribed and apply 1 patch every day.  As soon as you use the patch stop using any cigarettes.  For your sinus infection Septra and antibiotic is prescribed 1 twice daily for the next 10 days.  Please get non fasting chem 7 and eGFr, tSH , cBC and vit D 5 to 7  days before next visit  Thanks for choosing [facility], we consider it a privelige to serve you.

## 2020-02-15 ENCOUNTER — Encounter: Payer: Self-pay | Admitting: Family Medicine

## 2020-02-15 NOTE — Assessment & Plan Note (Signed)
  Patient re-educated about  the importance of commitment to a  minimum of 150 minutes of exercise per week as able.  The importance of healthy food choices with portion control discussed, as well as eating regularly and within a 12 hour window most days. The need to choose "clean , green" food 50 to 75% of the time is discussed, as well as to make water the primary drink and set a goal of 64 ounces water daily.    Weight /BMI 02/11/2020 02/05/2020 11/24/2019  WEIGHT 182 lb 182 lb 184 lb 3.2 oz  HEIGHT 5\' 7"  5\' 7"  5\' 7"   BMI 28.51 kg/m2 28.51 kg/m2 28.85 kg/m2

## 2020-02-15 NOTE — Assessment & Plan Note (Signed)
Currently uncontrolled, pt to use prescription med daily

## 2020-02-15 NOTE — Progress Notes (Signed)
   [name]     MRN: [id number]      DOB: August 06, 1940   HPI [name] is here for follow up and re-evaluation of chronic medical conditions, medication management and review of any available recent lab and radiology data.  Preventive health is updated, specifically  Cancer screening and Immunization.   3 monht h/o recurrent sinus pressure and drainage, which is thick and cream, also having throat discomfort, denies ear pain and has no chest congestion The PT denies any adverse reactions to current medications since the last visit.    ROS  Denies chest pains, palpitations and leg swelling Denies abdominal pain, nausea, vomiting,diarrhea or constipation.   Denies dysuria, frequency, hesitancy or incontinence. Denies joint pain, swelling and limitation in mobility. Denies headaches, seizures, numbness, or tingling. Denies depression, anxiety or insomnia. Denies skin break down or rash.   PE  BP (!) 172/82 (BP Location: Right Arm, Patient Position: Sitting, Cuff Size: Normal)   Pulse 95   Temp 98 F (36.7 C) (Temporal)   Resp 18   Ht 5\' 7"  (1.702 m)   Wt 182 lb (82.6 kg)   SpO2 95%   BMI 28.51 kg/m   Patient alert and oriented and in no cardiopulmonary distress.  HEENT: No facial asymmetry, EOMI,     Neck supple . Maxillary sinus tenderness Chest: Clear to auscultation bilaterally.No crackles or wheezes  CVS: S1, S2 no murmurs, no S3.Regular rate.  ABD: Soft non tender.   Ext: No edema  MS: Decreased  ROM spine, shoulders, hips and knees.  Skin: Intact, no ulcerations or rash noted.  Psych: Good eye contact, normal affect. Memory intact not anxious or depressed appearing.  CNS: CN 2-12 intact, power,  normal throughout.no focal deficits noted.   Assessment & Plan  NICOTINE ADDICTION Asked:confirms currently smokes cigarettes, now at 5/ day Assess:Wants  to quit and  cutting back Advise: needs to QUIT to reduce risk of cancer, cardio and cerebrovascular  disease Assist: counseled for 5 minutes and literature provided Arrange: follow up in 3 months Start nicoderm patch on quit date   Essential hypertension Uncontrolled, increase dose of triamterene DASH diet and commitment to daily physical activity for a minimum of 30 minutes discussed and encouraged, as a part of hypertension management. The importance of attaining a healthy weight is also discussed.  BP/Weight 02/11/2020 02/05/2020 11/24/2019 09/24/2019 08/17/2019 07/21/2019 33/00/7622  Systolic BP 633 354 562 563 893 734 287  Diastolic BP 82 67 98 72 88 78 80  Wt. (Lbs) 182 182 184.2 183 - - 185  BMI 28.51 28.51 28.85 28.66 - - 28.98     F/u in 8 weeks  Overweight (BMI 25.0-29.9)  Patient re-educated about  the importance of commitment to a  minimum of 150 minutes of exercise per week as able.  The importance of healthy food choices with portion control discussed, as well as eating regularly and within a 12 hour window most days. The need to choose "clean , green" food 50 to 75% of the time is discussed, as well as to make water the primary drink and set a goal of 64 ounces water daily.    Weight /BMI 02/11/2020 02/05/2020 11/24/2019  WEIGHT 182 lb 182 lb 184 lb 3.2 oz  HEIGHT 5\' 7"  5\' 7"  5\' 7"   BMI 28.51 kg/m2 28.51 kg/m2 28.85 kg/m2      Acute sinusitis Septra prescribed  Seasonal allergies Currently uncontrolled, pt to use prescription med daily  '

## 2020-02-15 NOTE — Assessment & Plan Note (Signed)
Septra prescribed 

## 2020-02-15 NOTE — Assessment & Plan Note (Signed)
Uncontrolled, increase dose of triamterene DASH diet and commitment to daily physical activity for a minimum of 30 minutes discussed and encouraged, as a part of hypertension management. The importance of attaining a healthy weight is also discussed.  BP/Weight 02/11/2020 02/05/2020 11/24/2019 09/24/2019 08/17/2019 07/21/2019 52/02/222  Systolic BP 361 224 497 530 051 102 111  Diastolic BP 82 67 98 72 88 78 80  Wt. (Lbs) 182 182 184.2 183 - - 185  BMI 28.51 28.51 28.85 28.66 - - 28.98     F/u in 8 weeks

## 2020-02-15 NOTE — Assessment & Plan Note (Signed)
Asked:confirms currently smokes cigarettes, now at 5/ day Assess:Wants  to quit and  cutting back Advise: needs to QUIT to reduce risk of cancer, cardio and cerebrovascular disease Assist: counseled for 5 minutes and literature provided Arrange: follow up in 3 months Start nicoderm patch on quit date

## 2020-02-24 ENCOUNTER — Ambulatory Visit: Payer: Medicare Other | Admitting: Family Medicine

## 2020-02-29 ENCOUNTER — Ambulatory Visit (HOSPITAL_COMMUNITY)
Admission: RE | Admit: 2020-02-29 | Discharge: 2020-02-29 | Disposition: A | Discharge status: Home / Self Care | Payer: Medicare Other | Source: Ambulatory Visit | Attending: Family Medicine | Admitting: Family Medicine

## 2020-02-29 ENCOUNTER — Other Ambulatory Visit: Payer: Self-pay

## 2020-02-29 DIAGNOSIS — Z1231 Encounter for screening mammogram for malignant neoplasm of breast: Secondary | ICD-10-CM

## 2020-03-01 ENCOUNTER — Encounter: Payer: Self-pay | Admitting: Family Medicine

## 2020-03-01 ENCOUNTER — Telehealth: Payer: Medicare Other | Admitting: Family Medicine

## 2020-03-01 ENCOUNTER — Encounter (HOSPITAL_COMMUNITY): Payer: Self-pay

## 2020-03-01 ENCOUNTER — Telehealth (INDEPENDENT_AMBULATORY_CARE_PROVIDER_SITE_OTHER): Payer: Medicare Other | Admitting: Family Medicine

## 2020-03-01 VITALS — BP 172/82 | Ht 67.0 in | Wt 182.0 lb

## 2020-03-01 DIAGNOSIS — Z0000 Encounter for general adult medical examination without abnormal findings: Secondary | ICD-10-CM

## 2020-03-01 DIAGNOSIS — Z122 Encounter for screening for malignant neoplasm of respiratory organs: Secondary | ICD-10-CM | POA: Diagnosis not present

## 2020-03-01 NOTE — Progress Notes (Signed)
Referral received for Lung Cancer Screening Program. Given the patient's age, patient is no longer eligible for the screening program. Referring provider made aware. I will close this referral at this time.

## 2020-03-01 NOTE — Progress Notes (Signed)
Subjective:   [name] is a 79 y.o. female who presents for Medicare Annual (Subsequent) preventive examination.  Location of Patient: Home Location of Provider: Telephone Consent was obtain for visit to be over via telehealth.  I verified that I am speaking with the correct person using two identifiers.   Review of Systems    yes       Objective:    There were no vitals filed for this visit. There is no height or weight on file to calculate BMI.  Advanced Directives 02/05/2020 02/24/2018 12/03/2016 02/20/2016 07/06/2014 04/08/2014 09/11/2013  Does Patient Have a Medical Advance Directive? Yes No No No No No Patient does not have advance directive;Patient would not like information  Type of Advance Directive Living will - - - - - -  Does patient want to make changes to medical advance directive? - Yes (ED - Information included in AVS) - - - - -  Copy of Healthcare Power of Attorney in Chart? - - - - - - -  Would patient like information on creating a medical advance directive? - - Yes (MAU/Ambulatory/Procedural Areas - Information given) Yes - Educational materials given No - patient declined information No - patient declined information -  Pre-existing out of facility DNR order (yellow form or pink MOST form) - - - - - - No    Current Medications (verified) Outpatient Encounter Medications as of 03/01/2020  Medication Sig   ALOE VERA PO Take 500 mg by mouth daily as needed (stomach).    amLODipine (NORVASC) 10 MG tablet TAKE 1 TABLET(10 MG) BY MOUTH DAILY   aspirin EC 81 MG tablet Take 81 mg by mouth daily.   esomeprazole (NEXIUM) 40 MG capsule Take 40 mg by mouth daily.   fluticasone (FLONASE) 50 MCG/ACT nasal spray Place 2 sprays into both nostrils daily as needed for allergies or rhinitis.   Multiple Vitamin (MULTIVITAMIN) tablet Take 2 tablets by mouth daily.    nicotine (NICODERM CQ) 7 mg/24hr patch Place 1 patch (7 mg total) onto the skin daily.   Omega 3  1000 MG CAPS Take 1 capsule by mouth daily.   sulfamethoxazole-trimethoprim (BACTRIM DS) 800-160 MG tablet Take 1 tablet by mouth 2 (two) times daily.   triamterene-hydrochlorothiazide (MAXZIDE-25) 37.5-25 MG tablet Take one and a half tablets once daily by mouth  For blood pressure   No facility-administered encounter medications on file as of 03/01/2020.    Allergies (verified) Patient has no known allergies.   History: Past Medical History:  Diagnosis Date   COPD (chronic obstructive pulmonary disease) ([place])    Hiatal hernia without gangrene and obstruction 03/23/2015   Hypertension 2004   Lung nodule seen on imaging study    Nicotine addiction    OA (osteoarthritis) of knee    Obesity    Sinusitis    Past Surgical History:  Procedure Laterality Date   bilateral cataract surgery  7/27 & 03/01/09   Dr. Gershon Crane     cholecystectomy     COLONOSCOPY  04/2004   Dr. Leane Para Smith-->melanosis coli   COLONOSCOPY WITH ESOPHAGOGASTRODUODENOSCOPY (EGD) N/A 02/25/2013   Dr. Fields:Normal mucosa in the terminal ileum/Severe melanosis throughout the entire examined colon/ONE COLON POLYP REMOVED (tubular adenoma)/Mild diverticulosis  in the sigmoid colon/EGD:The mucosa of the esophagus appeared normal/Non-erosive gastritis, empiric dilation with Savary dilator   ESOPHAGOGASTRODUODENOSCOPY  11/07/2010   Jenkins:hiatal hernia/no evidence of Barrett esophagitis.  The Z-line was noted to be at 39 cm from the  teeth. CLO test negative   ESOPHAGOGASTRODUODENOSCOPY N/A 09/11/2013   Dr.Fields-  incomplete esophageal web in mid-esophagus, medium sized hialtal hernia, PUD. bx=focal erosion with inflammation and fibrosis   ESOPHAGOGASTRODUODENOSCOPY N/A 07/06/2014   healed ulcers, persistent erosions on aspirin, small hiatal hernia   ESOPHAGOGASTRODUODENOSCOPY N/A 02/05/2020   Procedure: ESOPHAGOGASTRODUODENOSCOPY (EGD);  Surgeon: Daneil Dolin, MD;  Location: [facility];  Service:  Endoscopy;  Laterality: N/A;  7:30AM, pt does not need covid test <90 days   knee arthroscopy right     LEFT HEART CATHETERIZATION WITH CORONARY ANGIOGRAM N/A 04/08/2014   Procedure: LEFT HEART CATHETERIZATION WITH CORONARY ANGIOGRAM;  Surgeon: Sinclair Grooms, MD;  Location: [facility] CATH LAB;  Service: Cardiovascular;  Laterality: N/A;   MALONEY DILATION N/A 02/25/2013   Procedure: Venia Minks DILATION;  Surgeon: Danie Binder, MD;  Location: [facility];  Service: Endoscopy;  Laterality: N/A;   PARTIAL HYSTERECTOMY     SAVORY DILATION N/A 02/25/2013   Procedure: SAVORY DILATION;  Surgeon: Danie Binder, MD;  Location: [facility];  Service: Endoscopy;  Laterality: N/A;   total knee arthroplasty right  05/29/2005   Dr. Aline Brochure    Family History  Problem Relation Age of Onset   Diabetes Mother    Hypertension Mother    Coronary artery disease Mother    Pneumonia Father    Hypertension Sister    Hypertension Sister    Hypertension Brother        Psychologist, forensic   Diabetes Brother    Hypertension Brother    Colon cancer Neg Hx    Liver disease Neg Hx    Social History   Socioeconomic History   Marital status: Widowed    Spouse name: Not on file   Number of children: 4   Years of education: Not on file   Highest education level: Not on file  Occupational History   Occupation: employed     Employer: RETIRED  Tobacco Use   Smoking status: Current Some Day Smoker    Packs/day: 0.10    Years: 10.00    Pack years: 1.00    Types: Cigarettes    Start date: 07/23/1962   Smokeless tobacco: Never Used   Tobacco comment: 1 per day   Vaping Use   Vaping Use: Never used  Substance and Sexual Activity   Alcohol use: No    Alcohol/week: 0.0 standard drinks   Drug use: No   Sexual activity: Never  Other Topics Concern   Not on file  Social History Narrative   Not on file   Social Determinants of Health   Financial Resource Strain:    Difficulty of Paying  Living Expenses:   Food Insecurity:    Worried About Charity fundraiser in the Last Year:    Arboriculturist in the Last Year:   Transportation Needs:    Film/video editor (Medical):    Lack of Transportation (Non-Medical):   Physical Activity:    Days of Exercise per Week:    Minutes of Exercise per Session:   Stress:    Feeling of Stress :   Social Connections:    Frequency of Communication with Friends and Family:    Frequency of Social Gatherings with Friends and Family:    Attends Religious Services:    Active Member of Clubs or Organizations:    Attends Archivist Meetings:    Marital Status:     Tobacco Counseling Ready to  quit: Not Answered Counseling given: Not Answered Comment: 1 per day    Clinical Intake:                 Diabetic?no         Activities of Daily Living No flowsheet data found.  Patient Care Team: Fayrene Helper, MD as PCP - General Herminio Commons, MD (Inactive) as Attending Physician (Cardiology) Danie Binder, MD (Inactive) as Consulting Physician (Gastroenterology)  Indicate any recent Medical Services you may have received from other than [facility] providers in the past year (date may be approximate).     Assessment:   This is a routine wellness examination for [facility].  Hearing/Vision screen No exam data present  Dietary issues and exercise activities discussed:    Goals     Increase physical activity     Try walking at least 3 days per week for 20-30 mins at a time      Quit smoking / using tobacco      Depression Screen PHQ 2/9 Scores 02/11/2020 08/20/2019 06/02/2019 05/20/2019 02/26/2019 11/28/2018 08/13/2018  PHQ - 2 Score 0 0 0 0 0 0 0  PHQ- 9 Score - - - - - - -    Fall Risk Fall Risk  02/11/2020 09/24/2019 08/20/2019 06/15/2019 06/02/2019  Falls in the past year? 0 0 0 0 0  Number falls in past yr: 0 0 0 0 0  Injury with Fall? 0 0 0 0 0  Risk for fall due to : No Fall  Risks - - - -  Follow up Falls evaluation completed - - - -    Any stairs in or around the home? No  If so, are there any without handrails? No  Home free of loose throw rugs in walkways, pet beds, electrical cords, etc? Yes  Adequate lighting in your home to reduce risk of falls? Yes   ASSISTIVE DEVICES UTILIZED TO PREVENT FALLS:  Life alert? No  Use of a cane, walker or w/c? No  Grab bars in the bathroom? Yes  Shower chair or bench in shower? Yes  Elevated toilet seat or a handicapped toilet? Yes   TIMED UP AND GO:  Was the test performed? No .  Length of time to ambulate 10 feet: NA sec.     Cognitive Function:     6CIT Screen 02/26/2019 02/24/2018 12/03/2016  What Year? 0 points 0 points 0 points  What month? 0 points 0 points 0 points  What time? 0 points 0 points 0 points  Count back from 20 0 points 2 points 0 points  Months in reverse 2 points 2 points 0 points  Repeat phrase 0 points 2 points 0 points  Total Score 2 6 0    Immunizations Immunization History  Administered Date(s) Administered   Fluad Quad(high Dose 65+) 04/27/2019   H1N1 06/21/2008   Influenza Split 05/20/2012   Influenza Whole 05/07/2007, 04/19/2008, 05/09/2010, 03/30/2011   Influenza, High Dose Seasonal PF 06/04/2018   Influenza,inj,Quad PF,6+ Mos 04/29/2013, 06/15/2014, 05/24/2015, 06/11/2016, 05/06/2017   PFIZER SARS-COV-2 Vaccination 10/15/2019, 11/07/2019   PPD Test 09/01/2018, 09/01/2018   Pneumococcal Conjugate-13 02/16/2014   Pneumococcal Polysaccharide-23 04/19/2008   Td 07/24/1995, 03/17/2009    TDAP status: Up to date Flu Vaccine status: Up to date Pneumococcal vaccine status: Up to date Covid-19 vaccine status: Completed vaccines  Qualifies for Shingles Vaccine? Yes   Zostavax completed No   Shingrix Completed?: No.    Education has been provided  regarding the importance of this vaccine. Patient has been advised to call insurance company to determine out of pocket  expense if they have not yet received this vaccine. Advised may also receive vaccine at local pharmacy or Health Dept. Verbalized acceptance and understanding.  Screening Tests Health Maintenance  Topic Date Due   Hepatitis C Screening  Never done   INFLUENZA VACCINE  02/21/2020   TETANUS/TDAP  04/26/2020 (Originally 03/18/2019)   DEXA SCAN  Completed   COVID-19 Vaccine  Completed   PNA vac Low Risk Adult  Completed    Health Maintenance  Health Maintenance Due  Topic Date Due   Hepatitis C Screening  Never done   INFLUENZA VACCINE  02/21/2020    Colorectal cancer screening: Completed 02-25-13. Repeat every 10 years Mammogram status: Completed 02-29-20. Repeat every year Bone Density status: Completed 03-05-18. Results reflect: Bone density results: NORMAL. Repeat every 5 years.  Lung Cancer Screening: (Low Dose CT Chest recommended if Age 35-80 years, 30 pack-year currently smoking OR have quit w/in 15years.) does qualify.   Lung Cancer Screening Referral: yes  Additional Screening:  Hepatitis C Screening: does not qualify;   Vision Screening: Recommended annual ophthalmology exams for early detection of glaucoma and other disorders of the eye. Is the patient up to date with their annual eye exam?  No  Who is the provider or what is the name of the office in which the patient attends annual eye exams? [facility] If pt is not established with a provider, would they like to be referred to a provider to establish care? No .   Dental Screening: Recommended annual dental exams for proper oral hygiene  Community Resource Referral / Chronic Care Management: CRR required this visit?  No   CCM required this visit?  No      Plan:     1. Encounter for Medicare annual wellness exam   2. Encounter for screening for lung cancer  - CT CHEST LUNG CA SCREEN LOW DOSE W/O CM; Future  I have personally reviewed and noted the following in the patients chart:    Medical and  social history  Use of alcohol, tobacco or illicit drugs   Current medications and supplements  Functional ability and status  Nutritional status  Physical activity  Advanced directives  List of other physicians  Hospitalizations, surgeries, and ER visits in previous 12 months  Vitals  Screenings to include cognitive, depression, and falls  Referrals and appointments  In addition, I have reviewed and discussed with patient certain preventive protocols, quality metrics, and best practice recommendations. A written personalized care plan for preventive services as well as general preventive health recommendations were provided to patient.     Shelda Altes, CMA   03/01/2020    I provided 25 minutes of non-face-to-face time during this encounter.

## 2020-03-01 NOTE — Patient Instructions (Signed)
Ms. [name] , Thank you for taking time to come for your Medicare Wellness Visit. I appreciate your ongoing commitment to your health goals. Please review the following plan we discussed and let me know if I can assist you in the future.   Please continue to practice social distancing to keep you, your family, and our community safe.  If you must go out, please wear a Mask and practice good handwashing.  Screening recommendations/referrals: Colonoscopy: up to date Mammogram: up to date Bone Density: up to date Recommended yearly ophthalmology/optometry visit for glaucoma screening and checkup Recommended yearly dental visit for hygiene and checkup  Vaccinations: up to date  Advanced directives: completed  Conditions/risks identified: Fall  Next appointment: 04/14/2020   Preventive Care 20 Years and Older, Female Preventive care refers to lifestyle choices and visits with your health care provider that can promote health and wellness. What does preventive care include?  A yearly physical exam. This is also called an annual well check.  Dental exams once or twice a year.  Routine eye exams. Ask your health care provider how often you should have your eyes checked.  Personal lifestyle choices, including:  Daily care of your teeth and gums.  Regular physical activity.  Eating a healthy diet.  Avoiding tobacco and drug use.  Limiting alcohol use.  Practicing safe sex.  Taking low-dose aspirin every day.  Taking vitamin and mineral supplements as recommended by your health care provider. What happens during an annual well check? The services and screenings done by your health care provider during your annual well check will depend on your age, overall health, lifestyle risk factors, and family history of disease. Counseling  Your health care provider may ask you questions about your:  Alcohol use.  Tobacco use.  Drug use.  Emotional well-being.  Home and  relationship well-being.  Sexual activity.  Eating habits.  History of falls.  Memory and ability to understand (cognition).  Work and work Statistician.  Reproductive health. Screening  You may have the following tests or measurements:  Height, weight, and BMI.  Blood pressure.  Lipid and cholesterol levels. These may be checked every 5 years, or more frequently if you are over 61 years old.  Skin check.  Lung cancer screening. You may have this screening every year starting at age 37 if you have a 30-pack-year history of smoking and currently smoke or have quit within the past 15 years.  Fecal occult blood test (FOBT) of the stool. You may have this test every year starting at age 37.  Flexible sigmoidoscopy or colonoscopy. You may have a sigmoidoscopy every 5 years or a colonoscopy every 10 years starting at age 33.  Hepatitis C blood test.  Hepatitis B blood test.  Sexually transmitted disease (STD) testing.  Diabetes screening. This is done by checking your blood sugar (glucose) after you have not eaten for a while (fasting). You may have this done every 1-3 years.  Bone density scan. This is done to screen for osteoporosis. You may have this done starting at age 50.  Mammogram. This may be done every 1-2 years. Talk to your health care provider about how often you should have regular mammograms. Talk with your health care provider about your test results, treatment options, and if necessary, the need for more tests. Vaccines  Your health care provider may recommend certain vaccines, such as:  Influenza vaccine. This is recommended every year.  Tetanus, diphtheria, and acellular pertussis (Tdap, Td) vaccine. You  may need a Td booster every 10 years.  Zoster vaccine. You may need this after age 83.  Pneumococcal 13-valent conjugate (PCV13) vaccine. One dose is recommended after age 62.  Pneumococcal polysaccharide (PPSV23) vaccine. One dose is recommended after  age 58. Talk to your health care provider about which screenings and vaccines you need and how often you need them. This information is not intended to replace advice given to you by your health care provider. Make sure you discuss any questions you have with your health care provider. Document Released: 08/05/2015 Document Revised: 03/28/2016 Document Reviewed: 05/10/2015 Elsevier Interactive Patient Education  2017 [place] Prevention in the Home Falls can cause injuries. They can happen to people of all ages. There are many things you can do to make your home safe and to help prevent falls. What can I do on the outside of my home?  Regularly fix the edges of walkways and driveways and fix any cracks.  Remove anything that might make you trip as you walk through a door, such as a raised step or threshold.  Trim any bushes or trees on the path to your home.  Use bright outdoor lighting.  Clear any walking paths of anything that might make someone trip, such as rocks or tools.  Regularly check to see if handrails are loose or broken. Make sure that both sides of any steps have handrails.  Any raised decks and porches should have guardrails on the edges.  Have any leaves, snow, or ice cleared regularly.  Use sand or salt on walking paths during winter.  Clean up any spills in your garage right away. This includes oil or grease spills. What can I do in the bathroom?  Use night lights.  Install grab bars by the toilet and in the tub and shower. Do not use towel bars as grab bars.  Use non-skid mats or decals in the tub or shower.  If you need to sit down in the shower, use a plastic, non-slip stool.  Keep the floor dry. Clean up any water that spills on the floor as soon as it happens.  Remove soap buildup in the tub or shower regularly.  Attach bath mats securely with double-sided non-slip rug tape.  Do not have throw rugs and other things on the floor that can  make you trip. What can I do in the bedroom?  Use night lights.  Make sure that you have a light by your bed that is easy to reach.  Do not use any sheets or blankets that are too big for your bed. They should not hang down onto the floor.  Have a firm chair that has side arms. You can use this for support while you get dressed.  Do not have throw rugs and other things on the floor that can make you trip. What can I do in the kitchen?  Clean up any spills right away.  Avoid walking on wet floors.  Keep items that you use a lot in easy-to-reach places.  If you need to reach something above you, use a strong step stool that has a grab bar.  Keep electrical cords out of the way.  Do not use floor polish or wax that makes floors slippery. If you must use wax, use non-skid floor wax.  Do not have throw rugs and other things on the floor that can make you trip. What can I do with my stairs?  Do not leave any items  on the stairs.  Make sure that there are handrails on both sides of the stairs and use them. Fix handrails that are broken or loose. Make sure that handrails are as long as the stairways.  Check any carpeting to make sure that it is firmly attached to the stairs. Fix any carpet that is loose or worn.  Avoid having throw rugs at the top or bottom of the stairs. If you do have throw rugs, attach them to the floor with carpet tape.  Make sure that you have a light switch at the top of the stairs and the bottom of the stairs. If you do not have them, ask someone to add them for you. What else can I do to help prevent falls?  Wear shoes that:  Do not have high heels.  Have rubber bottoms.  Are comfortable and fit you well.  Are closed at the toe. Do not wear sandals.  If you use a stepladder:  Make sure that it is fully opened. Do not climb a closed stepladder.  Make sure that both sides of the stepladder are locked into place.  Ask someone to hold it for you,  if possible.  Clearly mark and make sure that you can see:  Any grab bars or handrails.  First and last steps.  Where the edge of each step is.  Use tools that help you move around (mobility aids) if they are needed. These include:  Canes.  Walkers.  Scooters.  Crutches.  Turn on the lights when you go into a dark area. Replace any light bulbs as soon as they burn out.  Set up your furniture so you have a clear path. Avoid moving your furniture around.  If any of your floors are uneven, fix them.  If there are any pets around you, be aware of where they are.  Review your medicines with your doctor. Some medicines can make you feel dizzy. This can increase your chance of falling. Ask your doctor what other things that you can do to help prevent falls. This information is not intended to replace advice given to you by your health care provider. Make sure you discuss any questions you have with your health care provider. Document Released: 05/05/2009 Document Revised: 12/15/2015 Document Reviewed: 08/13/2014 Elsevier Interactive Patient Education  2017 [employer].

## 2020-03-02 ENCOUNTER — Telehealth: Payer: Medicare Other | Admitting: Family Medicine

## 2020-03-18 ENCOUNTER — Encounter: Payer: Self-pay | Admitting: Gastroenterology

## 2020-03-18 ENCOUNTER — Other Ambulatory Visit: Payer: Self-pay

## 2020-03-18 ENCOUNTER — Ambulatory Visit: Payer: Medicare Other | Admitting: Gastroenterology

## 2020-03-18 VITALS — BP 162/84 | HR 105 | Temp 97.3°F | Ht 67.0 in | Wt 182.6 lb

## 2020-03-18 DIAGNOSIS — R748 Abnormal levels of other serum enzymes: Secondary | ICD-10-CM | POA: Diagnosis not present

## 2020-03-18 DIAGNOSIS — K5900 Constipation, unspecified: Secondary | ICD-10-CM | POA: Diagnosis not present

## 2020-03-18 DIAGNOSIS — K219 Gastro-esophageal reflux disease without esophagitis: Secondary | ICD-10-CM

## 2020-03-18 NOTE — Progress Notes (Signed)
Referring Provider: Fayrene Helper, MD Primary Care Physician:  Fayrene Helper, MD  Primary GI: Dr. Abbey Chatters  Chief Complaint  Patient presents with   Gastroesophageal Reflux    doing better with nexium   Constipation    HPI:   [name] is a 79 y.o. female presenting today with a history of PUD in remote past, GERD, constipation, with next colonoscopy in 2024 if benefits outweigh the risks. Isolated mildly elevated alk phos noted since 2019. Fatty liver on CT recently. No elevated transaminases. Recent EGD with empiric dilation in July 2021. Due for repeat HFP due to mildly elevated alk phos.   Metamucil capsules: not helping. Has to take 5 capsules. Has to take a laxative as well. MOM as needed about once per week. Linzess was too expensive (45$).   Nexium once per day. Doing well with this. No GERD exacerbations. No dysphagia. Feels much improved after empiric dilation.   Past Medical History:  Diagnosis Date   COPD (chronic obstructive pulmonary disease) ([place])    Hiatal hernia without gangrene and obstruction 03/23/2015   Hypertension 2004   Lung nodule seen on imaging study    Nicotine addiction    OA (osteoarthritis) of knee    Obesity    Sinusitis     Past Surgical History:  Procedure Laterality Date   bilateral cataract surgery  7/27 & 03/01/09   Dr. Gershon Crane     cholecystectomy     COLONOSCOPY  04/2004   Dr. Leane Para Smith-->melanosis coli   COLONOSCOPY WITH ESOPHAGOGASTRODUODENOSCOPY (EGD) N/A 02/25/2013   Dr. Fields:Normal mucosa in the terminal ileum/Severe melanosis throughout the entire examined colon/ONE COLON POLYP REMOVED (tubular adenoma)/Mild diverticulosis  in the sigmoid colon/EGD:The mucosa of the esophagus appeared normal/Non-erosive gastritis, empiric dilation with Savary dilator   ESOPHAGOGASTRODUODENOSCOPY  11/07/2010   Jenkins:hiatal hernia/no evidence of Barrett esophagitis.  The Z-line was noted to be at 39 cm from the  teeth. CLO test negative   ESOPHAGOGASTRODUODENOSCOPY N/A 09/11/2013   Dr.Fields-  incomplete esophageal web in mid-esophagus, medium sized hialtal hernia, PUD. bx=focal erosion with inflammation and fibrosis   ESOPHAGOGASTRODUODENOSCOPY N/A 07/06/2014   healed ulcers, persistent erosions on aspirin, small hiatal hernia   ESOPHAGOGASTRODUODENOSCOPY N/A 02/05/2020   normal esophagus s/p dilation, normal stomach, normal duodenal bulb.    knee arthroscopy right     LEFT HEART CATHETERIZATION WITH CORONARY ANGIOGRAM N/A 04/08/2014   Procedure: LEFT HEART CATHETERIZATION WITH CORONARY ANGIOGRAM;  Surgeon: Sinclair Grooms, MD;  Location: [facility] CATH LAB;  Service: Cardiovascular;  Laterality: N/A;   MALONEY DILATION N/A 02/25/2013   Procedure: Venia Minks DILATION;  Surgeon: Danie Binder, MD;  Location: [facility] ENDO SUITE;  Service: Endoscopy;  Laterality: N/A;   PARTIAL HYSTERECTOMY     SAVORY DILATION N/A 02/25/2013   Procedure: SAVORY DILATION;  Surgeon: Danie Binder, MD;  Location: [facility] ENDO SUITE;  Service: Endoscopy;  Laterality: N/A;   total knee arthroplasty right  05/29/2005   Dr. Aline Brochure     Current Outpatient Medications  Medication Sig Dispense Refill   ALOE VERA PO Take 500 mg by mouth daily as needed (stomach).      amLODipine (NORVASC) 10 MG tablet TAKE 1 TABLET(10 MG) BY MOUTH DAILY 30 tablet 3   aspirin EC 81 MG tablet Take 81 mg by mouth daily.     Calcium Carb-Cholecalciferol (CALCIUM 600/VITAMIN D3) 600-800 MG-UNIT TABS Take 1 tablet by mouth daily.     esomeprazole (Prichard)  40 MG capsule Take 40 mg by mouth daily.     fluticasone (FLONASE) 50 MCG/ACT nasal spray Place 2 sprays into both nostrils daily as needed for allergies or rhinitis. 16 g 2   Multiple Vitamin (MULTIVITAMIN) tablet Take 2 tablets by mouth daily.      Omega 3 1000 MG CAPS Take 1 capsule by mouth daily.     OVER THE COUNTER MEDICATION Beet powder at night     Psyllium (METAMUCIL) 0.36 g CAPS Take 5  capsules by mouth as needed.     triamterene-hydrochlorothiazide (MAXZIDE-25) 37.5-25 MG tablet Take one and a half tablets once daily by mouth  For blood pressure 45 tablet 3   nicotine (NICODERM CQ) 7 mg/24hr patch Place 1 patch (7 mg total) onto the skin daily. (Patient not taking: Reported on 03/18/2020) 28 patch 1   sulfamethoxazole-trimethoprim (BACTRIM DS) 800-160 MG tablet Take 1 tablet by mouth 2 (two) times daily. (Patient not taking: Reported on 03/18/2020) 20 tablet 0   No current facility-administered medications for this visit.    Allergies as of 03/18/2020   (No Known Allergies)    Family History  Problem Relation Age of Onset   Diabetes Mother    Hypertension Mother    Coronary artery disease Mother    Pneumonia Father    Hypertension Sister    Hypertension Sister    Hypertension Brother        Psychologist, forensic   Diabetes Brother    Hypertension Brother    Colon cancer Neg Hx    Liver disease Neg Hx     Social History   Socioeconomic History   Marital status: Widowed    Spouse name: Not on file   Number of children: 4   Years of education: Not on file   Highest education level: Not on file  Occupational History   Occupation: employed     Employer: RETIRED  Tobacco Use   Smoking status: Current Some Day Smoker    Packs/day: 0.10    Years: 10.00    Pack years: 1.00    Types: Cigarettes    Start date: 07/23/1962   Smokeless tobacco: Never Used   Tobacco comment: 1 per day   Vaping Use   Vaping Use: Never used  Substance and Sexual Activity   Alcohol use: No    Alcohol/week: 0.0 standard drinks   Drug use: No   Sexual activity: Never  Other Topics Concern   Not on file  Social History Narrative   Not on file   Social Determinants of Health   Financial Resource Strain: Low Risk    Difficulty of Paying Living Expenses: Not hard at all  Food Insecurity: No Food Insecurity   Worried About Charity fundraiser in the Last  Year: Never true   [place] in the Last Year: Never true  Transportation Needs: No Transportation Needs   Lack of Transportation (Medical): No   Lack of Transportation (Non-Medical): No  Physical Activity: Insufficiently Active   Days of Exercise per Week: 3 days   Minutes of Exercise per Session: 30 min  Stress: No Stress Concern Present   Feeling of Stress : Not at all  Social Connections: Moderately Isolated   Frequency of Communication with Friends and Family: More than three times a week   Frequency of Social Gatherings with Friends and Family: More than three times a week   Attends Religious Services: More than 4 times per year  Active Member of Clubs or Organizations: No   Attends Archivist Meetings: Never   Marital Status: Widowed    Review of Systems: Gen: Denies fever, chills, anorexia. Denies fatigue, weakness, weight loss.  CV: Denies chest pain, palpitations, syncope, peripheral edema, and claudication. Resp: Denies dyspnea at rest, cough, wheezing, coughing up blood, and pleurisy. GI: see HPI Derm: Denies rash, itching, dry skin Psych: Denies depression, anxiety, memory loss, confusion. No homicidal or suicidal ideation.  Heme: Denies bruising, bleeding, and enlarged lymph nodes.  Physical Exam: BP (!) 162/84    Pulse (!) 105    Temp (!) 97.3 F (36.3 C) (Temporal)    Ht 5' 7"  (1.702 m)    Wt 182 lb 9.6 oz (82.8 kg)    BMI 28.60 kg/m  General:   Alert and oriented. No distress noted. Pleasant and cooperative.  Head:  Normocephalic and atraumatic. Eyes:  Conjuctiva clear without scleral icterus. Mouth:  Mask in place Abdomen:  +BS, soft, non-tender and non-distended. Possible ventral hernia vs rectus diastasis. No rebound or guarding. No HSM or masses noted. Msk:  Symmetrical without gross deformities. Normal posture. Extremities:  Without edema. Neurologic:  Alert and  oriented x4 Psych:  Alert and cooperative. Normal mood and  affect.  ASSESSMENT: [name] is a 79 y.o. female presenting today with history of GERD, previously early satiety but recent EGD normal and improvement noted after empiric dilation, now continuing to deal with chronic constipation not ideally managed.  Will trial Benefiber instead of Metamucil, as she has a hard time tolerating the numerous capsules she has been taking (Metamucil capsules 5 at a time). May need to add Miralax. Linzess covered by insurance but remains out of price range (45$). Will try to stick with OTC agents that are cost-effective.  Mildly elevated alk phos with normal transaminases in setting of fatty liver: recheck now.    PLAN:  Continue Nexium  Trial of Benefiber instead of Metamucil: call with update  Repeat HFP with upcoming labs by Dr. Moshe Cipro: possible further serologies.  Return in 6 months.  Annitta Needs, PhD, ANP-BC [facility]

## 2020-03-18 NOTE — Patient Instructions (Signed)
Continue Nexium daily, 30 minutes before breakfast.  Instead of Metamucil, let's try Benefiber. Take 2 teaspoons up to three times a day mixed in beverage of your choice.  Please complete blood work when you do the additional labs for Dr. Moshe Cipro.  We will see you in 6 months! Please let me know if constipation not improved.  I enjoyed seeing you again today! As you know, I value our relationship and want to provide genuine, compassionate, and quality care. I welcome your feedback. If you receive a survey regarding your visit,  I greatly appreciate you taking time to fill this out. See you next time!  Annitta Needs, PhD, ANP-BC [facility]astroenter[facility]

## 2020-03-18 NOTE — Progress Notes (Signed)
CC'ED TO PCP 

## 2020-03-30 ENCOUNTER — Ambulatory Visit: Payer: Medicare Other | Admitting: Gastroenterology

## 2020-04-04 ENCOUNTER — Other Ambulatory Visit: Payer: Self-pay | Admitting: "Endocrinology

## 2020-04-04 DIAGNOSIS — I10 Essential (primary) hypertension: Secondary | ICD-10-CM | POA: Diagnosis not present

## 2020-04-04 DIAGNOSIS — Z1159 Encounter for screening for other viral diseases: Secondary | ICD-10-CM | POA: Diagnosis not present

## 2020-04-04 DIAGNOSIS — E559 Vitamin D deficiency, unspecified: Secondary | ICD-10-CM | POA: Diagnosis not present

## 2020-04-04 DIAGNOSIS — R748 Abnormal levels of other serum enzymes: Secondary | ICD-10-CM | POA: Diagnosis not present

## 2020-04-05 LAB — BMP8+EGFR
BUN/Creatinine Ratio: 9 — ABNORMAL LOW (ref 12–28)
BUN: 10 mg/dL (ref 8–27)
CO2: 28 mmol/L (ref 20–29)
Calcium: 9.4 mg/dL (ref 8.7–10.3)
Chloride: 97 mmol/L (ref 96–106)
Creatinine, Ser: 1.11 mg/dL — ABNORMAL HIGH (ref 0.57–1.00)
GFR calc Af Amer: 55 mL/min/{1.73_m2} — ABNORMAL LOW (ref >59)
GFR calc non Af Amer: 48 mL/min/{1.73_m2} — ABNORMAL LOW (ref >59)
Glucose: 109 mg/dL — ABNORMAL HIGH (ref 65–99)
Potassium: 3.6 mmol/L (ref 3.5–5.2)
Sodium: 140 mmol/L (ref 134–144)

## 2020-04-05 LAB — CBC
Hematocrit: 40.6 % (ref 34.0–46.6)
Hemoglobin: 13.6 g/dL (ref 11.1–15.9)
MCH: 28.8 pg (ref 26.6–33.0)
MCHC: 33.5 g/dL (ref 31.5–35.7)
MCV: 86 fL (ref 79–97)
Platelets: 324 10*3/uL (ref 150–450)
RBC: 4.73 x10E6/uL (ref 3.77–5.28)
RDW: 14.7 % (ref 11.7–15.4)
WBC: 7.7 10*3/uL (ref 3.4–10.8)

## 2020-04-05 LAB — TSH: TSH: 0.599 u[IU]/mL (ref 0.450–4.500)

## 2020-04-05 LAB — HEPATIC FUNCTION PANEL
ALT: 9 IU/L (ref 0–32)
AST: 14 IU/L (ref 0–40)
Albumin: 4.2 g/dL (ref 3.7–4.7)
Alkaline Phosphatase: 121 IU/L (ref 44–121)
Bilirubin Total: 0.6 mg/dL (ref 0.0–1.2)
Bilirubin, Direct: 0.17 mg/dL (ref 0.00–0.40)
Total Protein: 7.5 g/dL (ref 6.0–8.5)

## 2020-04-05 LAB — HEPATITIS C ANTIBODY: Hep C Virus Ab: <0.1 s/co ratio (ref 0.0–0.9)

## 2020-04-05 LAB — VITAMIN D 25 HYDROXY (VIT D DEFICIENCY, FRACTURES): Vit D, 25-Hydroxy: 30.8 ng/mL (ref 30.0–100.0)

## 2020-04-07 ENCOUNTER — Telehealth: Payer: Self-pay | Admitting: Family Medicine

## 2020-04-07 NOTE — Telephone Encounter (Signed)
They are only needed if they are required yearly by her job. Her last one was 09/01/18.

## 2020-04-07 NOTE — Telephone Encounter (Signed)
Pt would like to know if she is due to get a TB shot. [phone]

## 2020-04-14 ENCOUNTER — Other Ambulatory Visit: Payer: Self-pay

## 2020-04-14 ENCOUNTER — Encounter: Payer: Self-pay | Admitting: Family Medicine

## 2020-04-14 ENCOUNTER — Ambulatory Visit (INDEPENDENT_AMBULATORY_CARE_PROVIDER_SITE_OTHER): Payer: Medicare Other | Admitting: Family Medicine

## 2020-04-14 VITALS — BP 163/79 | HR 90 | Resp 16 | Ht 67.0 in | Wt 182.0 lb

## 2020-04-14 DIAGNOSIS — I10 Essential (primary) hypertension: Secondary | ICD-10-CM | POA: Diagnosis not present

## 2020-04-14 DIAGNOSIS — R7303 Prediabetes: Secondary | ICD-10-CM | POA: Diagnosis not present

## 2020-04-14 DIAGNOSIS — Z23 Encounter for immunization: Secondary | ICD-10-CM

## 2020-04-14 DIAGNOSIS — E663 Overweight: Secondary | ICD-10-CM

## 2020-04-14 DIAGNOSIS — F17200 Nicotine dependence, unspecified, uncomplicated: Secondary | ICD-10-CM

## 2020-04-14 DIAGNOSIS — E785 Hyperlipidemia, unspecified: Secondary | ICD-10-CM | POA: Diagnosis not present

## 2020-04-14 DIAGNOSIS — F17210 Nicotine dependence, cigarettes, uncomplicated: Secondary | ICD-10-CM | POA: Diagnosis not present

## 2020-04-14 MED ORDER — AMLODIPINE BESYLATE 10 MG PO TABS: ORAL_TABLET | ORAL | 5 refills | Status: DC

## 2020-04-14 NOTE — Patient Instructions (Signed)
F/U in office with MD re evaluate blood pressure and smoking in 8 to 6  weeks, call if you need me before  Fasting lipid panel  2 to 5 days before next visit  Flu vaccine today  Need to take your TWO blood pressure pills eVERY day, amlodipine and triamterene  Please try to set a quit date by thanksgiving , now smoking on average 2 /d ay  It is important that you exercise regularly at least 30 minutes 5 times a week. If you develop chest pain, have severe difficulty breathing, or feel very tired, stop exercising immediately and seek medical attention   Think about what you will eat, plan ahead. Choose " clean, green, fresh or frozen" over canned, processed or packaged foods which are more sugary, salty and fatty. 70 to 75% of food eaten should be vegetables and fruit. Three meals at set times with snacks allowed between meals, but they must be fruit or vegetables. Aim to eat over a 12 hour period , example 7 am to 7 pm, and STOP after  your last meal of the day. Drink water,generally about 64 ounces per day, no other drink is as healthy. Fruit juice is best enjoyed in a healthy way, by EATING the fruit.  Thanks for choosing [facility], we consider it a privelige to serve you.

## 2020-04-18 ENCOUNTER — Other Ambulatory Visit: Payer: Self-pay

## 2020-04-18 ENCOUNTER — Ambulatory Visit (INDEPENDENT_AMBULATORY_CARE_PROVIDER_SITE_OTHER): Payer: Medicare Other

## 2020-04-18 DIAGNOSIS — Z111 Encounter for screening for respiratory tuberculosis: Secondary | ICD-10-CM

## 2020-04-18 NOTE — Assessment & Plan Note (Signed)
Asked:confirms currently smokes cigarettes 2/day Assess: Unwilling to set a quit date, Advise: needs to QUIT to reduce risk of cancer, cardio and cerebrovascular disease Assist: counseled for 5 minutes and literature provided Arrange: follow up in 2 to 4 months

## 2020-04-18 NOTE — Assessment & Plan Note (Signed)
Patient educated about the importance of limiting  Carbohydrate intake , the need to commit to daily physical activity for a minimum of 30 minutes , and to commit weight loss. The fact that changes in all these areas will reduce or eliminate all together the development of diabetes is stressed. Updated lab needed at/ before next visit.   Diabetic Labs Latest Ref Rng & Units 04/04/2020 09/03/2019 04/28/2019 12/17/2018 05/30/2018  HbA1c 4.8 - 5.6 % - - - 5.7(H) 5.9(H)  Microalbumin 0.00 - 1.89 mg/dL - - - - -  Micro/Creat Ratio 0.0 - 30.0 mg/g - - - - -  Chol 100 - 199 mg/dL - 167 240(H) 242(H) 157  HDL >39 mg/dL - 52 50 44 47  Calc LDL 0 - 99 mg/dL - 97 161(H) 161(H) 91  Triglycerides 0 - 149 mg/dL - 101 158(H) 184(H) 94  Creatinine 0.57 - 1.00 mg/dL 1.11(H) 1.14(H) 1.27(H) 1.06(H) 1.15(H)   BP/Weight 04/14/2020 03/18/2020 03/01/2020 02/11/2020 02/05/2020 10/30/4942 01/23/9583  Systolic BP 417 127 871 836 725 500 164  Diastolic BP 79 84 82 82 67 98 72  Wt. (Lbs) 182 182.6 182 182 182 184.2 183  BMI 28.51 28.6 28.51 28.51 28.51 28.85 28.66   Foot/eye exam completion dates 05/02/2010  Foot exam Order yes  Foot Form Completion -

## 2020-04-18 NOTE — Assessment & Plan Note (Signed)
Uncontrolled, unintentionally under dosing, re educated re meds she needs to take DASH diet and commitment to daily physical activity for a minimum of 30 minutes discussed and encouraged, as a part of hypertension management. The importance of attaining a healthy weight is also discussed.  BP/Weight 04/14/2020 03/18/2020 03/01/2020 02/11/2020 02/05/2020 08/31/9369 12/30/6787  Systolic BP 381 017 510 258 527 782 423  Diastolic BP 79 84 82 82 67 98 72  Wt. (Lbs) 182 182.6 182 182 182 184.2 183  BMI 28.51 28.6 28.51 28.51 28.51 28.85 28.66

## 2020-04-18 NOTE — Progress Notes (Signed)
[name]     MRN: [id number]      DOB: 03-Oct-1940   HPI [name] is here for follow up and re-evaluation of chronic medical conditions,in particular uncontrolled hypertension,  medication management and review of any available recent lab and radiology data.  Preventive health is updated, specifically  Cancer screening and Immunization.    The PT denies any adverse reactions to current medications since the last visit. Unfortunately, she hjas been taking 1 instead of 2 blood prssure tablets, unintentionally There are no new concerns.  There are no specific complaints   ROS Denies recent fever or chills. Denies sinus pressure, nasal congestion, ear pain or sore throat. Denies chest congestion, productive cough or wheezing. Denies chest pains, palpitations and leg swelling Denies abdominal pain, nausea, vomiting,diarrhea or constipation.   Denies dysuria, frequency, hesitancy or incontinence. C/o  joint pain,  and limitation in mobility. Denies headaches, seizures, numbness, or tingling. Denies depression, anxiety or insomnia. Denies skin break down or rash.   PE  BP (!) 163/79   Pulse 90   Resp 16   Ht 5\' 7"  (1.702 Laura)   Wt 182 lb (82.6 kg)   SpO2 96%   BMI 28.51 kg/Laura   Patient alert and oriented and in no cardiopulmonary distress.  HEENT: No facial asymmetry, EOMI,     Neck supple .  Chest: Clear to auscultation bilaterally.  CVS: S1, S2 no murmurs, no S3.Regular rate.  ABD: Soft non tender.   Ext: No edema  [id number]  ROM spine, shoulders, hips and knees.  Skin: Intact, no ulcerations or rash noted.  Psych: Good eye contact, normal affect. Memory intact not anxious or depressed appearing.  CNS: CN 2-12 intact, power,  normal throughout.no focal deficits noted.   Assessment & Plan  Essential hypertension Uncontrolled, unintentionally under dosing, re educated re meds she needs to take DASH diet and commitment to daily physical activity for a  minimum of 30 minutes discussed and encouraged, as a part of hypertension management. The importance of attaining a healthy weight is also discussed.  BP/Weight 04/14/2020 03/18/2020 03/01/2020 02/11/2020 02/05/2020 08/24/252 08/29/621  Systolic BP 762 831 517 616 073 710 626  Diastolic BP 79 84 82 82 67 98 72  Wt. (Lbs) 182 182.6 182 182 182 184.2 183  BMI 28.51 28.6 28.51 28.51 28.51 28.85 28.66       NICOTINE ADDICTION Asked:confirms currently smokes cigarettes 2/day Assess: Unwilling to set a quit date, Advise: needs to QUIT to reduce risk of cancer, cardio and cerebrovascular disease Assist: counseled for 5 minutes and literature provided Arrange: follow up in 2 to 4 months   Hyperlipidemia LDL goal <100    Prediabetes Patient educated about the importance of limiting  Carbohydrate intake , the need to commit to daily physical activity for a minimum of 30 minutes , and to commit weight loss. The fact that changes in all these areas will reduce or eliminate all together the development of diabetes is stressed. Updated lab needed at/ before next visit.   Diabetic Labs Latest Ref Rng & Units 04/04/2020 09/03/2019 04/28/2019 12/17/2018 05/30/2018  HbA1c 4.8 - 5.6 % - - - 5.7(H) 5.9(H)  Microalbumin 0.00 - 1.89 mg/dL - - - - -  Micro/Creat Ratio 0.0 - 30.0 mg/g - - - - -  Chol 100 - 199 mg/dL - 167 240(H) 242(H) 157  HDL >39 mg/dL - 52 50 44 47  Calc LDL 0 - 99 mg/dL - 97  161(H) 161(H) 91  Triglycerides 0 - 149 mg/dL - 101 158(H) 184(H) 94  Creatinine 0.57 - 1.00 mg/dL 1.11(H) 1.14(H) 1.27(H) 1.06(H) 1.15(H)   BP/Weight 04/14/2020 03/18/2020 03/01/2020 02/11/2020 02/05/2020 10/28/3505 11/27/3223  Systolic BP 672 091 980 221 798 102 548  Diastolic BP 79 84 82 82 67 98 72  Wt. (Lbs) 182 182.6 182 182 182 184.2 183  BMI 28.51 28.6 28.51 28.51 28.51 28.85 28.66   Foot/eye exam completion dates 05/02/2010  Foot exam Order yes  Foot Form Completion -       Overweight (BMI  25.0-29.9)  Patient re-educated about  the importance of commitment to a  minimum of 150 minutes of exercise per week as able.  The importance of healthy food choices with portion control discussed, as well as eating regularly and within a 12 hour window most days. The need to choose "clean , green" food 50 to 75% of the time is discussed, as well as to make water the primary drink and set a goal of 64 ounces water daily.    Weight /BMI 04/14/2020 03/18/2020 03/01/2020  WEIGHT 182 lb 182 lb 9.6 oz 182 lb  HEIGHT 5\' 7"  5\' 7"  5\' 7"   BMI 28.51 kg/m2 28.6 kg/m2 28.51 kg/m2

## 2020-04-18 NOTE — Assessment & Plan Note (Signed)
  Patient re-educated about  the importance of commitment to a  minimum of 150 minutes of exercise per week as able.  The importance of healthy food choices with portion control discussed, as well as eating regularly and within a 12 hour window most days. The need to choose "clean , green" food 50 to 75% of the time is discussed, as well as to make water the primary drink and set a goal of 64 ounces water daily.    Weight /BMI 04/14/2020 03/18/2020 03/01/2020  WEIGHT 182 lb 182 lb 9.6 oz 182 lb  HEIGHT 5\' 7"  5\' 7"  5\' 7"   BMI 28.51 kg/m2 28.6 kg/m2 28.51 kg/m2

## 2020-04-20 LAB — TB SKIN TEST
Induration: 0 mm
TB Skin Test: NEGATIVE

## 2020-05-11 ENCOUNTER — Telehealth: Payer: Self-pay | Admitting: Orthopedic Surgery

## 2020-05-11 NOTE — Telephone Encounter (Signed)
Called patient to notify handicapped placard/disabilty form is ready for pick up.

## 2020-05-25 ENCOUNTER — Other Ambulatory Visit: Payer: Self-pay

## 2020-05-25 ENCOUNTER — Other Ambulatory Visit: Payer: Self-pay | Admitting: Family Medicine

## 2020-05-25 ENCOUNTER — Encounter: Payer: Self-pay | Admitting: Internal Medicine

## 2020-05-25 ENCOUNTER — Ambulatory Visit (INDEPENDENT_AMBULATORY_CARE_PROVIDER_SITE_OTHER): Payer: Medicare Other | Admitting: Internal Medicine

## 2020-05-25 DIAGNOSIS — J0100 Acute maxillary sinusitis, unspecified: Secondary | ICD-10-CM | POA: Diagnosis not present

## 2020-05-25 DIAGNOSIS — N3000 Acute cystitis without hematuria: Secondary | ICD-10-CM

## 2020-05-25 DIAGNOSIS — J302 Other seasonal allergic rhinitis: Secondary | ICD-10-CM | POA: Diagnosis not present

## 2020-05-25 MED ORDER — AMOXICILLIN-POT CLAVULANATE 875-125 MG PO TABS: 1.0000 | ORAL_TABLET | Freq: Two times a day (BID) | ORAL | 0 refills | Status: DC

## 2020-05-25 NOTE — Progress Notes (Signed)
Virtual Visit via Telephone Note   This visit type was conducted due to national recommendations for restrictions regarding the COVID-19 Pandemic (e.g. social distancing) in an effort to limit this patient's exposure and mitigate transmission in our community.  Due to her co-morbid illnesses, this patient is at least at moderate risk for complications without adequate follow up.  This format is felt to be most appropriate for this patient at this time.  The patient did not have access to video technology/had technical difficulties with video requiring transitioning to audio format only (telephone).  All issues noted in this document were discussed and addressed.  Evaluation Performed:  Follow-up visit  Date:  05/25/2020   ID:  [name], DOB April 08, 1941, MRN [id number]  Patient Location: Home Provider Location: Home Office  Location of Patient: Home Location of Provider: Telehealth Consent was obtain for visit to be over via telehealth. I verified that I am speaking with the correct person using two identifiers.  PCP:  Fayrene Helper, MD   Chief Complaint:  Nasal congestion and sinus pain  History of Present Illness:    [name] is a 79 y.o. female with PMH of seasonal allergies and recurrent sinusitis has a televisit for complain of nasal congestion and sinus pain.  Sinus Pain: Patient complains of congestion. Symptoms include achiness and facial pain with no fever, chills, night sweats or weight loss. Onset of symptoms was 1 week ago, gradually worsening since that time. She is drinking moderate amounts of fluids. Patient is smoker  (2 cigarettes/day).  She denies any change in taste sensation.  She has received both doses of Covid vaccine.  Patient also complains of dysuria and urinary frequency, but denies hematuria, hesitancy or incontinence.  She denies any flank pain, nausea, or vomiting.   The patient does not have symptoms concerning for COVID-19 infection  (fever, chills, cough, or new shortness of breath).   Past Medical, Surgical, Social History, Allergies, and Medications have been Reviewed.  Past Medical History:  Diagnosis Date  . COPD (chronic obstructive pulmonary disease) ([place])   . Hiatal hernia without gangrene and obstruction 03/23/2015  . Hypertension 2004  . Lung nodule seen on imaging study   . Nicotine addiction   . OA (osteoarthritis) of knee   . Obesity   . Sinusitis    Past Surgical History:  Procedure Laterality Date  . bilateral cataract surgery  7/27 & 03/01/09   Dr. Gershon Crane    . cholecystectomy    . COLONOSCOPY  04/2004   Dr. Leane Para Smith-->melanosis coli  . COLONOSCOPY WITH ESOPHAGOGASTRODUODENOSCOPY (EGD) N/A 02/25/2013   Dr. Fields:Normal mucosa in the terminal ileum/Severe melanosis throughout the entire examined colon/ONE COLON POLYP REMOVED (tubular adenoma)/Mild diverticulosis  in the sigmoid colon/EGD:The mucosa of the esophagus appeared normal/Non-erosive gastritis, empiric dilation with Savary dilator  . ESOPHAGOGASTRODUODENOSCOPY  11/07/2010   Jenkins:hiatal hernia/no evidence of Barrett esophagitis.  The Z-line was noted to be at 39 cm from the teeth. CLO test negative  . ESOPHAGOGASTRODUODENOSCOPY N/A 09/11/2013   Dr.Fields-  incomplete esophageal web in mid-esophagus, medium sized hialtal hernia, PUD. bx=focal erosion with inflammation and fibrosis  . ESOPHAGOGASTRODUODENOSCOPY N/A 07/06/2014   healed ulcers, persistent erosions on aspirin, small hiatal hernia  . ESOPHAGOGASTRODUODENOSCOPY N/A 02/05/2020   normal esophagus s/p dilation, normal stomach, normal duodenal bulb.   Marland Kitchen knee arthroscopy right    . LEFT HEART CATHETERIZATION WITH CORONARY ANGIOGRAM N/A 04/08/2014   Procedure: LEFT HEART CATHETERIZATION WITH CORONARY  Cyril Loosen;  Surgeon: Sinclair Grooms, MD;  Location: [facility] CATH LAB;  Service: Cardiovascular;  Laterality: N/A;  . MALONEY DILATION N/A 02/25/2013   Procedure: Venia Minks DILATION;  Surgeon:  Danie Binder, MD;  Location: [facility] ENDO SU[facility];  Service: Endoscopy;  Laterality: N/A;  . PARTIAL HYSTERECTOMY    . SAVORY DILATION N/A 02/25/2013   Procedure: SAVORY DILATION;  Surgeon: Danie Binder, MD;  Location: [facility] ENDO SUITE;  Service: Endoscopy;  Laterality: N/A;  . total knee arthroplasty right  05/29/2005   Dr. Aline Brochure      Current Meds  Medication Sig  . ALOE VERA PO Take 500 mg by mouth daily as needed (stomach).   Marland Kitchen amLODipine (NORVASC) 10 MG tablet TAKE 1 TABLET(10 MG) BY MOUTH DAILY  . aspirin EC 81 MG tablet Take 81 mg by mouth daily.  . Calcium Carb-Cholecalciferol (CALCIUM 600/VITAMIN D3) 600-800 MG-UNIT TABS Take 1 tablet by mouth daily.  Marland Kitchen esomeprazole (NEXIUM) 40 MG capsule Take 40 mg by mouth daily.  . fluticasone (FLONASE) 50 MCG/ACT nasal spray Place 2 sprays into both nostrils daily as needed for allergies or rhinitis.  . Multiple Vitamin (MULTIVITAMIN) tablet Take 2 tablets by mouth daily.   . Omega 3 1000 MG CAPS Take 1 capsule by mouth daily.  Marland Kitchen OVER THE COUNTER MEDICATION Beet powder at night  . Psyllium (METAMUCIL) 0.36 g CAPS Take 5 capsules by mouth as needed.  . triamterene-hydrochlorothiazide (MAXZIDE-25) 37.5-25 MG tablet Take one and a half tablets once daily by mouth  For blood pressure     Allergies:   Patient has no known allergies.   ROS:   Please see the history of present illness.    Review of Systems  Constitutional: Negative for chills and fever.  HENT: Positive for congestion and sinus pain. Negative for ear discharge, ear pain and sore throat.   Eyes: Negative for pain and redness.  Respiratory: Negative for cough and shortness of breath.   Cardiovascular: Negative for chest pain and palpitations.  Gastrointestinal: Negative for constipation, diarrhea, nausea and vomiting.  Genitourinary: Positive for dysuria and frequency. Negative for hematuria and urgency.  Musculoskeletal: Negative for falls.  Skin: Negative for rash.  Neurological:  Positive for headaches. Negative for dizziness and focal weakness.  Psychiatric/Behavioral: Negative for depression and suicidal ideas.    All other systems reviewed and are negative.   Labs/Other Tests and Data Reviewed:    Recent Labs: 04/04/2020: ALT 9; BUN 10; Creatinine, Ser 1.11; Hemoglobin 13.6; Platelets 324; Potassium 3.6; Sodium 140; TSH 0.599   Recent Lipid Panel Lab Results  Component Value Date/Time   CHOL 167 09/03/2019 09:19 AM   TRIG 101 09/03/2019 09:19 AM   HDL 52 09/03/2019 09:19 AM   CHOLHDL 4.1 04/23/2018 11:34 AM   CHOLHDL 4.1 07/04/2016 12:04 PM   LDLCALC 97 09/03/2019 09:19 AM    Wt Readings from Last 3 Encounters:  04/14/20 182 lb (82.6 kg)  03/18/20 182 lb 9.6 oz (82.8 kg)  03/01/20 182 lb (82.6 kg)      ASSESSMENT & PLAN:   Acute bacterial sinusitis Nasal congestion and maxillary sinus pain for about a week Has tried over-the-counter medications Prescribed Augmentin Okay to use DayQuil/NyQuil for runny nose, nasal congestion  Seasonal allergies Continue Flonase as needed  UTI Simple acute cystitis likely Augmentin prescribed mainly for sinusitis will cover most likely organisms  Time:   Today, I have spent 17 minutes with the patient with telehealth technology discussing the above  problems.     Medication Adjustments/Labs and Tests Ordered: Current medicines are reviewed at length with the patient today.  Concerns regarding medicines are outlined above.   Tests Ordered: No orders of the defined types were placed in this encounter.   Medication Changes: Meds ordered this encounter  Medications  . amoxicillin-clavulanate (AUGMENTIN) 875-125 MG tablet    Sig: Take 1 tablet by mouth 2 (two) times daily.    Dispense:  20 tablet    Refill:  0     Note: This dictation was prepared with Dragon dictation along with smaller phrase technology. Similar sounding words can be transcribed inadequately or may not be corrected upon review. Any  transcriptional errors that result from this process are unintentional.      Disposition:  Follow up  Signed, Lindell Spar, MD  05/25/2020 2:06 PM     [facility]

## 2020-05-25 NOTE — Patient Instructions (Signed)

## 2020-06-02 ENCOUNTER — Ambulatory Visit: Payer: Medicare Other | Attending: Internal Medicine

## 2020-06-02 ENCOUNTER — Encounter: Payer: Self-pay | Admitting: Family Medicine

## 2020-06-02 ENCOUNTER — Ambulatory Visit (INDEPENDENT_AMBULATORY_CARE_PROVIDER_SITE_OTHER): Payer: Medicare Other | Admitting: Family Medicine

## 2020-06-02 ENCOUNTER — Other Ambulatory Visit: Payer: Self-pay

## 2020-06-02 VITALS — BP 160/84 | HR 98 | Resp 16 | Ht 67.0 in | Wt 178.0 lb

## 2020-06-02 DIAGNOSIS — I10 Essential (primary) hypertension: Secondary | ICD-10-CM | POA: Diagnosis not present

## 2020-06-02 DIAGNOSIS — F17210 Nicotine dependence, cigarettes, uncomplicated: Secondary | ICD-10-CM | POA: Diagnosis not present

## 2020-06-02 DIAGNOSIS — E663 Overweight: Secondary | ICD-10-CM

## 2020-06-02 DIAGNOSIS — F17200 Nicotine dependence, unspecified, uncomplicated: Secondary | ICD-10-CM

## 2020-06-02 DIAGNOSIS — Z23 Encounter for immunization: Secondary | ICD-10-CM

## 2020-06-02 DIAGNOSIS — K219 Gastro-esophageal reflux disease without esophagitis: Secondary | ICD-10-CM | POA: Diagnosis not present

## 2020-06-02 MED ORDER — TRIAMTERENE-HCTZ 75-50 MG PO TABS
ORAL_TABLET | ORAL | 3 refills | Status: DC
Start: 2020-06-02 — End: 2021-03-07

## 2020-06-02 NOTE — Patient Instructions (Addendum)
F/U in office with MD re evaluate blood pressure mid to  End January, call if you need me sooner  New higher dose of Maxzide 75/50 take ONE daily  Need  To STOP smoking altogether  drink one smoothie every day  It is important that you exercise regularly at least 30 minutes 5 times a week. If you develop chest pain, have severe difficulty breathing, or feel very tired, stop exercising immediately and seek medical attention    Think about what you will eat, plan ahead. Choose " clean, green, fresh or frozen" over canned, processed or packaged foods which are more sugary, salty and fatty. 70 to 75% of food eaten should be vegetables and fruit. Three meals at set times with snacks allowed between meals, but they must be fruit or vegetables. Aim to eat over a 12 hour period , example 7 am to 7 pm, and STOP after  your last meal of the day. Drink water,generally about 64 ounces per day, no other drink is as healthy. Fruit juice is best enjoyed in a healthy way, by EATING the fruit.

## 2020-06-02 NOTE — Progress Notes (Signed)
   Covid-19 Vaccination Clinic  Name:  NEMA OATLEY    MRN: [id number] DOB: 1940/12/17  06/02/2020  Ms. Howatt was observed post Covid-19 immunization for 15 minutes without incident. She was provided with Vaccine Information Sheet and instruction to access the V-Safe system.   Ms. Boline was instructed to call 911 with any severe reactions post vaccine: Marland Kitchen Difficulty breathing  . Swelling of face and throat  . A fast heartbeat  . A bad rash all over body  . Dizziness and weakness

## 2020-06-05 ENCOUNTER — Encounter: Payer: Self-pay | Admitting: Family Medicine

## 2020-06-05 NOTE — Assessment & Plan Note (Signed)
Asked:confirms currently smokes cigarettes 1 to 2/ day Assess: Unwilling to set a quit date, but is cutting back Advise: needs to QUIT to reduce risk of cancer, cardio and cerebrovascular disease Assist: counseled for 5 minutes and literature provided Arrange: follow up in 2 to 4 months  

## 2020-06-05 NOTE — Assessment & Plan Note (Signed)
  Patient re-educated about  the importance of commitment to a  minimum of 150 minutes of exercise per week as able.  The importance of healthy food choices with portion control discussed, as well as eating regularly and within a 12 hour window most days. The need to choose "clean , green" food 50 to 75% of the time is discussed, as well as to make water the primary drink and set a goal of 64 ounces water daily.    Weight /BMI 06/02/2020 04/14/2020 03/18/2020  WEIGHT 178 lb 182 lb 182 lb 9.6 oz  HEIGHT 5\' 7"  5\' 7"  5\' 7"   BMI 27.88 kg/m2 28.51 kg/m2 28.6 kg/m2

## 2020-06-05 NOTE — Assessment & Plan Note (Signed)
Uncontrolled increase maxzide dose and re evaluate DASH diet and commitment to daily physical activity for a minimum of 30 minutes discussed and encouraged, as a part of hypertension management. The importance of attaining a healthy weight is also discussed.  BP/Weight 06/02/2020 04/14/2020 03/18/2020 03/01/2020 02/11/2020 1/42/7670 07/23/32  Systolic BP 961 164 353 912 258 346 219  Diastolic BP 84 79 84 82 82 67 98  Wt. (Lbs) 178 182 182.6 182 182 182 184.2  BMI 27.88 28.51 28.6 28.51 28.51 28.51 28.85

## 2020-06-05 NOTE — Assessment & Plan Note (Signed)
Controlled, no change in medication  

## 2020-06-05 NOTE — Progress Notes (Signed)
   MARIJAYNE RAUTH     MRN: [id number]      DOB: Dec 01, 1940   HPI [name] is here for follow up and re-evaluation of chronic medical conditions, medication management and review of any available recent lab and radiology data.  Preventive health is updated, specifically  Cancer screening and Immunization.   Questions or concerns regarding consultations or procedures which the PT has had in the interim are  addressed. The PT denies any adverse reactions to current medications since the last visit.  There are no new concerns.  There are no specific complaints   ROS Denies recent fever or chills. Denies sinus pressure, nasal congestion, ear pain or sore throat. Denies chest congestion, productive cough or wheezing. Denies chest pains, palpitations and leg swelling Denies abdominal pain, nausea, vomiting,diarrhea or constipation.   Denies dysuria, frequency, hesitancy or incontinence. Denies joint pain, swelling and limitation in mobility. Denies headaches, seizures, numbness, or tingling. Denies depression, anxiety or insomnia. Denies skin break down or rash.   PE  BP (!) 160/84   Pulse 98   Resp 16   Ht 5\' 7"  (1.702 m)   Wt 178 lb (80.7 kg)   SpO2 94%   BMI 27.88 kg/m   Patient alert and oriented and in no cardiopulmonary distress.  HEENT: No facial asymmetry, EOMI,     Neck supple .  Chest: Clear to auscultation bilaterally.  CVS: S1, S2 no murmurs, no S3.Regular rate.  ABD: Soft non tender.   Ext: No edema  MS: Adequate ROM spine, shoulders, hips and knees.  Skin: Intact, no ulcerations or rash noted.  Psych: Good eye contact, normal affect. Memory intact not anxious or depressed appearing.  CNS: CN 2-12 intact, power,  normal throughout.no focal deficits noted.   Assessment & Plan  NICOTINE ADDICTION Asked:confirms currently smokes cigarettes 1 to 2 / day Assess: Unwilling to set a quit date, but is cutting back Advise: needs to QUIT to reduce risk of  cancer, cardio and cerebrovascular disease Assist: counseled for 5 minutes and literature provided Arrange: follow up in 2 to 4 months   Essential hypertension Uncontrolled increase maxzide dose and re evaluate DASH diet and commitment to daily physical activity for a minimum of 30 minutes discussed and encouraged, as a part of hypertension management. The importance of attaining a healthy weight is also discussed.  BP/Weight 06/02/2020 04/14/2020 03/18/2020 03/01/2020 02/11/2020 0/04/2724 09/26/6438  Systolic BP 347 425 956 387 564 332 951  Diastolic BP 84 79 84 82 82 67 98  Wt. (Lbs) 178 182 182.6 182 182 182 184.2  BMI 27.88 28.51 28.6 28.51 28.51 28.51 28.85       GERD (gastroesophageal reflux disease) Controlled, no change in medication   Overweight (BMI 25.0-29.9)  Patient re-educated about  the importance of commitment to a  minimum of 150 minutes of exercise per week as able.  The importance of healthy food choices with portion control discussed, as well as eating regularly and within a 12 hour window most days. The need to choose "clean , green" food 50 to 75% of the time is discussed, as well as to make water the primary drink and set a goal of 64 ounces water daily.    Weight /BMI 06/02/2020 04/14/2020 03/18/2020  WEIGHT 178 lb 182 lb 182 lb 9.6 oz  HEIGHT 5\' 7"  5\' 7"  5\' 7"   BMI 27.88 kg/m2 28.51 kg/m2 28.6 kg/m2

## 2020-06-28 ENCOUNTER — Other Ambulatory Visit: Payer: Self-pay | Admitting: Gastroenterology

## 2020-06-30 NOTE — Telephone Encounter (Signed)
Please verify whether patient is taking Nexium or Protonix.  At her last visit with Roseanne Kaufman, NP, she was taking Nexium 40 mg daily.  It looks like Protonix was added to her list when she saw Dr. Moshe Cipro in November.  Not sure if this was a mistake as it looks like the medication was just added to her list rather than sent to the pharmacy.

## 2020-07-01 NOTE — Telephone Encounter (Signed)
Phoned and LM on pts vm to call our office regarding her medication and that we close at 12 noon and phones turn over at 11:45 am or to call next week regular business hours.

## 2020-07-04 NOTE — Telephone Encounter (Signed)
Noted 2. Pt called and advise Rx called in.

## 2020-07-04 NOTE — Telephone Encounter (Signed)
See other note

## 2020-07-04 NOTE — Telephone Encounter (Signed)
I phoned and spoke with the pt and she is taking Protonix and [name] prescribed it because her name was on it. Dr. Moshe Cipro only did her blood pressure meds that day.

## 2020-07-04 NOTE — Telephone Encounter (Signed)
Noted. I will refill Protonix. Nexium will be discontinued.

## 2020-08-18 ENCOUNTER — Other Ambulatory Visit: Payer: Self-pay

## 2020-08-18 ENCOUNTER — Telehealth (INDEPENDENT_AMBULATORY_CARE_PROVIDER_SITE_OTHER): Payer: Medicare Other | Admitting: Family Medicine

## 2020-08-18 ENCOUNTER — Encounter: Payer: Self-pay | Admitting: Family Medicine

## 2020-08-18 VITALS — BP 119/92 | Wt 174.5 lb

## 2020-08-18 DIAGNOSIS — F17210 Nicotine dependence, cigarettes, uncomplicated: Secondary | ICD-10-CM

## 2020-08-18 DIAGNOSIS — J42 Unspecified chronic bronchitis: Secondary | ICD-10-CM

## 2020-08-18 DIAGNOSIS — F17200 Nicotine dependence, unspecified, uncomplicated: Secondary | ICD-10-CM | POA: Diagnosis not present

## 2020-08-18 DIAGNOSIS — J329 Chronic sinusitis, unspecified: Secondary | ICD-10-CM

## 2020-08-18 DIAGNOSIS — I10 Essential (primary) hypertension: Secondary | ICD-10-CM | POA: Diagnosis not present

## 2020-08-18 DIAGNOSIS — Z13220 Encounter for screening for lipoid disorders: Secondary | ICD-10-CM

## 2020-08-18 DIAGNOSIS — E663 Overweight: Secondary | ICD-10-CM

## 2020-08-18 MED ORDER — AZITHROMYCIN 250 MG PO TABS
ORAL_TABLET | ORAL | 0 refills | Status: DC
Start: 2020-08-18 — End: 2020-09-13

## 2020-08-18 NOTE — Patient Instructions (Addendum)
Annual physical exam in office with re evaluation of blood pressure in 5 months  Z pack prescribed for sinus infection  Fasting lipid and chem  And EGFR 1 week before June visit  Please call in your blood pressure reading when you check it today    QuiT DATE is Aug 23, 2020! The lozengers will help you to remain nicotine free, so go for it!  It is important that you exercise regularly at least 30 minutes 5 times a week. If you develop chest pain, have severe difficulty breathing, or feel very tired, stop exercising immediately and seek medical attention  Think about what you will eat, plan ahead. Choose " clean, green, fresh or frozen" over canned, processed or packaged foods which are more sugary, salty and fatty. 70 to 75% of food eaten should be vegetables and fruit. Three meals at set times with snacks allowed between meals, but they must be fruit or vegetables. Aim to eat over a 12 hour period , example 7 am to 7 pm, and STOP after  your last meal of the day. Drink water,generally about 64 ounces per day, no other drink is as healthy. Fruit juice is best enjoyed in a healthy way, by EATING the fruit. Thanks for choosing [facility], we consider it a privelige to serve you.

## 2020-08-18 NOTE — Assessment & Plan Note (Addendum)
DASH diet and commitment to daily physical activity for a minimum of 30 minutes discussed and encouraged, as a part of hypertension management. The importance of attaining a healthy weight is also discussed. Uncontrolled, needs in office viist  BP/Weight 06/02/2020 04/14/2020 03/18/2020 03/01/2020 02/11/2020 9/38/1017 11/20/256  Systolic BP 527 782 423 536 144 315 400  Diastolic BP 84 79 84 82 82 67 98  Wt. (Lbs) 178 182 182.6 182 182 182 184.2  BMI 27.88 28.51 28.6 28.51 28.51 28.51 28.85   needs ov blood pressure check

## 2020-08-18 NOTE — Assessment & Plan Note (Signed)
Worsening due to ongoing nicotine use , counseled and encouraged to quit

## 2020-08-18 NOTE — Assessment & Plan Note (Signed)
Asked:confirms currently smokes cigarettes currently smokes 3/day, has lozengers 2 mg nicorette  Assess:willing to set a quit date,Feb 1, and  is cutting back Advise: needs to QUIT to reduce risk of cancer, cardio and cerebrovascular disease Assist: counseled for 5 minutes and literature provided Arrange: follow up in 2 to 4 months

## 2020-08-18 NOTE — Assessment & Plan Note (Signed)
Currently symptomatic, azithromycin prescribed x 5 days

## 2020-08-18 NOTE — Progress Notes (Signed)
Virtual Visit via Telephone Note  I connected with [name] on 08/18/20 at 11:00 AM EST by telephone and verified that I am speaking with the correct person using two identifiers.  Location: Patient: home Provider: work   I discussed the limitations, risks, security and privacy concerns of performing an evaluation and management service by telephone and the availability of in person appointments. I also discussed with the patient that there may be a patient responsible charge related to this service. The patient expressed understanding and agreed to proceed.   History of Present Illness: Sinus pressure and yellow drainage fronm 07/15/2021, no chills or fever Denies productive cough cough Denies recent fever or chills. Denies chest pains, palpitations and leg swelling Denies abdominal pain, nausea, vomiting,diarrhea or constipation.   Denies dysuria, frequency, hesitancy  Denies uncontrolled  joint pain, swelling and limitation in mobility. Denies headaches, seizures, numbness, or tingling. Denies depression,uncontrolled anxiety or insomnia. Denies skin break down or rash.        Observations/Objective: There were no vitals taken for this visit. Good communication with no confusion and intact memory. Alert and oriented x 3 No signs of respiratory distress during speech    Assessment and Plan: Recurrent sinusitis Currently symptomatic, azithromycin prescribed x 5 days  NICOTINE ADDICTION Asked:confirms currently smokes cigarettes currently smokes 3/day, has lozengers 2 mg nicorette  Assess:willing to set a quit date,Feb 1, and  is cutting back Advise: needs to QUIT to reduce risk of cancer, cardio and cerebrovascular disease Assist: counseled for 5 minutes and literature provided Arrange: follow up in 2 to 4 months   Essential hypertension DASH diet and commitment to daily physical activity for a minimum of 30 minutes discussed and encouraged, as a part of  hypertension management. The importance of attaining a healthy weight is also discussed. Uncontrolled, needs in office viist  BP/Weight 06/02/2020 04/14/2020 03/18/2020 03/01/2020 02/11/2020 9/38/1017 11/20/256  Systolic BP 527 782 423 536 144 315 400  Diastolic BP 84 79 84 82 82 67 98  Wt. (Lbs) 178 182 182.6 182 182 182 184.2  BMI 27.88 28.51 28.6 28.51 28.51 28.51 28.85   needs ov blood pressure check     COPD (chronic obstructive pulmonary disease) Worsening due to ongoing nicotine use , counseled and encouraged to quit  Overweight (BMI 25.0-29.9)  Patient re-educated about  the importance of commitment to a  minimum of 150 minutes of exercise per week as able.  The importance of healthy food choices with portion control discussed, as well as eating regularly and within a 12 hour window most days. The need to choose "clean , green" food 50 to 75% of the time is discussed, as well as to make water the primary drink and set a goal of 64 ounces water daily.    Weight /BMI 06/02/2020 04/14/2020 03/18/2020  WEIGHT 178 lb 182 lb 182 lb 9.6 oz  HEIGHT 5\' 7"  5\' 7"  5\' 7"   BMI 27.88 kg/m2 28.51 kg/m2 28.6 kg/m2        Follow Up Instructions:    I discussed the assessment and treatment plan with the patient. The patient was provided an opportunity to ask questions and all were answered. The patient agreed with the plan and demonstrated an understanding of the instructions.   The patient was advised to call back or seek an in-person evaluation if the symptoms worsen or if the condition fails to improve as anticipated.  I provided 21 minutes of non-face-to-face time during this encounter.  [name], MD

## 2020-08-22 ENCOUNTER — Encounter: Payer: Self-pay | Admitting: Family Medicine

## 2020-08-22 NOTE — Assessment & Plan Note (Signed)
  Patient re-educated about  the importance of commitment to a  minimum of 150 minutes of exercise per week as able.  The importance of healthy food choices with portion control discussed, as well as eating regularly and within a 12 hour window most days. The need to choose "clean , green" food 50 to 75% of the time is discussed, as well as to make water the primary drink and set a goal of 64 ounces water daily.    Weight /BMI 06/02/2020 04/14/2020 03/18/2020  WEIGHT 178 lb 182 lb 182 lb 9.6 oz  HEIGHT 5' 7" 5' 7" 5' 7"  BMI 27.88 kg/m2 28.51 kg/m2 28.6 kg/m2     

## 2020-08-23 ENCOUNTER — Telehealth: Payer: Self-pay

## 2020-08-23 NOTE — Telephone Encounter (Signed)
Called back and said her weight was 174.5 and bp was 119/92

## 2020-08-24 NOTE — Telephone Encounter (Signed)
I see this is already entered, great , thanks

## 2020-08-24 NOTE — Telephone Encounter (Signed)
I am entering in the note , but in the future if pts call with this additional info please just addend the note , thanks!

## 2020-09-13 ENCOUNTER — Other Ambulatory Visit: Payer: Self-pay

## 2020-09-13 ENCOUNTER — Ambulatory Visit: Payer: Medicare Other | Admitting: Gastroenterology

## 2020-09-13 ENCOUNTER — Encounter: Payer: Self-pay | Admitting: Gastroenterology

## 2020-09-13 VITALS — BP 162/99 | HR 87 | Temp 96.9°F | Ht 67.0 in | Wt 180.2 lb

## 2020-09-13 DIAGNOSIS — K5900 Constipation, unspecified: Secondary | ICD-10-CM | POA: Diagnosis not present

## 2020-09-13 DIAGNOSIS — K219 Gastro-esophageal reflux disease without esophagitis: Secondary | ICD-10-CM | POA: Diagnosis not present

## 2020-09-13 NOTE — Progress Notes (Signed)
Cc'ed to pcp °

## 2020-09-13 NOTE — Patient Instructions (Addendum)
Let's decrease pantoprazole (Protonix) to once per day, 30 minutes before breakfast. Let me know if this does not help your reflux  I have given samples of Linzess to take. It is ok to take aloe vera if this helps you.  Please keep monitoring your blood pressure and call Dr. Moshe Cipro if it remains elevated.   We will see you in 3 months!   I enjoyed seeing you again today! As you know, I value our relationship and want to provide genuine, compassionate, and quality care. I welcome your feedback. If you receive a survey regarding your visit,  I greatly appreciate you taking time to fill this out. See you next time!  Annitta Needs, PhD, ANP-BC [facility]

## 2020-09-13 NOTE — Progress Notes (Signed)
Referring Provider: Fayrene Helper, MD Primary Care Physician:  Fayrene Helper, MD Primary GI: Dr. Abbey Chatters  Chief Complaint  Patient presents with  . Constipation    Last bm 09/11/20  . elevated alkaline phosphatase    F/u    HPI:   [name] is a 80 y.o. female presenting today with a history of PUD in remote past, GERD, constipation, with next colonoscopy in 2024 if benefits outweigh the risks. Isolated mildly elevated alk phos noted since 2019. Fatty liver on CT recently. No elevated transaminases. Recent EGD with empiric dilation in July 2021. Recent HFP Sept 2021 with normal alk phos. Hep C antibody negative.   Linzess expensive (45$) but covered by insurance. If takes aloe vera, will have decent results. Had held off on this due to price but now has a coupon.   Metamucil: used to take 5 capsules. Not taking this any longer. Was not helpful.   Last cigarette was 1/27.     Past Medical History:  Diagnosis Date  . COPD (chronic obstructive pulmonary disease) ([place])   . Hiatal hernia without gangrene and obstruction 03/23/2015  . Hypertension 2004  . Lung nodule seen on imaging study   . Nicotine addiction   . OA (osteoarthritis) of knee   . Obesity   . Sinusitis     Past Surgical History:  Procedure Laterality Date  . bilateral cataract surgery  7/27 & 03/01/09   Dr. Gershon Crane    . cholecystectomy    . COLONOSCOPY  04/2004   Dr. Leane Para Smith-->melanosis coli  . COLONOSCOPY WITH ESOPHAGOGASTRODUODENOSCOPY (EGD) N/A 02/25/2013   Dr. Fields:Normal mucosa in the terminal ileum/Severe melanosis throughout the entire examined colon/ONE COLON POLYP REMOVED (tubular adenoma)/Mild diverticulosis  in the sigmoid colon/EGD:The mucosa of the esophagus appeared normal/Non-erosive gastritis, empiric dilation with Savary dilator  . ESOPHAGOGASTRODUODENOSCOPY  11/07/2010   Jenkins:hiatal hernia/no evidence of Barrett esophagitis.  The Z-line was noted to be at 39 cm from  the teeth. CLO test negative  . ESOPHAGOGASTRODUODENOSCOPY N/A 09/11/2013   Dr.Fields-  incomplete esophageal web in mid-esophagus, medium sized hialtal hernia, PUD. bx=focal erosion with inflammation and fibrosis  . ESOPHAGOGASTRODUODENOSCOPY N/A 07/06/2014   healed ulcers, persistent erosions on aspirin, small hiatal hernia  . ESOPHAGOGASTRODUODENOSCOPY N/A 02/05/2020   normal esophagus s/p dilation, normal stomach, normal duodenal bulb.   Marland Kitchen knee arthroscopy right    . LEFT HEART CATHETERIZATION WITH CORONARY ANGIOGRAM N/A 04/08/2014   Procedure: LEFT HEART CATHETERIZATION WITH CORONARY ANGIOGRAM;  Surgeon: Sinclair Grooms, MD;  Location: [facility] CATH LAB;  Service: Cardiovascular;  Laterality: N/A;  . MALONEY DILATION N/A 02/25/2013   Procedure: Venia Minks DILATION;  Surgeon: Danie Binder, MD;  [facility];  Service: Endoscopy;  Laterality: N/A;  . PARTIAL HYSTERECTOMY    . SAVORY DILATION N/A 02/25/2013   Procedure: SAVORY DILATION;  Surgeon: Danie Binder, MD;  Location: [facility];  Service: Endoscopy;  Laterality: N/A;  . total knee arthroplasty right  05/29/2005   Dr. Aline Brochure     Current Outpatient Medications  Medication Sig Dispense Refill  . ALOE VERA PO Take 500 mg by mouth daily as needed (stomach).     Marland Kitchen amLODipine (NORVASC) 10 MG tablet TAKE 1 TABLET(10 MG) BY MOUTH DAILY 30 tablet 5  . aspirin EC 81 MG tablet Take 81 mg by mouth daily.    . Calcium Carb-Cholecalciferol (CALCIUM 600/VITAMIN D3) 600-800 MG-UNIT TABS Take 1 tablet by mouth daily.    Marland Kitchen  fluticasone (FLONASE) 50 MCG/ACT nasal spray Place 2 sprays into both nostrils daily as needed for allergies or rhinitis. 16 g 2  . Magnesium Hydroxide (MILK OF MAGNESIA PO) Take by mouth as needed. 1 capful as needed    . montelukast (SINGULAIR) 10 MG tablet Take 10 mg by mouth daily.    . Multiple Vitamin (MULTIVITAMIN) tablet Take 2 tablets by mouth daily.     . Omega 3 1000 MG CAPS Take 1 capsule by mouth daily.    Marland Kitchen  OVER THE COUNTER MEDICATION Beet powder at night    . pantoprazole (PROTONIX) 40 MG tablet TAKE 1 TABLET(40 MG) BY MOUTH TWICE DAILY BEFORE A MEAL 60 tablet 3  . Psyllium (METAMUCIL) 0.36 g CAPS Take 5 capsules by mouth as needed.    . triamterene-hydrochlorothiazide (MAXZIDE) 75-50 MG tablet Take one tablet by mouth once daily every morning at 9 am 30 tablet 3  . azithromycin (ZITHROMAX) 250 MG tablet Take two tablets on day one, then take one tablet once daily for an additional four days (Patient not taking: Reported on 09/13/2020) 6 tablet 0   No current facility-administered medications for this visit.    Allergies as of 09/13/2020  . (No Known Allergies)    Family History  Problem Relation Age of Onset  . Diabetes Mother   . Hypertension Mother   . Coronary artery disease Mother   . Pneumonia Father   . Hypertension Sister   . Hypertension Sister   . Hypertension Brother        Psychologist, forensic  . Diabetes Brother   . Hypertension Brother   . Colon cancer Neg Hx   . Liver disease Neg Hx     Social History   Socioeconomic History  . Marital status: Widowed    Spouse name: Not on file  . Number of children: 4  . Years of education: Not on file  . Highest education level: Not on file  Occupational History  . Occupation: employed     Employer: RETIRED  Tobacco Use  . Smoking status: Current Some Day Smoker    Packs/day: 0.10    Years: 10.00    Pack years: 1.00    Types: Cigarettes    Start date: 07/23/1962  . Smokeless tobacco: Never Used  . Tobacco comment: 1 per day   Vaping Use  . Vaping Use: Never used  Substance and Sexual Activity  . Alcohol use: No    Alcohol/week: 0.0 standard drinks  . Drug use: No  . Sexual activity: Never  Other Topics Concern  . Not on file  Social History Narrative  . Not on file   Social Determinants of Health   Financial Resource Strain: Low Risk   . Difficulty of Paying Living Expenses: Not hard at all  Food Insecurity: No Food  Insecurity  . Worried About Charity fundraiser in the Last Year: Never true  . Ran Out of Food in the Last Year: Never true  Transportation Needs: No Transportation Needs  . Lack of Transportation (Medical): No  . Lack of Transportation (Non-Medical): No  Physical Activity: Insufficiently Active  . Days of Exercise per Week: 3 days  . Minutes of Exercise per Session: 30 min  Stress: No Stress Concern Present  . Feeling of Stress : Not at all  Social Connections: Moderately Isolated  . Frequency of Communication with Friends and Family: More than three times a week  . Frequency of Social Gatherings with Friends and  Family: More than three times a week  . Attends Religious Services: More than 4 times per year  . Active Member of Clubs or Organizations: No  . Attends Archivist Meetings: Never  . Marital Status: Widowed    Review of Systems: Gen: Denies fever, chills, anorexia. Denies fatigue, weakness, weight loss.  CV: Denies chest pain, palpitations, syncope, peripheral edema, and claudication. Resp: Denies dyspnea at rest, cough, wheezing, coughing up blood, and pleurisy. GI: see HPI Derm: Denies rash, itching, dry skin Psych: Denies depression, anxiety, memory loss, confusion. No homicidal or suicidal ideation.  Heme: Denies bruising, bleeding, and enlarged lymph nodes.  Physical Exam: BP (!) 188/99 repeat 162/99   Pulse 87   Temp (!) 96.9 F (36.1 C) (Temporal)   Ht _0  (1.702 m)   Wt 180 lb 3.2 oz (81.7 kg)   BMI 28.22 kg/m  General:   Alert and oriented. No distress noted. Pleasant and cooperative.  Head:  Normocephalic and atraumatic. Eyes:  Conjuctiva clear without scleral icterus. Mouth:  Mask in place Abdomen:  +BS, soft, non-tender and non-distended. No rebound or guarding. No HSM or masses noted. Msk:  Symmetrical without gross deformities. Normal posture. Extremities:  Without edema. Neurologic:  Alert and  oriented x4 Psych:  Alert and  cooperative. Normal mood and affect.  ASSESSMENT: [name] is a 80 y.o. female presenting today with a history of PUD in remote past, GERD, constipation, with next colonoscopy in 2024 if benefits outweigh the risks. Isolated mildly elevated alk phos noted since 2019, fatty liver, here for routine follow-up.  GERD: well-controlled on BID PPI. I have asked that she decrease this to once daily if tolerated.   Elevated alk phos: most recent HFP normal. Will follow serially.   Constipation: improved with aloe vera. I have provided samples again of Linzess 290 mcg daily. She will be checking with her insurance regarding coverage.     PLAN:  Decrease Protonix to once daily if tolerated Linzess 290 mcg samples Colonoscopy in 2024 Serial HFP Return in 3 months   Annitta Needs, PhD, [facility] Gastroenterology

## 2020-09-28 ENCOUNTER — Other Ambulatory Visit: Payer: Self-pay | Admitting: Gastroenterology

## 2020-10-12 ENCOUNTER — Encounter: Payer: Medicare Other | Admitting: Internal Medicine

## 2020-10-12 ENCOUNTER — Other Ambulatory Visit: Payer: Self-pay

## 2020-10-12 ENCOUNTER — Encounter: Payer: Self-pay | Admitting: Internal Medicine

## 2020-10-12 ENCOUNTER — Other Ambulatory Visit: Payer: Self-pay | Admitting: *Deleted

## 2020-10-12 MED ORDER — FLUTICASONE PROPIONATE 50 MCG/ACT NA SUSP: 2.0000 | Freq: Every day | NASAL | 2 refills | Status: DC | PRN

## 2020-10-13 NOTE — Progress Notes (Signed)
Erroneous entry

## 2020-10-14 ENCOUNTER — Encounter: Payer: Self-pay | Admitting: Emergency Medicine

## 2020-10-14 ENCOUNTER — Ambulatory Visit
Admission: EM | Admit: 2020-10-14 | Discharge: 2020-10-14 | Disposition: A | Discharge status: Home / Self Care | Payer: Medicare Other | Attending: Internal Medicine | Admitting: Internal Medicine

## 2020-10-14 ENCOUNTER — Other Ambulatory Visit: Payer: Self-pay

## 2020-10-14 DIAGNOSIS — J019 Acute sinusitis, unspecified: Secondary | ICD-10-CM

## 2020-10-14 MED ORDER — AMOXICILLIN-POT CLAVULANATE 875-125 MG PO TABS
1.0000 | ORAL_TABLET | Freq: Two times a day (BID) | ORAL | 0 refills | Status: DC
Start: 2020-10-14 — End: 2020-12-14

## 2020-10-14 MED ORDER — GUAIFENESIN ER 600 MG PO TB12
600.0000 mg | ORAL_TABLET | Freq: Two times a day (BID) | ORAL | 0 refills | Status: AC
Start: 2020-10-14 — End: 2020-10-28

## 2020-10-14 NOTE — Discharge Instructions (Signed)
Take medications as prescribed If you have worsening symptoms please return to urgent care to be reevaluated

## 2020-10-14 NOTE — ED Triage Notes (Signed)
States she has been having a bad sinus infection since 3/7.  Nasal congestion is yellow and coughing up yellow sputum.  States it has a terrible odor .

## 2020-10-17 NOTE — ED Provider Notes (Signed)
[facility]    CSN: [id number] Arrival date & time: 10/14/20  1257      History   Chief Complaint No chief complaint on file.   HPI [name] is a 80 y.o. female comes to the urgent care with a 3-week history of nasal congestion and pain over the face as well as yellowish-green nasal discharge.  Symptoms started 3 weeks ago and has been persistent.  Patient says the nasal discharge has increased as well as thickened.  She denies any fever or chills.  She endorses a headache.  She also endorses postnasal drip productive of yellowish sputum.  Sputum is malodorous.  No shortness of breath or wheezing.  No blurry vision or double vision.  Patient has tried over-the-counter medications with no improvement in symptoms.  HPI  Past Medical History:  Diagnosis Date  . COPD (chronic obstructive pulmonary disease) ([place])   . Hiatal hernia without gangrene and obstruction 03/23/2015  . Hypertension 2004  . Lung nodule seen on imaging study   . Nicotine addiction   . OA (osteoarthritis) of knee   . Obesity   . Sinusitis     Patient Active Problem List   Diagnosis Date Noted  . Dyspepsia 11/24/2019  . Elevated alkaline phosphatase level 11/24/2019  . COVID-19 virus infection 08/21/2019  . Seasonal allergies 05/06/2017  . Bloating symptom 03/15/2016  . Recurrent sinusitis 01/05/2015  . CAD (coronary artery disease), native coronary artery 05/06/2014  . Gastric ulcer 03/23/2014  . Lung nodule seen on imaging study 02/16/2014  . Metabolic syndrome X 41/28/7867  . OA (osteoarthritis) of knee 05/20/2013  . Constipation 02/12/2013  . GERD (gastroesophageal reflux disease) 02/12/2013  . Overweight (BMI 25.0-29.9) 02/12/2013  . COPD (chronic obstructive pulmonary disease) ([place]) 08/25/2011  . Vitamin D deficiency 11/23/2010  . BACK PAIN WITH RADICULOPATHY 08/22/2009  . Prediabetes 05/31/2008  . NICOTINE ADDICTION 10/30/2007  . Essential hypertension 10/30/2007    Past  Surgical History:  Procedure Laterality Date  . bilateral cataract surgery  7/27 & 03/01/09   Dr. Gershon Crane    . cholecystectomy    . COLONOSCOPY  04/2004   Dr. Leane Para Smith-->melanosis coli  . COLONOSCOPY WITH ESOPHAGOGASTRODUODENOSCOPY (EGD) N/A 02/25/2013   Dr. Fields:Normal mucosa in the terminal ileum/Severe melanosis throughout the entire examined colon/ONE COLON POLYP REMOVED (tubular adenoma)/Mild diverticulosis  in the sigmoid colon/EGD:The mucosa of the esophagus appeared normal/Non-erosive gastritis, empiric dilation with Savary dilator  . ESOPHAGOGASTRODUODENOSCOPY  11/07/2010   Jenkins:hiatal hernia/no evidence of Barrett esophagitis.  The Z-line was noted to be at 39 cm from the teeth. CLO test negative  . ESOPHAGOGASTRODUODENOSCOPY N/A 09/11/2013   Dr.Fields-  incomplete esophageal web in mid-esophagus, medium sized hialtal hernia, PUD. bx=focal erosion with inflammation and fibrosis  . ESOPHAGOGASTRODUODENOSCOPY N/A 07/06/2014   healed ulcers, persistent erosions on aspirin, small hiatal hernia  . ESOPHAGOGASTRODUODENOSCOPY N/A 02/05/2020   normal esophagus s/p dilation, normal stomach, normal duodenal bulb.   Marland Kitchen knee arthroscopy right    . LEFT HEART CATHETERIZATION WITH CORONARY ANGIOGRAM N/A 04/08/2014   Procedure: LEFT HEART CATHETERIZATION WITH CORONARY ANGIOGRAM;  Surgeon: Sinclair Grooms, MD;  [facility] CATH LAB;  Service: Cardiovascular;  Laterality: N/A;  . MALONEY DILATION N/A 02/25/2013   Procedure: Venia Minks DILATION;  Surgeon: Danie Binder, MD;  [facility];  Service: Endoscopy;  Laterality: N/A;  . PARTIAL HYSTERECTOMY    . SAVORY DILATION N/A 02/25/2013   Procedure: SAVORY DILATION;  Surgeon: Danie Binder,  MD;  [facility];  Service: Endoscopy;  Laterality: N/A;  . total knee arthroplasty right  05/29/2005   Dr. Aline Brochure     OB History    Gravida  4   Para  4   Term  4   Preterm      AB      Living  4     SAB      IAB       Ectopic      Multiple      Live Births               Home Medications    Prior to Admission medications   Medication Sig Start Date End Date Taking? Authorizing Provider  amoxicillin-clavulanate (AUGMENTIN) 875-125 MG tablet Take 1 tablet by mouth every 12 (twelve) hours. 10/14/20  Yes Kain Milosevic, Myrene Galas, MD  guaiFENesin (MUCINEX) 600 MG 12 hr tablet Take 1 tablet (600 mg total) by mouth 2 (two) times daily for 14 days. 10/14/20 10/28/20 Yes Kimothy Kishimoto, Myrene Galas, MD  ALOE VERA PO Take 500 mg by mouth daily as needed (stomach).     [provider]  amLODipine (NORVASC) 10 MG tablet TAKE 1 TABLET(10 MG) BY MOUTH DAILY 04/14/20   Fayrene Helper, MD  aspirin EC 81 MG tablet Take 81 mg by mouth daily.    [provider]  Calcium Carb-Cholecalciferol (CALCIUM 600/VITAMIN D3) 600-800 MG-UNIT TABS Take 1 tablet by mouth daily.    [provider]  fluticasone (FLONASE) 50 MCG/ACT nasal spray Place 2 sprays into both nostrils daily as needed for allergies or rhinitis. 10/12/20   Lindell Spar, MD  Magnesium Hydroxide (MILK OF MAGNESIA PO) Take by mouth as needed. 1 capful as needed    [provider]  montelukast (SINGULAIR) 10 MG tablet Take 10 mg by mouth daily. 03/08/20   [provider]  Multiple Vitamin (MULTIVITAMIN) tablet Take 2 tablets by mouth daily.     [provider]  Omega 3 1000 MG CAPS Take 1 capsule by mouth daily.    [provider]  OVER THE COUNTER MEDICATION Beet powder at night    [provider]  pantoprazole (PROTONIX) 40 MG tablet TAKE 1 TABLET(40 MG) BY MOUTH TWICE DAILY BEFORE A MEAL 09/28/20   Annitta Needs, NP  pantoprazole (PROTONIX) 40 MG tablet TAKE 1 TABLET(40 MG) BY MOUTH TWICE DAILY BEFORE A MEAL 09/28/20   Annitta Needs, NP  triamterene-hydrochlorothiazide [facility]) 75-50 MG tablet Take one tablet by mouth once daily every morning at 9 am 06/02/20   Fayrene Helper, MD    Family  History Family History  Problem Relation Age of Onset  . Diabetes Mother   . Hypertension Mother   . Coronary artery disease Mother   . Pneumonia Father   . Hypertension Sister   . Hypertension Sister   . Hypertension Brother        Psychologist, forensic  . Diabetes Brother   . Hypertension Brother   . Colon cancer Neg Hx   . Liver disease Neg Hx     Social History Social History   Tobacco Use  . Smoking status: Current Some Day Smoker    Packs/day: 0.10    Years: 10.00    Pack years: 1.00    Types: Cigarettes    Start date: 07/23/1962  . Smokeless tobacco: Never Used  . Tobacco comment: 1 per day   Vaping Use  . Vaping Use:  Never used  Substance Use Topics  . Alcohol use: No    Alcohol/week: 0.0 standard drinks  . Drug use: No     Allergies   Patient has no known allergies.   Review of Systems Review of Systems  Constitutional: Negative.   HENT: Positive for congestion, sinus pressure and sinus pain. Negative for sneezing, sore throat and tinnitus.   Respiratory: Negative for cough, chest tightness and shortness of breath.   Genitourinary: Negative.   Neurological: Positive for headaches. Negative for dizziness and light-headedness.     Physical Exam Triage Vital Signs ED Triage Vitals  Enc Vitals Group     BP 10/14/20 1305 (!) 206/108     Pulse Rate 10/14/20 1305 (!) 101     Resp 10/14/20 1305 18     Temp 10/14/20 1305 98.1 F (36.7 C)     Temp Source 10/14/20 1305 Oral     SpO2 10/14/20 1305 94 %     Weight --      Height --      Head Circumference --      Peak Flow --      Pain Score 10/14/20 1309 5     Pain Loc --      Pain Edu? --      Excl. in [place]? --    No data found.  Updated Vital Signs BP (!) 201/95 (BP Location: Right Arm)   Pulse (!) 101   Temp 98.1 F (36.7 C) (Oral)   Resp 18   SpO2 94%   Visual Acuity Right Eye Distance:   Left Eye Distance:   Bilateral Distance:    Right Eye Near:   Left Eye Near:    Bilateral Near:      Physical Exam Vitals and nursing note reviewed.  Constitutional:      General: She is not in acute distress.    Appearance: She is not ill-appearing.  HENT:     Right Ear: Tympanic membrane normal.     Left Ear: Tympanic membrane normal.     Nose: Nose normal. No congestion or rhinorrhea.     Mouth/Throat:     Mouth: Mucous membranes are moist.     Pharynx: Posterior oropharyngeal erythema present.  Cardiovascular:     Rate and Rhythm: Normal rate and regular rhythm.     Pulses: Normal pulses.     Heart sounds: Normal heart sounds.  Pulmonary:     Effort: Pulmonary effort is normal.     Breath sounds: Normal breath sounds.  Abdominal:     General: Bowel sounds are normal.     Palpations: Abdomen is soft.  Musculoskeletal:     Cervical back: Normal range of motion.  Neurological:     Mental Status: She is alert.      UC Treatments / Results  Labs (all labs ordered are listed, but only abnormal results are displayed) Labs Reviewed - No data to display  EKG   Radiology No results found.  Procedures Procedures (including critical care time)  Medications Ordered in UC Medications - No data to display  Initial Impression / Assessment and Plan / UC Course  I have reviewed the triage vital signs and the nursing notes.  Pertinent labs & imaging results that were available during my care of the patient were reviewed by me and considered in my medical decision making (see chart for details).     1.  Acute sinusitis with symptoms greater than 10 days: Augmentin 1 tablet  twice daily for 7 days Mucinex 600 mg twice daily Saline nasal wash/spray as recommended If symptoms worsen patient is advised to return to the urgent care.  Her pulmonary exam is unremarkable. Final Clinical Impressions(s) / UC Diagnoses   Final diagnoses:  Acute sinusitis with symptoms > 10 days     Discharge Instructions     Take medications as prescribed If you have worsening symptoms  please return to urgent care to be reevaluated    ED Prescriptions    Medication Sig Dispense Auth. Provider   amoxicillin-clavulanate (AUGMENTIN) 875-125 MG tablet Take 1 tablet by mouth every 12 (twelve) hours. 14 tablet Dinora Hemm, Myrene Galas, MD   guaiFENesin (MUCINEX) 600 MG 12 hr tablet Take 1 tablet (600 mg total) by mouth 2 (two) times daily for 14 days. 28 tablet Zaira Iacovelli, Myrene Galas, MD     PDMP not reviewed this encounter.   Chase Picket, MD 10/17/20 [phone]

## 2020-12-14 ENCOUNTER — Encounter: Payer: Self-pay | Admitting: Gastroenterology

## 2020-12-14 ENCOUNTER — Ambulatory Visit: Payer: Medicare Other | Admitting: Gastroenterology

## 2020-12-14 VITALS — BP 152/80 | HR 92 | Temp 97.3°F | Ht 67.0 in | Wt 180.8 lb

## 2020-12-14 DIAGNOSIS — K5900 Constipation, unspecified: Secondary | ICD-10-CM

## 2020-12-14 DIAGNOSIS — K219 Gastro-esophageal reflux disease without esophagitis: Secondary | ICD-10-CM | POA: Diagnosis not present

## 2020-12-14 NOTE — Progress Notes (Signed)
Referring Provider: Fayrene Helper, MD Primary Care Physician:  Fayrene Helper, MD Primary GI: Dr. Abbey Chatters  Chief Complaint  Patient presents with  . Gastroesophageal Reflux    Doing ok  . Constipation    Doing ok. Started back taking aloe    HPI:   [name] is a 80 y.o. female presenting today with a history of PUD in remote past, GERD, constipation, with next colonoscopy in 2024 if benefits outweigh the risks.Isolated mildly elevated alk phos noted since 2019. Fatty liver on CT recently. No elevated transaminases. Recent EGD with empiric dilation in July 2021. Recent HFP Sept 2021 with normal alk phos. Hep C antibody negative. Here for follow-up.  At last visit, recommended decreasing PPI to once daily. GERD well-controlled with this. No dysphagia. No abdominal pain, N/V. No overt GI bleeding.   Linzess 290 mcg samples had been provided. Worked well but 45$ a bottle. Fiber made her feel bloated. Drank V8 beet juice and had urgency. No rectal bleeding. Likes to take beet juice as needed and aloe.   Past Medical History:  Diagnosis Date  . COPD (chronic obstructive pulmonary disease) ([place])   . Hiatal hernia without gangrene and obstruction 03/23/2015  . Hypertension 2004  . Lung nodule seen on imaging study   . Nicotine addiction   . OA (osteoarthritis) of knee   . Obesity   . Sinusitis     Past Surgical History:  Procedure Laterality Date  . bilateral cataract surgery  7/27 & 03/01/09   Dr. Gershon Crane    . cholecystectomy    . COLONOSCOPY  04/2004   Dr. Leane Para Smith-->melanosis coli  . COLONOSCOPY WITH ESOPHAGOGASTRODUODENOSCOPY (EGD) N/A 02/25/2013   Dr. Fields:Normal mucosa in the terminal ileum/Severe melanosis throughout the entire examined colon/ONE COLON POLYP REMOVED (tubular adenoma)/Mild diverticulosis  in the sigmoid colon/EGD:The mucosa of the esophagus appeared normal/Non-erosive gastritis, empiric dilation with Savary dilator  .  ESOPHAGOGASTRODUODENOSCOPY  11/07/2010   Jenkins:hiatal hernia/no evidence of Barrett esophagitis.  The Z-line was noted to be at 39 cm from the teeth. CLO test negative  . ESOPHAGOGASTRODUODENOSCOPY N/A 09/11/2013   Dr.Fields-  incomplete esophageal web in mid-esophagus, medium sized hialtal hernia, PUD. bx=focal erosion with inflammation and fibrosis  . ESOPHAGOGASTRODUODENOSCOPY N/A 07/06/2014   healed ulcers, persistent erosions on aspirin, small hiatal hernia  . ESOPHAGOGASTRODUODENOSCOPY N/A 02/05/2020   normal esophagus s/p dilation, normal stomach, normal duodenal bulb.   Marland Kitchen knee arthroscopy right    . LEFT HEART CATHETERIZATION WITH CORONARY ANGIOGRAM N/A 04/08/2014   Procedure: LEFT HEART CATHETERIZATION WITH CORONARY ANGIOGRAM;  Surgeon: Sinclair Grooms, MD;  Location: [facility] CATH LAB;  Service: Cardiovascular;  Laterality: N/A;  . MALONEY DILATION N/A 02/25/2013   Procedure: Venia Minks DILATION;  Surgeon: Danie Binder, MD;  Location: [facility] ENDO SUITE;  Service: Endoscopy;  Laterality: N/A;  . PARTIAL HYSTERECTOMY    . SAVORY DILATION N/A 02/25/2013   Procedure: SAVORY DILATION;  Surgeon: Danie Binder, MD;  Location: [facility] ENDO SUITE;  Service: Endoscopy;  Laterality: N/A;  . total knee arthroplasty right  05/29/2005   Dr. Aline Brochure     Current Outpatient Medications  Medication Sig Dispense Refill  . ALOE VERA PO Take 500 mg by mouth daily as needed (stomach).     Marland Kitchen amLODipine (NORVASC) 10 MG tablet TAKE 1 TABLET(10 MG) BY MOUTH DAILY 30 tablet 5  . aspirin EC 81 MG tablet Take 81 mg by mouth daily.    Marland Kitchen  Calcium Carb-Cholecalciferol (CALCIUM 600/VITAMIN D3) 600-800 MG-UNIT TABS Take 1 tablet by mouth daily.    . fluticasone (FLONASE) 50 MCG/ACT nasal spray Place 2 sprays into both nostrils daily as needed for allergies or rhinitis. 16 g 2  . Magnesium Hydroxide (MILK OF MAGNESIA PO) Take by mouth as needed. 1 capful as needed    . montelukast (SINGULAIR) 10 MG tablet Take 10 mg by mouth daily.     . Multiple Vitamin (MULTIVITAMIN) tablet Take 2 tablets by mouth daily.     . Omega 3 1000 MG CAPS Take 1 capsule by mouth daily.    Marland Kitchen OVER THE COUNTER MEDICATION Beet powder at night    . pantoprazole (PROTONIX) 40 MG tablet TAKE 1 TABLET(40 MG) BY MOUTH TWICE DAILY BEFORE A MEAL (Patient taking differently: Take 40 mg by mouth daily.) 60 tablet 3  . triamterene-hydrochlorothiazide (MAXZIDE) 75-50 MG tablet Take one tablet by mouth once daily every morning at 9 am 30 tablet 3   No current facility-administered medications for this visit.    Allergies as of 12/14/2020  . (No Known Allergies)    Family History  Problem Relation Age of Onset  . Diabetes Mother   . Hypertension Mother   . Coronary artery disease Mother   . Pneumonia Father   . Hypertension Sister   . Hypertension Sister   . Hypertension Brother        Psychologist, forensic  . Diabetes Brother   . Hypertension Brother   . Colon cancer Neg Hx   . Liver disease Neg Hx     Social History   Socioeconomic History  . Marital status: Widowed    Spouse name: Not on file  . Number of children: 4  . Years of education: Not on file  . Highest education level: Not on file  Occupational History  . Occupation: employed     Employer: RETIRED  Tobacco Use  . Smoking status: Current Some Day Smoker    Packs/day: 0.10    Years: 10.00    Pack years: 1.00    Types: Cigarettes    Start date: 07/23/1962  . Smokeless tobacco: Never Used  . Tobacco comment: 1 per day   Vaping Use  . Vaping Use: Never used  Substance and Sexual Activity  . Alcohol use: No    Alcohol/week: 0.0 standard drinks  . Drug use: No  . Sexual activity: Never  Other Topics Concern  . Not on file  Social History Narrative  . Not on file   Social Determinants of Health   Financial Resource Strain: Low Risk   . Difficulty of Paying Living Expenses: Not hard at all  Food Insecurity: No Food Insecurity  . Worried About Charity fundraiser in the Last  Year: Never true  . Ran Out of Food in the Last Year: Never true  Transportation Hurley: No Transportation Hurley  . Lack of Transportation (Medical): No  . Lack of Transportation (Non-Medical): No  Physical Activity: Insufficiently Active  . Days of Exercise per Week: 3 days  . Minutes of Exercise per Session: 30 min  Stress: No Stress Concern Present  . Feeling of Stress : Not at all  Social Connections: Moderately Isolated  . Frequency of Communication with Friends and Family: More than three times a week  . Frequency of Social Gatherings with Friends and Family: More than three times a week  . Attends Religious Services: More than 4 times per year  . Active Member  of Clubs or Organizations: No  . Attends Archivist Meetings: Never  . Marital Status: Widowed    Review of Systems: Gen: Denies fever, chills, anorexia. Denies fatigue, weakness, weight loss.  CV: Denies chest pain, palpitations, syncope, peripheral edema, and claudication. Resp: Denies dyspnea at rest, cough, wheezing, coughing up blood, and pleurisy. GI: see HPI Derm: Denies rash, itching, dry skin Psych: Denies depression, anxiety, memory loss, confusion. No homicidal or suicidal ideation.  Heme: Denies bruising, bleeding, and enlarged lymph nodes.  Physical Exam: BP (!) 152/80 (BP Location: Right Arm, Patient Position: Sitting, Cuff Size: Normal)   Pulse 92   Temp (!) 97.3 F (36.3 C) (Temporal)   Ht $R'5\' 7"'Bm$  (1.702 m)   Wt 180 lb 12.8 oz (82 kg)   BMI 28.32 kg/m  General:   Alert and oriented. No distress noted. Pleasant and cooperative.  Head:  Normocephalic and atraumatic. Eyes:  Conjuctiva clear without scleral icterus. Mouth:  Mask in place Abdomen:  +BS, soft, diastasis recti vs ventral hernia,non-distended. No rebound or guarding. No HSM or masses noted. Msk:  Symmetrical without gross deformities. Normal posture. Extremities:  Without edema. Neurologic:  Alert and  oriented x4 Psych:  Alert  and cooperative. Normal mood and affect.  ASSESSMENT: BAMBIE PIZZOLATO is a 80 y.o. female presenting today with a history of PUD in remote past, GERD, constipation, fatty liver, mildly elevated alk phos in past but now normal, returning in follow-up.  GERD: well managed on once daily dosing PPI. No alarm signs/symptoms.  Constipation: managed with OTC agents. Linzess 45$ per bottle so will avoid this.    PLAN:  OTC measures for constipation Continue PPI daily Return in 1 year or sooner if needed Colonoscopy 2024  Laura Needs, PhD, [facility] Gastroenterology

## 2020-12-14 NOTE — Patient Instructions (Signed)
Let's try not to go more than one day without a bowel movement; if you miss a day, try the over-the-counter regimens you have.   Continue Protonix once daily, 30 minutes before breakfast.  We will see you in 1 year!  I enjoyed seeing you again today! As you know, I value our relationship and want to provide genuine, compassionate, and quality care. I welcome your feedback. If you receive a survey regarding your visit,  I greatly appreciate you taking time to fill this out. See you next time!  Annitta Needs, PhD, ANP-BC [facility] Gastroenterology

## 2020-12-14 NOTE — Progress Notes (Signed)
CC'ED TO PCP 

## 2020-12-27 ENCOUNTER — Other Ambulatory Visit: Payer: Self-pay | Admitting: Gastroenterology

## 2021-01-08 ENCOUNTER — Other Ambulatory Visit: Payer: Self-pay | Admitting: Internal Medicine

## 2021-01-19 ENCOUNTER — Ambulatory Visit (INDEPENDENT_AMBULATORY_CARE_PROVIDER_SITE_OTHER): Payer: Medicare Other | Admitting: Family Medicine

## 2021-01-19 ENCOUNTER — Encounter: Payer: Self-pay | Admitting: Family Medicine

## 2021-01-19 ENCOUNTER — Other Ambulatory Visit: Payer: Self-pay

## 2021-01-19 VITALS — BP 199/81 | HR 96 | Temp 98.2°F | Resp 16 | Ht 67.0 in | Wt 179.0 lb

## 2021-01-19 DIAGNOSIS — I10 Essential (primary) hypertension: Secondary | ICD-10-CM

## 2021-01-19 DIAGNOSIS — E8881 Metabolic syndrome: Secondary | ICD-10-CM

## 2021-01-19 DIAGNOSIS — F17200 Nicotine dependence, unspecified, uncomplicated: Secondary | ICD-10-CM | POA: Diagnosis not present

## 2021-01-19 DIAGNOSIS — E559 Vitamin D deficiency, unspecified: Secondary | ICD-10-CM | POA: Diagnosis not present

## 2021-01-19 DIAGNOSIS — R7301 Impaired fasting glucose: Secondary | ICD-10-CM

## 2021-01-19 DIAGNOSIS — Z0001 Encounter for general adult medical examination with abnormal findings: Secondary | ICD-10-CM

## 2021-01-19 DIAGNOSIS — E663 Overweight: Secondary | ICD-10-CM

## 2021-01-19 MED ORDER — TRIAMTERENE-HCTZ 75-50 MG PO TABS: ORAL_TABLET | ORAL | 0 refills | Status: DC

## 2021-01-19 NOTE — Patient Instructions (Addendum)
F/u in 3 weeks re evaluate blood pressure and EKG at visit  Labs today lipid, cmp and eGFR, TSH and CBC and vit D and hBA1C  Need to take triamterene every day at lunch, 1 pm for blood pressure 

## 2021-01-20 LAB — CBC
Hematocrit: 41.7 % (ref 34.0–46.6)
Hemoglobin: 14.2 g/dL (ref 11.1–15.9)
MCH: 29.6 pg (ref 26.6–33.0)
MCHC: 34.1 g/dL (ref 31.5–35.7)
MCV: 87 fL (ref 79–97)
Platelets: 311 10*3/uL (ref 150–450)
RBC: 4.79 x10E6/uL (ref 3.77–5.28)
RDW: 13.8 % (ref 11.7–15.4)
WBC: 7.5 10*3/uL (ref 3.4–10.8)

## 2021-01-20 LAB — CMP14+EGFR
ALT: 7 IU/L (ref 0–32)
AST: 16 IU/L (ref 0–40)
Albumin/Globulin Ratio: 1.1 — ABNORMAL LOW (ref 1.2–2.2)
Albumin: 4.1 g/dL (ref 3.7–4.7)
Alkaline Phosphatase: 114 IU/L (ref 44–121)
BUN/Creatinine Ratio: 10 — ABNORMAL LOW (ref 12–28)
BUN: 12 mg/dL (ref 8–27)
Bilirubin Total: 0.5 mg/dL (ref 0.0–1.2)
CO2: 25 mmol/L (ref 20–29)
Calcium: 9.4 mg/dL (ref 8.7–10.3)
Chloride: 98 mmol/L (ref 96–106)
Creatinine, Ser: 1.24 mg/dL — ABNORMAL HIGH (ref 0.57–1.00)
Globulin, Total: 3.6 g/dL (ref 1.5–4.5)
Glucose: 102 mg/dL — ABNORMAL HIGH (ref 65–99)
Potassium: 3.9 mmol/L (ref 3.5–5.2)
Sodium: 140 mmol/L (ref 134–144)
Total Protein: 7.7 g/dL (ref 6.0–8.5)
eGFR: 45 mL/min/{1.73_m2} — ABNORMAL LOW (ref >59)

## 2021-01-20 LAB — LIPID PANEL
Chol/HDL Ratio: 5.4 ratio — ABNORMAL HIGH (ref 0.0–4.4)
Cholesterol, Total: 259 mg/dL — ABNORMAL HIGH (ref 100–199)
HDL: 48 mg/dL (ref >39)
LDL Chol Calc (NIH): 178 mg/dL — ABNORMAL HIGH (ref 0–99)
Triglycerides: 177 mg/dL — ABNORMAL HIGH (ref 0–149)
VLDL Cholesterol Cal: 33 mg/dL (ref 5–40)

## 2021-01-20 LAB — HEMOGLOBIN A1C
Est. average glucose Bld gHb Est-mCnc: 126 mg/dL
Hgb A1c MFr Bld: 6 % — ABNORMAL HIGH (ref 4.8–5.6)

## 2021-01-20 LAB — TSH: TSH: 0.927 u[IU]/mL (ref 0.450–4.500)

## 2021-01-20 LAB — VITAMIN D 25 HYDROXY (VIT D DEFICIENCY, FRACTURES): Vit D, 25-Hydroxy: 27.9 ng/mL — ABNORMAL LOW (ref 30.0–100.0)

## 2021-01-23 ENCOUNTER — Encounter: Payer: Self-pay | Admitting: Family Medicine

## 2021-01-23 DIAGNOSIS — Z0001 Encounter for general adult medical examination with abnormal findings: Secondary | ICD-10-CM | POA: Insufficient documentation

## 2021-01-23 NOTE — Assessment & Plan Note (Signed)
Uncontrolled, takes meds inconsisently, will change this DASH diet and commitment to daily physical activity for a minimum of 30 minutes discussed and encouraged, as a part of hypertension management. The importance of attaining a healthy weight is also discussed.  BP/Weight 01/19/2021 12/14/2020 10/14/2020 09/13/2020 08/18/2020 06/02/2020 03/16/7492  Systolic BP 552 174 715 953 967 289 791  Diastolic BP 81 80 95 99 92 84 79  Wt. (Lbs) 179 180.8 - 180.2 174.5 178 182  BMI 28.04 28.32 - 28.22 27.33 27.88 28.51

## 2021-01-23 NOTE — Progress Notes (Signed)
[name]     MRN: [id number]      DOB: 02-Sep-1940  HPI: Patient is in for annual physical exam. Uncontrolled blood pressure, nicotine use are also addressed Recent labs,  are reviewed.  PE: BP (!) 199/81 (BP Location: Right Arm, Patient Position: Sitting, Cuff Size: Large)   Pulse 96   Temp 98.2 F (36.8 C)   Resp 16   Ht 5\' 7"  (1.702 m)   Wt 179 lb (81.2 kg)   SpO2 96%   BMI 28.04 kg/m   Pleasant  female, alert and oriented x 3, . Afebrile. HEENT No facial trauma or asymetry. Sinuses non tender.  Extra occullar muscles intact.. External ears normal, . Neck: supple, no adenopathy,JVD or thyromegaly.No bruits.  Chest: Clear to ascultation bilaterally.No crackles or wheezes. Non tender to palpation   Cardiovascular system; Heart sounds normal,  S1 and  S2 ,no S3.  No murmur, or thrill. Apical beat not displaced Peripheral pulses normal.  Abdomen: Soft, non tender, no organomegaly or masses. No bruits. Bowel sounds normal. No guarding, tenderness or rebound.     Musculoskeletal exam: Decreased ROM of spine, hips , shoulders and knees. deformity ,swelling and  crepitus noted. No muscle wasting or atrophy.   Neurologic: Cranial nerves 2 to 12 intact. Power, tone ,sensation and reflexes normal throughout. disturbance in gait. No tremor.  Skin: Intact, no ulceration, erythema , scaling or rash noted. Pigmentation normal throughout  Psych; Normal mood and affect. Judgement and concentration normal   Assessment & Plan:  Annual visit for general adult medical examination with abnormal findings Annual exam as documented. Counseling done  re healthy lifestyle involving commitment to 150 minutes exercise per week, heart healthy diet, and attaining healthy weight.The importance of adequate sleep also discussed. Regular seat belt use and home safety, is also discussed. Changes in health habits are decided on by the patient with goals and time frames  set  for achieving them. Immunization and cancer screening needs are specifically addressed at this visit.   NICOTINE ADDICTION Asked:confirms currently smokes cigarettes Assess: Unwilling to set a quit date, but is cutting back Advise: needs to QUIT to reduce risk of cancer, cardio and cerebrovascular disease Assist: counseled for 5 minutes and literature provided Arrange: follow up in 2 to 4 months   Overweight (BMI 25.0-29.9)  Patient re-educated about  the importance of commitment to a  minimum of 150 minutes of exercise per week as able.  The importance of healthy food choices with portion control discussed, as well as eating regularly and within a 12 hour window most days. The need to choose "clean , green" food 50 to 75% of the time is discussed, as well as to make water the primary drink and set a goal of 64 ounces water daily.    Weight /BMI 01/19/2021 12/14/2020 09/13/2020  WEIGHT 179 lb 180 lb 12.8 oz 180 lb 3.2 oz  HEIGHT 5\' 7"  5\' 7"  5\' 7"   BMI 28.04 kg/m2 28.32 kg/m2 28.22 kg/m2      Essential hypertension Uncontrolled, takes meds inconsisently, will change this DASH diet and commitment to daily physical activity for a minimum of 30 minutes discussed and encouraged, as a part of hypertension management. The importance of attaining a healthy weight is also discussed.  BP/Weight 01/19/2021 12/14/2020 10/14/2020 09/13/2020 08/18/2020 06/02/2020 7/62/8315  Systolic BP 176 160 737 106 269 485 462  Diastolic BP 81 80 95 99 92 84 79  Wt. (Lbs) 179 180.8 - 180.2  174.5 178 182  BMI 28.04 28.32 - 28.22 27.33 27.88 28.51

## 2021-01-23 NOTE — Assessment & Plan Note (Signed)
Asked:confirms currently smokes cigarettes °Assess: Unwilling to set a quit date, but is cutting back °Advise: needs to QUIT to reduce risk of cancer, cardio and cerebrovascular disease °Assist: counseled for 5 minutes and literature provided °Arrange: follow up in 2 to 4 months ° °

## 2021-01-23 NOTE — Assessment & Plan Note (Signed)

## 2021-01-23 NOTE — Assessment & Plan Note (Signed)
  Patient re-educated about  the importance of commitment to a  minimum of 150 minutes of exercise per week as able.  The importance of healthy food choices with portion control discussed, as well as eating regularly and within a 12 hour window most days. The need to choose "clean , green" food 50 to 75% of the time is discussed, as well as to make water the primary drink and set a goal of 64 ounces water daily.    Weight /BMI 01/19/2021 12/14/2020 09/13/2020  WEIGHT 179 lb 180 lb 12.8 oz 180 lb 3.2 oz  HEIGHT 5\' 7"  5\' 7"  5\' 7"   BMI 28.04 kg/m2 28.32 kg/m2 28.22 kg/m2

## 2021-01-27 ENCOUNTER — Other Ambulatory Visit (HOSPITAL_COMMUNITY): Payer: Self-pay | Admitting: Family Medicine

## 2021-01-27 DIAGNOSIS — Z1231 Encounter for screening mammogram for malignant neoplasm of breast: Secondary | ICD-10-CM

## 2021-02-13 ENCOUNTER — Other Ambulatory Visit: Payer: Self-pay

## 2021-02-13 ENCOUNTER — Encounter: Payer: Self-pay | Admitting: Family Medicine

## 2021-02-13 ENCOUNTER — Other Ambulatory Visit: Payer: Self-pay | Admitting: Family Medicine

## 2021-02-13 ENCOUNTER — Ambulatory Visit (INDEPENDENT_AMBULATORY_CARE_PROVIDER_SITE_OTHER): Payer: Medicare Other | Admitting: Family Medicine

## 2021-02-13 VITALS — BP 180/100 | HR 90 | Resp 16 | Ht 67.0 in | Wt 175.0 lb

## 2021-02-13 DIAGNOSIS — F17200 Nicotine dependence, unspecified, uncomplicated: Secondary | ICD-10-CM | POA: Diagnosis not present

## 2021-02-13 DIAGNOSIS — E663 Overweight: Secondary | ICD-10-CM

## 2021-02-13 DIAGNOSIS — E785 Hyperlipidemia, unspecified: Secondary | ICD-10-CM | POA: Diagnosis not present

## 2021-02-13 DIAGNOSIS — R7303 Prediabetes: Secondary | ICD-10-CM

## 2021-02-13 DIAGNOSIS — F17210 Nicotine dependence, cigarettes, uncomplicated: Secondary | ICD-10-CM

## 2021-02-13 DIAGNOSIS — I2510 Atherosclerotic heart disease of native coronary artery without angina pectoris: Secondary | ICD-10-CM | POA: Diagnosis not present

## 2021-02-13 DIAGNOSIS — I10 Essential (primary) hypertension: Secondary | ICD-10-CM

## 2021-02-13 DIAGNOSIS — E8881 Metabolic syndrome: Secondary | ICD-10-CM

## 2021-02-13 MED ORDER — AMLODIPINE BESYLATE 5 MG PO TABS: 5.0000 mg | ORAL_TABLET | Freq: Every day | ORAL | 0 refills | Status: DC

## 2021-02-13 MED ORDER — ROSUVASTATIN CALCIUM 10 MG PO TABS: 10.0000 mg | ORAL_TABLET | Freq: Every day | ORAL | 1 refills | Status: DC

## 2021-02-13 NOTE — Patient Instructions (Signed)
Follow-up in 2 weeks to reevaluate blood pressure in the office with MD.  Call if you need me sooner.    New additional medication for blood pressure amlodipine 5 mg take 1 at 10 PM every night.  Continue triamterene 1 tablet every day at 1 PM for your blood pressure.  New medication for high cholesterol is Crestor 10 mg take 1 every night at 10 PM with the new blood pressure pill amlodipine.  EKG in office today.  You are referred to cardiology for reevaluation of coronary artery disease.  Quit date for smoking cigarettes is today we agree on this.

## 2021-02-13 NOTE — Progress Notes (Signed)
[name]     MRN: [id number]      DOB: 1940/12/08   HPI Ms. Copenhaver is here for follow up and re-evaluation of chronic medical conditions, medication management and review of any available recent lab and radiology data.  Preventive health is updated, specifically  Cancer screening and Immunization.   Questions or concerns regarding consultations or procedures which the PT has had in the interim are  addressed. The PT denies any adverse reactions to current medications since the last visit.  There are no new concerns.  There are no specific complaints   ROS Denies recent fever or chills. Denies sinus pressure, nasal congestion, ear pain or sore throat. Denies chest congestion, productive cough or wheezing. Denies chest pains, palpitations and leg swelling Denies abdominal pain, nausea, vomiting,diarrhea or constipation.   Denies dysuria, frequency, hesitancy or incontinence. Denies joint pain, swelling and limitation in mobility. Denies headaches, seizures, numbness, or tingling. Denies depression, anxiety or insomnia. Denies skin break down or rash.   PE  BP (!) 180/100   Pulse 90   Resp 16   Ht '5\' 7"'$  (1.702 m)   Wt 175 lb (79.4 kg)   SpO2 92%   BMI 27.41 kg/m   Patient alert and oriented and in no cardiopulmonary distress.  HEENT: No facial asymmetry, EOMI,     Neck supple .  Chest: Clear to auscultation bilaterally.No reproducible tenderness  CVS: S1, S2 no murmurs, no S3.Regular rate. EKG: NSR, no ischemia or LVH ABD: Soft non tender.   Ext: No edema  MS: Decreased  ROM spine, shoulders, hips and knees.  Skin: Intact, no ulcerations or rash noted.  Psych: Good eye contact, normal affect. Memory intact not anxious or depressed appearing.  CNS: CN 2-12 intact, power,  normal throughout.no focal deficits noted.   Assessment & Plan  Essential hypertension Uncontrolled, add amlodipine 5 mg at bedtime, and re assess in 3 weeks EKG: NSR, no ischemia or  LVH DASH diet and commitment to daily physical activity for a minimum of 30 minutes discussed and encouraged, as a part of hypertension management. The importance of attaining a healthy weight is also discussed.  BP/Weight 02/13/2021 01/19/2021 12/14/2020 10/14/2020 09/13/2020 08/18/2020 123XX123  Systolic BP 99991111 123XX123 0000000 123456 0000000 123456 0000000  Diastolic BP 123XX123 81 80 95 99 92 84  Wt. (Lbs) 175 179 180.8 - 180.2 174.5 178  BMI 27.41 28.04 28.32 - 28.22 27.33 27.88       NICOTINE ADDICTION Asked:confirms currently smokes cigarettes Assess: willing to set a quit date,and  is cutting back Advise: needs to QUIT to reduce risk of cancer, cardio and cerebrovascular disease Assist: counseled for 5 minutes and literature provided Arrange: follow up in 2 to 4 months   Overweight (BMI 25.0-29.9)  Patient re-educated about  the importance of commitment to a  minimum of 150 minutes of exercise per week as able.  The importance of healthy food choices with portion control discussed, as well as eating regularly and within a 12 hour window most days. The need to choose "clean , green" food 50 to 75% of the time is discussed, as well as to make water the primary drink and set a goal of 64 ounces water daily.    Weight /BMI 02/13/2021 01/19/2021 12/14/2020  WEIGHT 175 lb 179 lb 180 lb 12.8 oz  HEIGHT '5\' 7"'$  '5\' 7"'$  '5\' 7"'$   BMI 27.41 kg/m2 28.04 kg/m2 28.32 kg/m2   improved   Prediabetes Patient educated about  the importance of limiting  Carbohydrate intake , the need to commit to daily physical activity for a minimum of 30 minutes , and to commit weight loss. The fact that changes in all these areas will reduce or eliminate all together the development of diabetes is stressed.  Deteriorated   Diabetic Labs Latest Ref Rng & Units 01/19/2021 04/04/2020 09/03/2019 04/28/2019 12/17/2018  HbA1c 4.8 - 5.6 % 6.0(H) - - - 5.7(H)  Microalbumin 0.00 - 1.89 mg/dL - - - - -  Micro/Creat Ratio 0.0 - 30.0 mg/g - - - - -   Chol 100 - 199 mg/dL 259(H) - 167 240(H) 242(H)  HDL >39 mg/dL 48 - 52 50 44  Calc LDL 0 - 99 mg/dL 178(H) - 97 161(H) 161(H)  Triglycerides 0 - 149 mg/dL 177(H) - 101 158(H) 184(H)  Creatinine 0.57 - 1.00 mg/dL 1.24(H) 1.11(H) 1.14(H) 1.27(H) 1.06(H)   BP/Weight 02/13/2021 01/19/2021 12/14/2020 10/14/2020 09/13/2020 08/18/2020 123XX123  Systolic BP 99991111 123XX123 0000000 123456 0000000 123456 0000000  Diastolic BP 123XX123 81 80 95 99 92 84  Wt. (Lbs) 175 179 180.8 - 180.2 174.5 178  BMI 27.41 28.04 28.32 - 28.22 27.33 27.88   Foot/eye exam completion dates 05/02/2010  Foot exam Order yes  Foot Form Completion -      Metabolic syndrome X The increased risk of cardiovascular disease associated with this diagnosis, and the need to consistently work on lifestyle to change this is discussed. Following  a  heart healthy diet ,commitment to 30 minutes of exercise at least 5 days per week, as well as control of blood sugar and cholesterol , and achieving a healthy weight are all the areas to be addressed .   Dyslipidemia (high LDL; low HDL) Hyperlipidemia:Low fat diet discussed and encouraged.   Lipid Panel  Lab Results  Component Value Date   CHOL 259 (H) 01/19/2021   HDL 48 01/19/2021   LDLCALC 178 (H) 01/19/2021   TRIG 177 (H) 01/19/2021   CHOLHDL 5.4 (H) 01/19/2021   Uncontrolled , needs to commit to daily statin and lower fat intake

## 2021-02-19 NOTE — Assessment & Plan Note (Signed)
Asked:confirms currently smokes cigarettes Assess: willing to set a quit date,and  is cutting back Advise: needs to QUIT to reduce risk of cancer, cardio and cerebrovascular disease Assist: counseled for 5 minutes and literature provided Arrange: follow up in 2 to 4 months

## 2021-02-19 NOTE — Assessment & Plan Note (Addendum)
Uncontrolled, add amlodipine 5 mg at bedtime, and re assess in 3 weeks EKG: NSR, no ischemia or LVH DASH diet and commitment to daily physical activity for a minimum of 30 minutes discussed and encouraged, as a part of hypertension management. The importance of attaining a healthy weight is also discussed.  BP/Weight 02/13/2021 01/19/2021 12/14/2020 10/14/2020 09/13/2020 08/18/2020 123XX123  Systolic BP 99991111 123XX123 0000000 123456 0000000 123456 0000000  Diastolic BP 123XX123 81 80 95 99 92 84  Wt. (Lbs) 175 179 180.8 - 180.2 174.5 178  BMI 27.41 28.04 28.32 - 28.22 27.33 27.88

## 2021-02-19 NOTE — Assessment & Plan Note (Signed)
The increased risk of cardiovascular disease associated with this diagnosis, and the need to consistently work on lifestyle to change this is discussed. Following  a  heart healthy diet ,commitment to 30 minutes of exercise at least 5 days per week, as well as control of blood sugar and cholesterol , and achieving a healthy weight are all the areas to be addressed .  

## 2021-02-19 NOTE — Assessment & Plan Note (Signed)
  Patient re-educated about  the importance of commitment to a  minimum of 150 minutes of exercise per week as able.  The importance of healthy food choices with portion control discussed, as well as eating regularly and within a 12 hour window most days. The need to choose "clean , green" food 50 to 75% of the time is discussed, as well as to make water the primary drink and set a goal of 64 ounces water daily.    Weight /BMI 02/13/2021 01/19/2021 12/14/2020  WEIGHT 175 lb 179 lb 180 lb 12.8 oz  HEIGHT '5\' 7"'$  '5\' 7"'$  '5\' 7"'$   BMI 27.41 kg/m2 28.04 kg/m2 28.32 kg/m2   improved

## 2021-02-19 NOTE — Assessment & Plan Note (Signed)
Hyperlipidemia:Low fat diet discussed and encouraged.   Lipid Panel  Lab Results  Component Value Date   CHOL 259 (H) 01/19/2021   HDL 48 01/19/2021   LDLCALC 178 (H) 01/19/2021   TRIG 177 (H) 01/19/2021   CHOLHDL 5.4 (H) 01/19/2021   Uncontrolled , needs to commit to daily statin and lower fat intake

## 2021-02-19 NOTE — Assessment & Plan Note (Signed)
Patient educated about the importance of limiting  Carbohydrate intake , the need to commit to daily physical activity for a minimum of 30 minutes , and to commit weight loss. The fact that changes in all these areas will reduce or eliminate all together the development of diabetes is stressed.  Deteriorated   Diabetic Labs Latest Ref Rng & Units 01/19/2021 04/04/2020 09/03/2019 04/28/2019 12/17/2018  HbA1c 4.8 - 5.6 % 6.0(H) - - - 5.7(H)  Microalbumin 0.00 - 1.89 mg/dL - - - - -  Micro/Creat Ratio 0.0 - 30.0 mg/g - - - - -  Chol 100 - 199 mg/dL 259(H) - 167 240(H) 242(H)  HDL >39 mg/dL 48 - 52 50 44  Calc LDL 0 - 99 mg/dL 178(H) - 97 161(H) 161(H)  Triglycerides 0 - 149 mg/dL 177(H) - 101 158(H) 184(H)  Creatinine 0.57 - 1.00 mg/dL 1.24(H) 1.11(H) 1.14(H) 1.27(H) 1.06(H)   BP/Weight 02/13/2021 01/19/2021 12/14/2020 10/14/2020 09/13/2020 08/18/2020 [id number]  Systolic BP 99991111 [id number] 0000000 123456 0000000 123456 0000000  Diastolic BP 123XX123 81 80 95 99 92 84  Wt. (Lbs) 175 179 180.8 - 180.2 174.5 178  BMI 27.41 28.04 28.32 - 28.22 27.33 27.88   Foot/eye exam completion dates 05/02/2010  Foot exam Order yes  Foot Form Completion -

## 2021-03-02 ENCOUNTER — Ambulatory Visit (HOSPITAL_COMMUNITY): Payer: Medicare Other

## 2021-03-07 ENCOUNTER — Ambulatory Visit (INDEPENDENT_AMBULATORY_CARE_PROVIDER_SITE_OTHER): Payer: Medicare Other | Admitting: Family Medicine

## 2021-03-07 ENCOUNTER — Other Ambulatory Visit: Payer: Self-pay

## 2021-03-07 ENCOUNTER — Encounter: Payer: Self-pay | Admitting: Family Medicine

## 2021-03-07 ENCOUNTER — Ambulatory Visit: Payer: Medicare Other

## 2021-03-07 VITALS — BP 158/84 | HR 91 | Resp 16 | Ht 67.0 in | Wt 170.1 lb

## 2021-03-07 DIAGNOSIS — S0990XA Unspecified injury of head, initial encounter: Secondary | ICD-10-CM | POA: Diagnosis not present

## 2021-03-07 DIAGNOSIS — M25561 Pain in right knee: Secondary | ICD-10-CM | POA: Diagnosis not present

## 2021-03-07 DIAGNOSIS — E785 Hyperlipidemia, unspecified: Secondary | ICD-10-CM

## 2021-03-07 DIAGNOSIS — R7303 Prediabetes: Secondary | ICD-10-CM | POA: Diagnosis not present

## 2021-03-07 DIAGNOSIS — F17210 Nicotine dependence, cigarettes, uncomplicated: Secondary | ICD-10-CM | POA: Diagnosis not present

## 2021-03-07 DIAGNOSIS — I10 Essential (primary) hypertension: Secondary | ICD-10-CM

## 2021-03-07 DIAGNOSIS — R4020 Unspecified coma: Secondary | ICD-10-CM

## 2021-03-07 DIAGNOSIS — Z0001 Encounter for general adult medical examination with abnormal findings: Secondary | ICD-10-CM

## 2021-03-07 DIAGNOSIS — Z0000 Encounter for general adult medical examination without abnormal findings: Secondary | ICD-10-CM

## 2021-03-07 DIAGNOSIS — F17200 Nicotine dependence, unspecified, uncomplicated: Secondary | ICD-10-CM

## 2021-03-07 MED ORDER — TRIAMTERENE-HCTZ 75-50 MG PO TABS: ORAL_TABLET | ORAL | 3 refills | Status: DC

## 2021-03-07 NOTE — Patient Instructions (Signed)
Health Maintenance, Female Adopting a healthy lifestyle and getting preventive care are important in promoting health and wellness. Ask your health care provider about: The right schedule for you to have regular tests and exams. Things you can do on your own to prevent diseases and keep yourself healthy. What should I know about diet, weight, and exercise? Eat a healthy diet  Eat a diet that includes plenty of vegetables, fruits, low-fat dairy products, and lean protein. Do not eat a lot of foods that are high in solid fats, added sugars, or sodium.  Maintain a healthy weight Body mass index (BMI) is used to identify weight problems. It estimates body fat based on height and weight. Your health care provider can help determineyour BMI and help you achieve or maintain a healthy weight. Get regular exercise Get regular exercise. This is one of the most important things you can do for your health. Most adults should: Exercise for at least 150 minutes each week. The exercise should increase your heart rate and make you sweat (moderate-intensity exercise). Do strengthening exercises at least twice a week. This is in addition to the moderate-intensity exercise. Spend less time sitting. Even light physical activity can be beneficial. Watch cholesterol and blood lipids Have your blood tested for lipids and cholesterol at 78 years of age, then havethis test every 5 years. Have your cholesterol levels checked more often if: Your lipid or cholesterol levels are high. You are older than 78 years of age. You are at high risk for heart disease. What should I know about cancer screening? Depending on your health history and family history, you may need to have cancer screening at various ages. This may include screening for: Breast cancer. Cervical cancer. Colorectal cancer. Skin cancer. Lung cancer. What should I know about heart disease, diabetes, and high blood pressure? Blood pressure and heart  disease High blood pressure causes heart disease and increases the risk of stroke. This is more likely to develop in people who have high blood pressure readings, are of African descent, or are overweight. Have your blood pressure checked: Every 3-5 years if you are 18-39 years of age. Every year if you are 40 years old or older. Diabetes Have regular diabetes screenings. This checks your fasting blood sugar level. Have the screening done: Once every three years after age 40 if you are at a normal weight and have a low risk for diabetes. More often and at a younger age if you are overweight or have a high risk for diabetes. What should I know about preventing infection? Hepatitis B If you have a higher risk for hepatitis B, you should be screened for this virus. Talk with your health care provider to find out if you are at risk forhepatitis B infection. Hepatitis C Testing is recommended for: Everyone born from 1945 through 1965. Anyone with known risk factors for hepatitis C. Sexually transmitted infections (STIs) Get screened for STIs, including gonorrhea and chlamydia, if: You are sexually active and are younger than 78 years of age. You are older than 78 years of age and your health care provider tells you that you are at risk for this type of infection. Your sexual activity has changed since you were last screened, and you are at increased risk for chlamydia or gonorrhea. Ask your health care provider if you are at risk. Ask your health care provider about whether you are at high risk for HIV. Your health care provider may recommend a prescription medicine to help   prevent HIV infection. If you choose to take medicine to prevent HIV, you should first get tested for HIV. You should then be tested every 3 months for as long as you are taking the medicine. Pregnancy If you are about to stop having your period (premenopausal) and you may become pregnant, seek counseling before you get  pregnant. Take 400 to 800 micrograms (mcg) of folic acid every day if you become pregnant. Ask for birth control (contraception) if you want to prevent pregnancy. Osteoporosis and menopause Osteoporosis is a disease in which the bones lose minerals and strength with aging. This can result in bone fractures. If you are 65 years old or older, or if you are at risk for osteoporosis and fractures, ask your health care provider if you should: Be screened for bone loss. Take a calcium or vitamin D supplement to lower your risk of fractures. Be given hormone replacement therapy (HRT) to treat symptoms of menopause. Follow these instructions at home: Lifestyle Do not use any products that contain nicotine or tobacco, such as cigarettes, e-cigarettes, and chewing tobacco. If you need help quitting, ask your health care provider. Do not use street drugs. Do not share needles. Ask your health care provider for help if you need support or information about quitting drugs. Alcohol use Do not drink alcohol if: Your health care provider tells you not to drink. You are pregnant, may be pregnant, or are planning to become pregnant. If you drink alcohol: Limit how much you use to 0-1 drink a day. Limit intake if you are breastfeeding. Be aware of how much alcohol is in your drink. In the [place] one drink equals one 12 oz bottle of beer (355 mL), one 5 oz glass of wine (148 mL), or one 1 oz glass of hard liquor (44 mL). General instructions Schedule regular health, dental, and eye exams. Stay current with your vaccines. Tell your health care provider if: You often feel depressed. You have ever been abused or do not feel safe at home. Summary Adopting a healthy lifestyle and getting preventive care are important in promoting health and wellness. Follow your health care provider's instructions about healthy diet, exercising, and getting tested or screened for diseases. Follow your health care provider's  instructions on monitoring your cholesterol and blood pressure. This information is not intended to replace advice given to you by your health care provider. Make sure you discuss any questions you have with your healthcare provider. Document Revised: 07/02/2018 Document Reviewed: 07/02/2018 Elsevier Patient Education  2022 Elsevier Inc.  

## 2021-03-07 NOTE — Progress Notes (Signed)
Subjective:   [name] is a 80 y.o. female who presents for Medicare Annual (Subsequent) preventive examination. I connected with  [name] on 03/07/21 by a audio enabled telemedicine application and verified that I am speaking with the correct person using two identifiers.   I discussed the limitations of evaluation and management by telemedicine. The patient expressed understanding and agreed to proceed.   Review of Systems    Defer to PCP       Objective:    There were no vitals filed for this visit. There is no height or weight on file to calculate BMI.  Advanced Directives 03/07/2021 03/01/2020 02/05/2020 02/24/2018 12/03/2016 02/20/2016 07/06/2014  Does Patient Have a Medical Advance Directive? Yes Yes Yes No No No No  Type of Advance Directive Living will;Healthcare Power of [place] will - - - -  Does patient want to make changes to medical advance directive? - No - Patient declined - Yes (ED - Information included in AVS) - - -  Copy of Healthcare Power of Attorney in Chart? - No - copy requested - - - - -  Would patient like information on creating a medical advance directive? - - - - Yes (MAU/Ambulatory/Procedural Areas - Information given) Yes - Educational materials given No - patient declined information  Pre-existing out of facility DNR order (yellow form or pink MOST form) - - - - - - -    Current Medications (verified) Outpatient Encounter Medications as of 03/07/2021  Medication Sig   ALOE VERA PO Take 500 mg by mouth daily as needed (stomach).    amLODipine (NORVASC) 5 MG tablet TAKE 1 TABLET(5 MG) BY MOUTH DAILY   aspirin EC 81 MG tablet Take 81 mg by mouth daily.   Calcium Carb-Cholecalciferol (CALCIUM 600/VITAMIN D3) 600-800 MG-UNIT TABS Take 1 tablet by mouth daily.   fluticasone (FLONASE) 50 MCG/ACT nasal spray SHAKE LIQUID AND USE 2 SPRAYS IN EACH NOSTRIL DAILY AS NEEDED FOR ALLERGIES OR RHINITIS   Magnesium  Hydroxide (MILK OF MAGNESIA PO) Take by mouth as needed. 1 capful as needed   montelukast (SINGULAIR) 10 MG tablet Take 10 mg by mouth daily.   Multiple Vitamin (MULTIVITAMIN) tablet Take 2 tablets by mouth daily.    Omega 3 1000 MG CAPS Take 1 capsule by mouth daily.   OVER THE COUNTER MEDICATION Beet powder at night   pantoprazole (PROTONIX) 40 MG tablet Take 1 tablet (40 mg total) by mouth daily before breakfast.   rosuvastatin (CRESTOR) 10 MG tablet Take 1 tablet (10 mg total) by mouth daily.   triamterene-hydrochlorothiazide (MAXZIDE) 75-50 MG tablet Take half tablet in the morning at 10 am and half every evening at 10 pm   No facility-administered encounter medications on file as of 03/07/2021.    Allergies (verified) Patient has no known allergies.   History: Past Medical History:  Diagnosis Date   COPD (chronic obstructive pulmonary disease) ([place])    Hiatal hernia without gangrene and obstruction 03/23/2015   Hypertension 2004   Lung nodule seen on imaging study    Nicotine addiction    OA (osteoarthritis) of knee    Obesity    Sinusitis    Past Surgical History:  Procedure Laterality Date   bilateral cataract surgery  7/27 & 03/01/09   Dr. Gershon Crane     cholecystectomy     COLONOSCOPY  04/2004   Dr. Leane Para Smith-->melanosis coli   COLONOSCOPY WITH ESOPHAGOGASTRODUODENOSCOPY (EGD) N/A 02/25/2013  Dr. Fields:Normal mucosa in the terminal ileum/Severe melanosis throughout the entire examined colon/ONE COLON POLYP REMOVED (tubular adenoma)/Mild diverticulosis  in the sigmoid colon/EGD:The mucosa of the esophagus appeared normal/Non-erosive gastritis, empiric dilation with Savary dilator   ESOPHAGOGASTRODUODENOSCOPY  11/07/2010   Gryffin Altice:hiatal hernia/no evidence of Barrett esophagitis.  The Z-line was noted to be at 39 cm from the teeth. CLO test negative   ESOPHAGOGASTRODUODENOSCOPY N/A 09/11/2013   Dr.Fields-  incomplete esophageal web in mid-esophagus, medium sized hialtal  hernia, PUD. bx=focal erosion with inflammation and fibrosis   ESOPHAGOGASTRODUODENOSCOPY N/A 07/06/2014   healed ulcers, persistent erosions on aspirin, small hiatal hernia   ESOPHAGOGASTRODUODENOSCOPY N/A 02/05/2020   normal esophagus s/p dilation, normal stomach, normal duodenal bulb.    knee arthroscopy right     LEFT HEART CATHETERIZATION WITH CORONARY ANGIOGRAM N/A 04/08/2014   Procedure: LEFT HEART CATHETERIZATION WITH CORONARY ANGIOGRAM;  Surgeon: Sinclair Grooms, MD;  Location: [facility] CATH LAB;  Service: Cardiovascular;  Laterality: N/A;   MALONEY DILATION N/A 02/25/2013   Procedure: Venia Minks DILATION;  Surgeon: Danie Binder, MD;  Location: [facility] ENDO SUITE;  Service: Endoscopy;  Laterality: N/A;   PARTIAL HYSTERECTOMY     SAVORY DILATION N/A 02/25/2013   Procedure: SAVORY DILATION;  Surgeon: Danie Binder, MD;  Location: [facility] ENDO SUITE;  Service: Endoscopy;  Laterality: N/A;   total knee arthroplasty right  05/29/2005   Dr. Aline Brochure    Family History  Problem Relation Age of Onset   Diabetes Mother    Hypertension Mother    Coronary artery disease Mother    Pneumonia Father    Hypertension Sister    Hypertension Sister    Hypertension Brother        Psychologist, forensic   Diabetes Brother    Hypertension Brother    Colon cancer Neg Hx    Liver disease Neg Hx    Social History   Socioeconomic History   Marital status: Widowed    Spouse name: Not on file   Number of children: 4   Years of education: Not on file   Highest education level: Not on file  Occupational History   Occupation: employed     Employer: RETIRED  Tobacco Use   Smoking status: Some Days    Packs/day: 0.10    Years: 10.00    Pack years: 1.00    Types: Cigarettes    Start date: 07/23/1962   Smokeless tobacco: Never   Tobacco comments:    1 per day   Vaping Use   Vaping Use: Never used  Substance and Sexual Activity   Alcohol use: No    Alcohol/week: 0.0 standard drinks   Drug use: No   Sexual activity:  Never  Other Topics Concern   Not on file  Social History Narrative   Not on file   Social Determinants of Health   Financial Resource Strain: Low Risk    Difficulty of Paying Living Expenses: Not hard at all  Food Insecurity: No Food Insecurity   Worried About Charity fundraiser in the Last Year: Never true   [place] in the Last Year: Never true  Transportation Needs: No Transportation Needs   Lack of Transportation (Medical): No   Lack of Transportation (Non-Medical): No  Physical Activity: Insufficiently Active   Days of Exercise per Week: 3 days   Minutes of Exercise per Session: 30 min  Stress: No Stress Concern Present   Feeling of Stress : Not at all  Social Connections: Moderately Isolated   Frequency of Communication with Friends and Family: More than three times a week   Frequency of Social Gatherings with Friends and Family: More than three times a week   Attends Religious Services: More than 4 times per year   Active Member of [employer] or Organizations: No   Attends Archivist Meetings: Never   Marital Status: Widowed    Tobacco Counseling Ready to quit: Not Answered Counseling given: Not Answered Tobacco comments: 1 per day    Clinical Intake:  Pre-visit preparation completed: Yes  Pain : No/denies pain     Nutritional Risks: None Diabetes: No  How often do you need to have someone help you when you read instructions, pamphlets, or other written materials from your doctor or pharmacy?: 1 - Never  Diabetic?no  Interpreter Needed?: No  Information entered by :: Yusra Ravert J,CMA   Activities of Daily Living In your present state of health, do you have any difficulty performing the following activities: 03/07/2021  Hearing? N  Vision? N  Difficulty concentrating or making decisions? N  Walking or climbing stairs? N  Dressing or bathing? N  Doing errands, shopping? N  Preparing Food and eating ? N  Using the Toilet? N  In the past  six months, have you accidently leaked urine? N  Do you have problems with loss of bowel control? N  Managing your Medications? N  Managing your Finances? N  Housekeeping or managing your Housekeeping? N  Some recent data might be hidden    Patient Care Team: Fayrene Helper, MD as PCP - General Herminio Commons, MD (Inactive) as Attending Physician (Cardiology) Danie Binder, MD (Inactive) as Consulting Physician (Gastroenterology)  Indicate any recent Medical Services you may have received from other than [facility] providers in the past year (date may be approximate).     Assessment:   This is a routine wellness examination for [facility].  Hearing/Vision screen No results found.  Dietary issues and exercise activities discussed: Current Exercise Habits: Home exercise routine, Type of exercise: walking, Time (Minutes): 30, Frequency (Times/Week): 4, Weekly Exercise (Minutes/Week): 120, Intensity: Mild   Goals Addressed   None    Depression Screen PHQ 2/9 Scores 03/07/2021 02/13/2021 01/19/2021 08/18/2020 05/25/2020 03/01/2020 02/11/2020  PHQ - 2 Score 0 0 0 0 0 0 0  PHQ- 9 Score - - - - - - -    Fall Risk Fall Risk  03/07/2021 02/13/2021 01/19/2021 08/18/2020 06/02/2020  Falls in the past year? 1 0 0 0 0  Number falls in past yr: 0 0 0 0 0  Injury with Fall? 1 0 0 0 0  Risk for fall due to : - - No Fall Risks - -  Follow up - - Falls evaluation completed - -    FALL RISK PREVENTION PERTAINING TO THE HOME:  Any stairs in or around the home? Yes  If so, are there any without handrails? No  Home free of loose throw rugs in walkways, pet beds, electrical cords, etc? Yes  Adequate lighting in your home to reduce risk of falls? Yes   ASSISTIVE DEVICES UTILIZED TO PREVENT FALLS:  Life alert? No  Use of a cane, walker or w/c? No  Grab bars in the bathroom? Yes  Shower chair or bench in shower? Yes  Elevated toilet seat or a handicapped toilet? Yes   TIMED UP AND GO:  Was the  test performed?  N/A .  Length of time  to ambulate 10 feet: N/A sec.     Cognitive Function:     6CIT Screen 03/07/2021 03/01/2020 02/26/2019 02/24/2018 12/03/2016  What Year? 0 points 0 points 0 points 0 points 0 points  What month? 0 points 0 points 0 points 0 points 0 points  What time? 0 points 0 points 0 points 0 points 0 points  Count back from 20 0 points 0 points 0 points 2 points 0 points  Months in reverse 0 points 0 points 2 points 2 points 0 points  Repeat phrase 10 points 0 points 0 points 2 points 0 points  Total Score 10 0 2 6 0    Immunizations Immunization History  Administered Date(s) Administered   Fluad Quad(high Dose 65+) 04/27/2019, 04/14/2020   H1N1 06/21/2008   Influenza Split 05/20/2012   Influenza Whole 05/07/2007, 04/19/2008, 05/09/2010, 03/30/2011   Influenza, High Dose Seasonal PF 06/04/2018   Influenza,inj,Quad PF,6+ Mos 04/29/2013, 06/15/2014, 05/24/2015, 06/11/2016, 05/06/2017   PFIZER(Purple Top)SARS-COV-2 Vaccination 10/15/2019, 11/07/2019, 06/02/2020   PPD Test 09/01/2018, 09/01/2018, 04/18/2020   Pneumococcal Conjugate-13 02/16/2014   Pneumococcal Polysaccharide-23 04/19/2008   Td 07/24/1995, 03/17/2009    TDAP status: Due, Education has been provided regarding the importance of this vaccine. Advised may receive this vaccine at local pharmacy or Health Dept. Aware to provide a copy of the vaccination record if obtained from local pharmacy or Health Dept. Verbalized acceptance and understanding.  Flu Vaccine status: Due, Education has been provided regarding the importance of this vaccine. Advised may receive this vaccine at local pharmacy or Health Dept. Aware to provide a copy of the vaccination record if obtained from local pharmacy or Health Dept. Verbalized acceptance and understanding.  Pneumococcal vaccine status: Up to date  Covid-19 vaccine status: Information provided on how to obtain vaccines.   Qualifies for Shingles Vaccine? Yes    Zostavax completed  n/a   Shingrix Completed?: No.    Education has been provided regarding the importance of this vaccine. Patient has been advised to call insurance company to determine out of pocket expense if they have not yet received this vaccine. Advised may also receive vaccine at local pharmacy or Health Dept. Verbalized acceptance and understanding.  Screening Tests Health Maintenance  Topic Date Due   Zoster Vaccines- Shingrix (1 of 2) Never done   COVID-19 Vaccine (4 - Booster for Pfizer series) 09/30/2020   INFLUENZA VACCINE  02/20/2021   TETANUS/TDAP  06/14/2022 (Originally 03/18/2019)   DEXA SCAN  Completed   Hepatitis C Screening  Completed   PNA vac Low Risk Adult  Completed   HPV VACCINES  Aged Out    Health Maintenance  Health Maintenance Due  Topic Date Due   Zoster Vaccines- Shingrix (1 of 2) Never done   COVID-19 Vaccine (4 - Booster for Pfizer series) 09/30/2020   INFLUENZA VACCINE  02/20/2021    Colorectal cancer screening: No longer required.   Mammogram status: Completed 02/29/2020. Repeat every year  Bone Density status: Completed 03/05/2018. Results reflect: Bone density results: NORMAL. Repeat every 2 years.  Lung Cancer Screening: (Low Dose CT Chest recommended if Age 64-80 years, 30 pack-year currently smoking OR have quit w/in 15years.) does not qualify.   Lung Cancer Screening Referral: NO  Additional Screening:  Hepatitis C Screening: does qualify; Completed 04/04/2020  Vision Screening: Recommended annual ophthalmology exams for early detection of glaucoma and other disorders of the eye. Is the patient up to date with their annual eye exam?  Yes  Who is  the provider or what is the name of the office in which the patient attends annual eye exams? [facility] If pt is not established with a provider, would they like to be referred to a provider to establish care?  N/A .   Dental Screening: Recommended annual dental exams for proper oral  hygiene  Community Resource Referral / Chronic Care Management: CRR required this visit?  No   CCM required this visit?  No      Plan:     I have personally reviewed and noted the following in the patient's chart:   Medical and social history Use of alcohol, tobacco or illicit drugs  Current medications and supplements including opioid prescriptions.  Functional ability and status Nutritional status Physical activity Advanced directives List of other physicians Hospitalizations, surgeries, and ER visits in previous 12 months Vitals Screenings to include cognitive, depression, and falls Referrals and appointments  In addition, I have reviewed and discussed with patient certain preventive protocols, quality metrics, and best practice recommendations. A written personalized care plan for preventive services as well as general preventive health recommendations were provided to patient.     Edgar Frisk, [facility]   03/07/2021   Nurse Notes: Non face to face 30 minutes  Ms. Heinzelman , Thank you for taking time to come for your Medicare Wellness Visit. I appreciate your ongoing commitment to your health goals. Please review the following plan we discussed and let me know if I can assist you in the future.   These are the goals we discussed:  Goals      Increase physical activity     Try walking at least 3 days per week for 20-30 mins at a time      Quit smoking / using tobacco        This is a list of the screening recommended for you and due dates:  Health Maintenance  Topic Date Due   Zoster (Shingles) Vaccine (1 of 2) Never done   COVID-19 Vaccine (4 - Booster for Pfizer series) 09/30/2020   Flu Shot  02/20/2021   Tetanus Vaccine  06/14/2022*   DEXA scan (bone density measurement)  Completed   Hepatitis C Screening: USPSTF Recommendation to screen - Ages 44-79 yo.  Completed   Pneumonia vaccines  Completed   HPV Vaccine  Aged Out  *Topic was postponed. The date shown  is not the original due date.
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
## 2021-03-07 NOTE — Assessment & Plan Note (Signed)
Asked:confirms currently smokes cigarettes °Assess: Unwilling to set a quit date, but is cutting back °Advise: needs to QUIT to reduce risk of cancer, cardio and cerebrovascular disease °Assist: counseled for 5 minutes and literature provided °Arrange: follow up in 2 to 4 months ° °
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
## 2021-03-07 NOTE — Assessment & Plan Note (Signed)
Patient educated about the importance of limiting  Carbohydrate intake , the need to commit to daily physical activity for a minimum of 30 minutes , and to commit weight loss. The fact that changes in all these areas will reduce or eliminate all together the development of diabetes is stressed.  Deteriorated Updated lab needed at/ before next visit.   Diabetic Labs Latest Ref Rng & Units 01/19/2021 04/04/2020 09/03/2019 04/28/2019 12/17/2018  HbA1c 4.8 - 5.6 % 6.0(H) - - - 5.7(H)  Microalbumin 0.00 - 1.89 mg/dL - - - - -  Micro/Creat Ratio 0.0 - 30.0 mg/g - - - - -  Chol 100 - 199 mg/dL 259(H) - 167 240(H) 242(H)  HDL >39 mg/dL 48 - 52 50 44  Calc LDL 0 - 99 mg/dL 178(H) - 97 161(H) 161(H)  Triglycerides 0 - 149 mg/dL 177(H) - 101 158(H) 184(H)  Creatinine 0.57 - 1.00 mg/dL 1.24(H) 1.11(H) 1.14(H) 1.27(H) 1.06(H)   BP/Weight 03/07/2021 02/13/2021 01/19/2021 12/14/2020 10/14/2020 09/13/2020 [id number]  Systolic BP 0000000 [id number] 0000000 [id number] 0000000 [id number]  Diastolic BP 84 123XX123 81 80 95 99 92  Wt. (Lbs) 170.12 175 179 180.8 - 180.2 174.5  BMI 26.64 27.41 28.04 28.32 - 28.22 27.33   Foot/eye exam completion dates 05/02/2010  Foot exam Order yes  Foot Form Completion -

## 2021-03-07 NOTE — Assessment & Plan Note (Signed)
Improved, take triamterene 75 mg dose in half tablets 12 hours apart, amlodipine asbefore DASH diet and commitment to daily physical activity for a minimum of 30 minutes discussed and encouraged, as a part of hypertension management. The importance of attaining a healthy weight is also discussed.  BP/Weight 03/07/2021 02/13/2021 01/19/2021 12/14/2020 10/14/2020 09/13/2020 [id number]  Systolic BP 0000000 [id number] 0000000 [id number] 0000000 [id number]  Diastolic BP 84 [id number] 81 80 95 99 92  Wt. (Lbs) 170.12 175 179 180.8 - 180.2 174.5  BMI 26.64 27.41 28.04 28.32 - 28.22 27.33

## 2021-03-07 NOTE — Progress Notes (Signed)
[name]     MRN: [id number]      DOB: Apr 14, 1941   HPI [name] is here for follow up of uncontrolled hypertension C/o syncopal episode lasting about 2.5 hours on 02/27/2021. States she did not urinate on herself or defecate, fell and hit her head on with right forehead which was more swollen, and also c/o right inner knee pain. Did not go to the Ed, lay on the floor till she aroused enough to get up and start moving around. No new c/o headache or memory loss States it occurred after taking her 1 PM BP medication, has continued to take as before, but does not feel safe leaving the house after her medication ROS Denies recent fever or chills. Denies sinus pressure, nasal congestion, ear pain or sore throat. Denies chest congestion, productive cough or wheezing. Denies chest pains, palpitations and leg swelling Denies abdominal pain, nausea, vomiting,diarrhea or constipation.   Denies dysuria, frequency, hesitancy c/o incontinence. Chronic  joint pain, swelling and limitation in mobility. Denies headaches, seizures, numbness, or tingling. Denies depression, anxiety or insomnia. Denies skin break down or rash.   PE  BP (!) 158/84   Pulse 91   Resp 16   Ht '5\' 7"'$  (1.702 m)   Wt 170 lb 1.9 oz (77.2 kg)   SpO2 96%   BMI 26.64 kg/m   Patient alert and oriented and in no cardiopulmonary distress.  HEENT: No facial asymmetry, EOMI,     Neck supple .slight hematoma on right forehead  Chest: Clear to auscultation bilaterally.decreased throughout  CVS: S1, S2 no murmurs, no S3.Regular rate.  ABD: Soft non tender.   Ext: No edema  MS: decreased  ROM spine, shoulders, hips and tender over medical aspect right knee.  Skin: Intact, no ulcerations or rash noted.  Psych: Good eye contact, normal affect. Memory intact not anxious or depressed appearing.  CNS: CN 2-12 intact, power,  normal throughout.no focal deficits noted.   Assessment & Plan  Essential  hypertension Improved, take triamterene 75 mg dose in half tablets 12 hours apart, amlodipine asbefore DASH diet and commitment to daily physical activity for a minimum of 30 minutes discussed and encouraged, as a part of hypertension management. The importance of attaining a healthy weight is also discussed.  BP/Weight 03/07/2021 02/13/2021 01/19/2021 12/14/2020 10/14/2020 09/13/2020 [id number]  Systolic BP 0000000 99991111 [id number] 0000000 123456 0000000 123456  Diastolic BP 84 123XX123 81 80 95 99 92  Wt. (Lbs) 170.12 175 179 180.8 - 180.2 174.5  BMI 26.64 27.41 28.04 28.32 - 28.22 27.33       NICOTINE ADDICTION Asked:confirms currently smokes cigarettes Assess: Unwilling to set a quit date, but is cutting back Advise: needs to QUIT to reduce risk of cancer, cardio and cerebrovascular disease Assist: counseled for 5 minutes and literature provided Arrange: follow up in 2 to 4 months   LOC (loss of consciousness) ([place]) Reports 2 hour h/o LOC in her home on 02/27/2021,unwitnessed, has hematoma on right forehead, needs head scan and also carotid doppker  Prediabetes Patient educated about the importance of limiting  Carbohydrate intake , the need to commit to daily physical activity for a minimum of 30 minutes , and to commit weight loss. The fact that changes in all these areas will reduce or eliminate all together the development of diabetes is stressed.  Deteriorated Updated lab needed at/ before next visit.   Diabetic Labs Latest Ref Rng & Units 01/19/2021 04/04/2020 09/03/2019 04/28/2019 12/17/2018  HbA1c  4.8 - 5.6 % 6.0(H) - - - 5.7(H)  Microalbumin 0.00 - 1.89 mg/dL - - - - -  Micro/Creat Ratio 0.0 - 30.0 mg/g - - - - -  Chol 100 - 199 mg/dL 259(H) - 167 240(H) 242(H)  HDL >39 mg/dL 48 - 52 50 44  Calc LDL 0 - 99 mg/dL 178(H) - 97 161(H) 161(H)  Triglycerides 0 - 149 mg/dL 177(H) - 101 158(H) 184(H)  Creatinine 0.57 - 1.00 mg/dL 1.24(H) 1.11(H) 1.14(H) 1.27(H) 1.06(H)   BP/Weight 03/07/2021 02/13/2021 01/19/2021  12/14/2020 10/14/2020 09/13/2020 [id number]  Systolic BP 0000000 [id number] 123XX123 0000000 123456 0000000 123456  Diastolic BP 84 123XX123 81 80 95 99 92  Wt. (Lbs) 170.12 175 179 180.8 - 180.2 174.5  BMI 26.64 27.41 28.04 28.32 - 28.22 27.33   Foot/eye exam completion dates 05/02/2010  Foot exam Order yes  Foot Form Completion -      Acute pain of right knee Trauma on 8/8, tender over medial aspect, needs X ray. Home safety and fall risk reduction discussed

## 2021-03-07 NOTE — Patient Instructions (Addendum)
F/U in  5 to 6 weeks, re evaluate blood pressure, call if you need me sooner  Change how you take the triamterene tablet, take HALF tablet in the morning at 10 am and the other HALF at 10 pm at night  You are referred for head scan and an [place] of your arteries to the brain  An X ray of your right knee is ordered , please get it the day of your  scans  Careful no more falls  Fasting lipid, cmp and EGFr 5 days before next visit  Thanks for choosing [facility], we consider it a privelige to serve you.

## 2021-03-09 ENCOUNTER — Ambulatory Visit: Payer: Medicare Other | Admitting: Family Medicine

## 2021-03-09 ENCOUNTER — Other Ambulatory Visit: Payer: Self-pay

## 2021-03-09 ENCOUNTER — Ambulatory Visit (HOSPITAL_COMMUNITY)
Admission: RE | Admit: 2021-03-09 | Discharge: 2021-03-09 | Disposition: A | Discharge status: Home / Self Care | Payer: Medicare Other | Source: Ambulatory Visit | Attending: Family Medicine | Admitting: Family Medicine

## 2021-03-09 DIAGNOSIS — M25461 Effusion, right knee: Secondary | ICD-10-CM | POA: Diagnosis not present

## 2021-03-09 DIAGNOSIS — M25561 Pain in right knee: Secondary | ICD-10-CM | POA: Insufficient documentation

## 2021-03-14 ENCOUNTER — Ambulatory Visit (HOSPITAL_COMMUNITY)
Admission: RE | Admit: 2021-03-14 | Discharge: 2021-03-14 | Disposition: A | Discharge status: Home / Self Care | Payer: Medicare Other | Source: Ambulatory Visit | Attending: Family Medicine | Admitting: Family Medicine

## 2021-03-14 ENCOUNTER — Other Ambulatory Visit: Payer: Self-pay

## 2021-03-14 DIAGNOSIS — I771 Stricture of artery: Secondary | ICD-10-CM | POA: Diagnosis not present

## 2021-03-14 DIAGNOSIS — I6523 Occlusion and stenosis of bilateral carotid arteries: Secondary | ICD-10-CM | POA: Diagnosis not present

## 2021-03-14 DIAGNOSIS — R4020 Unspecified coma: Secondary | ICD-10-CM | POA: Insufficient documentation

## 2021-03-20 ENCOUNTER — Ambulatory Visit (HOSPITAL_COMMUNITY)
Admission: RE | Admit: 2021-03-20 | Discharge: 2021-03-20 | Disposition: A | Discharge status: Home / Self Care | Payer: Medicare Other | Source: Ambulatory Visit | Attending: Family Medicine | Admitting: Family Medicine

## 2021-03-20 ENCOUNTER — Other Ambulatory Visit (HOSPITAL_COMMUNITY): Payer: Self-pay | Admitting: Nurse Practitioner

## 2021-03-20 ENCOUNTER — Other Ambulatory Visit (HOSPITAL_COMMUNITY): Payer: Self-pay

## 2021-03-20 ENCOUNTER — Other Ambulatory Visit: Payer: Self-pay

## 2021-03-20 DIAGNOSIS — R55 Syncope and collapse: Secondary | ICD-10-CM | POA: Diagnosis not present

## 2021-03-20 DIAGNOSIS — S0990XA Unspecified injury of head, initial encounter: Secondary | ICD-10-CM | POA: Insufficient documentation

## 2021-03-20 DIAGNOSIS — R4020 Unspecified coma: Secondary | ICD-10-CM | POA: Diagnosis not present

## 2021-03-20 DIAGNOSIS — Z1231 Encounter for screening mammogram for malignant neoplasm of breast: Secondary | ICD-10-CM | POA: Insufficient documentation

## 2021-04-14 DIAGNOSIS — E785 Hyperlipidemia, unspecified: Secondary | ICD-10-CM | POA: Diagnosis not present

## 2021-04-14 DIAGNOSIS — R4020 Unspecified coma: Secondary | ICD-10-CM | POA: Diagnosis not present

## 2021-04-14 DIAGNOSIS — I10 Essential (primary) hypertension: Secondary | ICD-10-CM | POA: Diagnosis not present

## 2021-04-15 LAB — CMP14+EGFR
ALT: 6 IU/L (ref 0–32)
AST: 12 IU/L (ref 0–40)
Albumin/Globulin Ratio: 1.2 (ref 1.2–2.2)
Albumin: 4.1 g/dL (ref 3.7–4.7)
Alkaline Phosphatase: 121 IU/L (ref 44–121)
BUN/Creatinine Ratio: 7 — ABNORMAL LOW (ref 12–28)
BUN: 8 mg/dL (ref 8–27)
Bilirubin Total: 0.7 mg/dL (ref 0.0–1.2)
CO2: 26 mmol/L (ref 20–29)
Calcium: 8.7 mg/dL (ref 8.7–10.3)
Chloride: 96 mmol/L (ref 96–106)
Creatinine, Ser: 1.15 mg/dL — ABNORMAL HIGH (ref 0.57–1.00)
Globulin, Total: 3.3 g/dL (ref 1.5–4.5)
Glucose: 125 mg/dL — ABNORMAL HIGH (ref 65–99)
Potassium: 3.4 mmol/L — ABNORMAL LOW (ref 3.5–5.2)
Sodium: 140 mmol/L (ref 134–144)
Total Protein: 7.4 g/dL (ref 6.0–8.5)
eGFR: 49 mL/min/{1.73_m2} — ABNORMAL LOW (ref >59)

## 2021-04-15 LAB — LIPID PANEL
Chol/HDL Ratio: 3.3 ratio (ref 0.0–4.4)
Cholesterol, Total: 163 mg/dL (ref 100–199)
HDL: 49 mg/dL (ref >39)
LDL Chol Calc (NIH): 93 mg/dL (ref 0–99)
Triglycerides: 120 mg/dL (ref 0–149)
VLDL Cholesterol Cal: 21 mg/dL (ref 5–40)

## 2021-04-20 ENCOUNTER — Encounter (INDEPENDENT_AMBULATORY_CARE_PROVIDER_SITE_OTHER): Payer: Self-pay

## 2021-04-20 ENCOUNTER — Other Ambulatory Visit: Payer: Self-pay

## 2021-04-20 ENCOUNTER — Encounter: Payer: Self-pay | Admitting: Family Medicine

## 2021-04-20 ENCOUNTER — Ambulatory Visit (INDEPENDENT_AMBULATORY_CARE_PROVIDER_SITE_OTHER): Payer: Medicare Other | Admitting: Family Medicine

## 2021-04-20 VITALS — BP 144/84 | HR 89 | Resp 20 | Ht 67.0 in | Wt 170.0 lb

## 2021-04-20 DIAGNOSIS — F17200 Nicotine dependence, unspecified, uncomplicated: Secondary | ICD-10-CM

## 2021-04-20 DIAGNOSIS — R7303 Prediabetes: Secondary | ICD-10-CM

## 2021-04-20 DIAGNOSIS — F17210 Nicotine dependence, cigarettes, uncomplicated: Secondary | ICD-10-CM | POA: Diagnosis not present

## 2021-04-20 DIAGNOSIS — Z23 Encounter for immunization: Secondary | ICD-10-CM

## 2021-04-20 DIAGNOSIS — E663 Overweight: Secondary | ICD-10-CM | POA: Diagnosis not present

## 2021-04-20 DIAGNOSIS — I10 Essential (primary) hypertension: Secondary | ICD-10-CM

## 2021-04-20 NOTE — Assessment & Plan Note (Signed)
Asked:confirms currently smokes cigarettes, 1 to 2/day Assess: Unwilling to set a quit date, but is cutting back Advise: needs to QUIT to reduce risk of cancer, cardio and cerebrovascular disease Assist: counseled for 5 minutes and literature provided Arrange: follow up in 2 to 4 months

## 2021-04-20 NOTE — Patient Instructions (Addendum)
F/u in office mid January, call if you need me before, please bring meter and medication to vist  Flu vaccine today  Keep Cardiology appointment  Non fast chem 7 and eGFr and HBa1C 3  to 5 days before January appointment   It is important that you exercise regularly at least 30 minutes 5 times a week. If you develop chest pain, have severe difficulty breathing, or feel very tired, stop exercising immediately and seek medical attention   Thanks for choosing [facility], we consider it a privelige to serve you.

## 2021-04-20 NOTE — Progress Notes (Signed)
[name]     MRN: [id number]      DOB: 23-Sep-1940   HPI [name] is here for follow up and re-evaluation of chronic medical conditions, medication management and review of any available recent lab and radiology data.  Preventive health is updated, specifically  Cancer screening and Immunization.   Questions or concerns regarding consultations or procedures which the PT has had in the interim are  addressed. The PT denies any adverse reactions to current medications since the last visit.  There are no new concerns.  There are no specific complaints   ROS Denies recent fever or chills. Denies sinus pressure, nasal congestion, ear pain or sore throat. Denies chest congestion, productive cough or wheezing. Denies chest pains, palpitations and leg swelling Denies abdominal pain, nausea, vomiting,diarrhea or constipation.   Denies dysuria, frequency, hesitancy or incontinence. Denies joint pain, swelling and limitation in mobility. Denies headaches, seizures, numbness, or tingling. Denies depression, anxiety or insomnia. Denies skin break down or rash.   PE  BP (!) 144/84   Pulse 89   Resp 20   Ht 5\' 7"  (1.702 m)   Wt 170 lb (77.1 kg)   SpO2 93%   BMI 26.63 kg/m   Patient alert and oriented and in no cardiopulmonary distress.  HEENT: No facial asymmetry, EOMI,     Neck supple .  Chest: Clear to auscultation bilaterally.  CVS: S1, S2 no murmurs, no S3.Regular rate.  ABD: Soft non tender.   Ext: No edema  MS: Adequate  though reduced ROM spine, shoulders, hips and knees.  Skin: Intact, no ulcerations or rash noted.  Psych: Good eye contact, normal affect. Memory intact not anxious or depressed appearing.  CNS: CN 2-12 intact, power,  normal throughout.no focal deficits noted.   Assessment & Plan  Essential hypertension Improved and tolerating current regime, contimnue same DASH diet and commitment to daily physical activity for a minimum of 30 minutes  discussed and encouraged, as a part of hypertension management. The importance of attaining a healthy weight is also discussed.  BP/Weight 04/20/2021 03/07/2021 02/13/2021 01/19/2021 12/14/2020 10/14/2020 9/89/2119  Systolic BP 417 408 144 818 563 149 702  Diastolic BP 84 84 637 81 80 95 99  Wt. (Lbs) 170 170.12 175 179 180.8 - 180.2  BMI 26.63 26.64 27.41 28.04 28.32 - 28.22     Has upcoming Card eval  Prediabetes Patient educated about the importance of limiting  Carbohydrate intake , the need to commit to daily physical activity for a minimum of 30 minutes , and to commit weight loss. The fact that changes in all these areas will reduce or eliminate all together the development of diabetes is stressed.  Updated lab needed at/ before next visit.   Diabetic Labs Latest Ref Rng & Units 04/14/2021 01/19/2021 04/04/2020 09/03/2019 04/28/2019  HbA1c 4.8 - 5.6 % - 6.0(H) - - -  Microalbumin 0.00 - 1.89 mg/dL - - - - -  Micro/Creat Ratio 0.0 - 30.0 mg/g - - - - -  Chol 100 - 199 mg/dL 163 259(H) - 167 240(H)  HDL >39 mg/dL 49 48 - 52 50  Calc LDL 0 - 99 mg/dL 93 178(H) - 97 161(H)  Triglycerides 0 - 149 mg/dL 120 177(H) - 101 158(H)  Creatinine 0.57 - 1.00 mg/dL 1.15(H) 1.24(H) 1.11(H) 1.14(H) 1.27(H)   BP/Weight 04/20/2021 03/07/2021 02/13/2021 01/19/2021 12/14/2020 10/14/2020 8/58/8502  Systolic BP 774 128 786 767 209 470 962  Diastolic BP 84 84 836  81 80 95 99  Wt. (Lbs) 170 170.12 175 179 180.8 - 180.2  BMI 26.63 26.64 27.41 28.04 28.32 - 28.22   Foot/eye exam completion dates 05/02/2010  Foot exam Order yes  Foot Form Completion -      Overweight (BMI 25.0-29.9)  Patient re-educated about  the importance of commitment to a  minimum of 150 minutes of exercise per week as able.  The importance of healthy food choices with portion control discussed, as well as eating regularly and within a 12 hour window most days. The need to choose "clean , green" food 50 to 75% of the time is discussed,  as well as to make water the primary drink and set a goal of 64 ounces water daily.    Weight /BMI 04/20/2021 03/07/2021 02/13/2021  WEIGHT 170 lb 170 lb 1.9 oz 175 lb  HEIGHT 5\' 7"  5\' 7"  5\' 7"   BMI 26.63 kg/m2 26.64 kg/m2 27.41 kg/m2      NICOTINE ADDICTION Asked:confirms currently smokes cigarettes, 1 to 2/day Assess: Unwilling to set a quit date, but is cutting back Advise: needs to QUIT to reduce risk of cancer, cardio and cerebrovascular disease Assist: counseled for 5 minutes and literature provided Arrange: follow up in 2 to 4 months

## 2021-04-20 NOTE — Assessment & Plan Note (Signed)
  Patient re-educated about  the importance of commitment to a  minimum of 150 minutes of exercise per week as able.  The importance of healthy food choices with portion control discussed, as well as eating regularly and within a 12 hour window most days. The need to choose "clean , green" food 50 to 75% of the time is discussed, as well as to make water the primary drink and set a goal of 64 ounces water daily.    Weight /BMI 04/20/2021 03/07/2021 02/13/2021  WEIGHT 170 lb 170 lb 1.9 oz 175 lb  HEIGHT 5\' 7"  5\' 7"  5\' 7"   BMI 26.63 kg/m2 26.64 kg/m2 27.41 kg/m2

## 2021-04-20 NOTE — Assessment & Plan Note (Signed)
Improved and tolerating current regime, contimnue same DASH diet and commitment to daily physical activity for a minimum of 30 minutes discussed and encouraged, as a part of hypertension management. The importance of attaining a healthy weight is also discussed.  BP/Weight 04/20/2021 03/07/2021 02/13/2021 01/19/2021 12/14/2020 10/14/2020 7/62/2633  Systolic BP 354 562 563 893 734 287 681  Diastolic BP 84 84 157 81 80 95 99  Wt. (Lbs) 170 170.12 175 179 180.8 - 180.2  BMI 26.63 26.64 27.41 28.04 28.32 - 28.22     Has upcoming Card eval

## 2021-04-20 NOTE — Assessment & Plan Note (Signed)
Patient educated about the importance of limiting  Carbohydrate intake , the need to commit to daily physical activity for a minimum of 30 minutes , and to commit weight loss. The fact that changes in all these areas will reduce or eliminate all together the development of diabetes is stressed.  Updated lab needed at/ before next visit.   Diabetic Labs Latest Ref Rng & Units 04/14/2021 01/19/2021 04/04/2020 09/03/2019 04/28/2019  HbA1c 4.8 - 5.6 % - 6.0(H) - - -  Microalbumin 0.00 - 1.89 mg/dL - - - - -  Micro/Creat Ratio 0.0 - 30.0 mg/g - - - - -  Chol 100 - 199 mg/dL 163 259(H) - 167 240(H)  HDL >39 mg/dL 49 48 - 52 50  Calc LDL 0 - 99 mg/dL 93 178(H) - 97 161(H)  Triglycerides 0 - 149 mg/dL 120 177(H) - 101 158(H)  Creatinine 0.57 - 1.00 mg/dL 1.15(H) 1.24(H) 1.11(H) 1.14(H) 1.27(H)   BP/Weight 04/20/2021 03/07/2021 02/13/2021 01/19/2021 12/14/2020 10/14/2020 11/28/3265  Systolic BP 124 580 998 338 250 539 767  Diastolic BP 84 84 341 81 80 95 99  Wt. (Lbs) 170 170.12 175 179 180.8 - 180.2  BMI 26.63 26.64 27.41 28.04 28.32 - 28.22   Foot/eye exam completion dates 05/02/2010  Foot exam Order yes  Foot Form Completion -

## 2021-05-01 ENCOUNTER — Other Ambulatory Visit: Payer: Self-pay | Admitting: Family Medicine

## 2021-05-17 ENCOUNTER — Ambulatory Visit: Payer: Medicare Other | Admitting: Cardiology

## 2021-06-07 ENCOUNTER — Ambulatory Visit (HOSPITAL_COMMUNITY)
Admission: RE | Admit: 2021-06-07 | Discharge: 2021-06-07 | Disposition: A | Discharge status: Home / Self Care | Payer: Medicare Other | Source: Ambulatory Visit | Attending: Family Medicine | Admitting: Family Medicine

## 2021-06-07 ENCOUNTER — Ambulatory Visit (INDEPENDENT_AMBULATORY_CARE_PROVIDER_SITE_OTHER): Payer: Medicare Other | Admitting: Family Medicine

## 2021-06-07 ENCOUNTER — Encounter: Payer: Self-pay | Admitting: Family Medicine

## 2021-06-07 ENCOUNTER — Other Ambulatory Visit: Payer: Self-pay

## 2021-06-07 ENCOUNTER — Other Ambulatory Visit (HOSPITAL_COMMUNITY)
Admission: RE | Admit: 2021-06-07 | Discharge: 2021-06-07 | Disposition: A | Discharge status: Home / Self Care | Payer: Medicare Other | Source: Ambulatory Visit | Attending: Family Medicine | Admitting: Family Medicine

## 2021-06-07 VITALS — BP 132/80

## 2021-06-07 DIAGNOSIS — J329 Chronic sinusitis, unspecified: Secondary | ICD-10-CM

## 2021-06-07 DIAGNOSIS — N760 Acute vaginitis: Secondary | ICD-10-CM | POA: Insufficient documentation

## 2021-06-07 DIAGNOSIS — R350 Frequency of micturition: Secondary | ICD-10-CM

## 2021-06-07 DIAGNOSIS — R053 Chronic cough: Secondary | ICD-10-CM | POA: Diagnosis not present

## 2021-06-07 DIAGNOSIS — N898 Other specified noninflammatory disorders of vagina: Secondary | ICD-10-CM | POA: Insufficient documentation

## 2021-06-07 DIAGNOSIS — R059 Cough, unspecified: Secondary | ICD-10-CM | POA: Diagnosis not present

## 2021-06-07 LAB — POCT URINALYSIS DIP (CLINITEK)
Bilirubin, UA: NEGATIVE
Blood, UA: NEGATIVE
Glucose, UA: NEGATIVE mg/dL
Ketones, POC UA: NEGATIVE mg/dL
Leukocytes, UA: NEGATIVE
Nitrite, UA: NEGATIVE
POC PROTEIN,UA: NEGATIVE
Spec Grav, UA: 1.015 (ref 1.010–1.025)
Urobilinogen, UA: 0.2 E.U./dL
pH, UA: 7 (ref 5.0–8.0)

## 2021-06-07 MED ORDER — BENZONATATE 100 MG PO CAPS
100.0000 mg | ORAL_CAPSULE | Freq: Two times a day (BID) | ORAL | 0 refills | Status: DC | PRN
Start: 2021-06-07 — End: 2021-08-01

## 2021-06-07 MED ORDER — SULFAMETHOXAZOLE-TRIMETHOPRIM 800-160 MG PO TABS: 1.0000 | ORAL_TABLET | Freq: Two times a day (BID) | ORAL | 0 refills | Status: DC

## 2021-06-07 MED ORDER — NYSTATIN 100000 UNIT/GM EX CREA: 1.0000 "application " | TOPICAL_CREAM | Freq: Two times a day (BID) | CUTANEOUS | 0 refills | Status: DC

## 2021-06-07 NOTE — Progress Notes (Signed)
Virtual Visit via Telephone Note  I connected with YEILIN ZWEBER on 06/07/21 at 11:20 AM EST by telephone and verified that I am speaking with the correct person using two identifiers.  Location: Patient: HOME  Provider: OFFICE   I discussed the limitations, risks, security and privacy concerns of performing an evaluation and management service by telephone and the availability of in person appointments. I also discussed with the patient that there may be a patient responsible charge related to this service. The patient expressed understanding and agreed to proceed.   History of Present Illness: 3 week h/o sinus pressure with foul smelling drainage, intermittent chills, no documented fever C/o cough x 3 weeks, sptum is at times thick and yellow also 1 week h/o ,malodorous urine, and also c/o vaginal itching   Observations/Objective: BP 132/80  Good communication with no confusion and intact memory. Alert and oriented x 3 No signs of respiratory distress during speech   Assessment and Plan: Chronic cough CXR and 1 week course of septra  Chronic sinusitis Septra prescribed x 1 week  Urinary frequency CCUA collected and is normal  Vaginal itching Wet prep to be checked   Follow Up Instructions:    I discussed the assessment and treatment plan with the patient. The patient was provided an opportunity to ask questions and all were answered. The patient agreed with the plan and demonstrated an understanding of the instructions.   The patient was advised to call back or seek an in-person evaluation if the symptoms worsen or if the condition fails to improve as anticipated.  I provided  12 minutes of non-face-to-face time during this encounter.   [name], MD

## 2021-06-07 NOTE — Patient Instructions (Addendum)
KEEP JANUARY APPOINTMENT AS BEFORE, CALL IF YOU NEED ME SOONER  Please get CXR today  Come in to the office this afternoon to submit cCUA and c/S if needed, also wet prep  Septra and tessalon perles are prescribed for cough and possible UTI and vaginal cream for itch/ yeast infection  Please continue to work on and committing to stopping smoking, call [phone] for help  Thanks for choosing [facility], we consider it a privelige to serve you.

## 2021-06-09 LAB — CERVICOVAGINAL ANCILLARY ONLY
Bacterial Vaginitis (gardnerella): NEGATIVE
Candida Glabrata: NEGATIVE
Candida Vaginitis: NEGATIVE
Comment: NEGATIVE
Comment: NEGATIVE
Comment: NEGATIVE

## 2021-06-12 ENCOUNTER — Encounter: Payer: Self-pay | Admitting: Family Medicine

## 2021-06-12 DIAGNOSIS — R053 Chronic cough: Secondary | ICD-10-CM | POA: Insufficient documentation

## 2021-06-12 DIAGNOSIS — J329 Chronic sinusitis, unspecified: Secondary | ICD-10-CM | POA: Insufficient documentation

## 2021-06-12 NOTE — Assessment & Plan Note (Signed)
CXR and 1 week course of septra

## 2021-06-12 NOTE — Assessment & Plan Note (Signed)
Wet prep to be checked

## 2021-06-12 NOTE — Assessment & Plan Note (Signed)
CCUA collected and is normal

## 2021-06-12 NOTE — Assessment & Plan Note (Signed)
Septra prescribed x 1 week

## 2021-06-26 ENCOUNTER — Other Ambulatory Visit: Payer: Self-pay | Admitting: Family Medicine

## 2021-07-04 ENCOUNTER — Ambulatory Visit (INDEPENDENT_AMBULATORY_CARE_PROVIDER_SITE_OTHER): Payer: Medicare Other | Admitting: Internal Medicine

## 2021-07-04 ENCOUNTER — Encounter: Payer: Self-pay | Admitting: Internal Medicine

## 2021-07-04 ENCOUNTER — Other Ambulatory Visit: Payer: Self-pay

## 2021-07-04 VITALS — BP 186/86 | HR 88 | Ht 67.0 in | Wt 170.8 lb

## 2021-07-04 DIAGNOSIS — I2510 Atherosclerotic heart disease of native coronary artery without angina pectoris: Secondary | ICD-10-CM | POA: Diagnosis not present

## 2021-07-04 MED ORDER — ROSUVASTATIN CALCIUM 40 MG PO TABS: 40.0000 mg | ORAL_TABLET | Freq: Every day | ORAL | 3 refills | Status: DC

## 2021-07-04 NOTE — Patient Instructions (Signed)
Medication Instructions:  Your physician has recommended you make the following change in your medication:  INCREASE Crestor to 40 mg tablets daily   *If you need a refill on your cardiac medications before your next appointment, please call your pharmacy*   Lab Work: None If you have labs (blood work) drawn today and your tests are completely normal, you will receive your results only by: [place] (if you have MyChart) OR A paper copy in the mail If you have any lab test that is abnormal or we need to change your treatment, we will call you to review the results.   Testing/Procedures: None   Follow-Up: At [facility], you and your health needs are our priority.  As part of our continuing mission to provide you with exceptional heart care, we have created designated Provider Care Teams.  These Care Teams include your primary Cardiologist (physician) and Advanced Practice Providers (APPs -  Physician Assistants and Nurse Practitioners) who all work together to provide you with the care you need, when you need it.  We recommend signing up for the patient portal called "MyChart".  Sign up information is provided on this After Visit Summary.  MyChart is used to connect with patients for Virtual Visits (Telemedicine).  Patients are able to view lab/test results, encounter notes, upcoming appointments, etc.  Non-urgent messages can be sent to your provider as well.   To learn more about what you can do with MyChart, go to [email or web address].    Your next appointment:   1 year(s)  The format for your next appointment:   In Person  Provider:   Dorris Carnes, MD    Other Instructions

## 2021-07-04 NOTE — Progress Notes (Signed)
Cardiology Office Note   Date:  07/04/2021   ID:  [name], DOB 1940/12/16, MRN [id number]  PCP:  Fayrene Helper, MD  Cardiologist:   Dorris Carnes, MD   Previous [facility]   Pt presents for cardiac care   Last seen in cilinic in 2015    History of Present Illness: [name] is a 80 y.o. female   with hx of CAD   Pt seen in 2015  Myoview showed anterior ischemia   Cardiac cath in Sept 2015 showe: 50 to 70% D1; 40 to 50% RCA. Nothinig appeared flow limiting    Echo showed LVEF 55 to 60% with mod LVH   therapy/risk factor modification. It was felt findings on nuclear scan may represent attenuation artifact.   The pt follows with Bari Mantis.   She denies CP   Says her breathing is OK   No dizziness   No syncope   No palptitations   Patient says her BP is usually good at home   WOnders if due to   Current Meds  Medication Sig   ALOE VERA PO Take 500 mg by mouth daily as needed (stomach).    amLODipine (NORVASC) 5 MG tablet TAKE 1 TABLET(5 MG) BY MOUTH DAILY   aspirin EC 81 MG tablet Take 81 mg by mouth daily.   Calcium Carb-Cholecalciferol (CALCIUM 600/VITAMIN D3) 600-800 MG-UNIT TABS Take 1 tablet by mouth daily.   fluticasone (FLONASE) 50 MCG/ACT nasal spray SHAKE LIQUID AND USE 2 SPRAYS IN EACH NOSTRIL DAILY AS NEEDED FOR ALLERGIES OR RHINITIS   Magnesium Hydroxide (MILK OF MAGNESIA PO) Take by mouth as needed. 1 capful as needed   Multiple Vitamin (MULTIVITAMIN) tablet Take 2 tablets by mouth daily.    nystatin cream (MYCOSTATIN) Apply 1 application topically 2 (two) times daily.   Omega 3 1000 MG CAPS Take 1 capsule by mouth daily.   pantoprazole (PROTONIX) 40 MG tablet Take 1 tablet (40 mg total) by mouth daily before breakfast.   rosuvastatin (CRESTOR) 10 MG tablet Take 1 tablet (10 mg total) by mouth daily.   triamterene-hydrochlorothiazide (MAXZIDE) 75-50 MG tablet Take half tablet in the morning at 10 am and half every evening at 10 pm     Allergies:    Patient has no known allergies.   Past Medical History:  Diagnosis Date   COPD (chronic obstructive pulmonary disease) ([place])    Hiatal hernia without gangrene and obstruction 03/23/2015   Hypertension 2004   Lung nodule seen on imaging study    Nicotine addiction    OA (osteoarthritis) of knee    Obesity    Sinusitis     Past Surgical History:  Procedure Laterality Date   bilateral cataract surgery  7/27 & 03/01/09   Dr. Gershon Crane     cholecystectomy     COLONOSCOPY  04/2004   Dr. Leane Para Smith-->melanosis coli   COLONOSCOPY WITH ESOPHAGOGASTRODUODENOSCOPY (EGD) N/A 02/25/2013   Dr. Fields:Normal mucosa in the terminal ileum/Severe melanosis throughout the entire examined colon/ONE COLON POLYP REMOVED (tubular adenoma)/Mild diverticulosis  in the sigmoid colon/EGD:The mucosa of the esophagus appeared normal/Non-erosive gastritis, empiric dilation with Savary dilator   ESOPHAGOGASTRODUODENOSCOPY  11/07/2010   Jenkins:hiatal hernia/no evidence of Barrett esophagitis.  The Z-line was noted to be at 39 cm from the teeth. CLO test negative   ESOPHAGOGASTRODUODENOSCOPY N/A 09/11/2013   Dr.Fields-  incomplete esophageal web in mid-esophagus, medium sized hialtal hernia, PUD. bx=focal erosion with inflammation and fibrosis   ESOPHAGOGASTRODUODENOSCOPY  N/A 07/06/2014   healed ulcers, persistent erosions on aspirin, small hiatal hernia   ESOPHAGOGASTRODUODENOSCOPY N/A 02/05/2020   normal esophagus s/p dilation, normal stomach, normal duodenal bulb.    knee arthroscopy right     LEFT HEART CATHETERIZATION WITH CORONARY ANGIOGRAM N/A 04/08/2014   Procedure: LEFT HEART CATHETERIZATION WITH CORONARY ANGIOGRAM;  Surgeon: Sinclair Grooms, MD;  Location: [facility] CATH LAB;  Service: Cardiovascular;  Laterality: N/A;   MALONEY DILATION N/A 02/25/2013   Procedure: Venia Minks DILATION;  Surgeon: Danie Binder, MD;  Location: [facility] ENDO SU[facility];  Service: Endoscopy;  Laterality: N/A;   PARTIAL HYSTERECTOMY     SAVORY  DILATION N/A 02/25/2013   Procedure: SAVORY DILATION;  Surgeon: Danie Binder, MD;  Location: [facility] ENDO [facility];  Service: Endoscopy;  Laterality: N/A;   total knee arthroplasty right  05/29/2005   Dr. Aline Brochure      Social History:  The patient  reports that she has been smoking cigarettes. She started smoking about 58 years ago. She has a 1.00 pack-year smoking history. She has never used smokeless tobacco. She reports that she does not drink alcohol and does not use drugs.   Family History:  The patient's family history includes Coronary artery disease in her mother; Diabetes in her brother and mother; Hypertension in her brother, brother, mother, sister, and sister; Pneumonia in her father.    ROS:  Please see the history of present illness. All other systems are reviewed and  Negative to the above problem except as noted.    PHYSICAL EXAM: VS:  BP (!) 186/86 (BP Location: Right Arm, Patient Position: Sitting, Cuff Size: Normal)    Pulse 88    Ht 5\' 7"  (1.702 m)    Wt 170 lb 12.8 oz (77.5 kg)    SpO2 98%    BMI 26.75 kg/m   GEN: Well nourished, well developed, in no acute distress  HEENT: normal  Neck: no JVD, carotid bruits Cardiac: RRR; no murmurs.  NO LE edema  Respiratory:  clear to auscultation bilaterally, GI: soft, nontender, nondistended, + BS  No hepatomegaly  MS: no deformity Moving all extremities   Skin: warm and dry, no rash Neuro:  Strength and sensation are intact Psych: euthymic mood, full affect   EKG:  EKG is not  ordered today.   Lipid Panel    Component Value Date/Time   CHOL 163 04/14/2021 0831   TRIG 120 04/14/2021 0831   HDL 49 04/14/2021 0831   CHOLHDL 3.3 04/14/2021 0831   CHOLHDL 4.1 07/04/2016 1204   VLDL 23 07/04/2016 1204   LDLCALC 93 04/14/2021 0831      Wt Readings from Last 3 Encounters:  07/04/21 170 lb 12.8 oz (77.5 kg)  04/20/21 170 lb (77.1 kg)  03/07/21 170 lb 1.9 oz (77.2 kg)      ASSESSMENT AND PLAN:  1  CAD   PT with cath in  2015 that showed moderate CAD    SHe remains asymtpomatic   Is active     Keep on same meds    2HTN   BP is high today   She says it is usually much better   Shes's anxious   Did take meds    Note in Nov and Sept the readings are much better    She has appt with Bari Mantis soon   Will recheck  3  HL   Liopids need tighter control   I would recomm increasing Crestor to 40 mg  Goal for LDL below 70 at least   Check lipids 8 wks      4  Tob   COunselled on cessation  5  Diet   Reviewed   Recom 24 g Sugar; limit processed food   F/U in 1 year  Sooner for problems   Current medicines are reviewed at length with the patient today.  The patient does not have concerns regarding medicines.  Signed, Dorris Carnes, MD  07/04/2021 3:24 PM    [facility] [address], [place], [place]  [address] Phone: [phone]; Fax: [phone]

## 2021-07-06 ENCOUNTER — Ambulatory Visit: Payer: Medicare Other | Admitting: Cardiology

## 2021-08-01 ENCOUNTER — Encounter: Payer: Self-pay | Admitting: Family Medicine

## 2021-08-01 ENCOUNTER — Other Ambulatory Visit: Payer: Self-pay

## 2021-08-01 ENCOUNTER — Ambulatory Visit (INDEPENDENT_AMBULATORY_CARE_PROVIDER_SITE_OTHER): Payer: Medicare Other | Admitting: Family Medicine

## 2021-08-01 VITALS — BP 182/81 | HR 96 | Ht 67.0 in | Wt 166.1 lb

## 2021-08-01 DIAGNOSIS — E785 Hyperlipidemia, unspecified: Secondary | ICD-10-CM

## 2021-08-01 DIAGNOSIS — I10 Essential (primary) hypertension: Secondary | ICD-10-CM | POA: Diagnosis not present

## 2021-08-01 DIAGNOSIS — E559 Vitamin D deficiency, unspecified: Secondary | ICD-10-CM

## 2021-08-01 DIAGNOSIS — Z6826 Body mass index (BMI) 26.0-26.9, adult: Secondary | ICD-10-CM

## 2021-08-01 DIAGNOSIS — R7303 Prediabetes: Secondary | ICD-10-CM | POA: Diagnosis not present

## 2021-08-01 DIAGNOSIS — E663 Overweight: Secondary | ICD-10-CM

## 2021-08-01 DIAGNOSIS — K219 Gastro-esophageal reflux disease without esophagitis: Secondary | ICD-10-CM

## 2021-08-01 DIAGNOSIS — F17200 Nicotine dependence, unspecified, uncomplicated: Secondary | ICD-10-CM

## 2021-08-01 MED ORDER — AMLODIPINE BESYLATE 5 MG PO TABS: ORAL_TABLET | ORAL | 3 refills | Status: DC

## 2021-08-01 NOTE — Patient Instructions (Signed)
F.U in 6 weeks, call if you need me sooner ° °New higher dose of amlodipine is 5 mg take one tablet 2 times daily ° °New dose of crestor is 40 mg daily, stop 10 mg tablet ° °CBC, cmp and eGFr , hBa1C and vit D today ° °Please work on quitting smoking altogether, this will help to protect your hart and your breathing, now at 5 /day ° °Best for 2023!! °

## 2021-08-02 LAB — CMP14+EGFR
ALT: 7 IU/L (ref 0–32)
AST: 14 IU/L (ref 0–40)
Albumin/Globulin Ratio: 1.3 (ref 1.2–2.2)
Albumin: 4.4 g/dL (ref 3.7–4.7)
Alkaline Phosphatase: 126 IU/L — ABNORMAL HIGH (ref 44–121)
BUN/Creatinine Ratio: 9 — ABNORMAL LOW (ref 12–28)
BUN: 11 mg/dL (ref 8–27)
Bilirubin Total: 0.4 mg/dL (ref 0.0–1.2)
CO2: 27 mmol/L (ref 20–29)
Calcium: 9.5 mg/dL (ref 8.7–10.3)
Chloride: 95 mmol/L — ABNORMAL LOW (ref 96–106)
Creatinine, Ser: 1.19 mg/dL — ABNORMAL HIGH (ref 0.57–1.00)
Globulin, Total: 3.3 g/dL (ref 1.5–4.5)
Glucose: 99 mg/dL (ref 70–99)
Potassium: 4 mmol/L (ref 3.5–5.2)
Sodium: 141 mmol/L (ref 134–144)
Total Protein: 7.7 g/dL (ref 6.0–8.5)
eGFR: 47 mL/min/{1.73_m2} — ABNORMAL LOW (ref >59)

## 2021-08-02 LAB — HEMOGLOBIN A1C
Est. average glucose Bld gHb Est-mCnc: 120 mg/dL
Hgb A1c MFr Bld: 5.8 % — ABNORMAL HIGH (ref 4.8–5.6)

## 2021-08-02 LAB — VITAMIN D 25 HYDROXY (VIT D DEFICIENCY, FRACTURES): Vit D, 25-Hydroxy: 38.8 ng/mL (ref 30.0–100.0)

## 2021-08-07 ENCOUNTER — Encounter: Payer: Self-pay | Admitting: Family Medicine

## 2021-08-07 NOTE — Assessment & Plan Note (Signed)
Asked:confirms currently smokes cigarettes 2/day Assess: Unwilling to set a quit date, but is cutting back and wants to quit Advise: needs to QUIT to reduce risk of cancer, cardio and cerebrovascular disease Assist: counseled for 5 minutes and literature provided Arrange: follow up in 2 to 4 months

## 2021-08-07 NOTE — Assessment & Plan Note (Signed)
Hyperlipidemia:Low fat diet discussed and encouraged.   Lipid Panel  Lab Results  Component Value Date   CHOL 163 04/14/2021   HDL 49 04/14/2021   LDLCALC 93 04/14/2021   TRIG 120 04/14/2021   CHOLHDL 3.3 04/14/2021   Updated lab needed at/ before next visit.

## 2021-08-07 NOTE — Assessment & Plan Note (Signed)
°  Patient re-educated about  the importance of commitment to a  minimum of 150 minutes of exercise per week as able.  The importance of healthy food choices with portion control discussed, as well as eating regularly and within a 12 hour window most days. The need to choose "clean , green" food 50 to 75% of the time is discussed, as well as to make water the primary drink and set a goal of 64 ounces water daily.    Weight /BMI 08/01/2021 07/04/2021 04/20/2021  WEIGHT 166 lb 1.9 oz 170 lb 12.8 oz 170 lb  HEIGHT 5\' 7"  5\' 7"  5\' 7"   BMI 26.02 kg/m2 26.75 kg/m2 26.63 kg/m2

## 2021-08-07 NOTE — Assessment & Plan Note (Signed)
Patient educated about the importance of limiting  Carbohydrate intake , the need to commit to daily physical activity for a minimum of 30 minutes , and to commit weight loss. The fact that changes in all these areas will reduce or eliminate all together the development of diabetes is stressed.  Improved  Diabetic Labs Latest Ref Rng & Units 08/01/2021 04/14/2021 01/19/2021 04/04/2020 09/03/2019  HbA1c 4.8 - 5.6 % 5.8(H) - 6.0(H) - -  Microalbumin 0.00 - 1.89 mg/dL - - - - -  Micro/Creat Ratio 0.0 - 30.0 mg/g - - - - -  Chol 100 - 199 mg/dL - 163 259(H) - 167  HDL >39 mg/dL - 49 48 - 52  Calc LDL 0 - 99 mg/dL - 93 178(H) - 97  Triglycerides 0 - 149 mg/dL - 120 177(H) - 101  Creatinine 0.57 - 1.00 mg/dL 1.19(H) 1.15(H) 1.24(H) 1.11(H) 1.14(H)   BP/Weight 08/01/2021 07/04/2021 06/07/2021 04/20/2021 03/07/2021 02/13/2021 1/47/8295  Systolic BP 621 308 657 846 962 952 841  Diastolic BP 81 86 80 84 84 100 81  Wt. (Lbs) 166.12 170.8 - 170 170.12 175 179  BMI 26.02 26.75 - 26.63 26.64 27.41 28.04   Foot/eye exam completion dates 05/02/2010  Foot exam Order yes  Foot Form Completion -

## 2021-08-07 NOTE — Progress Notes (Signed)
[name]     MRN: [id number]      DOB: [birth year]   HPI [name] is here for follow up and re-evaluation of chronic medical conditions, medication management and review of any available recent lab and radiology data.  Preventive health is updated, specifically  Cancer screening and Immunization.   Questions or concerns regarding consultations or procedures which the PT has had in the interim are  addressed. The PT denies any adverse reactions to current medications since the last visit.  There are no new concerns.  There are no specific complaints   ROS Denies recent fever or chills. Denies sinus pressure, nasal congestion, ear pain or sore throat. Denies chest congestion, productive cough or wheezing. Denies chest pains, palpitations and leg swelling Denies abdominal pain, nausea, vomiting,diarrhea or constipation.   Denies dysuria, frequency, hesitancy or incontinence. Denies joint pain, swelling and limitation in mobility. Denies headaches, seizures, numbness, or tingling. Denies depression, anxiety or insomnia. Denies skin break down or rash.   PE  BP (!) 182/81    Pulse 96    Ht 5\' 7"  (1.702 m)    Wt 166 lb 1.9 oz (75.4 kg)    SpO2 95%    BMI 26.02 kg/m   Patient alert and oriented and in no cardiopulmonary distress.  HEENT: No facial asymmetry, EOMI,     Neck supple .  Chest: Clear to auscultation bilaterally.  CVS: S1, S2 no murmurs, no S3.Regular rate.  ABD: Soft non tender.   Ext: No edema  MS: decreased  ROM spine, shoulders, hips and knees.  Skin: Intact, no ulcerations or rash noted.  Psych: Good eye contact, normal affect. Memory intact not anxious or depressed appearing.  CNS: CN 2-12 intact, power,  normal throughout.no focal deficits noted.   Assessment & Plan  Essential hypertension Uncontrolled, inc amlodipine dose and re eval in 6 to 8 weeks DASH diet and commitment to daily physical activity for a minimum of 30 minutes discussed and  encouraged, as a part of hypertension management. The importance of attaining a healthy weight is also discussed.  BP/Weight 08/01/2021 07/04/2021 06/07/2021 04/20/2021 03/07/2021 02/13/2021 5/68/1275  Systolic BP 170 017 494 496 759 163 846  Diastolic BP 81 86 80 84 84 100 81  Wt. (Lbs) 166.12 170.8 - 170 170.12 175 179  BMI 26.02 26.75 - 26.63 26.64 27.41 28.04       NICOTINE ADDICTION Asked:confirms currently smokes cigarettes 2/day Assess: Unwilling to set a quit date, but is cutting back and wants to quit Advise: needs to QUIT to reduce risk of cancer, cardio and cerebrovascular disease Assist: counseled for 5 minutes and literature provided Arrange: follow up in 2 to 4 months   Overweight (BMI 25.0-29.9)  Patient re-educated about  the importance of commitment to a  minimum of 150 minutes of exercise per week as able.  The importance of healthy food choices with portion control discussed, as well as eating regularly and within a 12 hour window most days. The need to choose "clean , green" food 50 to 75% of the time is discussed, as well as to make water the primary drink and set a goal of 64 ounces water daily.    Weight /BMI 08/01/2021 07/04/2021 04/20/2021  WEIGHT 166 lb 1.9 oz 170 lb 12.8 oz 170 lb  HEIGHT 5\' 7"  5\' 7"  5\' 7"   BMI 26.02 kg/m2 26.75 kg/m2 26.63 kg/m2      Prediabetes Patient educated about the importance of limiting  Carbohydrate intake , the need to commit to daily physical activity for a minimum of 30 minutes , and to commit weight loss. The fact that changes in all these areas will reduce or eliminate all together the development of diabetes is stressed.  Improved  Diabetic Labs Latest Ref Rng & Units 08/01/2021 04/14/2021 01/19/2021 04/04/2020 09/03/2019  HbA1c 4.8 - 5.6 % 5.8(H) - 6.0(H) - -  Microalbumin 0.00 - 1.89 mg/dL - - - - -  Micro/Creat Ratio 0.0 - 30.0 mg/g - - - - -  Chol 100 - 199 mg/dL - 163 259(H) - 167  HDL >39 mg/dL - 49 48 - 52  Calc  LDL 0 - 99 mg/dL - 93 178(H) - 97  Triglycerides 0 - 149 mg/dL - 120 177(H) - 101  Creatinine 0.57 - 1.00 mg/dL 1.19(H) 1.15(H) 1.24(H) 1.11(H) 1.14(H)   BP/Weight 08/01/2021 07/04/2021 06/07/2021 04/20/2021 03/07/2021 02/13/2021 6/57/8469  Systolic BP 629 528 413 244 010 272 536  Diastolic BP 81 86 80 84 84 100 81  Wt. (Lbs) 166.12 170.8 - 170 170.12 175 179  BMI 26.02 26.75 - 26.63 26.64 27.41 28.04   Foot/eye exam completion dates 05/02/2010  Foot exam Order yes  Foot Form Completion -      GERD (gastroesophageal reflux disease) Controlled, no change in medication   Dyslipidemia (high LDL; low HDL) Hyperlipidemia:Low fat diet discussed and encouraged.   Lipid Panel  Lab Results  Component Value Date   CHOL 163 04/14/2021   HDL 49 04/14/2021   LDLCALC 93 04/14/2021   TRIG 120 04/14/2021   CHOLHDL 3.3 04/14/2021   Updated lab needed at/ before next visit.

## 2021-08-07 NOTE — Assessment & Plan Note (Signed)
Controlled, no change in medication  

## 2021-08-07 NOTE — Assessment & Plan Note (Signed)
Uncontrolled, inc amlodipine dose and re eval in 6 to 8 weeks DASH diet and commitment to daily physical activity for a minimum of 30 minutes discussed and encouraged, as a part of hypertension management. The importance of attaining a healthy weight is also discussed.  BP/Weight 08/01/2021 07/04/2021 06/07/2021 04/20/2021 03/07/2021 02/13/2021 2/90/3795  Systolic BP 583 167 425 525 894 834 758  Diastolic BP 81 86 80 84 84 100 81  Wt. (Lbs) 166.12 170.8 - 170 170.12 175 179  BMI 26.02 26.75 - 26.63 26.64 27.41 28.04

## 2021-08-22 ENCOUNTER — Telehealth: Payer: Self-pay | Admitting: Family Medicine

## 2021-08-22 NOTE — Telephone Encounter (Signed)
Pt is calling to state that the medication she is taking is making her lose her appetite and no energy  Amlodipine & triamterene  Last night 945 t 141/81 left arm right 163/89   155 158/94 left  178/94 right   745 in the am  140/73 left 168/95 right   12 143/87 left  150/91 right

## 2021-08-23 NOTE — Telephone Encounter (Signed)
Please advise any changes

## 2021-08-24 ENCOUNTER — Other Ambulatory Visit (HOSPITAL_COMMUNITY)
Admission: RE | Admit: 2021-08-24 | Discharge: 2021-08-24 | Disposition: A | Discharge status: Home / Self Care | Payer: Medicare Other | Source: Ambulatory Visit | Attending: Family Medicine | Admitting: Family Medicine

## 2021-08-24 ENCOUNTER — Other Ambulatory Visit: Payer: Self-pay

## 2021-08-24 ENCOUNTER — Encounter (HOSPITAL_COMMUNITY): Payer: Self-pay

## 2021-08-24 ENCOUNTER — Emergency Department (HOSPITAL_COMMUNITY)
Admission: EM | Admit: 2021-08-24 | Discharge: 2021-08-24 | Disposition: A | Discharge status: Home / Self Care | Payer: Medicare Other | Attending: Emergency Medicine | Admitting: Emergency Medicine

## 2021-08-24 DIAGNOSIS — Z79899 Other long term (current) drug therapy: Secondary | ICD-10-CM | POA: Insufficient documentation

## 2021-08-24 DIAGNOSIS — E871 Hypo-osmolality and hyponatremia: Secondary | ICD-10-CM | POA: Diagnosis not present

## 2021-08-24 DIAGNOSIS — N289 Disorder of kidney and ureter, unspecified: Secondary | ICD-10-CM | POA: Diagnosis not present

## 2021-08-24 DIAGNOSIS — Z7982 Long term (current) use of aspirin: Secondary | ICD-10-CM | POA: Diagnosis not present

## 2021-08-24 DIAGNOSIS — E876 Hypokalemia: Secondary | ICD-10-CM | POA: Diagnosis not present

## 2021-08-24 DIAGNOSIS — I10 Essential (primary) hypertension: Secondary | ICD-10-CM | POA: Insufficient documentation

## 2021-08-24 DIAGNOSIS — R7309 Other abnormal glucose: Secondary | ICD-10-CM | POA: Diagnosis not present

## 2021-08-24 DIAGNOSIS — R944 Abnormal results of kidney function studies: Secondary | ICD-10-CM | POA: Diagnosis not present

## 2021-08-24 LAB — COMPREHENSIVE METABOLIC PANEL
ALT: 11 U/L (ref 0–44)
AST: 17 U/L (ref 15–41)
Albumin: 4.1 g/dL (ref 3.5–5.0)
Alkaline Phosphatase: 106 U/L (ref 38–126)
Anion gap: 12 (ref 5–15)
BUN: 14 mg/dL (ref 8–23)
CO2: 26 mmol/L (ref 22–32)
Calcium: 9.1 mg/dL (ref 8.9–10.3)
Chloride: 94 mmol/L — ABNORMAL LOW (ref 98–111)
Creatinine, Ser: 1.25 mg/dL — ABNORMAL HIGH (ref 0.44–1.00)
GFR, Estimated: 44 mL/min — ABNORMAL LOW (ref >60)
Glucose, Bld: 132 mg/dL — ABNORMAL HIGH (ref 70–99)
Potassium: 2.9 mmol/L — ABNORMAL LOW (ref 3.5–5.1)
Sodium: 132 mmol/L — ABNORMAL LOW (ref 135–145)
Total Bilirubin: 0.6 mg/dL (ref 0.3–1.2)
Total Protein: 8.4 g/dL — ABNORMAL HIGH (ref 6.5–8.1)

## 2021-08-24 LAB — CBC WITH DIFFERENTIAL/PLATELET
Abs Immature Granulocytes: 0.02 10*3/uL (ref 0.00–0.07)
Basophils Absolute: 0.1 10*3/uL (ref 0.0–0.1)
Basophils Relative: 1 %
Eosinophils Absolute: 0.2 10*3/uL (ref 0.0–0.5)
Eosinophils Relative: 2 %
HCT: 42.5 % (ref 36.0–46.0)
Hemoglobin: 14.9 g/dL (ref 12.0–15.0)
Immature Granulocytes: 0 %
Lymphocytes Relative: 32 %
Lymphs Abs: 2.4 10*3/uL (ref 0.7–4.0)
MCH: 30.2 pg (ref 26.0–34.0)
MCHC: 35.1 g/dL (ref 30.0–36.0)
MCV: 86 fL (ref 80.0–100.0)
Monocytes Absolute: 0.7 10*3/uL (ref 0.1–1.0)
Monocytes Relative: 10 %
Neutro Abs: 4 10*3/uL (ref 1.7–7.7)
Neutrophils Relative %: 55 %
Platelets: 287 10*3/uL (ref 150–400)
RBC: 4.94 MIL/uL (ref 3.87–5.11)
RDW: 13.6 % (ref 11.5–15.5)
WBC: 7.4 10*3/uL (ref 4.0–10.5)
nRBC: 0 % (ref 0.0–0.2)

## 2021-08-24 LAB — HEMOGLOBIN A1C
Hgb A1c MFr Bld: 6.1 % — ABNORMAL HIGH (ref 4.8–5.6)
Mean Plasma Glucose: 128 mg/dL

## 2021-08-24 LAB — BASIC METABOLIC PANEL
Anion gap: 10 (ref 5–15)
BUN: 14 mg/dL (ref 8–23)
CO2: 27 mmol/L (ref 22–32)
Calcium: 9 mg/dL (ref 8.9–10.3)
Chloride: 96 mmol/L — ABNORMAL LOW (ref 98–111)
Creatinine, Ser: 1.22 mg/dL — ABNORMAL HIGH (ref 0.44–1.00)
GFR, Estimated: 45 mL/min — ABNORMAL LOW (ref >60)
Glucose, Bld: 129 mg/dL — ABNORMAL HIGH (ref 70–99)
Potassium: 2.8 mmol/L — ABNORMAL LOW (ref 3.5–5.1)
Sodium: 133 mmol/L — ABNORMAL LOW (ref 135–145)

## 2021-08-24 LAB — MAGNESIUM: Magnesium: 1.8 mg/dL (ref 1.7–2.4)

## 2021-08-24 LAB — CBC
HCT: 41.5 % (ref 36.0–46.0)
Hemoglobin: 14 g/dL (ref 12.0–15.0)
MCH: 28.1 pg (ref 26.0–34.0)
MCHC: 33.7 g/dL (ref 30.0–36.0)
MCV: 83.2 fL (ref 80.0–100.0)
Platelets: 300 10*3/uL (ref 150–400)
RBC: 4.99 MIL/uL (ref 3.87–5.11)
RDW: 13.4 % (ref 11.5–15.5)
WBC: 8.1 10*3/uL (ref 4.0–10.5)
nRBC: 0 % (ref 0.0–0.2)

## 2021-08-24 MED ORDER — POTASSIUM CHLORIDE CRYS ER 20 MEQ PO TBCR: 20.0000 meq | EXTENDED_RELEASE_TABLET | Freq: Two times a day (BID) | ORAL | 0 refills | Status: DC

## 2021-08-24 MED ORDER — POTASSIUM CHLORIDE CRYS ER 20 MEQ PO TBCR
40.0000 meq | EXTENDED_RELEASE_TABLET | Freq: Once | ORAL | Status: AC
  Administered 2021-08-24: 40 meq via ORAL
  Filled 2021-08-24: qty 2

## 2021-08-24 NOTE — Discharge Instructions (Signed)
Take the potassium supplement until completed and have Dr. Moshe Cipro recheck your potassium at your next office visit.  She can help determine if you need to remain on potassium depending on what your potassium level looks like at that visit.

## 2021-08-24 NOTE — ED Triage Notes (Signed)
Pt presents to ED sent by PCP for low potassium. Pt denies any chest pain.

## 2021-08-24 NOTE — ED Notes (Signed)
Patient Alert and oriented to baseline. Stable and ambulatory to baseline. Patient verbalized understanding of the discharge instructions.  Patient belongings were taken by the patient.   

## 2021-08-24 NOTE — ED Provider Notes (Signed)
[facility] EMERGENCY DEPARTMENT Provider Note   CSN: [id number] Arrival date & time: 08/24/21  1153     History  Chief Complaint  Patient presents with   Abnormal Labs    [name] is a 81 y.o. female presenting for evaluation of hypokalemia.  She was seen by her PCP and had blood work drawn today and was found to be hypokalemic with a potassium of 2.8.  She was sent here for treatment of this condition.  She denies any specific symptoms, but has noticed generalized fatigue recently.  She denies chest pain, palpitations, shortness of breath, also denies muscle spasm or muscle pain.  She is on several medications for blood pressure, including Maxide which includes hydrochlorothiazide, she takes 25 mg of HCTZ twice daily with this treatment.  She is not on any potassium supplementation.  The history is provided by the patient.      Home Medications Prior to Admission medications   Medication Sig Start Date End Date Taking? Authorizing Provider  potassium chloride SA (KLOR-CON M) 20 MEQ tablet Take 1 tablet (20 mEq total) by mouth 2 (two) times daily. 08/24/21  Yes Mannie Wineland, Almyra Free, PA-C  ALOE VERA PO Take 500 mg by mouth daily as needed (stomach).     [provider]  amLODipine (NORVASC) 5 MG tablet Take one tablet by mouth two times daly 08/01/21   Fayrene Helper, MD  aspirin EC 81 MG tablet Take 81 mg by mouth daily.    [provider]  Calcium Carb-Cholecalciferol (CALCIUM 600/VITAMIN D3) 600-800 MG-UNIT TABS Take 1 tablet by mouth daily.    [provider]  fluticasone (FLONASE) 50 MCG/ACT nasal spray SHAKE LIQUID AND USE 2 SPRAYS IN EACH NOSTRIL DAILY AS NEEDED FOR ALLERGIES OR RHINITIS 01/09/21   Lindell Spar, MD  Magnesium Hydroxide (MILK OF MAGNESIA PO) Take by mouth as needed. 1 capful as needed    [provider]  Multiple Vitamin (MULTIVITAMIN) tablet Take 2 tablets by mouth daily.     [provider]  nystatin cream  (MYCOSTATIN) Apply 1 application topically 2 (two) times daily. 06/07/21   Fayrene Helper, MD  Omega 3 1000 MG CAPS Take 1 capsule by mouth daily.    [provider]  pantoprazole (PROTONIX) 40 MG tablet Take 1 tablet (40 mg total) by mouth daily before breakfast. 12/27/20   Erenest Rasher, PA-C  rosuvastatin (CRESTOR) 40 MG tablet Take 1 tablet (40 mg total) by mouth daily. 07/04/21   Fay Records, MD  triamterene-hydrochlorothiazide [facility]) 75-50 MG tablet Take half tablet in the morning at 10 am and half every evening at 10 pm 03/07/21   Fayrene Helper, MD      Allergies    Patient has no known allergies.    Review of Systems   Review of Systems  Constitutional:  Positive for fatigue. Negative for fever.  HENT: Negative.  Negative for congestion.   Eyes: Negative.   Respiratory:  Negative for chest tightness and shortness of breath.   Cardiovascular:  Negative for chest pain.  Gastrointestinal:  Negative for abdominal pain and nausea.  Genitourinary: Negative.   Musculoskeletal:  Negative for arthralgias, joint swelling, myalgias and neck pain.  Skin: Negative.  Negative for rash and wound.  Neurological:  Negative for dizziness, weakness, light-headedness, numbness and headaches.  Psychiatric/Behavioral: Negative.    All other systems reviewed and are negative.  Physical Exam Updated Vital Signs BP (!) 158/95 (BP Location: Right Arm)  Pulse 89    Temp 97.6 F (36.4 C)    Resp 20    Ht 5\' 7"  (1.702 m)    Wt 75.3 kg    SpO2 98%    BMI 26.00 kg/m  Physical Exam Vitals and nursing note reviewed.  Constitutional:      Appearance: She is well-developed.  HENT:     Head: Normocephalic and atraumatic.  Eyes:     Conjunctiva/sclera: Conjunctivae normal.  Cardiovascular:     Rate and Rhythm: Normal rate and regular rhythm.     Heart sounds: Normal heart sounds.  Pulmonary:     Effort: Pulmonary effort is normal.     Breath sounds: Normal breath sounds. No  wheezing.  Abdominal:     General: Bowel sounds are normal.     Palpations: Abdomen is soft.     Tenderness: There is no abdominal tenderness. There is no guarding.  Musculoskeletal:        General: Normal range of motion.     Cervical back: Normal range of motion.  Skin:    General: Skin is warm and dry.  Neurological:     Mental Status: She is alert.    ED Results / Procedures / Treatments   Labs (all labs ordered are listed, but only abnormal results are displayed) Labs Reviewed  COMPREHENSIVE METABOLIC PANEL - Abnormal; Notable for the following components:      Result Value   Sodium 132 (*)    Potassium 2.9 (*)    Chloride 94 (*)    Glucose, Bld 132 (*)    Creatinine, Ser 1.25 (*)    Total Protein 8.4 (*)    GFR, Estimated 44 (*)    All other components within normal limits  CBC WITH DIFFERENTIAL/PLATELET  MAGNESIUM    EKG None ED ECG REPORT   Date: 08/24/2021  Rate: 93  Rhythm: normal sinus rhythm  QRS Axis: normal  Intervals: normal  ST/T Wave abnormalities: normal  Conduction Disutrbances:none  Narrative Interpretation:   Old EKG Reviewed: unchanged  I have personally reviewed the EKG tracing and agree with the computerized printout as noted.  Radiology No results found.  Procedures Procedures    Medications Ordered in ED Medications  potassium chloride SA (KLOR-CON M) CR tablet 40 mEq (40 mEq Oral Given 08/24/21 1400)    ED Course/ Medical Decision Making/ A&P                           Medical Decision Making Patient presenting with generalized fatigue and a potassium level of 2.9.  Side effect of her HCTZ.  Her other labs are stable.  She does have a creatinine of 1.25 which is relatively in line with recent creatinines, not concerning today.  Magnesium level was also checked and this is normal range.  Amount and/or Complexity of Data Reviewed External Data Reviewed: labs and notes. Labs: ordered.    Details: Per above, hypokalemia, chronic  but stable elevated creatinine.  She also has some mild hyponatremia 132 and a glucose of 132.  She states she did have a sip of sugary iced tea prior to arrival but has not had any other p.o. intake today other than black coffee.  She states Dr. Moshe Cipro is watching her blood sugar, she has an appointment with her for a recheck on February 21. ECG/medicine tests: ordered and independent interpretation performed.  Risk Prescription drug management.  Final Clinical Impression(s) / ED Diagnoses Final diagnoses:  Hypokalemia  Renal insufficiency    Rx / DC Orders ED Discharge Orders          Ordered    potassium chloride SA (KLOR-CON M) 20 MEQ tablet  2 times daily        08/24/21 1506              Evalee Jefferson, PA-C 08/24/21 1508    Fredia Sorrow, MD 08/25/21 1524

## 2021-08-30 ENCOUNTER — Other Ambulatory Visit: Payer: Self-pay

## 2021-08-30 ENCOUNTER — Encounter: Payer: Self-pay | Admitting: Family Medicine

## 2021-08-30 ENCOUNTER — Telehealth: Payer: Self-pay | Admitting: Family Medicine

## 2021-08-30 ENCOUNTER — Ambulatory Visit (INDEPENDENT_AMBULATORY_CARE_PROVIDER_SITE_OTHER): Payer: Medicare Other | Admitting: Family Medicine

## 2021-08-30 VITALS — BP 138/78 | HR 90 | Ht 67.0 in | Wt 165.0 lb

## 2021-08-30 DIAGNOSIS — Z09 Encounter for follow-up examination after completed treatment for conditions other than malignant neoplasm: Secondary | ICD-10-CM | POA: Diagnosis not present

## 2021-08-30 DIAGNOSIS — I10 Essential (primary) hypertension: Secondary | ICD-10-CM

## 2021-08-30 DIAGNOSIS — E876 Hypokalemia: Secondary | ICD-10-CM

## 2021-08-30 NOTE — Patient Instructions (Signed)
F/U as before, call if you need me sooner  Stat chem 7 and EGFR today, we will call about potassium today  Blood pressure excellent, and your cuff is accurate, stay on same medication  Thanks for choosing [facility], we consider it a privelige to serve you.

## 2021-08-30 NOTE — Telephone Encounter (Signed)
I made several unsuccessful attempts to speak with the pt this pm. Pls call her , advise her to finish taking te potassium as prescribed in The ED then after this, no more

## 2021-08-31 ENCOUNTER — Telehealth: Payer: Self-pay | Admitting: Family Medicine

## 2021-08-31 LAB — BMP8+EGFR
BUN/Creatinine Ratio: 11 — ABNORMAL LOW (ref 12–28)
BUN: 15 mg/dL (ref 8–27)
CO2: 27 mmol/L (ref 20–29)
Calcium: 9.4 mg/dL (ref 8.7–10.3)
Chloride: 95 mmol/L — ABNORMAL LOW (ref 96–106)
Creatinine, Ser: 1.39 mg/dL — ABNORMAL HIGH (ref 0.57–1.00)
Glucose: 101 mg/dL — ABNORMAL HIGH (ref 70–99)
Potassium: 3.5 mmol/L (ref 3.5–5.2)
Sodium: 137 mmol/L (ref 134–144)
eGFR: 39 mL/min/{1.73_m2} — ABNORMAL LOW (ref >59)

## 2021-08-31 NOTE — Telephone Encounter (Signed)
Patient aware of results.

## 2021-08-31 NOTE — Telephone Encounter (Signed)
Patient aware to finish rx from the ED and then stop

## 2021-08-31 NOTE — Telephone Encounter (Signed)
Pt returning call for lab results  

## 2021-09-04 DIAGNOSIS — Z09 Encounter for follow-up examination after completed treatment for conditions other than malignant neoplasm: Secondary | ICD-10-CM | POA: Insufficient documentation

## 2021-09-04 DIAGNOSIS — E876 Hypokalemia: Secondary | ICD-10-CM | POA: Insufficient documentation

## 2021-09-04 NOTE — Progress Notes (Signed)
° °  [name]     MRN: [id number]      DOB: July 25, 1940   HPI [name] is here for follow up and re-evaluation of chronic medical conditions, In particular hypertension and hypokalemia which was treated in the ED States she feels well, has no coplaints or concerns , has bee checking bP at home and has brought her cuffwhich is reliabel and accurate when I check it in the office  ROS Denies recent fever or chills. Denies sinus pressure, nasal congestion, ear pain or sore throat. Denies chest congestion, productive cough or wheezing. Denies chest pains, palpitations and leg swelling Denies abdominal pain, nausea, vomiting,diarrhea or constipation.   Denies dysuria, frequency, hesitancy or incontinence. Denies uncontrolled joint pain, swelling and limitation in mobility. Denies headaches, seizures, numbness, or tingling. Denies depression, anxiety or insomnia. Denies skin break down or rash.   PE  BP 138/78    Pulse 90    Ht 5\' 7"  (1.702 m)    Wt 165 lb 0.6 oz (74.9 kg)    SpO2 90%    BMI 25.85 kg/m   Patient alert and oriented and in no cardiopulmonary distress.  HEENT: No facial asymmetry, EOMI,     Neck supple .  Chest: Clear to auscultation bilaterally.  CVS: S1, S2 no murmurs, no S3.Regular rate.  ABD: Soft non tender.   Ext: No edema  MS: Adequate ROM spine, shoulders, hips and knees.  Skin: Intact, no ulcerations or rash noted.  Psych: Good eye contact, normal affect. Memory intact not anxious or depressed appearing.  CNS: CN 2-12 intact, power,  normal throughout.no focal deficits noted.   Assessment & Plan Encounter for examination following treatment at hospital Patient in for follow up of recent eD visit for hypokalemia on 08/24/2021. Discharge summary, and laboratory and radiology data are reviewed, and any questions or concerns  are discussed. Specific issues requiring follow up are specifically addressed.   Essential hypertension Controlled, no  change in medication DASH diet and commitment to daily physical activity for a minimum of 30 minutes discussed and encouraged, as a part of hypertension management. The importance of attaining a healthy weight is also discussed.  BP/Weight 08/30/2021 08/24/2021 08/01/2021 07/04/2021 06/07/2021 04/20/2021 7/61/6073  Systolic BP 710 626 948 546 270 350 093  Diastolic BP 78 81 81 86 80 84 84  Wt. (Lbs) 165.04 166 166.12 170.8 - 170 170.12  BMI 25.85 26 26.02 26.75 - 26.63 26.64       Hypokalemia Updated lab needed will determine need for supplement moving forward.

## 2021-09-04 NOTE — Assessment & Plan Note (Signed)
Controlled, no change in medication DASH diet and commitment to daily physical activity for a minimum of 30 minutes discussed and encouraged, as a part of hypertension management. The importance of attaining a healthy weight is also discussed.  BP/Weight 08/30/2021 08/24/2021 08/01/2021 07/04/2021 06/07/2021 04/20/2021 5/69/4370  Systolic BP 052 591 028 902 284 069 861  Diastolic BP 78 81 81 86 80 84 84  Wt. (Lbs) 165.04 166 166.12 170.8 - 170 170.12  BMI 25.85 26 26.02 26.75 - 26.63 26.64

## 2021-09-04 NOTE — Assessment & Plan Note (Signed)
Patient in for follow up of recent eD visit for hypokalemia on 08/24/2021. Discharge summary, and laboratory and radiology data are reviewed, and any questions or concerns  are discussed. Specific issues requiring follow up are specifically addressed.

## 2021-09-04 NOTE — Assessment & Plan Note (Signed)
Updated lab needed will determine need for supplement moving forward.

## 2021-09-12 ENCOUNTER — Ambulatory Visit (INDEPENDENT_AMBULATORY_CARE_PROVIDER_SITE_OTHER): Payer: Medicare Other | Admitting: Family Medicine

## 2021-09-12 ENCOUNTER — Encounter: Payer: Self-pay | Admitting: Family Medicine

## 2021-09-12 ENCOUNTER — Other Ambulatory Visit: Payer: Self-pay

## 2021-09-12 VITALS — BP 130/70 | HR 94 | Ht 67.0 in | Wt 165.0 lb

## 2021-09-12 DIAGNOSIS — E876 Hypokalemia: Secondary | ICD-10-CM | POA: Diagnosis not present

## 2021-09-12 DIAGNOSIS — I10 Essential (primary) hypertension: Secondary | ICD-10-CM | POA: Diagnosis not present

## 2021-09-12 DIAGNOSIS — F17200 Nicotine dependence, unspecified, uncomplicated: Secondary | ICD-10-CM

## 2021-09-12 DIAGNOSIS — Z1231 Encounter for screening mammogram for malignant neoplasm of breast: Secondary | ICD-10-CM

## 2021-09-12 DIAGNOSIS — F17210 Nicotine dependence, cigarettes, uncomplicated: Secondary | ICD-10-CM | POA: Diagnosis not present

## 2021-09-12 DIAGNOSIS — R7303 Prediabetes: Secondary | ICD-10-CM

## 2021-09-12 DIAGNOSIS — E785 Hyperlipidemia, unspecified: Secondary | ICD-10-CM | POA: Diagnosis not present

## 2021-09-12 DIAGNOSIS — J302 Other seasonal allergic rhinitis: Secondary | ICD-10-CM

## 2021-09-12 NOTE — Progress Notes (Signed)
° °  [name]     MRN: [id number]      DOB: 1940/08/23   HPI [name] is here for follow up and re-evaluation of chronic medical conditions, medication management and review of any available recent lab and radiology data.  Preventive health is updated, specifically  Cancer screening and Immunization.   Questions or concerns regarding consultations or procedures which the PT has had in the interim are  addressed. The PT denies any adverse reactions to current medications since the last visit.  1 week h/o increased nasal congestion with clear drainage, no fever , chills, ear pain or sore throat. OTC medication not very effective   ROS . Denies chest congestion, productive cough or wheezing. Denies chest pains, palpitations and leg swelling Denies abdominal pain, nausea, vomiting,diarrhea or constipation.   Denies dysuria, frequency, hesitancy or incontinence. Denies joint pain, swelling and limitation in mobility. Denies headaches, seizures, numbness, or tingling. Denies depression, anxiety or insomnia. Denies skin break down or rash.   PE  BP 130/70    Pulse 94    Ht 5\' 7"  (1.702 m)    Wt 165 lb (74.8 kg)    SpO2 95%    BMI 25.84 kg/m    Patient alert and oriented and in no cardiopulmonary distress.  HEENT: No facial asymmetry, EOMI,     Neck supple .No sinus tenderness, no cervical adenitis, positive nasal congestion  Chest: Clear to auscultation bilaterally.  CVS: S1, S2 no murmurs, no S3.Regular rate.  ABD: Soft non tender.   Ext: No edema  MS: decreased  ROM spine, shoulders, hips and knees.  Skin: Intact, no ulcerations or rash noted.  Psych: Good eye contact, normal affect. Memory intact not anxious or depressed appearing.  CNS: CN 2-12 intact, power,  normal throughout.no focal deficits noted.   Assessment & Plan  Hypokalemia rept test is low start daily supplement  Seasonal allergies Uncontrolled, needs to commit to daily flonase ans  singulair  NICOTINE ADDICTION Asked:confirms currently smokes cigarettes Assess: Unwilling to set a quit date, but is cutting back Advise: needs to QUIT to reduce risk of cancer, cardio and cerebrovascular disease Assist: counseled for 5 minutes and literature provided Arrange: follow up in 2 to 4 months   Dyslipidemia (high LDL; low HDL) Hyperlipidemia:Low fat diet discussed and encouraged.   Lipid Panel  Lab Results  Component Value Date   CHOL 142 09/12/2021   HDL 48 09/12/2021   LDLCALC 73 09/12/2021   TRIG 114 09/12/2021   CHOLHDL 3.0 09/12/2021    Controlled, no change in medication    Essential hypertension Controlled, no change in medication DASH diet and commitment to daily physical activity for a minimum of 30 minutes discussed and encouraged, as a part of hypertension management. The importance of attaining a healthy weight is also discussed.  BP/Weight 09/12/2021 08/30/2021 08/24/2021 08/01/2021 07/04/2021 06/07/2021 5/00/9381  Systolic BP 829 937 169 678 938 101 751  Diastolic BP 70 78 81 81 86 80 84  Wt. (Lbs) 165 165.04 166 166.12 170.8 - 170  BMI 25.84 25.85 26 26.02 26.75 - 26.63

## 2021-09-12 NOTE — Patient Instructions (Addendum)
Annual exam in July in office , call if you need me sooner  Please schedule mammogram at checkout  Lipid and chem 7 and EGFr today  Ensure you drink 64 oz water every day  Work on quitting smoking .  Happy Birhtdaty in the next few weeks  Use flonase and saline flushes every day for your sinuses  Take meds as listed  BP is excellent  Thanks for choosing [facility], we consider it a privelige to serve you.

## 2021-09-13 LAB — LIPID PANEL
Chol/HDL Ratio: 3 ratio (ref 0.0–4.4)
Cholesterol, Total: 142 mg/dL (ref 100–199)
HDL: 48 mg/dL (ref >39)
LDL Chol Calc (NIH): 73 mg/dL (ref 0–99)
Triglycerides: 114 mg/dL (ref 0–149)
VLDL Cholesterol Cal: 21 mg/dL (ref 5–40)

## 2021-09-13 LAB — BMP8+EGFR
BUN/Creatinine Ratio: 10 — ABNORMAL LOW (ref 12–28)
BUN: 12 mg/dL (ref 8–27)
CO2: 26 mmol/L (ref 20–29)
Calcium: 9.3 mg/dL (ref 8.7–10.3)
Chloride: 92 mmol/L — ABNORMAL LOW (ref 96–106)
Creatinine, Ser: 1.26 mg/dL — ABNORMAL HIGH (ref 0.57–1.00)
Glucose: 104 mg/dL — ABNORMAL HIGH (ref 70–99)
Potassium: 3.2 mmol/L — ABNORMAL LOW (ref 3.5–5.2)
Sodium: 136 mmol/L (ref 134–144)
eGFR: 44 mL/min/{1.73_m2} — ABNORMAL LOW (ref >59)

## 2021-09-13 MED ORDER — POTASSIUM CHLORIDE CRYS ER 20 MEQ PO TBCR: 20.0000 meq | EXTENDED_RELEASE_TABLET | Freq: Every day | ORAL | 5 refills | Status: DC

## 2021-09-13 NOTE — Assessment & Plan Note (Signed)
rept test is low start daily supplement

## 2021-09-17 ENCOUNTER — Encounter: Payer: Self-pay | Admitting: Family Medicine

## 2021-09-17 NOTE — Assessment & Plan Note (Signed)
Controlled, no change in medication DASH diet and commitment to daily physical activity for a minimum of 30 minutes discussed and encouraged, as a part of hypertension management. The importance of attaining a healthy weight is also discussed.  BP/Weight 09/12/2021 08/30/2021 08/24/2021 08/01/2021 07/04/2021 06/07/2021 5/36/1443  Systolic BP 154 008 676 195 093 267 124  Diastolic BP 70 78 81 81 86 80 84  Wt. (Lbs) 165 165.04 166 166.12 170.8 - 170  BMI 25.84 25.85 26 26.02 26.75 - 26.63

## 2021-09-17 NOTE — Assessment & Plan Note (Signed)
Asked:confirms currently smokes cigarettes °Assess: Unwilling to set a quit date, but is cutting back °Advise: needs to QUIT to reduce risk of cancer, cardio and cerebrovascular disease °Assist: counseled for 5 minutes and literature provided °Arrange: follow up in 2 to 4 months ° °

## 2021-09-17 NOTE — Assessment & Plan Note (Signed)
Uncontrolled, needs to commit to daily flonase ans singulair

## 2021-09-17 NOTE — Assessment & Plan Note (Signed)
Hyperlipidemia:Low fat diet discussed and encouraged.   Lipid Panel  Lab Results  Component Value Date   CHOL 142 09/12/2021   HDL 48 09/12/2021   LDLCALC 73 09/12/2021   TRIG 114 09/12/2021   CHOLHDL 3.0 09/12/2021    Controlled, no change in medication

## 2021-10-20 ENCOUNTER — Ambulatory Visit
Admission: EM | Admit: 2021-10-20 | Discharge: 2021-10-20 | Disposition: A | Discharge status: Home / Self Care | Payer: Medicare Other | Attending: Urgent Care | Admitting: Urgent Care

## 2021-10-20 DIAGNOSIS — I10 Essential (primary) hypertension: Secondary | ICD-10-CM | POA: Diagnosis not present

## 2021-10-20 DIAGNOSIS — J309 Allergic rhinitis, unspecified: Secondary | ICD-10-CM

## 2021-10-20 DIAGNOSIS — N183 Chronic kidney disease, stage 3 unspecified: Secondary | ICD-10-CM

## 2021-10-20 DIAGNOSIS — J019 Acute sinusitis, unspecified: Secondary | ICD-10-CM | POA: Diagnosis not present

## 2021-10-20 MED ORDER — AMOXICILLIN 500 MG PO CAPS: 500.0000 mg | ORAL_CAPSULE | Freq: Three times a day (TID) | ORAL | 0 refills | Status: DC

## 2021-10-20 MED ORDER — PREDNISONE 10 MG PO TABS: 30.0000 mg | ORAL_TABLET | Freq: Every day | ORAL | 0 refills | Status: DC

## 2021-10-20 MED ORDER — CETIRIZINE HCL 5 MG PO TABS: 5.0000 mg | ORAL_TABLET | Freq: Every day | ORAL | 3 refills | Status: DC

## 2021-10-20 NOTE — ED Triage Notes (Signed)
Pt reports nasal congestion x 1 week.

## 2021-10-20 NOTE — ED Provider Notes (Signed)
?Ivor ? ? ?MRN: [id number] DOB: [birth year] ? ?Subjective:  ? ?[name] is a 81 y.o. female presenting for 1 week history of persistent sinus congestion, sinus pain, scratchy throat, bilateral ear pressure.  Patient feels pain along her face of the maxillary region in the frontal forehead.  Denies cough, chest pain, shortness of breath or wheezing.  Has a history of COPD but is not currently bothering her.  She does have a history of allergic rhinitis and uses Flonase daily. ? ?No current facility-administered medications for this encounter. ? ?Current Outpatient Medications:  ?  amLODipine (NORVASC) 5 MG tablet, Take one tablet by mouth two times daly, Disp: 60 tablet, Rfl: 3 ?  pantoprazole (PROTONIX) 40 MG tablet, Take 1 tablet (40 mg total) by mouth daily before breakfast., Disp: 90 tablet, Rfl: 3 ?  rosuvastatin (CRESTOR) 40 MG tablet, Take 1 tablet (40 mg total) by mouth daily., Disp: 90 tablet, Rfl: 3 ?  triamterene-hydrochlorothiazide (MAXZIDE) 75-50 MG tablet, Take half tablet in the morning at 10 am and half every evening at 10 pm, Disp: 90 tablet, Rfl: 3  ? ?No Known Allergies ? ?Past Medical History:  ?Diagnosis Date  ? COPD (chronic obstructive pulmonary disease) ([place])   ? Hiatal hernia without gangrene and obstruction 03/23/2015  ? Hypertension 2004  ? Lung nodule seen on imaging study   ? Nicotine addiction   ? OA (osteoarthritis) of knee   ? Obesity   ? Sinusitis   ?  ? ?Past Surgical History:  ?Procedure Laterality Date  ? bilateral cataract surgery  7/27 & 03/01/09  ? Dr. Gershon Crane    ? cholecystectomy    ? COLONOSCOPY  04/2004  ? Dr. Leane Para Smith-->melanosis coli  ? COLONOSCOPY WITH ESOPHAGOGASTRODUODENOSCOPY (EGD) N/A 02/25/2013  ? Dr. Fields:Normal mucosa in the terminal ileum/Severe melanosis throughout the entire examined colon/ONE COLON POLYP REMOVED (tubular adenoma)/Mild diverticulosis  in the sigmoid colon/EGD:The mucosa of the esophagus appeared normal/Non-erosive  gastritis, empiric dilation with Savary dilator  ? ESOPHAGOGASTRODUODENOSCOPY  11/07/2010  ? Jenkins:hiatal hernia/no evidence of Barrett esophagitis.  The Z-line was noted to be at 39 cm from the teeth. CLO test negative  ? ESOPHAGOGASTRODUODENOSCOPY N/A 09/11/2013  ? Dr.Fields-  incomplete esophageal web in mid-esophagus, medium sized hialtal hernia, PUD. bx=focal erosion with inflammation and fibrosis  ? ESOPHAGOGASTRODUODENOSCOPY N/A 07/06/2014  ? healed ulcers, persistent erosions on aspirin, small hiatal hernia  ? ESOPHAGOGASTRODUODENOSCOPY N/A 02/05/2020  ? normal esophagus s/p dilation, normal stomach, normal duodenal bulb.   ? knee arthroscopy right    ? LEFT HEART CATHETERIZATION WITH CORONARY ANGIOGRAM N/A 04/08/2014  ? Procedure: LEFT HEART CATHETERIZATION WITH CORONARY ANGIOGRAM;  Surgeon: Sinclair Grooms, MD;  Location: [facility] CATH LAB;  Service: Cardiovascular;  Laterality: N/A;  ? MALONEY DILATION N/A 02/25/2013  ? Procedure: MALONEY DILATION;  Surgeon: Danie Binder, MD;  Location: [facility] ENDO SU[facility];  Service: Endoscopy;  Laterality: N/A;  ? PARTIAL HYSTERECTOMY    ? SAVORY DILATION N/A 02/25/2013  ? Procedure: SAVORY DILATION;  Surgeon: Danie Binder, MD;  Location: [facility] ENDO SU[facility];  Service: Endoscopy;  Laterality: N/A;  ? total knee arthroplasty right  05/29/2005  ? Dr. Aline Brochure   ? ? ?Family History  ?Problem Relation Age of Onset  ? Diabetes Mother   ? Hypertension Mother   ? Coronary artery disease Mother   ? Pneumonia Father   ? Hypertension Sister   ? Hypertension Sister   ? Hypertension Brother   ?  pace maker  ? Diabetes Brother   ? Hypertension Brother   ? Colon cancer Neg Hx   ? Liver disease Neg Hx   ? ? ?Social History  ? ?Tobacco Use  ? Smoking status: Some Days  ?  Packs/day: 0.10  ?  Years: 10.00  ?  Pack years: 1.00  ?  Types: Cigarettes  ?  Start date: 07/23/1962  ? Smokeless tobacco: Never  ? Tobacco comments:  ?  1 per day   ?Vaping Use  ? Vaping Use: Never used  ?Substance Use Topics  ?  Alcohol use: No  ?  Alcohol/week: 0.0 standard drinks  ? Drug use: No  ? ? ?ROS ? ? ?Objective:  ? ?Vitals: ?BP (!) 175/78 (BP Location: Right Arm)   Pulse 99   Temp 98 ?F (36.7 ?C)   Resp 18   SpO2 96%  ? ?Physical Exam ?Constitutional:   ?   General: She is not in acute distress. ?   Appearance: Normal appearance. She is well-developed and normal weight. She is not ill-appearing, toxic-appearing or diaphoretic.  ?HENT:  ?   Head: Normocephalic and atraumatic.  ?   Right Ear: Tympanic membrane, ear canal and external ear normal. No drainage or tenderness. No middle ear effusion. There is no impacted cerumen. Tympanic membrane is not erythematous.  ?   Left Ear: Tympanic membrane, ear canal and external ear normal. No drainage or tenderness.  No middle ear effusion. There is no impacted cerumen. Tympanic membrane is not erythematous.  ?   Nose: Congestion present. No rhinorrhea.  ?   Comments: Facial tenderness over the maxillary regions bilaterally. ?   Mouth/Throat:  ?   Mouth: Mucous membranes are moist. No oral lesions.  ?   Pharynx: No pharyngeal swelling, oropharyngeal exudate, posterior oropharyngeal erythema or uvula swelling.  ?   Tonsils: No tonsillar exudate or tonsillar abscesses.  ?Eyes:  ?   General: No scleral icterus.    ?   Right eye: No discharge.     ?   Left eye: No discharge.  ?   Extraocular Movements: Extraocular movements intact.  ?   Right eye: Normal extraocular motion.  ?   Left eye: Normal extraocular motion.  ?   Conjunctiva/sclera: Conjunctivae normal.  ?Cardiovascular:  ?   Rate and Rhythm: Normal rate.  ?Pulmonary:  ?   Effort: Pulmonary effort is normal.  ?Musculoskeletal:  ?   Cervical back: Normal range of motion and neck supple.  ?Lymphadenopathy:  ?   Cervical: No cervical adenopathy.  ?Skin: ?   General: Skin is warm and dry.  ?Neurological:  ?   General: No focal deficit present.  ?   Mental Status: She is alert and oriented to person, place, and time.  ?Psychiatric:     ?    Mood and Affect: Mood normal.     ?   Behavior: Behavior normal.  ? ? ?Assessment and Plan :  ? ?PDMP not reviewed this encounter. ? ?1. Acute non-recurrent sinusitis, unspecified location   ?2. Allergic rhinitis, unspecified seasonality, unspecified trigger   ?3. Essential hypertension   ?4. Stage 3 chronic kidney disease, unspecified whether stage 3a or 3b CKD ([place])   ? ?Creatinine clearance calculated at 39 mL/min.  Will start empiric treatment for sinusitis with amoxicillin.  Recommended supportive care otherwise including the use of oral antihistamine long-term for her allergic rhinitis.  To address a flare, will use an oral prednisone course at 30 mg  once daily for 5 days.  Hold Flonase for now and restart in 2 weeks.  Counseled patient on potential for adverse effects with medications prescribed/recommended today, ER and return-to-clinic precautions discussed, patient verbalized understanding. ? ?  ?Jaynee Eagles, PA-C ?10/20/21 1051 ? ?

## 2021-11-21 ENCOUNTER — Encounter: Payer: Self-pay | Admitting: Internal Medicine

## 2022-01-03 ENCOUNTER — Encounter: Payer: Self-pay | Admitting: Internal Medicine

## 2022-01-03 ENCOUNTER — Ambulatory Visit (INDEPENDENT_AMBULATORY_CARE_PROVIDER_SITE_OTHER): Payer: Medicare Other | Admitting: Internal Medicine

## 2022-01-03 VITALS — BP 164/73 | HR 99 | Temp 98.0°F | Ht 67.0 in | Wt 164.2 lb

## 2022-01-03 DIAGNOSIS — D125 Benign neoplasm of sigmoid colon: Secondary | ICD-10-CM

## 2022-01-03 DIAGNOSIS — R140 Abdominal distension (gaseous): Secondary | ICD-10-CM | POA: Diagnosis not present

## 2022-01-03 DIAGNOSIS — K219 Gastro-esophageal reflux disease without esophagitis: Secondary | ICD-10-CM

## 2022-01-03 DIAGNOSIS — K5900 Constipation, unspecified: Secondary | ICD-10-CM | POA: Diagnosis not present

## 2022-01-03 NOTE — Patient Instructions (Signed)
I am happy to hear that you are doing well today.  Continue on pantoprazole daily for your chronic acid reflux.  Let us know if you need refills and we will send them in.  Continue milk of magnesia as needed for your constipation.  Follow-up in 1 year or sooner if needed.  We can discuss risk versus benefits of repeat colonoscopy next year.  It was very nice meeting you today.  Dr. Abbey Chatters  At [facility]ology we value your feedback. You may receive a survey about your visit today. Please share your experience as we strive to create trusting relationships with our patients to provide genuine, compassionate, quality care.  We appreciate your understanding and patience as we review any laboratory studies, imaging, and other diagnostic tests that are ordered as we care for you. Our office policy is 5 business days for review of these results, and any emergent or urgent results are addressed in a timely manner for your best interest. If you do not hear from our office in 1 week, please contact us.   We also encourage the use of MyChart, which contains your medical information for your review as well. If you are not enrolled in this feature, an access code is on this after visit summary for your convenience. Thank you for allowing [place] to be involved in your care.  It was great to see you today!  I hope you have a great rest of your Spring!    [name]. Abbey Chatters, D.O. Gastroenterology and Hepatology [facility]

## 2022-01-03 NOTE — Progress Notes (Signed)
Referring Provider: Fayrene Helper, MD Primary Care Physician:  Fayrene Helper, MD Primary GI:  Dr. Abbey Chatters  Chief Complaint  Patient presents with   Follow-up    Patient here today for a follow up. She states her constipation is better with taking milk of magnesia once per day.  Patient reports no issues with Jerrye Bushy and pantoprazole 40 mg once per day is helping this.    HPI:   [name] is a 81 y.o. female who presents yearly follow-up visit.  Has history of chronic GERD well-controlled on pantoprazole daily.  Does not require refills.  Some abdominal bloating, intermittent in nature.  No dysphagia odynophagia.  No epigastric or chest pain.  Also has chronic constipation.  Previously given Linzess samples though unable to afford formal prescription.  Now taking milk of magnesia as needed which keeps her symptoms well controlled.  No melena hematochezia.  Last colonoscopy 2014 with 1 small tubular adenoma removed from the sigmoid colon.  Recommended 10-year recall.  Otherwise no complaints.  Past Medical History:  Diagnosis Date   COPD (chronic obstructive pulmonary disease) ([place])    Hiatal hernia without gangrene and obstruction 03/23/2015   Hypertension 2004   Lung nodule seen on imaging study    Nicotine addiction    OA (osteoarthritis) of knee    Obesity    Sinusitis     Past Surgical History:  Procedure Laterality Date   bilateral cataract surgery  7/27 & 03/01/09   Dr. Gershon Crane     cholecystectomy     COLONOSCOPY  04/2004   Dr. Leane Para Smith-->melanosis coli   COLONOSCOPY WITH ESOPHAGOGASTRODUODENOSCOPY (EGD) N/A 02/25/2013   Dr. Fields:Normal mucosa in the terminal ileum/Severe melanosis throughout the entire examined colon/ONE COLON POLYP REMOVED (tubular adenoma)/Mild diverticulosis  in the sigmoid colon/EGD:The mucosa of the esophagus appeared normal/Non-erosive gastritis, empiric dilation with Savary dilator   ESOPHAGOGASTRODUODENOSCOPY  11/07/2010    Jenkins:hiatal hernia/no evidence of Barrett esophagitis.  The Z-line was noted to be at 39 cm from the teeth. CLO test negative   ESOPHAGOGASTRODUODENOSCOPY N/A 09/11/2013   Dr.Fields-  incomplete esophageal web in mid-esophagus, medium sized hialtal hernia, PUD. bx=focal erosion with inflammation and fibrosis   ESOPHAGOGASTRODUODENOSCOPY N/A 07/06/2014   healed ulcers, persistent erosions on aspirin, small hiatal hernia   ESOPHAGOGASTRODUODENOSCOPY N/A 02/05/2020   normal esophagus s/p dilation, normal stomach, normal duodenal bulb.    knee arthroscopy right     LEFT HEART CATHETERIZATION WITH CORONARY ANGIOGRAM N/A 04/08/2014   Procedure: LEFT HEART CATHETERIZATION WITH CORONARY ANGIOGRAM;  Surgeon: Sinclair Grooms, MD;  Location: [facility] CATH LAB;  Service: Cardiovascular;  Laterality: N/A;   MALONEY DILATION N/A 02/25/2013   Procedure: Venia Minks DILATION;  Surgeon: Danie Binder, MD;  [facility]: [facility] ENDO [facility];  Service: Endoscopy;  Laterality: N/A;   PARTIAL HYSTERECTOMY     SAVORY DILATION N/A 02/25/2013   Procedure: SAVORY DILATION;  Surgeon: Danie Binder, MD;  Location: [facility] ENDO SUITE;  Service: Endoscopy;  Laterality: N/A;   total knee arthroplasty right  05/29/2005   Dr. Aline Brochure     Current Outpatient Medications  Medication Sig Dispense Refill   amLODipine (NORVASC) 5 MG tablet Take one tablet by mouth two times daly 60 tablet 3   cetirizine (ZYRTEC) 5 MG tablet Take 1 tablet (5 mg total) by mouth daily. 90 tablet 3   fluticasone (FLONASE) 50 MCG/ACT nasal spray Place 2 sprays into both nostrils daily.     Omega-3 Fatty  Acids (FISH OIL) 1000 MG CAPS Take 100 mg by mouth daily at 6 (six) AM.     pantoprazole (PROTONIX) 40 MG tablet Take 1 tablet (40 mg total) by mouth daily before breakfast. (Patient taking differently: Take 40 mg by mouth daily.) 90 tablet 3   Potassium Chloride (KLOR-CON PO) Take 20 mEq by mouth daily at 6 (six) AM.     rosuvastatin (CRESTOR) 40 MG tablet Take 1 tablet  (40 mg total) by mouth daily. 90 tablet 3   triamterene-hydrochlorothiazide (MAXZIDE) 75-50 MG tablet Take half tablet in the morning at 10 am and half every evening at 10 pm 90 tablet 3   No current facility-administered medications for this visit.    Allergies as of 01/03/2022   (No Known Allergies)    Family History  Problem Relation Age of Onset   Diabetes Mother    Hypertension Mother    Coronary artery disease Mother    Pneumonia Father    Hypertension Sister    Hypertension Sister    Hypertension Brother        Psychologist, forensic   Diabetes Brother    Hypertension Brother    Colon cancer Neg Hx    Liver disease Neg Hx     Social History   Socioeconomic History   Marital status: Widowed    Spouse name: Not on file   Number of children: 4   Years of education: Not on file   Highest education level: Not on file  Occupational History   Occupation: employed     Employer: RETIRED  Tobacco Use   Smoking status: Some Days    Packs/day: 0.10    Years: 10.00    Total pack years: 1.00    Types: Cigarettes    Start date: 07/23/1962   Smokeless tobacco: Never   Tobacco comments:    1 per day   Vaping Use   Vaping Use: Never used  Substance and Sexual Activity   Alcohol use: No    Alcohol/week: 0.0 standard drinks of alcohol   Drug use: No   Sexual activity: Not Currently  Other Topics Concern   Not on file  Social History Narrative   Not on file   Social Determinants of Health   Financial Resource Strain: Low Risk  (03/07/2021)   Overall Financial Resource Strain (CARDIA)    Difficulty of Paying Living Expenses: Not hard at all  Food Insecurity: No Food Insecurity (03/07/2021)   Hunger Vital Sign    Worried About Running Out of Food in the Last Year: Never true    Ran Out of Food in the Last Year: Never true  Transportation Needs: No Transportation Needs (03/07/2021)   PRAPARE - Hydrologist (Medical): No    Lack of Transportation  (Non-Medical): No  Physical Activity: Insufficiently Active (03/07/2021)   Exercise Vital Sign    Days of Exercise per Week: 3 days    Minutes of Exercise per Session: 30 min  Stress: No Stress Concern Present (03/07/2021)   [place]    Feeling of Stress : Not at all  Social Connections: Moderately Isolated (03/07/2021)   Social Connection and Isolation Panel [NHANES]    Frequency of Communication with Friends and Family: More than three times a week    Frequency of Social Gatherings with Friends and Family: More than three times a week    Attends Religious Services: More than 4 times per  year    Active Member of Clubs or Organizations: No    Attends Archivist Meetings: Never    Marital Status: Widowed    Subjective: Review of Systems  Constitutional:  Negative for chills and fever.  HENT:  Negative for congestion and hearing loss.   Eyes:  Negative for blurred vision and double vision.  Respiratory:  Negative for cough and shortness of breath.   Cardiovascular:  Negative for chest pain and palpitations.  Gastrointestinal:  Positive for constipation and heartburn. Negative for abdominal pain, blood in stool, diarrhea, melena and vomiting.       Bloating  Genitourinary:  Negative for dysuria and urgency.  Musculoskeletal:  Negative for joint pain and myalgias.  Skin:  Negative for itching and rash.  Neurological:  Negative for dizziness and headaches.  Psychiatric/Behavioral:  Negative for depression. The patient is not nervous/anxious.      Objective: BP (!) 164/73 (BP Location: Left Arm, Patient Position: Sitting, Cuff Size: Large)   Pulse 99   Temp 98 F (36.7 C) (Oral)   Ht '5\' 7"'$  (1.702 m)   Wt 164 lb 3.2 oz (74.5 kg)   BMI 25.72 kg/m  Physical Exam Constitutional:      Appearance: Normal appearance.  HENT:     Head: Normocephalic and atraumatic.  Eyes:     Extraocular Movements:  Extraocular movements intact.     Conjunctiva/sclera: Conjunctivae normal.  Cardiovascular:     Rate and Rhythm: Normal rate and regular rhythm.  Pulmonary:     Effort: Pulmonary effort is normal.     Breath sounds: Normal breath sounds.  Abdominal:     General: Bowel sounds are normal.     Palpations: Abdomen is soft.  Musculoskeletal:        General: No swelling. Normal range of motion.     Cervical back: Normal range of motion and neck supple.  Skin:    General: Skin is warm and dry.     Coloration: Skin is not jaundiced.  Neurological:     General: No focal deficit present.     Mental Status: She is alert and oriented to person, place, and time.  Psychiatric:        Mood and Affect: Mood normal.        Behavior: Behavior normal.      Assessment: *Chronic GERD-relatively well controlled on Pantoprazole 40 mg daily *Abdominal bloating *Chronic constipation  *Adenomatous colon polyp  Plan: GERD well-controlled on pantoprazole daily.  We will continue.  Constipation well-controlled on as needed milk of magnesia.  No alarm symptoms today.  History of adenomatous colon polyp 2014.  Recommended 10 recall.  We can discuss next year risk versus benefits of proceeding.  Follow-up in 1 year or sooner if needed.  01/03/2022 9:31 AM   Disclaimer: This note was dictated with voice recognition software. Similar sounding words can inadvertently be transcribed and may not be corrected upon review.

## 2022-01-16 ENCOUNTER — Other Ambulatory Visit: Payer: Self-pay | Admitting: Family Medicine

## 2022-01-19 ENCOUNTER — Telehealth: Payer: Self-pay

## 2022-01-19 NOTE — Telephone Encounter (Signed)
Pt seen you on 01/03/2022 and you asked her about refilling her meds and she thought she had more but she doesn't. Please send in more refills of Pantoprazole 40 mg tablets for the pt.

## 2022-01-24 ENCOUNTER — Other Ambulatory Visit: Payer: Self-pay | Admitting: Internal Medicine

## 2022-01-24 MED ORDER — PANTOPRAZOLE SODIUM 40 MG PO TBEC: 40.0000 mg | DELAYED_RELEASE_TABLET | Freq: Every day | ORAL | 3 refills | Status: DC

## 2022-01-24 NOTE — Telephone Encounter (Signed)
Refills sent to Mail in pharmacy

## 2022-01-25 NOTE — Telephone Encounter (Signed)
Phoned and LMOVM for the pt regarding her refills being sent to her mail pharmacy.

## 2022-01-30 ENCOUNTER — Ambulatory Visit (INDEPENDENT_AMBULATORY_CARE_PROVIDER_SITE_OTHER): Payer: Medicare Other | Admitting: Family Medicine

## 2022-01-30 ENCOUNTER — Encounter: Payer: Self-pay | Admitting: Family Medicine

## 2022-01-30 VITALS — BP 150/82 | HR 94 | Ht 67.0 in | Wt 162.0 lb

## 2022-01-30 DIAGNOSIS — R7303 Prediabetes: Secondary | ICD-10-CM

## 2022-01-30 DIAGNOSIS — Z0001 Encounter for general adult medical examination with abnormal findings: Secondary | ICD-10-CM

## 2022-01-30 DIAGNOSIS — E785 Hyperlipidemia, unspecified: Secondary | ICD-10-CM

## 2022-01-30 DIAGNOSIS — I10 Essential (primary) hypertension: Secondary | ICD-10-CM

## 2022-01-30 MED ORDER — OLMESARTAN MEDOXOMIL 20 MG PO TABS: ORAL_TABLET | ORAL | 4 refills | Status: DC

## 2022-01-30 NOTE — Assessment & Plan Note (Signed)
Uncontrolled , add olmesartan 20 mg daily DASH diet and commitment to daily physical activity for a minimum of 30 minutes discussed and encouraged, as a part of hypertension management. The importance of attaining a healthy weight is also discussed.     01/30/2022   11:04 AM 01/30/2022   11:02 AM 01/03/2022    9:16 AM 10/20/2021   10:40 AM 09/12/2021   11:01 AM 09/12/2021   10:28 AM 09/12/2021   10:26 AM  BP/Weight  Systolic BP 625 638 [phone] 572  Diastolic BP 82 76 73 78 70 78 77  Wt. (Lbs)  162 164.2    165  BMI  25.37 kg/m2 25.72 kg/m2    25.84 kg/m2

## 2022-01-30 NOTE — Assessment & Plan Note (Signed)

## 2022-01-30 NOTE — Progress Notes (Signed)
    [name]     MRN: [id number]      DOB: 11/05/1940  HPI: Patient is in for annual physical exam. Uncontrolled hypertension is addressed. Recent labs,  are reviewed. Immunization is reviewed , and  updated if needed.   PE: BP (!) 150/82 (BP Location: Left Arm, Cuff Size: Normal)   Pulse 94   Ht '5\' 7"'$  (1.702 m)   Wt 162 lb (73.5 kg)   SpO2 94%   BMI 25.37 kg/m   Pleasant  female, alert and oriented x 3, in no cardio-pulmonary distress. Afebrile. HEENT No facial trauma or asymetry. Sinuses non tender.  Extra occullar muscles intact.. External ears normal, . Neck: decreased ROM, no adenopathy,JVD or thyromegaly.No bruits.  Chest: Clear to ascultation bilaterally.No crackles or wheezes.Decrfeased air entry Non tender to palpation  Cardiovascular system; Heart sounds normal,  S1 and  S2 ,no S3.  No murmur, or thrill. Apical beat not displaced Peripheral pulses normal.  Abdomen: Soft, non tender,Bowel sounds normal. No guarding, tenderness or rebound.    Musculoskeletal exam: DEceased  ROM of spine, hips , shoulders and knees.  deformity ,swelling or crepitus noted. No muscle wasting or atrophy.   Neurologic: Cranial nerves 2 to 12 intact. Power, tone ,sensation normal throughout.  disturbance in gait. No tremor.  Skin: Intact, no ulceration, erythema , scaling or rash noted. Pigmentation normal throughout  Psych; Normal mood and affect. Judgement and concentration normal   Assessment & Plan:  Annual visit for general adult medical examination with abnormal findings Annual exam as documented. Counseling done  re healthy lifestyle involving commitment to 150 minutes exercise per week, heart healthy diet, and attaining healthy weight.The importance of adequate sleep also discussed. Regular seat belt use and home safety, is also discussed. Changes in health habits are decided on by the patient with goals and time frames  set for achieving  them. Immunization and cancer screening needs are specifically addressed at this visit.   Essential hypertension Uncontrolled , add olmesartan 20 mg daily DASH diet and commitment to daily physical activity for a minimum of 30 minutes discussed and encouraged, as a part of hypertension management. The importance of attaining a healthy weight is also discussed.     01/30/2022   11:04 AM 01/30/2022   11:02 AM 01/03/2022    9:16 AM 10/20/2021   10:40 AM 09/12/2021   11:01 AM 09/12/2021   10:28 AM 09/12/2021   10:26 AM  BP/Weight  Systolic BP 166 063 016 010 932 355 732  Diastolic BP 82 76 73 78 70 78 77  Wt. (Lbs)  162 164.2    165  BMI  25.37 kg/m2 25.72 kg/m2    25.84 kg/m2

## 2022-01-30 NOTE — Patient Instructions (Addendum)
F/u end September, call if you need me sooner  New additional med for blood pressure is olmesartan 20 mg take at bedtime  Continue amlodipine and triamterene as before  Please get fasting lipid, cmp and egfr, TSH and HBA1c 5 days before next visit  Please stop smoking!  Thanks for choosing [facility], we consider it a privelige to serve you.

## 2022-03-22 ENCOUNTER — Ambulatory Visit (HOSPITAL_COMMUNITY)
Admission: RE | Admit: 2022-03-22 | Discharge: 2022-03-22 | Disposition: A | Discharge status: Home / Self Care | Payer: Medicare Other | Source: Ambulatory Visit | Attending: Family Medicine | Admitting: Family Medicine

## 2022-03-22 DIAGNOSIS — Z1231 Encounter for screening mammogram for malignant neoplasm of breast: Secondary | ICD-10-CM | POA: Insufficient documentation

## 2022-03-28 ENCOUNTER — Ambulatory Visit (INDEPENDENT_AMBULATORY_CARE_PROVIDER_SITE_OTHER): Payer: Medicare Other

## 2022-03-28 DIAGNOSIS — Z0000 Encounter for general adult medical examination without abnormal findings: Secondary | ICD-10-CM

## 2022-03-28 NOTE — Progress Notes (Signed)
I connected with  [name] on 03/28/22 by a audio enabled telemedicine application and verified that I am speaking with the correct person using two identifiers.  Patient Location: Home  Provider Location: Home Office  I discussed the limitations of evaluation and management by telemedicine. The patient expressed understanding and agreed to proceed.  Subjective:   [name] is a 81 y.o. female who presents for Medicare Annual (Subsequent) preventive examination.  Review of Systems     Cardiac Risk Factors include: advanced age (>62mn, >>40women);dyslipidemia;hypertension;smoking/ tobacco exposure     Objective:    There were no vitals filed for this visit. There is no height or weight on file to calculate BMI.     08/24/2021   11:59 AM 03/07/2021    3:42 PM 03/01/2020    8:21 AM 02/05/2020   10:23 AM 02/24/2018    8:45 AM 12/03/2016    9:50 AM 02/20/2016    9:38 AM  Advanced Directives  Does Patient Have a Medical Advance Directive? Yes Yes Yes Yes No No No  Type of [place] will Living will;Healthcare Power of [place]     Does patient want to make changes to medical advance directive?   No - Patient declined  Yes (ED - Information included in AVS)    Copy of [place] Chart?   No - copy requested      Would patient like information on creating a medical advance directive?      Yes (MAU/Ambulatory/Procedural Areas - Information given) Yes - Educational materials given    Current Medications (verified) Outpatient Encounter Medications as of 03/28/2022  Medication Sig   amLODipine (NORVASC) 5 MG tablet TAKE 1 TABLET(5 MG) BY MOUTH DAILY   cetirizine (ZYRTEC) 5 MG tablet Take 1 tablet (5 mg total) by mouth daily.   fluticasone (FLONASE) 50 MCG/ACT nasal spray Place 2 sprays into both nostrils daily.   olmesartan (BENICAR) 20 MG tablet Take one tablet by mouth every  night at 10 pm for blood pressure   Omega-3 Fatty Acids (FISH OIL) 1000 MG CAPS Take 100 mg by mouth daily at 6 (six) AM.   Potassium Chloride (KLOR-CON PO) Take 20 mEq by mouth daily at 6 (six) AM.   rosuvastatin (CRESTOR) 40 MG tablet Take 1 tablet (40 mg total) by mouth daily.   triamterene-hydrochlorothiazide (MAXZIDE) 75-50 MG tablet Take half tablet in the morning at 10 am and half every evening at 10 pm   No facility-administered encounter medications on file as of 03/28/2022.    Allergies (verified) Patient has no known allergies.   History: Past Medical History:  Diagnosis Date   COPD (chronic obstructive pulmonary disease) [place]    Hiatal hernia without gangrene and obstruction 03/23/2015   Hypertension 2004   Lung nodule seen on imaging study    Nicotine addiction    OA (osteoarthritis) of knee    Obesity    Sinusitis    Past Surgical History:  Procedure Laterality Date   bilateral cataract surgery  7/27 & 03/01/09   Dr. SGershon Crane    cholecystectomy     COLONOSCOPY  04/2004   Dr. LLeane ParaSmith-->melanosis coli   COLONOSCOPY WITH ESOPHAGOGASTRODUODENOSCOPY (EGD) N/A 02/25/2013   Dr. Fields:Normal mucosa in the terminal ileum/Severe melanosis throughout the entire examined colon/ONE COLON POLYP REMOVED (tubular adenoma)/Mild diverticulosis  in the sigmoid colon/EGD:The mucosa of the esophagus appeared normal/Non-erosive gastritis, empiric  dilation with Savary dilator   ESOPHAGOGASTRODUODENOSCOPY  11/07/2010   Jenkins:hiatal hernia/no evidence of Barrett esophagitis.  The Z-line was noted to be at 39 cm from the teeth. CLO test negative   ESOPHAGOGASTRODUODENOSCOPY N/A 09/11/2013   Dr.Fields-  incomplete esophageal web in mid-esophagus, medium sized hialtal hernia, PUD. bx=focal erosion with inflammation and fibrosis   ESOPHAGOGASTRODUODENOSCOPY N/A 07/06/2014   healed ulcers, persistent erosions on aspirin, small hiatal hernia   ESOPHAGOGASTRODUODENOSCOPY N/A 02/05/2020   normal  esophagus s/p dilation, normal stomach, normal duodenal bulb.    knee arthroscopy right     LEFT HEART CATHETERIZATION WITH CORONARY ANGIOGRAM N/A 04/08/2014   Procedure: LEFT HEART CATHETERIZATION WITH CORONARY ANGIOGRAM;  Surgeon: Sinclair Grooms, MD;  Location: [facility] CATH LAB;  Service: Cardiovascular;  Laterality: N/A;   MALONEY DILATION N/A 02/25/2013   Procedure: Venia Minks DILATION;  Surgeon: Danie Binder, MD;  Location: [facility] ENDO SUITE;  Service: Endoscopy;  Laterality: N/A;   PARTIAL HYSTERECTOMY     SAVORY DILATION N/A 02/25/2013   Procedure: SAVORY DILATION;  Surgeon: Danie Binder, MD;  Location: [facility] END[facility];  Service: Endoscopy;  Laterality: N/A;   total knee arthroplasty right  05/29/2005   Dr. Aline Brochure    Family History  Problem Relation Age of Onset   Diabetes Mother    Hypertension Mother    Coronary artery disease Mother    Pneumonia Father    Hypertension Sister    Hypertension Sister    Hypertension Brother        Psychologist, forensic   Diabetes Brother    Hypertension Brother    Colon cancer Neg Hx    Liver disease Neg Hx    Social History   Socioeconomic History   Marital status: Widowed    Spouse name: Not on file   Number of children: 4   Years of education: Not on file   Highest education level: Not on file  Occupational History   Occupation: employed     Employer: RETIRED  Tobacco Use   Smoking status: Some Days    Packs/day: 0.10    Years: 10.00    Total pack years: 1.00    Types: Cigarettes    Start date: 07/23/1962   Smokeless tobacco: Never   Tobacco comments:    1 per day   Vaping Use   Vaping Use: Never used  Substance and Sexual Activity   Alcohol use: No    Alcohol/week: 0.0 standard drinks of alcohol   Drug use: No   Sexual activity: Not Currently  Other Topics Concern   Not on file  Social History Narrative   Not on file   Social Determinants of Health   Financial Resource Strain: Low Risk  (03/28/2022)   Overall Financial Resource Strain  (CARDIA)    Difficulty of Paying Living Expenses: Not hard at all  Food Insecurity: No Food Insecurity (03/28/2022)   Hunger Vital Sign    Worried About Running Out of Food in the Last Year: Never true    [place] in the Last Year: Never true  Transportation Needs: No Transportation Needs (03/28/2022)   PRAPARE - Hydrologist (Medical): No    Lack of Transportation (Non-Medical): No  Physical Activity: Insufficiently Active (03/07/2021)   Exercise Vital Sign    Days of Exercise per Week: 3 days    Minutes of Exercise per Session: 30 min  Stress: No Stress Concern Present (03/07/2021)   [place]  [employer] - Occupational Stress Questionnaire    Feeling of Stress : Not at all  Social Connections: Moderately Isolated (03/28/2022)   Social Connection and Isolation Panel [NHANES]    Frequency of Communication with Friends and Family: Three times a week    Frequency of Social Gatherings with Friends and Family: Three times a week    Attends Religious Services: More than 4 times per year    Active Member of Clubs or Organizations: No    Attends Archivist Meetings: Never    Marital Status: Widowed    Tobacco Counseling Ready to quit: Not Answered Counseling given: Not Answered Tobacco comments: 1 per day    Clinical Intake:     Pain : No/denies pain     Nutritional Status: BMI 25 -29 Overweight Diabetes: No     Diabetic?na         Activities of Daily Living    03/28/2022    1:18 PM  In your present state of health, do you have any difficulty performing the following activities:  Hearing? 0  Vision? 0  Difficulty concentrating or making decisions? 0  Walking or climbing stairs? 0  Dressing or bathing? 0  Doing errands, shopping? 0  Preparing Food and eating ? N  Using the Toilet? N  In the past six months, have you accidently leaked urine? N  Do you have problems with loss of bowel control? N  Managing  your Medications? N  Managing your Finances? N  Housekeeping or managing your Housekeeping? N    Patient Care Team: Fayrene Helper, MD as PCP - General Herminio Commons, MD (Inactive) as Attending Physician (Cardiology) Danie Binder, MD (Inactive) as Consulting Physician (Gastroenterology)  Indicate any recent Medical Services you may have received from other than [facility] providers in the past year (date may be approximate).     Assessment:   This is a routine wellness examination for [facility].  Hearing/Vision screen No results found.  Dietary issues and exercise activities discussed: Current Exercise Habits: Home exercise routine, Type of exercise: Other - see comments, Time (Minutes): 30, Frequency (Times/Week): 3, Weekly Exercise (Minutes/Week): 90, Intensity: Mild, Exercise limited by: None identified   Goals Addressed   None    Depression Screen    01/30/2022   11:45 AM 01/30/2022   11:04 AM 09/12/2021   10:28 AM 08/30/2021    8:37 AM 08/01/2021   10:23 AM 04/20/2021   10:06 AM 03/07/2021    9:36 AM  PHQ 2/9 Scores  PHQ - 2 Score 0 0 0 0 0 0 0  PHQ- 9 Score      0     Fall Risk    03/28/2022    1:17 PM 01/30/2022   11:04 AM 09/12/2021   10:28 AM 08/30/2021    8:37 AM 08/01/2021   10:23 AM  [place] in the past year? 0 0 0 0 0  Number falls in past yr: 0 0 0 0 0  Injury with Fall? 0 0 0 0 0  Risk for fall due to :  No Fall Risks No Fall Risks No Fall Risks No Fall Risks  Follow up  Falls evaluation completed Falls evaluation completed Falls evaluation completed Falls evaluation completed    FALL RISK PREVENTION PERTAINING TO THE HOME:  Any stairs in or around the home? No  If so, are there any without handrails? No  Home free of loose throw rugs in  walkways, pet beds, electrical cords, etc? Yes  Adequate lighting in your home to reduce risk of falls? Yes   ASSISTIVE DEVICES UTILIZED TO PREVENT FALLS:  Life alert? No  Use of a cane, walker or w/c?  No  Grab bars in the bathroom? Yes  Shower chair or bench in shower? Yes  Elevated toilet seat or a handicapped toilet? Yes    Cognitive Function:        03/28/2022    1:19 PM 03/07/2021    3:44 PM 03/01/2020    8:23 AM 02/26/2019    8:11 AM 02/24/2018    8:48 AM  6CIT Screen  What Year? 0 points 0 points 0 points 0 points 0 points  What month? 0 points 0 points 0 points 0 points 0 points  What time? 0 points 0 points 0 points 0 points 0 points  Count back from 20 0 points 0 points 0 points 0 points 2 points  Months in reverse 0 points 0 points 0 points 2 points 2 points  Repeat phrase 0 points 10 points 0 points 0 points 2 points  Total Score 0 points 10 points 0 points 2 points 6 points    Immunizations Immunization History  Administered Date(s) Administered   Fluad Quad(high Dose 65+) 04/27/2019, 04/14/2020, 04/20/2021   H1N1 06/21/2008   Influenza Split 05/20/2012   Influenza Whole 05/07/2007, 04/19/2008, 05/09/2010, 03/30/2011   Influenza, High Dose Seasonal PF 06/04/2018   Influenza,inj,Quad PF,6+ Mos 04/29/2013, 06/15/2014, 05/24/2015, 06/11/2016, 05/06/2017   Moderna SARS-COV2 Booster Vaccination 06/13/2021   PFIZER(Purple Top)SARS-COV-2 Vaccination 10/15/2019, 11/07/2019, 06/02/2020   PPD Test 09/01/2018, 09/01/2018, 04/18/2020   Pneumococcal Conjugate-13 02/16/2014   Pneumococcal Polysaccharide-23 04/19/2008   Td 07/24/1995, 03/17/2009    TDAP status: Up to date  Flu Vaccine status: Due, Education has been provided regarding the importance of this vaccine. Advised may receive this vaccine at local pharmacy or Health Dept. Aware to provide a copy of the vaccination record if obtained from local pharmacy or Health Dept. Verbalized acceptance and understanding.  Pneumococcal vaccine status: Up to date  Covid-19 vaccine status: Completed vaccines  Qualifies for Shingles Vaccine? Yes   Zostavax completed No   Shingrix Completed?: No.    Education has been provided  regarding the importance of this vaccine. Patient has been advised to call insurance company to determine out of pocket expense if they have not yet received this vaccine. Advised may also receive vaccine at local pharmacy or Health Dept. Verbalized acceptance and understanding.  Screening Tests Health Maintenance  Topic Date Due   Zoster Vaccines- Shingrix (1 of 2) Never done   COVID-19 Vaccine (4 - Pfizer series) 08/08/2021   INFLUENZA VACCINE  02/20/2022   TETANUS/TDAP  06/14/2022 (Originally 03/18/2019)   Pneumonia Vaccine 18+ Years old  Completed   DEXA SCAN  Completed   Hepatitis C Screening  Completed   HPV VACCINES  Aged Out    Health Maintenance  Health Maintenance Due  Topic Date Due   Zoster Vaccines- Shingrix (1 of 2) Never done   COVID-19 Vaccine (4 - Pfizer series) 08/08/2021   INFLUENZA VACCINE  02/20/2022    Colorectal cancer screening: Type of screening: Colonoscopy. Completed 2014. Repeat every 10 years  Mammogram status: Completed 2023. Repeat every year  Bone Density status: Completed 2019. Results reflect: Bone density results: NORMAL. Repeat every 5 years.  Lung Cancer Screening: (Low Dose CT Chest recommended if Age 51-80 years, 30 pack-year currently smoking OR have quit w/in 15years.)  does not qualify.   Lung Cancer Screening Referral: na  Additional Screening:  Hepatitis C Screening: does qualify; Completed   Vision Screening: Recommended annual ophthalmology exams for early detection of glaucoma and other disorders of the eye. Is the patient up to date with their annual eye exam?  Yes  Who is the provider or what is the name of the office in which the patient attends annual eye exams? [email or web address] If pt is not established with a provider, would they like to be referred to a provider to establish care? No .   Dental Screening: Recommended annual dental exams for proper oral hygiene  Community Resource Referral / Chronic Care Management: CRR required  this visit?  No   CCM required this visit?  No      Plan:     I have personally reviewed and noted the following in the patient's chart:   Medical and social history Use of alcohol, tobacco or illicit drugs  Current medications and supplements including opioid prescriptions. Patient is not currently taking opioid prescriptions. Functional ability and status Nutritional status Physical activity Advanced directives List of other physicians Hospitalizations, surgeries, and ER visits in previous 12 months Vitals Screenings to include cognitive, depression, and falls Referrals and appointments  In addition, I have reviewed and discussed with patient certain preventive protocols, quality metrics, and best practice recommendations. A written personalized care plan for preventive services as well as general preventive health recommendations were provided to patient.     Eual Fines, LPN   02/23/6961   Nurse Notes: Schedule your next wellness visit for 1 year at checkout

## 2022-03-28 NOTE — Patient Instructions (Signed)
  Ms. Rodger , Thank you for taking time to come for your Medicare Wellness Visit. I appreciate your ongoing commitment to your health goals. Please review the following plan we discussed and let me know if I can assist you in the future.   These are the goals we discussed:  Goals      Increase physical activity     Try walking at least 3 days per week for 20-30 mins at a time      Quit smoking / using tobacco        This is a list of the screening recommended for you and due dates:  Health Maintenance  Topic Date Due   Zoster (Shingles) Vaccine (1 of 2) Never done   COVID-19 Vaccine (4 - Pfizer series) 08/08/2021   Flu Shot  02/20/2022   Tetanus Vaccine  06/14/2022*   Pneumonia Vaccine  Completed   DEXA scan (bone density measurement)  Completed   Hepatitis C Screening: USPSTF Recommendation to screen - Ages 45-79 yo.  Completed   HPV Vaccine  Aged Out  *Topic was postponed. The date shown is not the original due date.

## 2022-04-17 ENCOUNTER — Ambulatory Visit
Admission: EM | Admit: 2022-04-17 | Discharge: 2022-04-17 | Disposition: A | Discharge status: Home / Self Care | Payer: Medicare Other

## 2022-04-17 ENCOUNTER — Ambulatory Visit: Payer: Medicare Other | Admitting: Family Medicine

## 2022-04-17 DIAGNOSIS — I10 Essential (primary) hypertension: Secondary | ICD-10-CM | POA: Diagnosis not present

## 2022-04-17 DIAGNOSIS — J0140 Acute pansinusitis, unspecified: Secondary | ICD-10-CM | POA: Diagnosis not present

## 2022-04-17 DIAGNOSIS — E785 Hyperlipidemia, unspecified: Secondary | ICD-10-CM | POA: Diagnosis not present

## 2022-04-17 DIAGNOSIS — R7303 Prediabetes: Secondary | ICD-10-CM | POA: Diagnosis not present

## 2022-04-17 MED ORDER — BENZONATATE 100 MG PO CAPS: 100.0000 mg | ORAL_CAPSULE | Freq: Three times a day (TID) | ORAL | 0 refills | Status: DC | PRN

## 2022-04-17 MED ORDER — AMOXICILLIN-POT CLAVULANATE 875-125 MG PO TABS: 1.0000 | ORAL_TABLET | Freq: Two times a day (BID) | ORAL | 0 refills | Status: DC

## 2022-04-17 NOTE — Discharge Instructions (Signed)
Take medication as directed. Continue Flonase that you are currently taking. Increase fluids and get plenty of rest. May take over-the-counter Tylenol as needed for pain, fever, or general discomfort. Recommend normal saline nasal spray to help with nasal congestion throughout the day. For your cough, it may be helpful to use a humidifier at bedtime during sleep. If your symptoms fail to improve within the next 7 to 10 days, please follow-up with your primary care physician.

## 2022-04-17 NOTE — ED Triage Notes (Signed)
Pt reports headache, sinus pressure, nasal congestion x 1 week. Mucinex gives no relief, just help with sleep.

## 2022-04-17 NOTE — ED Provider Notes (Signed)
[facility]    CSN: [id number] Arrival date & time: 04/17/22  1147      History   Chief Complaint Chief Complaint  Patient presents with   Nasal Congestion   Headache    HPI [name] is a 81 y.o. female.   The history is provided by the patient.   Patient presents for 1-1/2-week history of upper respiratory symptoms.  Patient complains of headache, nasal congestion, productive cough, sinus congestion.  Patient denies fever, chills, ear pain, sore throat, wheezing, shortness of breath, or GI symptoms.  Patient states that she has a history of sinus infections.  States that she has been taking over-the-counter medications such as Flonase and an over-the-counter off brand Mucinex with minimal relief.  Past Medical History:  Diagnosis Date   COPD (chronic obstructive pulmonary disease) ([place])    Hiatal hernia without gangrene and obstruction 03/23/2015   Hypertension 2004   Lung nodule seen on imaging study    Nicotine addiction    OA (osteoarthritis) of knee    Obesity    Sinusitis     Patient Active Problem List   Diagnosis Date Noted   Hypokalemia 09/04/2021   Right knee pain 03/07/2021   Annual visit for general adult medical examination with abnormal findings 01/23/2021   Dyspepsia 11/24/2019   Elevated alkaline phosphatase level 11/24/2019   Seasonal allergies 05/06/2017   Bloating symptom 03/15/2016   CAD (coronary artery disease), native coronary artery 05/06/2014   Gastric ulcer 03/23/2014   Lung nodule seen on imaging study 26/33/3545   Metabolic syndrome X 62/56/3893   OA (osteoarthritis) of knee 05/20/2013   GERD (gastroesophageal reflux disease) 02/12/2013   Overweight (BMI 25.0-29.9) 02/12/2013   COPD (chronic obstructive pulmonary disease) ([place]) 08/25/2011   Vitamin D deficiency 11/23/2010   BACK PAIN WITH RADICULOPATHY 08/22/2009   Prediabetes 05/31/2008   Dyslipidemia (high LDL; low HDL) 03/02/2008   NICOTINE ADDICTION 10/30/2007    Essential hypertension 10/30/2007    Past Surgical History:  Procedure Laterality Date   bilateral cataract surgery  7/27 & 03/01/09   Dr. Gershon Crane     cholecystectomy     COLONOSCOPY  04/2004   Dr. Leane Para Smith-->melanosis coli   COLONOSCOPY WITH ESOPHAGOGASTRODUODENOSCOPY (EGD) N/A 02/25/2013   Dr. Fields:Normal mucosa in the terminal ileum/Severe melanosis throughout the entire examined colon/ONE COLON POLYP REMOVED (tubular adenoma)/Mild diverticulosis  in the sigmoid colon/EGD:The mucosa of the esophagus appeared normal/Non-erosive gastritis, empiric dilation with Savary dilator   ESOPHAGOGASTRODUODENOSCOPY  11/07/2010   Jenkins:hiatal hernia/no evidence of Barrett esophagitis.  The Z-line was noted to be at 39 cm from the teeth. CLO test negative   ESOPHAGOGASTRODUODENOSCOPY N/A 09/11/2013   Dr.Fields-  incomplete esophageal web in mid-esophagus, medium sized hialtal hernia, PUD. bx=focal erosion with inflammation and fibrosis   ESOPHAGOGASTRODUODENOSCOPY N/A 07/06/2014   healed ulcers, persistent erosions on aspirin, small hiatal hernia   ESOPHAGOGASTRODUODENOSCOPY N/A 02/05/2020   normal esophagus s/p dilation, normal stomach, normal duodenal bulb.    knee arthroscopy right     LEFT HEART CATHETERIZATION WITH CORONARY ANGIOGRAM N/A 04/08/2014   Procedure: LEFT HEART CATHETERIZATION WITH CORONARY ANGIOGRAM;  Surgeon: Sinclair Grooms, MD;  Location: [facility] CATH LAB;  Service: Cardiovascular;  Laterality: N/A;   MALONEY DILATION N/A 02/25/2013   Procedure: Venia Minks DILATION;  Surgeon: Danie Binder, MD;  Location: [facility];  Service: Endoscopy;  Laterality: N/A;   PARTIAL HYSTERECTOMY     SAVORY DILATION N/A 02/25/2013   Procedure: SAVORY  DILATION;  Surgeon: Danie Binder, MD;  Location: [facility];  Service: Endoscopy;  Laterality: N/A;   total knee arthroplasty right  05/29/2005   Dr. Aline Brochure     OB History     Gravida  4   Para  4   Term  4   Preterm      AB       Living  4      SAB      IAB      Ectopic      Multiple      Live Births               Home Medications    Prior to Admission medications   Medication Sig Start Date End Date Taking? Authorizing Provider  amoxicillin-clavulanate (AUGMENTIN) 875-125 MG tablet Take 1 tablet by mouth every 12 (twelve) hours. 04/17/22  Yes Shanique Aslinger-Warren, Alda Lea, NP  benzonatate (TESSALON PERLES) 100 MG capsule Take 1 capsule (100 mg total) by mouth 3 (three) times daily as needed for cough. 04/17/22  Yes Bellami Farrelly-Warren, Alda Lea, NP  dextromethorphan-guaiFENesin (MUCINEX DM) 30-600 MG 12hr tablet Take 1 tablet by mouth 2 (two) times daily.   Yes [provider]  amLODipine (NORVASC) 5 MG tablet TAKE 1 TABLET(5 MG) BY MOUTH DAILY 01/16/22   Fayrene Helper, MD  cetirizine (ZYRTEC) 5 MG tablet Take 1 tablet (5 mg total) by mouth daily. 10/20/21   Jaynee Eagles, PA-C  fluticasone (FLONASE) 50 MCG/ACT nasal spray Place 2 sprays into both nostrils daily.    [provider]  olmesartan (BENICAR) 20 MG tablet Take one tablet by mouth every night at 10 pm for blood pressure 01/30/22   Fayrene Helper, MD  Omega-3 Fatty Acids (FISH OIL) 1000 MG CAPS Take 100 mg by mouth daily at 6 (six) AM.    [provider]  Potassium Chloride (KLOR-CON PO) Take 20 mEq by mouth daily at 6 (six) AM.    [provider]  rosuvastatin (CRESTOR) 40 MG tablet Take 1 tablet (40 mg total) by mouth daily. 07/04/21   Fay Records, MD  triamterene-hydrochlorothiazide (MAXZIDE) 75-50 MG tablet Take half tablet in the morning at 10 am and half every evening at 10 pm 03/07/21   Fayrene Helper, MD    Family History Family History  Problem Relation Age of Onset   Diabetes Mother    Hypertension Mother    Coronary artery disease Mother    Pneumonia Father    Hypertension Sister    Hypertension Sister    Hypertension Brother        Psychologist, forensic   Diabetes Brother    Hypertension Brother     Colon cancer Neg Hx    Liver disease Neg Hx     Social History Social History   Tobacco Use   Smoking status: Some Days    Packs/day: 0.10    Years: 10.00    Total pack years: 1.00    Types: Cigarettes    Start date: 07/23/1962   Smokeless tobacco: Never   Tobacco comments:    1 per day   Vaping Use   Vaping Use: Never used  Substance Use Topics   Alcohol use: No    Alcohol/week: 0.0 standard drinks of alcohol   Drug use: No     Allergies   Patient has no known allergies.   Review of Systems Review of Systems Per HPI  Physical Exam Triage Vital Signs ED  Triage Vitals  Enc Vitals Group     BP 04/17/22 1201 (!) 148/76     Pulse Rate 04/17/22 1201 86     Resp 04/17/22 1201 (!) 22     Temp 04/17/22 1201 (!) 97.5 F (36.4 C)     Temp Source 04/17/22 1201 Oral     SpO2 04/17/22 1201 94 %     Weight --      Height --      Head Circumference --      Peak Flow --      Pain Score 04/17/22 1203 6     Pain Loc --      Pain Edu? --      Excl. in [place]? --    No data found.  Updated Vital Signs BP (!) 148/76 (BP Location: Right Arm)   Pulse 86   Temp (!) 97.5 F (36.4 C) (Oral)   Resp (!) 22   SpO2 94%   Visual Acuity Right Eye Distance:   Left Eye Distance:   Bilateral Distance:    Right Eye Near:   Left Eye Near:    Bilateral Near:     Physical Exam Vitals and nursing note reviewed.  Constitutional:      General: She is not in acute distress.    Appearance: She is well-developed.  HENT:     Head: Normocephalic and atraumatic.     Right Ear: Tympanic membrane, ear canal and external ear normal.     Left Ear: Tympanic membrane, ear canal and external ear normal.     Nose: Congestion present.     Right Turbinates: Enlarged and swollen.     Left Turbinates: Enlarged and swollen.     Right Sinus: Maxillary sinus tenderness and frontal sinus tenderness present.     Left Sinus: Maxillary sinus tenderness and frontal sinus tenderness present.      Mouth/Throat:     Lips: Pink.     Mouth: Mucous membranes are moist.     Pharynx: Uvula midline. Posterior oropharyngeal erythema present. No pharyngeal swelling, oropharyngeal exudate or uvula swelling.  Eyes:     Extraocular Movements: Extraocular movements intact.     Conjunctiva/sclera: Conjunctivae normal.     Pupils: Pupils are equal, round, and reactive to light.  Neck:     Thyroid: No thyromegaly.     Trachea: No tracheal deviation.  Cardiovascular:     Rate and Rhythm: Normal rate and regular rhythm.     Heart sounds: Normal heart sounds.  Pulmonary:     Effort: Pulmonary effort is normal.     Breath sounds: Normal breath sounds.  Abdominal:     General: Bowel sounds are normal. There is no distension.     Palpations: Abdomen is soft.     Tenderness: There is no abdominal tenderness.  Musculoskeletal:     Cervical back: Normal range of motion and neck supple.  Skin:    General: Skin is warm and dry.  Neurological:     General: No focal deficit present.     Mental Status: She is alert and oriented to person, place, and time.  Psychiatric:        Mood and Affect: Mood normal.        Behavior: Behavior normal.        Thought Content: Thought content normal.        Judgment: Judgment normal.      UC Treatments / Results  Labs (all labs ordered are listed, but  only abnormal results are displayed) Labs Reviewed - No data to display  EKG   Radiology No results found.  Procedures Procedures (including critical care time)  Medications Ordered in UC Medications - No data to display  Initial Impression / Assessment and Plan / UC Course  I have reviewed the triage vital signs and the nursing notes.  Pertinent labs & imaging results that were available during my care of the patient were reviewed by me and considered in my medical decision making (see chart for details).  Patient presents for upper respiratory symptoms.  On exam, she has maxillary and frontal  sinus tenderness.  Symptoms have been present for more than 1 week.  Based on patient's current presentation of symptoms, and duration of her symptoms, will start her on Augmentin for acute pansinusitis.  Patient was advised to continue use of Flonase at this time.  Patient was also prescribed Tessalon Perles to help with her cough symptoms.  Supportive care recommendations were provided to the patient.  Patient advised to follow-up with her primary care physician if symptoms fail to improve.  Patient verbalizes understanding.  All questions were answered. Final Clinical Impressions(s) / UC Diagnoses   Final diagnoses:  Acute pansinusitis, recurrence not specified     Discharge Instructions      Take medication as directed. Continue Flonase that you are currently taking. Increase fluids and get plenty of rest. May take over-the-counter Tylenol as needed for pain, fever, or general discomfort. Recommend normal saline nasal spray to help with nasal congestion throughout the day. For your cough, it may be helpful to use a humidifier at bedtime during sleep. If your symptoms fail to improve within the next 7 to 10 days, please follow-up with your primary care physician.     ED Prescriptions     Medication Sig Dispense Auth. Provider   amoxicillin-clavulanate (AUGMENTIN) 875-125 MG tablet Take 1 tablet by mouth every 12 (twelve) hours. 14 tablet Anterrio Mccleery-Warren, Alda Lea, NP   benzonatate (TESSALON PERLES) 100 MG capsule Take 1 capsule (100 mg total) by mouth 3 (three) times daily as needed for cough. 30 capsule Rodert Hinch-Warren, Alda Lea, NP      PDMP not reviewed this encounter.   Tish Men, NP 04/17/22 1245

## 2022-04-18 LAB — CMP14+EGFR
ALT: 8 IU/L (ref 0–32)
AST: 17 IU/L (ref 0–40)
Albumin/Globulin Ratio: 1.4 (ref 1.2–2.2)
Albumin: 4.2 g/dL (ref 3.8–4.8)
Alkaline Phosphatase: 105 IU/L (ref 44–121)
BUN/Creatinine Ratio: 10 — ABNORMAL LOW (ref 12–28)
BUN: 15 mg/dL (ref 8–27)
Bilirubin Total: 0.6 mg/dL (ref 0.0–1.2)
CO2: 24 mmol/L (ref 20–29)
Calcium: 9.3 mg/dL (ref 8.7–10.3)
Chloride: 98 mmol/L (ref 96–106)
Creatinine, Ser: 1.5 mg/dL — ABNORMAL HIGH (ref 0.57–1.00)
Globulin, Total: 2.9 g/dL (ref 1.5–4.5)
Glucose: 92 mg/dL (ref 70–99)
Potassium: 4.3 mmol/L (ref 3.5–5.2)
Sodium: 139 mmol/L (ref 134–144)
Total Protein: 7.1 g/dL (ref 6.0–8.5)
eGFR: 35 mL/min/{1.73_m2} — ABNORMAL LOW (ref >59)

## 2022-04-18 LAB — LIPID PANEL
Chol/HDL Ratio: 3.6 ratio (ref 0.0–4.4)
Cholesterol, Total: 170 mg/dL (ref 100–199)
HDL: 47 mg/dL (ref >39)
LDL Chol Calc (NIH): 98 mg/dL (ref 0–99)
Triglycerides: 142 mg/dL (ref 0–149)
VLDL Cholesterol Cal: 25 mg/dL (ref 5–40)

## 2022-04-18 LAB — TSH: TSH: 1.07 u[IU]/mL (ref 0.450–4.500)

## 2022-04-18 LAB — HEMOGLOBIN A1C
Est. average glucose Bld gHb Est-mCnc: 126 mg/dL
Hgb A1c MFr Bld: 6 % — ABNORMAL HIGH (ref 4.8–5.6)

## 2022-04-25 ENCOUNTER — Ambulatory Visit (INDEPENDENT_AMBULATORY_CARE_PROVIDER_SITE_OTHER): Payer: Medicare Other | Admitting: Family Medicine

## 2022-04-25 ENCOUNTER — Encounter: Payer: Self-pay | Admitting: Family Medicine

## 2022-04-25 VITALS — BP 116/72 | HR 100 | Ht 67.0 in | Wt 172.0 lb

## 2022-04-25 DIAGNOSIS — I10 Essential (primary) hypertension: Secondary | ICD-10-CM

## 2022-04-25 DIAGNOSIS — N1832 Chronic kidney disease, stage 3b: Secondary | ICD-10-CM

## 2022-04-25 DIAGNOSIS — R7303 Prediabetes: Secondary | ICD-10-CM | POA: Diagnosis not present

## 2022-04-25 DIAGNOSIS — E785 Hyperlipidemia, unspecified: Secondary | ICD-10-CM

## 2022-04-25 DIAGNOSIS — F17200 Nicotine dependence, unspecified, uncomplicated: Secondary | ICD-10-CM | POA: Diagnosis not present

## 2022-04-25 DIAGNOSIS — F17210 Nicotine dependence, cigarettes, uncomplicated: Secondary | ICD-10-CM | POA: Diagnosis not present

## 2022-04-25 DIAGNOSIS — Z23 Encounter for immunization: Secondary | ICD-10-CM

## 2022-04-25 DIAGNOSIS — E663 Overweight: Secondary | ICD-10-CM

## 2022-04-25 DIAGNOSIS — N183 Chronic kidney disease, stage 3 unspecified: Secondary | ICD-10-CM | POA: Insufficient documentation

## 2022-04-25 NOTE — Patient Instructions (Signed)
F/U in mid February, call if you need me sooner  Excellent blood pressure, no medication changes  Labs are good except for chronic kidney disease, you are referred to Dr Marylene Buerger about this  Flu vaccine today  It is important that you exercise regularly at least 30 minutes 5 times a week. If you develop chest pain, have severe difficulty breathing, or feel very tired, stop exercising immediately and seek medical attention   Think about what you will eat, plan ahead. Choose " clean, green, fresh or frozen" over canned, processed or packaged foods which are more sugary, salty and fatty. 70 to 75% of food eaten should be vegetables and fruit. Three meals at set times with snacks allowed between meals, but they must be fruit or vegetables. Aim to eat over a 12 hour period , example 7 am to 7 pm, and STOP after  your last meal of the day. Drink water,generally about 64 ounces per day, no other drink is as healthy. Fruit juice is best enjoyed in a healthy way, by EATING the fruit.   Thanks for choosing [facility], we consider it a privelige to serve you.

## 2022-04-25 NOTE — Assessment & Plan Note (Signed)
Refer to Nephrology

## 2022-04-30 ENCOUNTER — Encounter: Payer: Self-pay | Admitting: Family Medicine

## 2022-04-30 NOTE — Assessment & Plan Note (Signed)
Hyperlipidemia:Low fat diet discussed and encouraged.   Lipid Panel  Lab Results  Component Value Date   CHOL 170 04/17/2022   HDL 47 04/17/2022   LDLCALC 98 04/17/2022   TRIG 142 04/17/2022   CHOLHDL 3.6 04/17/2022     Controlled, no change in medication

## 2022-04-30 NOTE — Progress Notes (Signed)
[name]     MRN: [id number]      DOB: Sep 13, 1940   HPI [name] is here for follow up and re-evaluation of chronic medical conditions, medication management and review of any available recent lab and radiology data.  Preventive health is updated, specifically  Cancer screening and Immunization.   Questions or concerns regarding consultations or procedures which the PT has had in the interim are  addressed. The PT denies any adverse reactions to current medications since the last visit.  There are no new concerns.  There are no specific complaints   ROS Denies recent fever or chills. Denies sinus pressure, nasal congestion, ear pain or sore throat. Denies chest congestion, productive cough or wheezing. Denies chest pains, palpitations and leg swelling Denies abdominal pain, nausea, vomiting,diarrhea or constipation.   Denies dysuria, frequency, hesitancy or incontinence. Denies joint pain, swelling and limitation in mobility. Denies headaches, seizures, numbness, or tingling. Denies depression, anxiety or insomnia. Denies skin break down or rash.   PE  BP 116/72   Pulse 100   Ht '5\' 7"'$  (1.702 m)   Wt 172 lb (78 kg)   SpO2 95%   BMI 26.94 kg/m   Patient alert and oriented and in no cardiopulmonary distress.  HEENT: No facial asymmetry, EOMI,     Neck supple .  Chest: Clear to auscultation bilaterally.  CVS: S1, S2 no murmurs, no S3.Regular rate.  ABD: Soft non tender.   Ext: No edema  MS: Adequate though reduced  ROM spine, shoulders, hips and knees.  Skin: Intact, no ulcerations or rash noted.  Psych: Good eye contact, normal affect. Memory intact not anxious or depressed appearing.  CNS: CN 2-12 intact, power,  normal throughout.no focal deficits noted.   Assessment & Plan  CKD (chronic kidney disease) stage 3, GFR 30-59 ml/min (HCC) Refer to Nephrology  Essential hypertension Controlled, no change in medication DASH diet and commitment to  daily physical activity for a minimum of 30 minutes discussed and encouraged, as a part of hypertension management. The importance of attaining a healthy weight is also discussed.     04/25/2022    1:48 PM 04/25/2022    1:28 PM 04/17/2022   12:01 PM 01/30/2022   11:04 AM 01/30/2022   11:02 AM 01/03/2022    9:16 AM 10/20/2021   10:40 AM  BP/Weight  Systolic BP 573 220 [phone]  Diastolic BP 72 72 76 82 76 73 78  Wt. (Lbs)  172   162 164.2   BMI  26.94 kg/m2   25.37 kg/m2 25.72 kg/m2        Dyslipidemia (high LDL; low HDL) Hyperlipidemia:Low fat diet discussed and encouraged.   Lipid Panel  Lab Results  Component Value Date   CHOL 170 04/17/2022   HDL 47 04/17/2022   LDLCALC 98 04/17/2022   TRIG 142 04/17/2022   CHOLHDL 3.6 04/17/2022     Controlled, no change in medication   NICOTINE ADDICTION Asked:confirms currently smokes cigarettes Assess: Unwilling to set a quit date, but is cutting back Advise: needs to QUIT to reduce risk of cancer, cardio and cerebrovascular disease Assist: counseled for 5 minutes and literature provided Arrange: follow up in 2 to 4 months   Overweight (BMI 25.0-29.9)  Patient re-educated about  the importance of commitment to a  minimum of 150 minutes of exercise per week as able.  The importance of healthy food choices with portion control discussed, as well as eating  regularly and within a 12 hour window most days. The need to choose "clean , green" food 50 to 75% of the time is discussed, as well as to make water the primary drink and set a goal of 64 ounces water daily.       04/25/2022    1:28 PM 01/30/2022   11:02 AM 01/03/2022    9:16 AM  Weight /BMI  Weight 172 lb 162 lb 164 lb 3.2 oz  Height '5\' 7"'$  (1.702 m) '5\' 7"'$  (1.702 m) '5\' 7"'$  (1.702 m)  BMI 26.94 kg/m2 25.37 kg/m2 25.72 kg/m2      Prediabetes Patient educated about the importance of limiting  Carbohydrate intake , the need to commit to daily physical activity  for a minimum of 30 minutes , and to commit weight loss. The fact that changes in all these areas will reduce or eliminate all together the development of diabetes is stressed.      Latest Ref Rng & Units 04/17/2022   11:31 AM 09/12/2021   11:27 AM 08/30/2021    9:25 AM 08/24/2021   12:23 PM 08/24/2021   10:14 AM  Diabetic Labs  HbA1c 4.8 - 5.6 % 6.0     6.1   Chol 100 - 199 mg/dL 170  142      HDL >39 mg/dL 47  48      Calc LDL 0 - 99 mg/dL 98  73      Triglycerides 0 - 149 mg/dL 142  114      Creatinine 0.57 - 1.00 mg/dL 1.50  1.26  1.39  1.25        04/25/2022    1:48 PM 04/25/2022    1:28 PM 04/17/2022   12:01 PM 01/30/2022   11:04 AM 01/30/2022   11:02 AM 01/03/2022    9:16 AM 10/20/2021   10:40 AM  BP/Weight  Systolic BP 195 093 267 124 580 998 338  Diastolic BP 72 72 76 82 76 73 78  Wt. (Lbs)  172   162 164.2   BMI  26.94 kg/m2   25.37 kg/m2 25.72 kg/m2       05/02/2010   12:00 AM  Foot/eye exam completion dates  Foot exam Order yes         This result is from an external source.

## 2022-04-30 NOTE — Assessment & Plan Note (Signed)
Asked:confirms currently smokes cigarettes °Assess: Unwilling to set a quit date, but is cutting back °Advise: needs to QUIT to reduce risk of cancer, cardio and cerebrovascular disease °Assist: counseled for 5 minutes and literature provided °Arrange: follow up in 2 to 4 months ° °

## 2022-04-30 NOTE — Assessment & Plan Note (Signed)
Patient educated about the importance of limiting  Carbohydrate intake , the need to commit to daily physical activity for a minimum of 30 minutes , and to commit weight loss. The fact that changes in all these areas will reduce or eliminate all together the development of diabetes is stressed.      Latest Ref Rng & Units 04/17/2022   11:31 AM 09/12/2021   11:27 AM 08/30/2021    9:25 AM 08/24/2021   12:23 PM 08/24/2021   10:14 AM  Diabetic Labs  HbA1c 4.8 - 5.6 % 6.0     6.1   Chol 100 - 199 mg/dL 170  142      HDL >39 mg/dL 47  48      Calc LDL 0 - 99 mg/dL 98  73      Triglycerides 0 - 149 mg/dL 142  114      Creatinine 0.57 - 1.00 mg/dL 1.50  1.26  1.39  1.25        04/25/2022    1:48 PM 04/25/2022    1:28 PM 04/17/2022   12:01 PM 01/30/2022   11:04 AM 01/30/2022   11:02 AM 01/03/2022    9:16 AM 10/20/2021   10:40 AM  BP/Weight  Systolic BP 256 389 373 428 768 115 726  Diastolic BP 72 72 76 82 76 73 78  Wt. (Lbs)  172   162 164.2   BMI  26.94 kg/m2   25.37 kg/m2 25.72 kg/m2       05/02/2010   12:00 AM  Foot/eye exam completion dates  Foot exam Order yes         This result is from an external source.

## 2022-04-30 NOTE — Assessment & Plan Note (Signed)
  Patient re-educated about  the importance of commitment to a  minimum of 150 minutes of exercise per week as able.  The importance of healthy food choices with portion control discussed, as well as eating regularly and within a 12 hour window most days. The need to choose "clean , green" food 50 to 75% of the time is discussed, as well as to make water the primary drink and set a goal of 64 ounces water daily.       04/25/2022    1:28 PM 01/30/2022   11:02 AM 01/03/2022    9:16 AM  Weight /BMI  Weight 172 lb 162 lb 164 lb 3.2 oz  Height '5\' 7"'$  (1.702 m) '5\' 7"'$  (1.702 m) '5\' 7"'$  (1.702 m)  BMI 26.94 kg/m2 25.37 kg/m2 25.72 kg/m2

## 2022-04-30 NOTE — Assessment & Plan Note (Signed)
Controlled, no change in medication DASH diet and commitment to daily physical activity for a minimum of 30 minutes discussed and encouraged, as a part of hypertension management. The importance of attaining a healthy weight is also discussed.     04/25/2022    1:48 PM 04/25/2022    1:28 PM 04/17/2022   12:01 PM 01/30/2022   11:04 AM 01/30/2022   11:02 AM 01/03/2022    9:16 AM 10/20/2021   10:40 AM  BP/Weight  Systolic BP 100 349 611 643 539 122 583  Diastolic BP 72 72 76 82 76 73 78  Wt. (Lbs)  172   162 164.2   BMI  26.94 kg/m2   25.37 kg/m2 25.72 kg/m2

## 2022-05-03 ENCOUNTER — Telehealth: Payer: Self-pay | Admitting: Radiology

## 2022-05-03 MED ORDER — CEPHALEXIN 500 MG PO CAPS
2000.0000 mg | ORAL_CAPSULE | Freq: Once | ORAL | 2 refills | Status: AC
Start: 2022-05-03 — End: 2022-05-03

## 2022-05-03 NOTE — Telephone Encounter (Signed)
Patient needs to have dental procedure, needs antibiotics sent to [facility] on [address]

## 2022-05-03 NOTE — Telephone Encounter (Signed)
05/29/2005 right knee replacement

## 2022-05-28 ENCOUNTER — Other Ambulatory Visit: Payer: Self-pay | Admitting: Family Medicine

## 2022-06-29 DIAGNOSIS — J019 Acute sinusitis, unspecified: Secondary | ICD-10-CM | POA: Diagnosis not present

## 2022-06-29 DIAGNOSIS — I10 Essential (primary) hypertension: Secondary | ICD-10-CM | POA: Diagnosis not present

## 2022-08-31 ENCOUNTER — Ambulatory Visit: Payer: Medicare Other | Attending: Cardiology | Admitting: Cardiology

## 2022-08-31 ENCOUNTER — Encounter: Payer: Self-pay | Admitting: Cardiology

## 2022-08-31 VITALS — BP 134/88 | HR 93 | Ht 67.0 in | Wt 176.6 lb

## 2022-08-31 DIAGNOSIS — E785 Hyperlipidemia, unspecified: Secondary | ICD-10-CM | POA: Diagnosis not present

## 2022-08-31 DIAGNOSIS — I2510 Atherosclerotic heart disease of native coronary artery without angina pectoris: Secondary | ICD-10-CM

## 2022-08-31 DIAGNOSIS — F17200 Nicotine dependence, unspecified, uncomplicated: Secondary | ICD-10-CM | POA: Diagnosis not present

## 2022-08-31 DIAGNOSIS — I10 Essential (primary) hypertension: Secondary | ICD-10-CM

## 2022-08-31 MED ORDER — EZETIMIBE 10 MG PO TABS
10.0000 mg | ORAL_TABLET | Freq: Every day | ORAL | 3 refills | Status: DC
Start: 2022-08-31 — End: 2023-08-09

## 2022-08-31 NOTE — Addendum Note (Signed)
Addended by: Levonne Hubert on: 08/31/2022 04:47 PM   Modules accepted: Orders

## 2022-08-31 NOTE — Patient Instructions (Addendum)
Medication Instructions:  Your physician has recommended you make the following change in your medication:   Start Zetia 10 mg Daily  Restart Aspirin 81 mg Daily   *If you need a refill on your cardiac medications before your next appointment, please call your pharmacy*   Lab Work: Your physician recommends that you return for lab work in: 3 Months ( 11/29/2022) Fasting   If you have labs (blood work) drawn today and your tests are completely normal, you will receive your results only by: [place] (if you have MyChart) OR A paper copy in the mail If you have any lab test that is abnormal or we need to change your treatment, we will call you to review the results.   Testing/Procedures: NONE    Follow-Up: At [facility], you and your health needs are our priority.  As part of our continuing mission to provide you with exceptional heart care, we have created designated Provider Care Teams.  These Care Teams include your primary Cardiologist (physician) and Advanced Practice Providers (APPs -  Physician Assistants and Nurse Practitioners) who all work together to provide you with the care you need, when you need it.  We recommend signing up for the patient portal called "MyChart".  Sign up information is provided on this After Visit Summary.  MyChart is used to connect with patients for Virtual Visits (Telemedicine).  Patients are able to view lab/test results, encounter notes, upcoming appointments, etc.  Non-urgent messages can be sent to your provider as well.   To learn more about what you can do with MyChart, go to [email or web address].    Your next appointment:   1 year(s)  Provider:   You may see Dorris Carnes, MD or one of the following Advanced Practice Providers on your designated Care Team:   Bernerd Pho, PA-C  Ermalinda Barrios, PA-C     Other Instructions Thank you for choosing [facility]!

## 2022-08-31 NOTE — Progress Notes (Signed)
Cardiology Clinic Note   Patient Name: [name] Date of Encounter: 08/31/2022  Primary Care Provider:  Fayrene Helper, MD Primary Cardiologist:  Dorris Carnes, MD  Patient Profile    82 year old female with a past medical history of coronary artery disease, essential hypertension, COPD, gastroesophageal reflux disease, dyslipidemia, current smoker, prediabetes, and arthritis who presents today for follow-up on her coronary artery disease.  Past Medical History    Past Medical History:  Diagnosis Date   COPD (chronic obstructive pulmonary disease) ([place])    Hiatal hernia without gangrene and obstruction 03/23/2015   Hypertension 2004   Lung nodule seen on imaging study    Nicotine addiction    OA (osteoarthritis) of knee    Obesity    Sinusitis    Past Surgical History:  Procedure Laterality Date   bilateral cataract surgery  7/27 & 03/01/09   Dr. Gershon Crane     cholecystectomy     COLONOSCOPY  04/2004   Dr. Leane Para Smith-->melanosis coli   COLONOSCOPY WITH ESOPHAGOGASTRODUODENOSCOPY (EGD) N/A 02/25/2013   Dr. Fields:Normal mucosa in the terminal ileum/Severe melanosis throughout the entire examined colon/ONE COLON POLYP REMOVED (tubular adenoma)/Mild diverticulosis  in the sigmoid colon/EGD:The mucosa of the esophagus appeared normal/Non-erosive gastritis, empiric dilation with Savary dilator   ESOPHAGOGASTRODUODENOSCOPY  11/07/2010   Jenkins:hiatal hernia/no evidence of Barrett esophagitis.  The Z-line was noted to be at 39 cm from the teeth. CLO test negative   ESOPHAGOGASTRODUODENOSCOPY N/A 09/11/2013   Dr.Fields-  incomplete esophageal web in mid-esophagus, medium sized hialtal hernia, PUD. bx=focal erosion with inflammation and fibrosis   ESOPHAGOGASTRODUODENOSCOPY N/A 07/06/2014   healed ulcers, persistent erosions on aspirin, small hiatal hernia   ESOPHAGOGASTRODUODENOSCOPY N/A 02/05/2020   normal esophagus s/p dilation, normal stomach, normal duodenal bulb.     knee arthroscopy right     LEFT HEART CATHETERIZATION WITH CORONARY ANGIOGRAM N/A 04/08/2014   Procedure: LEFT HEART CATHETERIZATION WITH CORONARY ANGIOGRAM;  Surgeon: Sinclair Grooms, MD;  Location: [facility] CATH LAB;  Service: Cardiovascular;  Laterality: N/A;   MALONEY DILATION N/A 02/25/2013   Procedure: Venia Minks DILATION;  Surgeon: Danie Binder, MD;  Location: [facility] ENDO SUITE;  Service: Endoscopy;  Laterality: N/A;   PARTIAL HYSTERECTOMY     SAVORY DILATION N/A 02/25/2013   Procedure: SAVORY DILATION;  Surgeon: Danie Binder, MD;  Location: [facility] ENDO SUITE;  Service: Endoscopy;  Laterality: N/A;   total knee arthroplasty right  05/29/2005   Dr. Aline Brochure     Allergies  No Known Allergies  History of Present Illness    [name] is a 82 year old female with previously mentioned past medical history of coronary artery disease with a left heart catheterization completed in 03/2014 which revealed 50-70% stenosis to D1, 40 to 50% stenosis in the RCA, nothing appeared flow-limiting at that time, essential hypertension, COPD, gastroesophageal reflux disease, dyslipidemia, tobacco abuse, prediabetes, and a longstanding history of arthritis.  Last echocardiogram revealed LVEF of 50-60% with moderate LVH.  She was last seen in clinic 07/04/2021 by Dr. Harrington Challenger.  At that time she stated that she was doing well.  She had denied any chest pain, her COPD and breathing was stable, denied any dizziness, syncope, or palpitations.  Blood pressure was noted to be high in clinic she stated she was anxious but stated typically they were better at home and had an upcoming follow-up with her PCP where will be rechecked at that time.  Was also recommended she increase  her rosuvastatin to 40 mg daily and recheck her lipids in approximately 8 weeks.  She had several urgent care visits for sinus congestion.  She also had 1 hospital visit to the [facility] emergency department for abnormal labs were potassium was found to be 2.8.   She was currently at that time on Maxide which included HCTZ as well as she takes an additional 25 mg of HCTZ twice daily without potassium supplementation ordered.  Serum creatinine was noted to be 1.25 on labs and she was given 40 mEq of K-dur at the time magnesium level was normal.  She was subsequently discharged home and was advised to maintain follow-up with PCP with repeat labs.  She returns to clinic today stating that overall she has been doing fairly well with the exception of battling with sinus infection that she is currently on antibiotic therapy for.  She denies any chest pain, shortness of breath, palpitations, or peripheral edema.  She does occasionally have some dizziness if she stands too fast.  She states that she has been working on decreasing on her smoking and has gotten it down substantially.  She continues to live at home alone and her children come by to assist with various chores around the house and she states her son does continue to take the trash out.  She also endorses she has a hiatal and ventral hernia.  Home Medications    Current Outpatient Medications  Medication Sig Dispense Refill   amLODipine (NORVASC) 5 MG tablet TAKE 1 TABLET(5 MG) BY MOUTH DAILY 90 tablet 2   fluticasone (FLONASE) 50 MCG/ACT nasal spray Place 2 sprays into both nostrils daily.     Multiple Vitamin (MULTIVITAMIN) tablet Take 1 tablet by mouth daily.     olmesartan (BENICAR) 20 MG tablet Take one tablet by mouth every night at 10 pm for blood pressure 30 tablet 4   triamterene-hydrochlorothiazide (MAXZIDE) 75-50 MG tablet TAKE 1/2 TABLET BY MOUTH IN THE MORNING AT 10:00 AM AND 1/2 TABLET EVERY EVENING AT 10:00 PM 90 tablet 3   amoxicillin-clavulanate (AUGMENTIN) 875-125 MG tablet Take 1 tablet by mouth every 12 (twelve) hours. (Patient not taking: Reported on 08/31/2022) 14 tablet 0   benzonatate (TESSALON PERLES) 100 MG capsule Take 1 capsule (100 mg total) by mouth 3 (three) times daily as needed  for cough. (Patient not taking: Reported on 08/31/2022) 30 capsule 0   cetirizine (ZYRTEC) 5 MG tablet Take 1 tablet (5 mg total) by mouth daily. (Patient not taking: Reported on 08/31/2022) 90 tablet 3   dextromethorphan-guaiFENesin (MUCINEX DM) 30-600 MG 12hr tablet Take 1 tablet by mouth 2 (two) times daily. (Patient not taking: Reported on 08/31/2022)     Omega-3 Fatty Acids (FISH OIL) 1000 MG CAPS Take 100 mg by mouth daily at 6 (six) AM. (Patient not taking: Reported on 08/31/2022)     pantoprazole (PROTONIX) 40 MG tablet Take 40 mg by mouth daily. (Patient not taking: Reported on 08/31/2022)     Potassium Chloride (KLOR-CON PO) Take 20 mEq by mouth daily at 6 (six) AM. (Patient not taking: Reported on 08/31/2022)     rosuvastatin (CRESTOR) 40 MG tablet Take 1 tablet (40 mg total) by mouth daily. (Patient not taking: Reported on 08/31/2022) 90 tablet 3   No current facility-administered medications for this visit.     Family History    Family History  Problem Relation Age of Onset   Diabetes Mother    Hypertension Mother    Coronary artery disease  Mother    Pneumonia Father    Hypertension Sister    Hypertension Sister    Hypertension Brother        Psychologist, forensic   Diabetes Brother    Hypertension Brother    Colon cancer Neg Hx    Liver disease Neg Hx    She indicated that her mother is deceased. She indicated that her father is deceased. She indicated that both of her sisters are alive. She indicated that only one of her two brothers is alive. She indicated that both of her daughters are alive. She indicated that both of her sons are alive. She indicated that the status of her neg hx is unknown.  Social History    Social History   Socioeconomic History   Marital status: Widowed    Spouse name: Not on file   Number of children: 4   Years of education: Not on file   Highest education level: Not on file  Occupational History   Occupation: employed     Employer: RETIRED  Tobacco Use    Smoking status: Some Days    Packs/day: 0.10    Years: 10.00    Total pack years: 1.00    Types: Cigarettes    Start date: 07/23/1962   Smokeless tobacco: Never   Tobacco comments:    1 per day   Vaping Use   Vaping Use: Never used  Substance and Sexual Activity   Alcohol use: No    Alcohol/week: 0.0 standard drinks of alcohol   Drug use: No   Sexual activity: Not Currently  Other Topics Concern   Not on file  Social History Narrative   Not on file   Social Determinants of Health   Financial Resource Strain: Low Risk  (03/28/2022)   Overall Financial Resource Strain (CARDIA)    Difficulty of Paying Living Expenses: Not hard at all  Food Insecurity: No Food Insecurity (03/28/2022)   Hunger Vital Sign    Worried About Running Out of Food in the Last Year: Never true    [place] in the Last Year: Never true  Transportation Needs: No Transportation Needs (03/28/2022)   PRAPARE - Hydrologist (Medical): No    Lack of Transportation (Non-Medical): No  Physical Activity: Insufficiently Active (03/07/2021)   Exercise Vital Sign    Days of Exercise per Week: 3 days    Minutes of Exercise per Session: 30 min  Stress: No Stress Concern Present (03/07/2021)   [place]    Feeling of Stress : Not at all  Social Connections: Moderately Isolated (03/28/2022)   Social Connection and Isolation Panel [NHANES]    Frequency of Communication with Friends and Family: Three times a week    Frequency of Social Gatherings with Friends and Family: Three times a week    Attends Religious Services: More than 4 times per year    Active Member of Clubs or Organizations: No    Attends Archivist Meetings: Never    Marital Status: Widowed  Intimate Partner Violence: Not At Risk (03/07/2021)   Humiliation, Afraid, Rape, and Kick questionnaire    Fear of Current or Ex-Partner: No    Emotionally  Abused: No    Physically Abused: No    Sexually Abused: No     Review of Systems    General:  No chills, fever, night sweats or weight changes.  Endorses occasional fatigue Cardiovascular:  No chest pain, dyspnea on exertion, edema, orthopnea, palpitations, paroxysmal nocturnal dyspnea. Dermatological: No rash, lesions/masses Respiratory: No cough, dyspnea Urologic: No hematuria, dysuria Abdominal:   No nausea, vomiting, diarrhea, bright red blood per rectum, melena, or hematemesis, endorses hiatal hernia Neurologic:  No visual changes, wkns, changes in mental status. All other systems reviewed and are otherwise negative except as noted above.   Physical Exam    VS:  BP 134/88   Pulse 93   Ht 5' 7"$  (1.702 m)   Wt 176 lb 9.6 oz (80.1 kg)   SpO2 99%   BMI 27.66 kg/m  , BMI Body mass index is 27.66 kg/m.     GEN: Well nourished, well developed, in no acute distress. HEENT: normal. Neck: Supple, no JVD, carotid bruits, or masses. Cardiac: RRR, no murmurs, rubs, or gallops. No clubbing, cyanosis, edema.  Radials 2+/PT 2+ and equal bilaterally.  Respiratory:  Respirations regular and unlabored, clear to auscultation bilaterally. GI: Soft, nontender, nondistended, BS + x 4. MS: no deformity or atrophy. Skin: warm and dry, no rash. Neuro:  Strength and sensation are intact. Psych: Normal affect.  Accessory Clinical Findings    ECG personally reviewed by me today-sinus rhythm with a rate of 85, LVH- No acute changes  Lab Results  Component Value Date   WBC 7.4 08/24/2021   HGB 14.9 08/24/2021   HCT 42.5 08/24/2021   MCV 86.0 08/24/2021   PLT 287 08/24/2021   Lab Results  Component Value Date   CREATININE 1.50 (H) 04/17/2022   BUN 15 04/17/2022   NA 139 04/17/2022   K 4.3 04/17/2022   CL 98 04/17/2022   CO2 24 04/17/2022   Lab Results  Component Value Date   ALT 8 04/17/2022   AST 17 04/17/2022   ALKPHOS 105 04/17/2022   BILITOT 0.6 04/17/2022   Lab Results   Component Value Date   CHOL 170 04/17/2022   HDL 47 04/17/2022   LDLCALC 98 04/17/2022   TRIG 142 04/17/2022   CHOLHDL 3.6 04/17/2022    Lab Results  Component Value Date   HGBA1C 6.0 (H) 04/17/2022    Assessment & Plan   1.  Coronary artery disease of the last cath in 2015 that showed moderate CAD.  She continues to remain asymptomatic denies any chest pain or anginal equivalents of shortness of breath or dyspnea on exertion.  EKG today revealed sinus rhythm without any ischemic changes noted.  She is continued on rosuvastatin 40 mg daily and since starting Zetia 10 mg daily today.  She is also encouraged to restart aspirin 81 mg daily.  2.  Essential hypertension with blood pressure today of 134/88.  Patient states that she has been compliant with her medications.  She is continued on amlodipine 5 mg daily, olmesartan 20 mg daily, interim during HCTZ 75/50 mg at half a tablet in the morning and half a tablet in the evening.  She is encouraged to continue to monitor her blood pressures at home.  She is also been advised to continue to limit her sodium intake.  3.  Hyperlipidemia with last LDL of 98.  Previously her rosuvastatin was increased to 40 mg daily as her LDL goal is 70 or less.  With an increase in her most recent LDL we are adding ezetimibe 10 mg daily to her regimen.  She will need a follow-up lipid panel and LFTs in 3 months.  4.  Nicotine excess she continues to smoke.  She states that  she has decreased the amount of substantially.  Admits she currently has no desire to stop smoking altogether.  5.  Disposition patient return to clinic to see MD/APP in 1 year or sooner if needed.  Laura Adamski, NP 08/31/2022, 2:51 PM

## 2022-09-06 ENCOUNTER — Other Ambulatory Visit: Payer: Self-pay

## 2022-09-06 ENCOUNTER — Encounter: Payer: Self-pay | Admitting: Family Medicine

## 2022-09-06 ENCOUNTER — Ambulatory Visit (INDEPENDENT_AMBULATORY_CARE_PROVIDER_SITE_OTHER): Payer: Medicare Other | Admitting: Family Medicine

## 2022-09-06 VITALS — BP 142/82 | HR 94 | Ht 65.0 in | Wt 171.1 lb

## 2022-09-06 DIAGNOSIS — E785 Hyperlipidemia, unspecified: Secondary | ICD-10-CM | POA: Diagnosis not present

## 2022-09-06 DIAGNOSIS — E559 Vitamin D deficiency, unspecified: Secondary | ICD-10-CM | POA: Diagnosis not present

## 2022-09-06 DIAGNOSIS — F17210 Nicotine dependence, cigarettes, uncomplicated: Secondary | ICD-10-CM

## 2022-09-06 DIAGNOSIS — N1832 Chronic kidney disease, stage 3b: Secondary | ICD-10-CM

## 2022-09-06 DIAGNOSIS — R7303 Prediabetes: Secondary | ICD-10-CM

## 2022-09-06 DIAGNOSIS — I10 Essential (primary) hypertension: Secondary | ICD-10-CM | POA: Diagnosis not present

## 2022-09-06 DIAGNOSIS — F17200 Nicotine dependence, unspecified, uncomplicated: Secondary | ICD-10-CM

## 2022-09-06 DIAGNOSIS — J302 Other seasonal allergic rhinitis: Secondary | ICD-10-CM | POA: Diagnosis not present

## 2022-09-06 DIAGNOSIS — K219 Gastro-esophageal reflux disease without esophagitis: Secondary | ICD-10-CM | POA: Diagnosis not present

## 2022-09-06 DIAGNOSIS — E663 Overweight: Secondary | ICD-10-CM

## 2022-09-06 MED ORDER — CETIRIZINE HCL 5 MG PO TABS: ORAL_TABLET | ORAL | 5 refills | Status: DC

## 2022-09-06 MED ORDER — PREDNISONE 5 MG PO TABS: 5.0000 mg | ORAL_TABLET | Freq: Two times a day (BID) | ORAL | 0 refills | Status: AC

## 2022-09-06 NOTE — Patient Instructions (Addendum)
Annual exam in July, call if you need me sooner  Fasting labs second week in May, CBC lipid CMP and EGFR HbA1c TSH and vitamin D.  Short course of prednisone for 5 days as prescribed, I also recommend increasing your zyrtec to twice daily for the next week and then cut back to once a day if this will control your allergy symptoms.  Continue to work on stopping smoking cigarettes to protect your health and improve it.  Thanks for choosing [facility], we consider it a privelige to serve you.

## 2022-09-09 NOTE — Assessment & Plan Note (Signed)
Asked:confirms currently smokes cigarettes °Assess: Unwilling to set a quit date, but is cutting back °Advise: needs to QUIT to reduce risk of cancer, cardio and cerebrovascular disease °Assist: counseled for 5 minutes and literature provided °Arrange: follow up in 2 to 4 months ° °

## 2022-09-09 NOTE — Assessment & Plan Note (Signed)
Above goal, no med change, has not taken mid day meds , no change DASH diet and commitment to daily physical activity for a minimum of 30 minutes discussed and encouraged, as a part of hypertension management. The importance of attaining a healthy weight is also discussed.     09/06/2022    1:12 PM 09/06/2022    1:08 PM 08/31/2022    2:17 PM 04/25/2022    1:48 PM 04/25/2022    1:28 PM 04/17/2022   12:01 PM 01/30/2022   11:04 AM  BP/Weight  Systolic BP A999333 99991111 Q000111Q 99991111 [id number] [id number]  Diastolic BP 82 76 88 72 72 76 82  Wt. (Lbs)  171.08 176.6  172    BMI  28.47 kg/m2 27.66 kg/m2  26.94 kg/m2

## 2022-09-09 NOTE — Assessment & Plan Note (Signed)
Reminded to avoid NSAIDS

## 2022-09-09 NOTE — Assessment & Plan Note (Signed)
Patient educated about the importance of limiting  Carbohydrate intake , the need to commit to daily physical activity for a minimum of 30 minutes , and to commit weight loss. The fact that changes in all these areas will reduce or eliminate all together the development of diabetes is stressed.      Latest Ref Rng & Units 04/17/2022   11:31 AM 09/12/2021   11:27 AM 08/30/2021    9:25 AM 08/24/2021   12:23 PM 08/24/2021   10:14 AM  Diabetic Labs  HbA1c 4.8 - 5.6 % 6.0     6.1   Chol 100 - 199 mg/dL 170  142      HDL >39 mg/dL 47  48      Calc LDL 0 - 99 mg/dL 98  73      Triglycerides 0 - 149 mg/dL 142  114      Creatinine 0.57 - 1.00 mg/dL 1.50  1.26  1.39  1.25        09/06/2022    1:12 PM 09/06/2022    1:08 PM 08/31/2022    2:17 PM 04/25/2022    1:48 PM 04/25/2022    1:28 PM 04/17/2022   12:01 PM 01/30/2022   11:04 AM  BP/Weight  Systolic BP A999333 99991111 Q000111Q 99991111 [id number] 123456 [id number]  Diastolic BP 82 76 88 72 72 76 82  Wt. (Lbs)  171.08 176.6  172    BMI  28.47 kg/m2 27.66 kg/m2  26.94 kg/m2        05/02/2010   12:00 AM  Foot/eye exam completion dates  Foot exam Order yes         This result is from an external source.    Updated lab needed at/ before next visit.

## 2022-09-09 NOTE — Assessment & Plan Note (Signed)
Hyperlipidemia:Low fat diet discussed and encouraged.   Lipid Panel  Lab Results  Component Value Date   CHOL 170 04/17/2022   HDL 47 04/17/2022   LDLCALC 98 04/17/2022   TRIG 142 04/17/2022   CHOLHDL 3.6 04/17/2022     Updated lab needed at/ before next visit.

## 2022-09-09 NOTE — Assessment & Plan Note (Signed)
Increased and uncontrolled symptoms, short course of prednisone and double zyrtec for 5 days

## 2022-09-09 NOTE — Assessment & Plan Note (Signed)
Controlled, no change in medication  

## 2022-09-09 NOTE — Assessment & Plan Note (Signed)
  Patient re-educated about  the importance of commitment to a  minimum of 150 minutes of exercise per week as able.  The importance of healthy food choices with portion control discussed, as well as eating regularly and within a 12 hour window most days. The need to choose "clean , green" food 50 to 75% of the time is discussed, as well as to make water the primary drink and set a goal of 64 ounces water daily.       09/06/2022    1:08 PM 08/31/2022    2:17 PM 04/25/2022    1:28 PM  Weight /BMI  Weight 171 lb 1.3 oz 176 lb 9.6 oz 172 lb  Height 5' 5"$  (1.651 m) 5' 7"$  (1.702 m) 5' 7"$  (1.702 m)  BMI 28.47 kg/m2 27.66 kg/m2 26.94 kg/m2

## 2022-09-09 NOTE — Progress Notes (Signed)
[name]     MRN: [id number]      DOB: January 29, 1941   HPI [name] is here for follow up and re-evaluation of chronic medical conditions, medication management and review of any available recent lab and radiology data.  Preventive health is updated, specifically  Cancer screening and Immunization.   Questions or concerns regarding consultations or procedures which the PT has had in the interim are  addressed. The PT denies any adverse reactions to current medications since the last visit.  2 week h/o runny nose and sinus headaches, no fever or chills   ROS Denies recent fever or chills. Denies  ear pain or sore throat. Denies chest congestion, productive cough or wheezing. Denies chest pains, palpitations and leg swelling Denies abdominal pain, nausea, vomiting,diarrhea or constipation.   Denies dysuria, frequency, hesitancy or incontinence. Denies uncontrolled  joint pain, swelling and limitation in mobility. Denies headaches, seizures, numbness, or tingling. Denies depression, anxiety or insomnia. Denies skin break down or rash.   PE  BP (!) 142/82 (BP Location: Left Arm, Patient Position: Sitting, Cuff Size: Normal)   Pulse 94   Ht 5' 5"$  (1.651 m)   Wt 171 lb 1.3 oz (77.6 kg)   SpO2 93%   BMI 28.47 kg/m   Patient alert and oriented and in no cardiopulmonary distress.  HEENT: No facial asymmetry, EOMI,     Neck supple .  Chest: Clear to auscultation bilaterally.  CVS: S1, S2 no murmurs, no S3.Regular rate.  ABD: Soft non tender.   Ext: No edema  MS: Adequate though reduced ROM spine, shoulders, hips and knees.  Skin: Intact, no ulcerations or rash noted.  Psych: Good eye contact, normal affect. Memory intact not anxious or depressed appearing.  CNS: CN 2-12 intact, power,  normal throughout.no focal deficits noted.   Assessment & Plan  Essential hypertension Above goal, no med change, has not taken mid day meds , no change DASH diet and commitment  to daily physical activity for a minimum of 30 minutes discussed and encouraged, as a part of hypertension management. The importance of attaining a healthy weight is also discussed.     09/06/2022    1:12 PM 09/06/2022    1:08 PM 08/31/2022    2:17 PM 04/25/2022    1:48 PM 04/25/2022    1:28 PM 04/17/2022   12:01 PM 01/30/2022   11:04 AM  BP/Weight  Systolic BP A999333 99991111 Q000111Q 99991111 [id number] 123456 Q000111Q  Diastolic BP 82 76 88 72 72 76 82  Wt. (Lbs)  171.08 176.6  172    BMI  28.47 kg/m2 27.66 kg/m2  26.94 kg/m2         Seasonal allergies Increased and uncontrolled symptoms, short course of prednisone and double zyrtec for 5 days  Prediabetes Patient educated about the importance of limiting  Carbohydrate intake , the need to commit to daily physical activity for a minimum of 30 minutes , and to commit weight loss. The fact that changes in all these areas will reduce or eliminate all together the development of diabetes is stressed.      Latest Ref Rng & Units 04/17/2022   11:31 AM 09/12/2021   11:27 AM 08/30/2021    9:25 AM 08/24/2021   12:23 PM 08/24/2021   10:14 AM  Diabetic Labs  HbA1c 4.8 - 5.6 % 6.0     6.1   Chol 100 - 199 mg/dL 170  142      HDL >39  mg/dL 47  48      Calc LDL 0 - 99 mg/dL 98  73      Triglycerides 0 - 149 mg/dL 142  114      Creatinine 0.57 - 1.00 mg/dL 1.50  1.26  1.39  1.25        09/06/2022    1:12 PM 09/06/2022    1:08 PM 08/31/2022    2:17 PM 04/25/2022    1:48 PM 04/25/2022    1:28 PM 04/17/2022   12:01 PM 01/30/2022   11:04 AM  BP/Weight  Systolic BP A999333 99991111 Q000111Q 99991111 [id number] 123456 Q000111Q  Diastolic BP 82 76 88 72 72 76 82  Wt. (Lbs)  171.08 176.6  172    BMI  28.47 kg/m2 27.66 kg/m2  26.94 kg/m2        05/02/2010   12:00 AM  Foot/eye exam completion dates  Foot exam Order yes         This result is from an external source.    Updated lab needed at/ before next visit.   Dyslipidemia (high LDL; low HDL) Hyperlipidemia:Low fat diet discussed and  encouraged.   Lipid Panel  Lab Results  Component Value Date   CHOL 170 04/17/2022   HDL 47 04/17/2022   LDLCALC 98 04/17/2022   TRIG 142 04/17/2022   CHOLHDL 3.6 04/17/2022     Updated lab needed at/ before next visit.   GERD (gastroesophageal reflux disease) Controlled, no change in medication   NICOTINE ADDICTION Asked:confirms currently smokes cigarettes Assess: Unwilling to set a quit date, but is cutting back Advise: needs to QUIT to reduce risk of cancer, cardio and cerebrovascular disease Assist: counseled for 5 minutes and literature provided Arrange: follow up in 2 to 4 months   Overweight (BMI 25.0-29.9)  Patient re-educated about  the importance of commitment to a  minimum of 150 minutes of exercise per week as able.  The importance of healthy food choices with portion control discussed, as well as eating regularly and within a 12 hour window most days. The need to choose "clean , green" food 50 to 75% of the time is discussed, as well as to make water the primary drink and set a goal of 64 ounces water daily.       09/06/2022    1:08 PM 08/31/2022    2:17 PM 04/25/2022    1:28 PM  Weight /BMI  Weight 171 lb 1.3 oz 176 lb 9.6 oz 172 lb  Height 5' 5"$  (1.651 m) 5' 7"$  (1.702 m) 5' 7"$  (1.702 m)  BMI 28.47 kg/m2 27.66 kg/m2 26.94 kg/m2      CKD (chronic kidney disease) stage 3, GFR 30-59 ml/min (HCC) Reminded to avoid NSAIDS

## 2022-09-20 ENCOUNTER — Encounter: Payer: Self-pay | Admitting: Radiology

## 2022-11-26 DIAGNOSIS — J019 Acute sinusitis, unspecified: Secondary | ICD-10-CM | POA: Diagnosis not present

## 2022-12-07 ENCOUNTER — Encounter: Payer: Self-pay | Admitting: Internal Medicine

## 2022-12-25 ENCOUNTER — Ambulatory Visit (INDEPENDENT_AMBULATORY_CARE_PROVIDER_SITE_OTHER): Payer: Medicare Other | Admitting: Gastroenterology

## 2022-12-25 ENCOUNTER — Encounter: Payer: Self-pay | Admitting: Gastroenterology

## 2022-12-25 ENCOUNTER — Telehealth: Payer: Self-pay | Admitting: *Deleted

## 2022-12-25 VITALS — BP 166/89 | HR 72 | Temp 97.5°F | Ht 65.0 in | Wt 177.4 lb

## 2022-12-25 DIAGNOSIS — R140 Abdominal distension (gaseous): Secondary | ICD-10-CM | POA: Diagnosis not present

## 2022-12-25 DIAGNOSIS — K219 Gastro-esophageal reflux disease without esophagitis: Secondary | ICD-10-CM | POA: Diagnosis not present

## 2022-12-25 DIAGNOSIS — K5900 Constipation, unspecified: Secondary | ICD-10-CM | POA: Diagnosis not present

## 2022-12-25 DIAGNOSIS — Z86010 Personal history of colonic polyps: Secondary | ICD-10-CM

## 2022-12-25 DIAGNOSIS — D125 Benign neoplasm of sigmoid colon: Secondary | ICD-10-CM

## 2022-12-25 MED ORDER — LUBIPROSTONE 8 MCG PO CAPS: 8.0000 ug | ORAL_CAPSULE | Freq: Two times a day (BID) | ORAL | 2 refills | Status: DC

## 2022-12-25 MED ORDER — PEG 3350-KCL-NA BICARB-NACL 420 G PO SOLR: 4000.0000 mL | Freq: Once | ORAL | 0 refills | Status: AC

## 2022-12-25 NOTE — Patient Instructions (Addendum)
Start Amitiza 8 mcg twice daily with food for constipation. If you have to many loose stools a day on this manage may reduce to once daily.  If for any reason you are unable to get Amitiza due to cost or if not covered by her insurance you to start taking MiraLAX 17 g (1 capful) once daily in 8 ounces of water.  You may continue to use milk of magnesia as needed for constipation.  Please continue taking your pantoprazole 40 mg once daily.  We will schedule you for colonoscopy in the near future with Dr. Marletta Lor.  Will plan to see you in about 3 months, sooner if needed.  Please reach out if your abdominal pain worsens.  I encourage you to see your primary care regarding your cough and mucus production.  It was a pleasure to see you today. I want to create trusting relationships with patients. If you receive a survey regarding your visit,  I greatly appreciate you taking time to fill this out on paper or through your MyChart. I value your feedback.  Brooke Bonito, MSN, FNP-BC, AGACNP-BC [facility]

## 2022-12-25 NOTE — Telephone Encounter (Signed)
Called pt. Scheduled for TCS with Dr. Marletta Lor, ASA 2 on 7/2. Aware will send instructions to her. Rx for prep sent to her pharmacy. Aware needs lab work prior. Order placed.  She also stated the medication sent in today Amitiza was $42. Please advise Courtney. Thanks!

## 2022-12-25 NOTE — Progress Notes (Signed)
GI Office Note    Referring Provider: Kerri Perches, MD Primary Care Physician:  Kerri Perches, MD Primary Gastroenterologist: Hennie Duos. Marletta Lor, DO  Date:  12/25/2022  ID:  [name], DOB [birth year], MRN [id number]   Chief Complaint   Chief Complaint  Patient presents with   Follow-up    Patient here today for a follow up on her Genella Rife. Genella Rife is controlled per patient on the pantoprazole 40 mg once per day.    History of Present Illness  [name] is a 82 y.o. female with a history of chronic GERD, hiatal hernia, COPD, HTN, and osteoarthritis presenting today for annual follow-up of GERD.  Last colonoscopy in 2014 with 1 small tubular adenoma removed from the sigmoid colon.  Recommended repeat in 10 years.  Last office visit 01/03/2022.  GERD controlled on pantoprazole once daily.  Does not need refills.  Also with chronic constipation.  Did note some intermittent abdominal bloating.  Previously given Linzess samples but she was unable to afford this.  Taking milk of magnesia as needed which controls her symptoms.  No melena or BRBPR.  She was advised to continue milk of magnesia as needed for constipation and PPI once daily for reflux.  Advised to discuss risk versus benefits of repeat colonoscopy at next visit.   Today: She states when she coughs she has the feeling in her abdomen at hernia site that something is pressing on it and then she says she feels like it releases and it goes down. Has been coughing up some phlegm at night and at times it is a yellowish color. She has to really contract her throat to be able to get it up. Is due to see Dr. Lodema Hong next month and have some lab work. Does not take her allergy medicine all the time. About 1 month ago she took antibiotics and some prednisone for the same symptoms. Went to urgent care near [place] on [address].   Reflux is doing well on her pantoprazole and is working on avoiding triggers.   Constipation - Had a  lot of gas this morning and felt gas move from the middle abdomen to lower abdomen. Ate steamed cabbage and ate rice and broccoli little bits as a time. Also had milk and and a banana. Has been taking gas ex as needed. Drinks ginger ale to help belch and help with gas as well. May at times drink some vinegar mixed with water to help with gas. Uses milk of magnesia as needed. Unable to afford Linzess. Has not tried anything else.   Denis chest pain, shortness of breath.   Current Outpatient Medications  Medication Sig Dispense Refill   amLODipine (NORVASC) 5 MG tablet TAKE 1 TABLET(5 MG) BY MOUTH DAILY 90 tablet 2   aspirin EC 81 MG tablet Take 81 mg by mouth daily. Swallow whole.     cetirizine (ZYRTEC) 5 MG tablet Take one tablet by mouth two times daily as needed , for uncontrolled allergy symptoms 60 tablet 5   ezetimibe (ZETIA) 10 MG tablet Take 1 tablet (10 mg total) by mouth daily. 90 tablet 3   fluticasone (FLONASE) 50 MCG/ACT nasal spray Place 2 sprays into both nostrils daily.     Multiple Vitamin (MULTIVITAMIN) tablet Take 1 tablet by mouth daily.     olmesartan (BENICAR) 20 MG tablet Take one tablet by mouth every night at 10 pm for blood pressure 30 tablet 4   pantoprazole (PROTONIX) 40  MG tablet Take 40 mg by mouth daily.     potassium chloride (KLOR-CON) 10 MEQ tablet Take 20 mEq by mouth daily.     rosuvastatin (CRESTOR) 40 MG tablet Take 40 mg by mouth daily.     triamterene-hydrochlorothiazide (MAXZIDE) 75-50 MG tablet TAKE 1/2 TABLET BY MOUTH IN THE MORNING AT 10:00 AM AND 1/2 TABLET EVERY EVENING AT 10:00 PM 90 tablet 3   No current facility-administered medications for this visit.    Past Medical History:  Diagnosis Date   COPD (chronic obstructive pulmonary disease) (HCC)    Hiatal hernia without gangrene and obstruction 03/23/2015   Hypertension 2004   Lung nodule seen on imaging study    Nicotine addiction    OA (osteoarthritis) of knee    Obesity    Sinusitis      Past Surgical History:  Procedure Laterality Date   bilateral cataract surgery  7/27 & 03/01/09   Dr. Nile Riggs     cholecystectomy     COLONOSCOPY  04/2004   Dr. Jerolyn Shin Smith-->melanosis coli   COLONOSCOPY WITH ESOPHAGOGASTRODUODENOSCOPY (EGD) N/A 02/25/2013   Dr. Fields:Normal mucosa in the terminal ileum/Severe melanosis throughout the entire examined colon/ONE COLON POLYP REMOVED (tubular adenoma)/Mild diverticulosis  in the sigmoid colon/EGD:The mucosa of the esophagus appeared normal/Non-erosive gastritis, empiric dilation with Savary dilator   ESOPHAGOGASTRODUODENOSCOPY  11/07/2010   Jenkins:hiatal hernia/no evidence of Barrett esophagitis.  The Z-line was noted to be at 39 cm from the teeth. CLO test negative   ESOPHAGOGASTRODUODENOSCOPY N/A 09/11/2013   Dr.Fields-  incomplete esophageal web in mid-esophagus, medium sized hialtal hernia, PUD. bx=focal erosion with inflammation and fibrosis   ESOPHAGOGASTRODUODENOSCOPY N/A 07/06/2014   healed ulcers, persistent erosions on aspirin, small hiatal hernia   ESOPHAGOGASTRODUODENOSCOPY N/A 02/05/2020   normal esophagus s/p dilation, normal stomach, normal duodenal bulb.    knee arthroscopy right     LEFT HEART CATHETERIZATION WITH CORONARY ANGIOGRAM N/A 04/08/2014   Procedure: LEFT HEART CATHETERIZATION WITH CORONARY ANGIOGRAM;  Surgeon: Lesleigh Noe, MD;  Location: [facility] CATH LAB;  Service: Cardiovascular;  Laterality: N/A;   MALONEY DILATION N/A 02/25/2013   Procedure: Elease Hashimoto DILATION;  Surgeon: West Bali, MD;  Location: [facility] ENDO SUITE;  Service: Endoscopy;  Laterality: N/A;   PARTIAL HYSTERECTOMY     SAVORY DILATION N/A 02/25/2013   Procedure: SAVORY DILATION;  Surgeon: West Bali, MD;  Location: [facility] END[facility] SUITE;  Service: Endoscopy;  Laterality: N/A;   total knee arthroplasty right  05/29/2005   Dr. Romeo Apple     Family History  Problem Relation Age of Onset   Diabetes Mother    Hypertension Mother    Coronary artery disease  Mother    Pneumonia Father    Hypertension Sister    Hypertension Sister    Hypertension Brother        pace maker   Diabetes Brother    Hypertension Brother    Colon cancer Neg Hx    Liver disease Neg Hx     Allergies as of 12/25/2022   (No Known Allergies)    Social History   Socioeconomic History   Marital status: Widowed    Spouse name: Not on file   Number of children: 4   Years of education: Not on file   Highest education level: Not on file  Occupational History   Occupation: employed     Employer: RETIRED  Tobacco Use   Smoking status: Some Days    Packs/day: 0.10  Years: 10.00    Additional pack years: 0.00    Total pack years: 1.00    Types: Cigarettes    Start date: 07/23/1962   Smokeless tobacco: Never   Tobacco comments:    1 per day   Vaping Use   Vaping Use: Never used  Substance and Sexual Activity   Alcohol use: No    Alcohol/week: 0.0 standard drinks of alcohol   Drug use: No   Sexual activity: Not Currently  Other Topics Concern   Not on file  Social History Narrative   Not on file   Social Determinants of Health   Financial Resource Strain: Low Risk  (03/28/2022)   Overall Financial Resource Strain (CARDIA)    Difficulty of Paying Living Expenses: Not hard at all  Food Insecurity: No Food Insecurity (03/28/2022)   Hunger Vital Sign    Worried About Running Out of Food in the Last Year: Never true    Ran Out of Food in the Last Year: Never true  Transportation Needs: No Transportation Needs (03/28/2022)   PRAPARE - Administrator, Civil Service (Medical): No    Lack of Transportation (Non-Medical): No  Physical Activity: Insufficiently Active (03/07/2021)   Exercise Vital Sign    Days of Exercise per Week: 3 days    Minutes of Exercise per Session: 30 min  Stress: No Stress Concern Present (03/07/2021)   [employer] of Occupational Health - Occupational Stress Questionnaire    Feeling of Stress : Not at all  Social  Connections: Moderately Isolated (03/28/2022)   Social Connection and Isolation Panel [NHANES]    Frequency of Communication with Friends and Family: Three times a week    Frequency of Social Gatherings with Friends and Family: Three times a week    Attends Religious Services: More than 4 times per year    Active Member of Clubs or Organizations: No    Attends Banker Meetings: Never    Marital Status: Widowed     Review of Systems   Gen: Denies fever, chills, anorexia. Denies fatigue, weakness, weight loss.  CV: Denies chest pain, palpitations, syncope, peripheral edema, and claudication. Resp: Denies dyspnea at rest, cough, wheezing, coughing up blood, and pleurisy. GI: See HPI Derm: Denies rash, itching, dry skin Psych: Denies depression, anxiety, memory loss, confusion. No homicidal or suicidal ideation.  Heme: Denies bruising, bleeding, and enlarged lymph nodes.   Physical Exam   BP (!) 166/89 (BP Location: Left Arm, Patient Position: Sitting, Cuff Size: Large)   Pulse 72   Temp (!) 97.5 F (36.4 C) (Temporal)   Ht 5\' 5"  (1.651 m)   Wt 177 lb 6.4 oz (80.5 kg)   BMI 29.52 kg/m   General:   Alert and oriented. No distress noted. Pleasant and cooperative.  Head:  Normocephalic and atraumatic. Eyes:  Conjuctiva clear without scleral icterus. Mouth:  Oral mucosa pink and moist. Good dentition. No lesions. Lungs:  Clear to auscultation bilaterally. No wheezes, rales, or rhonchi. No distress.  Heart:  S1, S2 present without murmurs appreciated.  Abdomen:  +BS, soft, non-tender and non-distended. No rebound or guarding. No HSM or masses noted. Rectal: deferred Msk:  Symmetrical without gross deformities. Normal posture. Extremities:  Without edema. Neurologic:  Alert and  oriented x4 Psych:  Alert and cooperative. Normal mood and affect.   Assessment  [name] is a 82 y.o. female with a history of chronic GERD, hiatal hernia, COPD, HTN, and osteoarthritis  presenting  today for annual follow-up of GERD.  GERD: Well-controlled with pantoprazole 40 mg once daily.   Constipation, bloating: Having some occasional issues with constipation.  Uses milk of magnesia as needed but continues to have some bloating and gas.  Uses Gas-X as needed.  Encouraged to continue this.  Advised to avoid typical gas-producing foods such as cabbage and broccoli which she eats fairly regularly.  Linzess has been too expensive in the past therefore we will trial Amitiza.  If Amitiza is too expensive I encouraged her to do MiraLAX daily.  History of adenomatous colon polyps: Last colonoscopy in 2014 with 1 small tubular adenoma removed from the sigmoid colon.  She is in great health currently and although she has constipation she has no other alarm symptoms.  She appears to be in stable health to undergo repeat colonoscopy.  We discussed the risk and benefits of the procedure and she has elected to proceed with colonoscopy.  Advised her that we may not be able to find the cause of her intermittent abdominal discomfort but we would at least rule out colon polyps or any malignancy.  PLAN   Proceed with colonoscopy with propofol by Dr. Marletta Lor  in near future: the risks, benefits, and alternatives have been discussed with the patient in detail. The patient states understanding and desires to proceed. ASA 2 BMP pre-op Continue pantoprazole 40 mg once daily.  Start Amitiza 8 mcg 1-2 times daily.  Will do miralax trial if no improvement with amitiza. Continue milk of magnesia as needed Continue Gas-X as needed Follow up in 3 months.     Brooke Bonito, MSN, FNP-BC, AGACNP-BC [facility]

## 2022-12-26 NOTE — Telephone Encounter (Signed)
Spoke to pt, informed her to come by office and pick samples and patient assistance forms. Pt voiced understanding.

## 2023-01-03 DIAGNOSIS — I10 Essential (primary) hypertension: Secondary | ICD-10-CM | POA: Diagnosis not present

## 2023-01-03 DIAGNOSIS — E559 Vitamin D deficiency, unspecified: Secondary | ICD-10-CM | POA: Diagnosis not present

## 2023-01-03 DIAGNOSIS — E785 Hyperlipidemia, unspecified: Secondary | ICD-10-CM | POA: Diagnosis not present

## 2023-01-03 DIAGNOSIS — R7303 Prediabetes: Secondary | ICD-10-CM | POA: Diagnosis not present

## 2023-01-04 LAB — CBC
Hematocrit: 40.6 % (ref 34.0–46.6)
Hemoglobin: 13.4 g/dL (ref 11.1–15.9)
MCH: 29.6 pg (ref 26.6–33.0)
MCHC: 33 g/dL (ref 31.5–35.7)
MCV: 90 fL (ref 79–97)
Platelets: 308 10*3/uL (ref 150–450)
RBC: 4.52 x10E6/uL (ref 3.77–5.28)
RDW: 14.2 % (ref 11.7–15.4)
WBC: 10.4 10*3/uL (ref 3.4–10.8)

## 2023-01-04 LAB — CMP14+EGFR
ALT: 7 IU/L (ref 0–32)
AST: 10 IU/L (ref 0–40)
Albumin/Globulin Ratio: 1.7
Albumin: 4.5 g/dL (ref 3.8–4.8)
Alkaline Phosphatase: 114 IU/L (ref 44–121)
BUN/Creatinine Ratio: 11 — ABNORMAL LOW (ref 12–28)
BUN: 17 mg/dL (ref 8–27)
Bilirubin Total: 0.5 mg/dL (ref 0.0–1.2)
CO2: 21 mmol/L (ref 20–29)
Calcium: 8.9 mg/dL (ref 8.7–10.3)
Chloride: 103 mmol/L (ref 96–106)
Creatinine, Ser: 1.49 mg/dL — ABNORMAL HIGH (ref 0.57–1.00)
Globulin, Total: 2.7 g/dL (ref 1.5–4.5)
Glucose: 112 mg/dL — ABNORMAL HIGH (ref 70–99)
Potassium: 4.5 mmol/L (ref 3.5–5.2)
Sodium: 140 mmol/L (ref 134–144)
Total Protein: 7.2 g/dL (ref 6.0–8.5)
eGFR: 35 mL/min/{1.73_m2} — ABNORMAL LOW (ref >59)

## 2023-01-04 LAB — TSH: TSH: 0.781 u[IU]/mL (ref 0.450–4.500)

## 2023-01-04 LAB — LIPID PANEL
Chol/HDL Ratio: 2.7 ratio (ref 0.0–4.4)
Cholesterol, Total: 146 mg/dL (ref 100–199)
HDL: 54 mg/dL (ref >39)
LDL Chol Calc (NIH): 73 mg/dL (ref 0–99)
Triglycerides: 104 mg/dL (ref 0–149)
VLDL Cholesterol Cal: 19 mg/dL (ref 5–40)

## 2023-01-04 LAB — HEMOGLOBIN A1C
Est. average glucose Bld gHb Est-mCnc: 120 mg/dL
Hgb A1c MFr Bld: 5.8 % — ABNORMAL HIGH (ref 4.8–5.6)

## 2023-01-04 LAB — VITAMIN D 25 HYDROXY (VIT D DEFICIENCY, FRACTURES): Vit D, 25-Hydroxy: 25.8 ng/mL — ABNORMAL LOW (ref 30.0–100.0)

## 2023-01-05 ENCOUNTER — Other Ambulatory Visit: Payer: Self-pay | Admitting: Family Medicine

## 2023-01-14 ENCOUNTER — Telehealth: Payer: Self-pay | Admitting: *Deleted

## 2023-01-14 NOTE — Telephone Encounter (Signed)
Spoke with pt. Aware we needed to reschedule procedure on for 7/2. We have rescheduled to 7/9. Aware will send new instructions via mychart.

## 2023-01-15 ENCOUNTER — Other Ambulatory Visit (HOSPITAL_COMMUNITY)
Admission: RE | Admit: 2023-01-15 | Discharge: 2023-01-15 | Disposition: A | Discharge status: Home / Self Care | Payer: Medicare Other | Source: Ambulatory Visit | Attending: Internal Medicine | Admitting: Internal Medicine

## 2023-01-15 DIAGNOSIS — Z86010 Personal history of colonic polyps: Secondary | ICD-10-CM | POA: Diagnosis not present

## 2023-01-15 LAB — BASIC METABOLIC PANEL
Anion gap: 8 (ref 5–15)
BUN: 21 mg/dL (ref 8–23)
CO2: 26 mmol/L (ref 22–32)
Calcium: 8.6 mg/dL — ABNORMAL LOW (ref 8.9–10.3)
Chloride: 103 mmol/L (ref 98–111)
Creatinine, Ser: 1.55 mg/dL — ABNORMAL HIGH (ref 0.44–1.00)
GFR, Estimated: 34 mL/min — ABNORMAL LOW (ref >60)
Glucose, Bld: 104 mg/dL — ABNORMAL HIGH (ref 70–99)
Potassium: 3.6 mmol/L (ref 3.5–5.1)
Sodium: 137 mmol/L (ref 135–145)

## 2023-01-29 ENCOUNTER — Encounter (HOSPITAL_COMMUNITY)
Admission: RE | Disposition: A | Discharge status: Home / Self Care | Payer: Self-pay | Source: Home / Self Care | Attending: Internal Medicine

## 2023-01-29 ENCOUNTER — Other Ambulatory Visit: Payer: Self-pay

## 2023-01-29 ENCOUNTER — Ambulatory Visit (HOSPITAL_COMMUNITY)
Admission: RE | Admit: 2023-01-29 | Discharge: 2023-01-29 | Disposition: A | Discharge status: Home / Self Care | Payer: Medicare Other | Attending: Internal Medicine | Admitting: Internal Medicine

## 2023-01-29 ENCOUNTER — Encounter (HOSPITAL_COMMUNITY): Payer: Self-pay

## 2023-01-29 ENCOUNTER — Ambulatory Visit (HOSPITAL_COMMUNITY): Payer: Self-pay | Admitting: Anesthesiology

## 2023-01-29 ENCOUNTER — Ambulatory Visit (HOSPITAL_BASED_OUTPATIENT_CLINIC_OR_DEPARTMENT_OTHER): Payer: Medicare Other | Admitting: Anesthesiology

## 2023-01-29 DIAGNOSIS — F17210 Nicotine dependence, cigarettes, uncomplicated: Secondary | ICD-10-CM | POA: Insufficient documentation

## 2023-01-29 DIAGNOSIS — J449 Chronic obstructive pulmonary disease, unspecified: Secondary | ICD-10-CM | POA: Insufficient documentation

## 2023-01-29 DIAGNOSIS — K449 Diaphragmatic hernia without obstruction or gangrene: Secondary | ICD-10-CM | POA: Diagnosis not present

## 2023-01-29 DIAGNOSIS — D120 Benign neoplasm of cecum: Secondary | ICD-10-CM

## 2023-01-29 DIAGNOSIS — K219 Gastro-esophageal reflux disease without esophagitis: Secondary | ICD-10-CM | POA: Insufficient documentation

## 2023-01-29 DIAGNOSIS — K635 Polyp of colon: Secondary | ICD-10-CM | POA: Diagnosis not present

## 2023-01-29 DIAGNOSIS — I2510 Atherosclerotic heart disease of native coronary artery without angina pectoris: Secondary | ICD-10-CM | POA: Insufficient documentation

## 2023-01-29 DIAGNOSIS — K648 Other hemorrhoids: Secondary | ICD-10-CM

## 2023-01-29 DIAGNOSIS — D125 Benign neoplasm of sigmoid colon: Secondary | ICD-10-CM | POA: Diagnosis not present

## 2023-01-29 DIAGNOSIS — Z1211 Encounter for screening for malignant neoplasm of colon: Secondary | ICD-10-CM

## 2023-01-29 DIAGNOSIS — Z86010 Personal history of colonic polyps: Secondary | ICD-10-CM

## 2023-01-29 DIAGNOSIS — Z79899 Other long term (current) drug therapy: Secondary | ICD-10-CM | POA: Insufficient documentation

## 2023-01-29 DIAGNOSIS — D123 Benign neoplasm of transverse colon: Secondary | ICD-10-CM | POA: Diagnosis not present

## 2023-01-29 DIAGNOSIS — I10 Essential (primary) hypertension: Secondary | ICD-10-CM | POA: Diagnosis not present

## 2023-01-29 DIAGNOSIS — K5730 Diverticulosis of large intestine without perforation or abscess without bleeding: Secondary | ICD-10-CM | POA: Diagnosis not present

## 2023-01-29 DIAGNOSIS — D126 Benign neoplasm of colon, unspecified: Secondary | ICD-10-CM

## 2023-01-29 HISTORY — PX: COLONOSCOPY WITH PROPOFOL: SHX5780

## 2023-01-29 HISTORY — PX: POLYPECTOMY: SHX5525

## 2023-01-29 SURGERY — COLONOSCOPY WITH PROPOFOL
Anesthesia: General

## 2023-01-29 MED ORDER — LACTATED RINGERS IV SOLN: INTRAVENOUS | Status: DC

## 2023-01-29 MED ORDER — LIDOCAINE HCL 1 % IJ SOLN
INTRAMUSCULAR | Status: DC | PRN
  Administered 2023-01-29: 50 mg via INTRADERMAL

## 2023-01-29 MED ORDER — PROPOFOL 10 MG/ML IV BOLUS
INTRAVENOUS | Status: DC | PRN
  Administered 2023-01-29 (×2): 25 mg via INTRAVENOUS
  Administered 2023-01-29: 50 mg via INTRAVENOUS
  Administered 2023-01-29: 100 mg via INTRAVENOUS

## 2023-01-29 MED ORDER — PROPOFOL 500 MG/50ML IV EMUL
INTRAVENOUS | Status: DC | PRN
  Administered 2023-01-29: 125 ug/kg/min via INTRAVENOUS

## 2023-01-29 NOTE — Discharge Instructions (Addendum)
  Colonoscopy Discharge Instructions  Read the instructions outlined below and refer to this sheet in the next few weeks. These discharge instructions provide you with general information on caring for yourself after you leave the hospital. Your doctor may also give you specific instructions. While your treatment has been planned according to the most current medical practices available, unavoidable complications occasionally occur.   ACTIVITY You may resume your regular activity, but move at a slower pace for the next 24 hours.  Take frequent rest periods for the next 24 hours.  Walking will help get rid of the air and reduce the bloated feeling in your belly (abdomen).  No driving for 24 hours (because of the medicine (anesthesia) used during the test).   Do not sign any important legal documents or operate any machinery for 24 hours (because of the anesthesia used during the test).  NUTRITION Drink plenty of fluids.  You may resume your normal diet as instructed by your doctor.  Begin with a light meal and progress to your normal diet. Heavy or fried foods are harder to digest and may make you feel sick to your stomach (nauseated).  Avoid alcoholic beverages for 24 hours or as instructed.  MEDICATIONS You may resume your normal medications unless your doctor tells you otherwise.  WHAT YOU CAN EXPECT TODAY Some feelings of bloating in the abdomen.  Passage of more gas than usual.  Spotting of blood in your stool or on the toilet paper.  IF YOU HAD POLYPS REMOVED DURING THE COLONOSCOPY: No aspirin products for 7 days or as instructed.  No alcohol for 7 days or as instructed.  Eat a soft diet for the next 24 hours.  FINDING OUT THE RESULTS OF YOUR TEST Not all test results are available during your visit. If your test results are not back during the visit, make an appointment with your caregiver to find out the results. Do not assume everything is normal if you have not heard from your  caregiver or the medical facility. It is important for you to follow up on all of your test results.  SEEK IMMEDIATE MEDICAL ATTENTION IF: You have more than a spotting of blood in your stool.  Your belly is swollen (abdominal distention).  You are nauseated or vomiting.  You have a temperature over 101.  You have abdominal pain or discomfort that is severe or gets worse throughout the day.   Your colonoscopy revealed 3 polyp(s) which I removed successfully. Await pathology results, my office will contact you. I do not think we need to perform further colonoscopy for polyp surveillance given your age  You also have diverticulosis and internal hemorrhoids. I would recommend increasing fiber in your diet or adding OTC Benefiber/Metamucil. Be sure to drink at least 4 to 6 glasses of water daily. Follow-up with GI in 3 months.   I hope you have a great rest of your week!  Hennie Duos. Marletta Lor, D.O. Gastroenterology and Hepatology [facility]

## 2023-01-29 NOTE — Transfer of Care (Signed)
Immediate Anesthesia Transfer of Care Note  Patient: [name]  Procedure(s) Performed: COLONOSCOPY WITH PROPOFOL POLYPECTOMY  Patient Location: Endoscopy Unit  Anesthesia Type:General  Level of Consciousness: awake  Airway & Oxygen Therapy: Patient Spontanous Breathing  Post-op Assessment: Report given to RN  Post vital signs: Reviewed  Last Vitals:  Vitals Value Taken Time  BP    Temp    Pulse 72 01/29/23 1408  Resp 16 01/29/23 1408  SpO2 100 % 01/29/23 1408    Last Pain:  Vitals:   01/29/23 1408  TempSrc:   PainSc: 0-No pain      Patients Stated Pain Goal: 7 (01/29/23 1152)  Complications: No notable events documented.

## 2023-01-29 NOTE — Anesthesia Preprocedure Evaluation (Addendum)
Anesthesia Evaluation  Patient identified by MRN, date of birth, ID band Patient awake    Reviewed: Allergy & Precautions, H&P , NPO status , Patient's Chart, lab work & pertinent test results  Airway Mallampati: II  TM Distance: >3 FB Neck ROM: Full    Dental  (+) Dental Advisory Given, Partial Upper, Missing   Pulmonary COPD, Current Smoker and Patient abstained from smoking.   Pulmonary exam normal breath sounds clear to auscultation       Cardiovascular hypertension, Pt. on medications + CAD  Normal cardiovascular exam Rhythm:Regular Rate:Normal     Neuro/Psych  Neuromuscular disease  negative psych ROS   GI/Hepatic Neg liver ROS, hiatal hernia, PUD,GERD  Medicated and Controlled,,  Endo/Other  negative endocrine ROS    Renal/GU Renal disease  negative genitourinary   Musculoskeletal  (+) Arthritis , Osteoarthritis,    Abdominal   Peds negative pediatric ROS (+)  Hematology negative hematology ROS (+)   Anesthesia Other Findings   Reproductive/Obstetrics negative OB ROS                             Anesthesia Physical Anesthesia Plan  ASA: 2  Anesthesia Plan: General   Post-op Pain Management: Minimal or no pain anticipated   Induction: Intravenous  PONV Risk Score and Plan: 1 and Propofol infusion  Airway Management Planned: Nasal Cannula and Natural Airway  Additional Equipment:   Intra-op Plan:   Post-operative Plan:   Informed Consent: I have reviewed the patients History and Physical, chart, labs and discussed the procedure including the risks, benefits and alternatives for the proposed anesthesia with the patient or authorized representative who has indicated his/her understanding and acceptance.     Dental advisory given  Plan Discussed with: CRNA and Surgeon  Anesthesia Plan Comments:        Anesthesia Quick Evaluation

## 2023-01-29 NOTE — Anesthesia Postprocedure Evaluation (Signed)
Anesthesia Post Note  Patient: [name]  Procedure(s) Performed: COLONOSCOPY WITH PROPOFOL POLYPECTOMY  Patient location during evaluation: Endoscopy Anesthesia Type: General Level of consciousness: awake and alert Pain management: pain level controlled Vital Signs Assessment: post-procedure vital signs reviewed and stable Respiratory status: spontaneous breathing Cardiovascular status: blood pressure returned to baseline and stable Postop Assessment: no apparent nausea or vomiting Anesthetic complications: no   No notable events documented.   Last Vitals:  Vitals:   01/29/23 1152 01/29/23 1408  BP: (!) 175/94 (!) 120/50  Pulse: 79 72  Resp: 16 16  Temp: 36.4 C   SpO2: 96% 100%    Last Pain:  Vitals:   01/29/23 1408  TempSrc:   PainSc: 0-No pain                 Laquan Beier

## 2023-01-29 NOTE — H&P (Signed)
Primary Care Physician:  Kerri Perches, MD Primary Gastroenterologist:  Dr. Marletta Lor  Pre-Procedure History & Physical: HPI:  [name] is a 82 y.o. female is here for a colonoscopy for colon cancer screening purposes.  Patient denies any family history of colorectal cancer.  No melena or hematochezia.  No abdominal pain or unintentional weight loss.  No change in bowel habits.  Overall feels well from a GI standpoint.  Past Medical History:  Diagnosis Date   COPD (chronic obstructive pulmonary disease) (HCC)    Hiatal hernia without gangrene and obstruction 03/23/2015   Hypertension 2004   Lung nodule seen on imaging study    Nicotine addiction    OA (osteoarthritis) of knee    Obesity    Sinusitis     Past Surgical History:  Procedure Laterality Date   bilateral cataract surgery  7/27 & 03/01/09   Dr. Nile Riggs     cholecystectomy     COLONOSCOPY  04/2004   Dr. Jerolyn Shin Smith-->melanosis coli   COLONOSCOPY WITH ESOPHAGOGASTRODUODENOSCOPY (EGD) N/A 02/25/2013   Dr. Fields:Normal mucosa in the terminal ileum/Severe melanosis throughout the entire examined colon/ONE COLON POLYP REMOVED (tubular adenoma)/Mild diverticulosis  in the sigmoid colon/EGD:The mucosa of the esophagus appeared normal/Non-erosive gastritis, empiric dilation with Savary dilator   ESOPHAGOGASTRODUODENOSCOPY  11/07/2010   Jenkins:hiatal hernia/no evidence of Barrett esophagitis.  The Z-line was noted to be at 39 cm from the teeth. CLO test negative   ESOPHAGOGASTRODUODENOSCOPY N/A 09/11/2013   Dr.Fields-  incomplete esophageal web in mid-esophagus, medium sized hialtal hernia, PUD. bx=focal erosion with inflammation and fibrosis   ESOPHAGOGASTRODUODENOSCOPY N/A 07/06/2014   healed ulcers, persistent erosions on aspirin, small hiatal hernia   ESOPHAGOGASTRODUODENOSCOPY N/A 02/05/2020   normal esophagus s/p dilation, normal stomach, normal duodenal bulb.    knee arthroscopy right     LEFT HEART CATHETERIZATION WITH  CORONARY ANGIOGRAM N/A 04/08/2014   Procedure: LEFT HEART CATHETERIZATION WITH CORONARY ANGIOGRAM;  Surgeon: Lesleigh Noe, MD;  Location: [facility] CATH LAB;  Service: Cardiovascular;  Laterality: N/A;   MALONEY DILATION N/A 02/25/2013   Procedure: Elease Hashimoto DILATION;  Surgeon: West Bali, MD;  Location: [facility] ENDO SUITE;  Service: Endoscopy;  Laterality: N/A;   PARTIAL HYSTERECTOMY     SAVORY DILATION N/A 02/25/2013   Procedure: SAVORY DILATION;  Surgeon: West Bali, MD;  Location: [facility] ENDO SUITE;  Service: Endoscopy;  Laterality: N/A;   total knee arthroplasty right  05/29/2005   Dr. Romeo Apple     Prior to Admission medications   Medication Sig Start Date End Date Taking? Authorizing Provider  amLODipine (NORVASC) 5 MG tablet TAKE 1 TABLET(5 MG) BY MOUTH DAILY 01/16/22  Yes Kerri Perches, MD  aspirin EC 81 MG tablet Take 81 mg by mouth daily. Swallow whole.   Yes [provider]  cetirizine (ZYRTEC) 5 MG tablet Take one tablet by mouth two times daily as needed , for uncontrolled allergy symptoms 09/06/22  Yes Kerri Perches, MD  ezetimibe (ZETIA) 10 MG tablet Take 1 tablet (10 mg total) by mouth daily. 08/31/22 08/26/23 Yes Hammock, Lavonna Rua, NP  fluticasone (FLONASE) 50 MCG/ACT nasal spray Place 2 sprays into both nostrils daily as needed for allergies.   Yes [provider]  magnesium hydroxide (MILK OF MAGNESIA) 400 MG/5ML suspension Take 30 mLs by mouth daily as needed for mild constipation or moderate constipation.   Yes [provider]  Multiple Vitamin (MULTIVITAMIN) tablet Take 1 tablet by mouth daily.   Yes  [provider]  olmesartan (BENICAR) 20 MG tablet TAKE 1 TABLET BY MOUTH EVERY NIGHT AT 10 PM FOR BLOOD PRESSURE 01/07/23  Yes Kerri Perches, MD  oxymetazoline (VICKS SINEX 12 HOUR DECONGEST) 0.05 % nasal spray Place 1 spray into both nostrils 2 (two) times daily as needed for congestion.   Yes [provider]  pantoprazole (PROTONIX)  40 MG tablet Take 40 mg by mouth daily. 08/12/22  Yes [provider]  potassium chloride (KLOR-CON) 10 MEQ tablet Take 20 mEq by mouth daily.   Yes [provider]  rosuvastatin (CRESTOR) 40 MG tablet Take 40 mg by mouth daily.   Yes [provider]  triamterene-hydrochlorothiazide (MAXZIDE) 75-50 MG tablet TAKE 1/2 TABLET BY MOUTH IN THE MORNING AT 10:00 AM AND 1/2 TABLET EVERY EVENING AT 10:00 PM 05/28/22  Yes Kerri Perches, MD  lubiprostone (AMITIZA) 8 MCG capsule Take 1 capsule (8 mcg total) by mouth 2 (two) times daily with a meal. Patient not taking: Reported on 01/23/2023 12/25/22   Aida Raider, NP    Allergies as of 12/25/2022   (No Known Allergies)    Family History  Problem Relation Age of Onset   Diabetes Mother    Hypertension Mother    Coronary artery disease Mother    Pneumonia Father    Hypertension Sister    Hypertension Sister    Hypertension Brother        Visual merchandiser   Diabetes Brother    Hypertension Brother    Colon cancer Neg Hx    Liver disease Neg Hx     Social History   Socioeconomic History   Marital status: Widowed    Spouse name: Not on file   Number of children: 4   Years of education: Not on file   Highest education level: Not on file  Occupational History   Occupation: employed     Employer: RETIRED  Tobacco Use   Smoking status: Some Days    Packs/day: 0.10    Years: 10.00    Additional pack years: 0.00    Total pack years: 1.00    Types: Cigarettes    Start date: 07/23/1962   Smokeless tobacco: Never   Tobacco comments:    1 per day   Vaping Use   Vaping Use: Never used  Substance and Sexual Activity   Alcohol use: No    Alcohol/week: 0.0 standard drinks of alcohol   Drug use: No   Sexual activity: Not Currently  Other Topics Concern   Not on file  Social History Narrative   Not on file   Social Determinants of Health   Financial Resource Strain: Low Risk  (03/28/2022)   Overall Financial  Resource Strain (CARDIA)    Difficulty of Paying Living Expenses: Not hard at all  Food Insecurity: No Food Insecurity (03/28/2022)   Hunger Vital Sign    Worried About Running Out of Food in the Last Year: Never true    Ran Out of Food in the Last Year: Never true  Transportation Needs: No Transportation Needs (03/28/2022)   PRAPARE - Administrator, Civil Service (Medical): No    Lack of Transportation (Non-Medical): No  Physical Activity: Insufficiently Active (03/07/2021)   Exercise Vital Sign    Days of Exercise per Week: 3 days    Minutes of Exercise per Session: 30 min  Stress: No Stress Concern Present (03/07/2021)   [employer] of Occupational Health - Occupational Stress Questionnaire  Feeling of Stress : Not at all  Social Connections: Moderately Isolated (03/28/2022)   Social Connection and Isolation Panel [NHANES]    Frequency of Communication with Friends and Family: Three times a week    Frequency of Social Gatherings with Friends and Family: Three times a week    Attends Religious Services: More than 4 times per year    Active Member of Clubs or Organizations: No    Attends Banker Meetings: Never    Marital Status: Widowed  Intimate Partner Violence: Not At Risk (03/07/2021)   Humiliation, Afraid, Rape, and Kick questionnaire    Fear of Current or Ex-Partner: No    Emotionally Abused: No    Physically Abused: No    Sexually Abused: No    Review of Systems: See HPI, otherwise negative ROS  Physical Exam: Vital signs in last 24 hours: Temp:  [97.6 F (36.4 C)] 97.6 F (36.4 C) (07/09 1152) Pulse Rate:  [79] 79 (07/09 1152) Resp:  [16] 16 (07/09 1152) BP: (175)/(94) 175/94 (07/09 1152) SpO2:  [96 %] 96 % (07/09 1152) Weight:  [79.8 kg] 79.8 kg (07/09 1152)   General:   Alert,  Well-developed, well-nourished, pleasant and cooperative in NAD Head:  Normocephalic and atraumatic. Eyes:  Sclera clear, no icterus.   Conjunctiva  pink. Ears:  Normal auditory acuity. Nose:  No deformity, discharge,  or lesions. Msk:  Symmetrical without gross deformities. Normal posture. Extremities:  Without clubbing or edema. Neurologic:  Alert and  oriented x4;  grossly normal neurologically. Skin:  Intact without significant lesions or rashes. Psych:  Alert and cooperative. Normal mood and affect.  Impression/Plan: [name] is here for a colonoscopy to be performed for colon cancer screening purposes.  The risks of the procedure including infection, bleed, or perforation as well as benefits, limitations, alternatives and imponderables have been reviewed with the patient. Questions have been answered. All parties agreeable.

## 2023-01-29 NOTE — Op Note (Signed)
[facility] Patient Name: [name] Procedure Date: 01/29/2023 1:39 PM MRN: [id number] Date of Birth: 1941-06-01 Attending MD: Hennie Duos. Marletta Lor , [place], [phone] CSN: [id number] Age: 82 Admit Type: Outpatient Procedure:                Colonoscopy Indications:              Screening for colorectal malignant neoplasm Providers:                Hennie Duos. Marletta Lor, DO, Sheran Fava, Dyann Ruddle Referring MD:              Medicines:                See the Anesthesia note for documentation of the                            administered medications Complications:            No immediate complications. Estimated Blood Loss:     Estimated blood loss was minimal. Procedure:                Pre-Anesthesia Assessment:                           - The anesthesia plan was to use monitored                            anesthesia care (MAC).                           After obtaining informed consent, the colonoscope                            was passed under direct vision. Throughout the                            procedure, the patient's blood pressure, pulse, and                            oxygen saturations were monitored continuously. The                            PCF-[id number] ([id number]) was introduced through the                            anus and advanced to the the cecum, identified by                            appendiceal orifice and ileocecal valve. The                            colonoscopy was performed without difficulty. The                            patient tolerated the procedure well. The quality  of the bowel preparation was evaluated using the                            BBPS [facility] Bowel Preparation Scale) with scores                            of: Right Colon = 3, Transverse Colon = 3 and Left                            Colon = 3 (entire mucosa seen well with no residual                            staining, small  fragments of stool or opaque                            liquid). The total BBPS score equals 9. Scope In: 1:49:06 PM Scope Out: 2:04:13 PM Scope Withdrawal Time: 0 hours 11 minutes 8 seconds  Total Procedure Duration: 0 hours 15 minutes 7 seconds  Findings:      Non-bleeding internal hemorrhoids were found during endoscopy.      Multiple medium-mouthed and small-mouthed diverticula were found in the       sigmoid colon.      A 4 mm polyp was found in the cecum. The polyp was sessile. The polyp       was removed with a cold snare. Resection and retrieval were complete.      Two sessile polyps were found in the sigmoid colon and transverse colon.       The polyps were 3 to 6 mm in size. These polyps were removed with a cold       snare. Resection and retrieval were complete.      The exam was otherwise without abnormality. Impression:               - Non-bleeding internal hemorrhoids.                           - Diverticulosis in the sigmoid colon.                           - One 4 mm polyp in the cecum, removed with a cold                            snare. Resected and retrieved.                           - Two 3 to 6 mm polyps in the sigmoid colon and in                            the transverse colon, removed with a cold snare.                            Resected and retrieved.                           - The  examination was otherwise normal. Moderate Sedation:      Per Anesthesia Care Recommendation:           - Patient has a contact number available for                            emergencies. The signs and symptoms of potential                            delayed complications were discussed with the                            patient. Return to normal activities tomorrow.                            Written discharge instructions were provided to the                            patient.                           - Resume previous diet.                           - Continue present  medications.                           - Await pathology results.                           - No repeat colonoscopy due to age.                           - Return to GI clinic in 3 months. Procedure Code(s):        --- Professional ---                           [phone], Colonoscopy, flexible; with removal of                            tumor(s), polyp(s), or other lesion(s) by snare                            technique Diagnosis Code(s):        --- Professional ---                           Z12.11, Encounter for screening for malignant                            neoplasm of colon                           K64.8, Other hemorrhoids                           D12.0, Benign neoplasm of cecum  D12.5, Benign neoplasm of sigmoid colon                           D12.3, Benign neoplasm of transverse colon (hepatic                            flexure or splenic flexure)                           K57.30, Diverticulosis of large intestine without                            perforation or abscess without bleeding CPT copyright 2022 American Medical Association. All rights reserved. The codes documented in this report are preliminary and upon coder review may  be revised to meet current compliance requirements. Hennie Duos. Marletta Lor, DO Hennie Duos. Marletta Lor, DO 01/29/2023 2:07:13 PM This report has been signed electronically. Number of Addenda: 0

## 2023-01-31 LAB — SURGICAL PATHOLOGY

## 2023-02-05 ENCOUNTER — Encounter (HOSPITAL_COMMUNITY): Payer: Self-pay | Admitting: Internal Medicine

## 2023-02-06 ENCOUNTER — Ambulatory Visit (INDEPENDENT_AMBULATORY_CARE_PROVIDER_SITE_OTHER): Payer: Medicare Other | Admitting: Family Medicine

## 2023-02-06 ENCOUNTER — Encounter: Payer: Self-pay | Admitting: Family Medicine

## 2023-02-06 VITALS — BP 154/82 | HR 97 | Ht 65.0 in | Wt 174.1 lb

## 2023-02-06 DIAGNOSIS — I10 Essential (primary) hypertension: Secondary | ICD-10-CM

## 2023-02-06 DIAGNOSIS — Z1231 Encounter for screening mammogram for malignant neoplasm of breast: Secondary | ICD-10-CM | POA: Diagnosis not present

## 2023-02-06 DIAGNOSIS — Z0001 Encounter for general adult medical examination with abnormal findings: Secondary | ICD-10-CM

## 2023-02-06 MED ORDER — CARVEDILOL 6.25 MG PO TABS: ORAL_TABLET | ORAL | 3 refills | Status: DC

## 2023-02-06 MED ORDER — CARVEDILOL 3.125 MG PO TABS
3.1250 mg | ORAL_TABLET | Freq: Two times a day (BID) | ORAL | 3 refills | Status: DC
Start: 2023-02-06 — End: 2023-11-22

## 2023-02-06 MED ORDER — POTASSIUM CHLORIDE CRYS ER 20 MEQ PO TBCR: 20.0000 meq | EXTENDED_RELEASE_TABLET | Freq: Every day | ORAL | 4 refills | Status: DC

## 2023-02-06 MED ORDER — OLMESARTAN MEDOXOMIL 20 MG PO TABS: ORAL_TABLET | ORAL | 4 refills | Status: DC

## 2023-02-06 NOTE — Patient Instructions (Addendum)
Pls schedule wellness due in Sept ar checkout  F/U in 4 to 5 weeks, bring medication and BP cuff please, re evaluate blood pressure  Please schedule mammogram due in September  at checkout   CHANGE in blood pressure medications only Stop triamterene for blood pressure, new in its place is carvedilol 3.125 mg take one tablet at 10 am and the 2nd tablet at 10 pm Continue amlodipine and olmesartan as before ( nurse pls call and directly speak with the pharmacist also, to stop triamterene, and I had initially sent in carvedilol 6.25 mg  tablet , I do not want that , I want the 3.125 mg dose whicch I sent in afterwards, thanks)  Non fasting chem 7 and EGFR 3 to 5 days before follow up  Need RSV vaccine and TdAP , both are at your pharmacy  Nurse pls enter shingrix vaccines states she has had both  Thanks for choosing [facility], we consider it a privelige to serve you.

## 2023-02-12 ENCOUNTER — Encounter: Payer: Self-pay | Admitting: Family Medicine

## 2023-02-12 DIAGNOSIS — Z0001 Encounter for general adult medical examination with abnormal findings: Secondary | ICD-10-CM | POA: Insufficient documentation

## 2023-02-12 NOTE — Assessment & Plan Note (Signed)

## 2023-02-12 NOTE — Assessment & Plan Note (Signed)
Med change from triamternee to see effect on CKD, start coreg 3.125 mg twice daily DASH diet and commitment to daily physical activity for a minimum of 30 minutes discussed and encouraged, as a part of hypertension management. The importance of attaining a healthy weight is also discussed.     02/06/2023    1:06 PM 02/06/2023    1:04 PM 01/29/2023    2:08 PM 01/29/2023   11:52 AM 12/25/2022   10:37 AM 12/25/2022   10:32 AM 09/06/2022    1:12 PM  BP/Weight  Systolic BP 154 183 120 175 166 189 142  Diastolic BP 82 76 50 94 89 90 82  Wt. (Lbs)  174.08  176  177.4   BMI  28.97 kg/m2  28.41 kg/m2  29.52 kg/m2      F/u in 4 weeks with renal functuion re checked

## 2023-02-12 NOTE — Progress Notes (Signed)
    IRIDIAN READER     MRN: [id number]      DOB: August 09, 1940  Chief Complaint  Patient presents with   Annual Exam    cpe    HPI: Patient is in for annual physical exam. Uncontrolled blood pressure is also addressed Recent labs,  are reviewed. Immunization is reviewed , and  updated if needed.   PE: BP (!) 154/82 (BP Location: Left Arm, Patient Position: Sitting, Cuff Size: Normal)   Pulse 97   Ht 5\' 5"  (1.651 m)   Wt 174 lb 1.3 oz (79 kg)   SpO2 92%   BMI 28.97 kg/m   Pleasant  female, alert and oriented x 3, in no cardio-pulmonary distress. Afebrile. HEENT No facial trauma or asymetry. Sinuses non tender.  Extra occullar muscles intact.. External ears normal, . Neck: supple, no adenopathy,JVD or thyromegaly.No bruits.  Chest: Clear decreased air entry throughout, scattered crackles , no wheezes Non tender to palpation   Cardiovascular system; Heart sounds normal,  S1 and  S2 ,no S3.  No murmur, or thrill. Peripheral pulses normal.  Abdomen: Soft, non tender, no organomegaly No guarding, tenderness or rebound.   .   Musculoskeletal exam: Decreased ROM of spine, hips , shoulders and knees.  deformity ,swelling oand crepitus noted. No muscle wasting or atrophy.   Neurologic: Cranial nerves 2 to 12 intact. Power, tone ,sensation normal throughout. Mild  disturbance in gait. No tremor.  Skin: Intact, no ulceration, erythema , scaling or rash noted. Pigmentation normal throughout  Psych; Normal mood and affect. Judgement and concentration normal   Assessment & Plan:  Encounter for Medicare annual examination with abnormal findings Annual exam as documented. Counseling done  re healthy lifestyle involving commitment to 150 minutes exercise per week, heart healthy diet, and attaining healthy weight.The importance of adequate sleep also discussed. Regular seat belt use and home safety, is also discussed. Changes in health habits are decided on by the  patient with goals and time frames  set for achieving them. Immunization and cancer screening needs are specifically addressed at this visit.   NICOTINE ADDICTION Asked:confirms currently smokes cigarettes Assess: Unwilling to set a quit date, but is cutting back Advise: needs to QUIT to reduce risk of cancer, cardio and cerebrovascular disease Assist: counseled for 5 minutes and literature provided Arrange: follow up in 2 to 4 months   Essential hypertension Med change from triamternee to see effect on CKD, start coreg 3.125 mg twice daily DASH diet and commitment to daily physical activity for a minimum of 30 minutes discussed and encouraged, as a part of hypertension management. The importance of attaining a healthy weight is also discussed.     02/06/2023    1:06 PM 02/06/2023    1:04 PM 01/29/2023    2:08 PM 01/29/2023   11:52 AM 12/25/2022   10:37 AM 12/25/2022   10:32 AM 09/06/2022    1:12 PM  BP/Weight  Systolic BP 154 183 120 175 166 189 142  Diastolic BP 82 76 50 94 89 90 82  Wt. (Lbs)  174.08  176  177.4   BMI  28.97 kg/m2  28.41 kg/m2  29.52 kg/m2      F/u in 4 weeks with renal functuion re checked

## 2023-02-12 NOTE — Assessment & Plan Note (Signed)
Asked:confirms currently smokes cigarettes °Assess: Unwilling to set a quit date, but is cutting back °Advise: needs to QUIT to reduce risk of cancer, cardio and cerebrovascular disease °Assist: counseled for 5 minutes and literature provided °Arrange: follow up in 2 to 4 months ° °

## 2023-03-11 DIAGNOSIS — I10 Essential (primary) hypertension: Secondary | ICD-10-CM | POA: Diagnosis not present

## 2023-03-12 ENCOUNTER — Other Ambulatory Visit: Payer: Self-pay | Admitting: Internal Medicine

## 2023-03-12 LAB — BMP8+EGFR
BUN: 13 mg/dL (ref 8–27)
Chloride: 104 mmol/L (ref 96–106)
Potassium: 4.3 mmol/L (ref 3.5–5.2)

## 2023-03-13 ENCOUNTER — Encounter: Payer: Self-pay | Admitting: Family Medicine

## 2023-03-13 ENCOUNTER — Ambulatory Visit (INDEPENDENT_AMBULATORY_CARE_PROVIDER_SITE_OTHER): Payer: Medicare Other | Admitting: Family Medicine

## 2023-03-13 ENCOUNTER — Other Ambulatory Visit: Payer: Self-pay

## 2023-03-13 VITALS — BP 120/80 | HR 98 | Ht 65.0 in | Wt 174.1 lb

## 2023-03-13 DIAGNOSIS — J302 Other seasonal allergic rhinitis: Secondary | ICD-10-CM | POA: Diagnosis not present

## 2023-03-13 DIAGNOSIS — E876 Hypokalemia: Secondary | ICD-10-CM

## 2023-03-13 DIAGNOSIS — K219 Gastro-esophageal reflux disease without esophagitis: Secondary | ICD-10-CM

## 2023-03-13 DIAGNOSIS — F17210 Nicotine dependence, cigarettes, uncomplicated: Secondary | ICD-10-CM | POA: Diagnosis not present

## 2023-03-13 DIAGNOSIS — E785 Hyperlipidemia, unspecified: Secondary | ICD-10-CM

## 2023-03-13 DIAGNOSIS — R7303 Prediabetes: Secondary | ICD-10-CM | POA: Diagnosis not present

## 2023-03-13 DIAGNOSIS — F17200 Nicotine dependence, unspecified, uncomplicated: Secondary | ICD-10-CM | POA: Diagnosis not present

## 2023-03-13 DIAGNOSIS — E663 Overweight: Secondary | ICD-10-CM

## 2023-03-13 DIAGNOSIS — I10 Essential (primary) hypertension: Secondary | ICD-10-CM | POA: Diagnosis not present

## 2023-03-13 MED ORDER — BENZONATATE 100 MG PO CAPS: 100.0000 mg | ORAL_CAPSULE | Freq: Two times a day (BID) | ORAL | 0 refills | Status: DC | PRN

## 2023-03-13 NOTE — Patient Instructions (Addendum)
F/U  2nd or 3rd week in  January, call if you need me sooner  Excellent blood pressure, keep same medications  STOP potassium , should not be necessary  Tylenol 325 mg one to two per day as needed, for back pain is recommended   Nurse ps document shingrix vaccines, [facility] , [address] both given per pt  Fasting lipid, cmp and EGFr, HBA1C and magnesium level  first week in January  I recommend RSV, covid and flu vaccines, take separately  You also need TdAP  Best for 2025!  Thanks for choosing [facility], we consider it a privelige to serve you.

## 2023-03-16 ENCOUNTER — Encounter: Payer: Self-pay | Admitting: Family Medicine

## 2023-03-16 DIAGNOSIS — J309 Allergic rhinitis, unspecified: Secondary | ICD-10-CM | POA: Insufficient documentation

## 2023-03-16 NOTE — Assessment & Plan Note (Signed)
Tylenol 325 mg one to two/ day as needed for pain

## 2023-03-16 NOTE — Assessment & Plan Note (Signed)
Asked:confirms currently smokes cigarettes °Assess: Unwilling to set a quit date, but is cutting back °Advise: needs to QUIT to reduce risk of cancer, cardio and cerebrovascular disease °Assist: counseled for 5 minutes and literature provided °Arrange: follow up in 2 to 4 months ° °

## 2023-03-16 NOTE — Assessment & Plan Note (Signed)
Patient educated about the importance of limiting  Carbohydrate intake , the need to commit to daily physical activity for a minimum of 30 minutes , and to commit weight loss. The fact that changes in all these areas will reduce or eliminate all together the development of diabetes is stressed.  Improving     Latest Ref Rng & Units 03/11/2023   10:42 AM 01/15/2023   10:16 AM 01/03/2023   10:34 AM 04/17/2022   11:31 AM 09/12/2021   11:27 AM  Diabetic Labs  HbA1c 4.8 - 5.6 %   5.8  6.0    Chol 100 - 199 mg/dL   782  956  213   HDL >08 mg/dL   54  47  48   Calc LDL 0 - 99 mg/dL   73  98  73   Triglycerides 0 - 149 mg/dL   657  846  962   Creatinine 0.57 - 1.00 mg/dL 9.52  8.41  3.24  4.01  1.26       03/13/2023    1:32 PM 03/13/2023    1:18 PM 02/06/2023    1:06 PM 02/06/2023    1:04 PM 01/29/2023    2:08 PM 01/29/2023   11:52 AM 12/25/2022   10:37 AM  BP/Weight  Systolic BP 120 120 154 183 120 175 166  Diastolic BP 80 70 82 76 50 94 89  Wt. (Lbs)  174.08  174.08  176   BMI  28.97 kg/m2  28.97 kg/m2  28.41 kg/m2       05/02/2010   12:00 AM  Foot/eye exam completion dates  Foot exam Order yes         This result is from an external source.

## 2023-03-16 NOTE — Assessment & Plan Note (Signed)
Controlled, no change in medication  

## 2023-03-16 NOTE — Progress Notes (Signed)
[name]     MRN: [id number]      DOB: Mar 17, 1941  Chief Complaint  Patient presents with   Follow-up    Questions about medication for her back, sinus issues productive cough    HPI [name] is here for follow up and re-evaluation of chronic medical conditions, medication management and review of any available recent lab and radiology data.  Preventive health is updated, specifically  Cancer screening and Immunization.   Questions or concerns regarding consultations or procedures which the PT has had in the interim are  addressed.  Sinus and nasal congestion, clear , with scant cough, no soere throat , fever or chills x 2 weeks, no ear pain. ROS . Denies chest pains, palpitations and leg swelling Denies abdominal pain, nausea, vomiting,diarrhea or constipation.   Denies dysuria, frequency, hesitancy or incontinence. Chronic back pain and limitation in mobility.at times Denies headaches, seizures, numbness, or tingling. Denies depression, anxiety or insomnia. Denies skin break down or rash.   PE  BP 120/80   Pulse 98   Ht 5\' 5"  (1.651 m)   Wt 174 lb 1.3 oz (79 kg)   SpO2 93%   BMI 28.97 kg/m   Patient alert and oriented and in no cardiopulmonary distress.  HEENT: No facial asymmetry, EOMI,     Neck supple .  Chest: Clear to auscultation bilaterally.  CVS: S1, S2 no murmurs, no S3.Regular rate.  ABD: Soft non tender.   Ext: No edema  MS: Adequate ROM spine, shoulders, hips and knees.  Skin: Intact, no ulcerations or rash noted.  Psych: Good eye contact, normal affect. Memory intact not anxious or depressed appearing.  CNS: CN 2-12 intact, power,  normal throughout.no focal deficits noted.   Assessment & Plan  BACK PAIN WITH RADICULOPATHY Tylenol 325 mg one to two/ day as needed for pain  Essential hypertension Controlled, no change in medication DASH diet and commitment to daily physical activity for a minimum of 30 minutes discussed and  encouraged, as a part of hypertension management. The importance of attaining a healthy weight is also discussed.     03/13/2023    1:32 PM 03/13/2023    1:18 PM 02/06/2023    1:06 PM 02/06/2023    1:04 PM 01/29/2023    2:08 PM 01/29/2023   11:52 AM 12/25/2022   10:37 AM  BP/Weight  Systolic BP 120 120 154 183 120 175 166  Diastolic BP 80 70 82 76 50 94 89  Wt. (Lbs)  174.08  174.08  176   BMI  28.97 kg/m2  28.97 kg/m2  28.41 kg/m2        Prediabetes Patient educated about the importance of limiting  Carbohydrate intake , the need to commit to daily physical activity for a minimum of 30 minutes , and to commit weight loss. The fact that changes in all these areas will reduce or eliminate all together the development of diabetes is stressed.  Improving     Latest Ref Rng & Units 03/11/2023   10:42 AM 01/15/2023   10:16 AM 01/03/2023   10:34 AM 04/17/2022   11:31 AM 09/12/2021   11:27 AM  Diabetic Labs  HbA1c 4.8 - 5.6 %   5.8  6.0    Chol 100 - 199 mg/dL   782  956  213   HDL >08 mg/dL   54  47  48   Calc LDL 0 - 99 mg/dL   73  98  73  Triglycerides 0 - 149 mg/dL   161  096  045   Creatinine 0.57 - 1.00 mg/dL 4.09  8.11  9.14  7.82  1.26       03/13/2023    1:32 PM 03/13/2023    1:18 PM 02/06/2023    1:06 PM 02/06/2023    1:04 PM 01/29/2023    2:08 PM 01/29/2023   11:52 AM 12/25/2022   10:37 AM  BP/Weight  Systolic BP 120 120 154 183 120 175 166  Diastolic BP 80 70 82 76 50 94 89  Wt. (Lbs)  174.08  174.08  176   BMI  28.97 kg/m2  28.97 kg/m2  28.41 kg/m2       05/02/2010   12:00 AM  Foot/eye exam completion dates  Foot exam Order yes         This result is from an external source.      Overweight (BMI 25.0-29.9)  Patient re-educated about  the importance of commitment to a  minimum of 150 minutes of exercise per week as able.  The importance of healthy food choices with portion control discussed, as well as eating regularly and within a 12 hour window most days. The  need to choose "clean , green" food 50 to 75% of the time is discussed, as well as to make water the primary drink and set a goal of 64 ounces water daily.       03/13/2023    1:18 PM 02/06/2023    1:04 PM 01/29/2023   11:52 AM  Weight /BMI  Weight 174 lb 1.3 oz 174 lb 1.3 oz 176 lb  Height 5\' 5"  (1.651 m) 5\' 5"  (1.651 m) 5\' 6"  (1.676 m)  BMI 28.97 kg/m2 28.97 kg/m2 28.41 kg/m2    unchanged  GERD (gastroesophageal reflux disease) Controlled, no change in medication   NICOTINE ADDICTION Asked:confirms currently smokes cigarettes Assess: Unwilling to set a quit date, but is cutting back Advise: needs to QUIT to reduce risk of cancer, cardio and cerebrovascular disease Assist: counseled for 5 minutes and literature provided Arrange: follow up in 2 to 4 months   Dyslipidemia (high LDL; low HDL) Hyperlipidemia:Low fat diet discussed and encouraged.   Lipid Panel  Lab Results  Component Value Date   CHOL 146 01/03/2023   HDL 54 01/03/2023   LDLCALC 73 01/03/2023   TRIG 104 01/03/2023   CHOLHDL 2.7 01/03/2023     Controlled, no change in medication   Seasonal allergies Increased and uncontrolled symptoms currently Flonase and zyrtec and tessalon perles added

## 2023-03-16 NOTE — Assessment & Plan Note (Signed)
Increased and uncontrolled symptoms currently Flonase and zyrtec and tessalon perles added

## 2023-03-16 NOTE — Assessment & Plan Note (Signed)
Hyperlipidemia:Low fat diet discussed and encouraged.   Lipid Panel  Lab Results  Component Value Date   CHOL 146 01/03/2023   HDL 54 01/03/2023   LDLCALC 73 01/03/2023   TRIG 104 01/03/2023   CHOLHDL 2.7 01/03/2023     Controlled, no change in medication

## 2023-03-16 NOTE — Assessment & Plan Note (Signed)
Controlled, no change in medication DASH diet and commitment to daily physical activity for a minimum of 30 minutes discussed and encouraged, as a part of hypertension management. The importance of attaining a healthy weight is also discussed.     03/13/2023    1:32 PM 03/13/2023    1:18 PM 02/06/2023    1:06 PM 02/06/2023    1:04 PM 01/29/2023    2:08 PM 01/29/2023   11:52 AM 12/25/2022   10:37 AM  BP/Weight  Systolic BP 120 120 154 183 120 175 166  Diastolic BP 80 70 82 76 50 94 89  Wt. (Lbs)  174.08  174.08  176   BMI  28.97 kg/m2  28.97 kg/m2  28.41 kg/m2

## 2023-03-16 NOTE — Assessment & Plan Note (Signed)
  Patient re-educated about  the importance of commitment to a  minimum of 150 minutes of exercise per week as able.  The importance of healthy food choices with portion control discussed, as well as eating regularly and within a 12 hour window most days. The need to choose "clean , green" food 50 to 75% of the time is discussed, as well as to make water the primary drink and set a goal of 64 ounces water daily.       03/13/2023    1:18 PM 02/06/2023    1:04 PM 01/29/2023   11:52 AM  Weight /BMI  Weight 174 lb 1.3 oz 174 lb 1.3 oz 176 lb  Height 5\' 5"  (1.651 m) 5\' 5"  (1.651 m) 5\' 6"  (1.676 m)  BMI 28.97 kg/m2 28.97 kg/m2 28.41 kg/m2    unchanged

## 2023-04-01 ENCOUNTER — Ambulatory Visit: Payer: Medicare Other | Admitting: Gastroenterology

## 2023-04-01 ENCOUNTER — Encounter: Payer: Self-pay | Admitting: Gastroenterology

## 2023-04-01 VITALS — BP 135/72 | HR 87 | Temp 97.9°F | Ht 65.0 in | Wt 174.8 lb

## 2023-04-01 DIAGNOSIS — K5900 Constipation, unspecified: Secondary | ICD-10-CM | POA: Diagnosis not present

## 2023-04-01 DIAGNOSIS — R140 Abdominal distension (gaseous): Secondary | ICD-10-CM

## 2023-04-01 DIAGNOSIS — K219 Gastro-esophageal reflux disease without esophagitis: Secondary | ICD-10-CM | POA: Diagnosis not present

## 2023-04-01 NOTE — Patient Instructions (Addendum)
Continue Gas-X as needed.  Since you are drinking a daily breakfast drink you can try taking some Lactaid to see if this helps with the bloating and burping.  Continue pantoprazole 40 mg once daily.  Continue your daily MiraLAX as well as your breakfast drink and banana as long as that is controlling your constipation.  I suspect your bloating is related to your constipation as well as increase in fiber in your diet.  If you begin to have any worsening swelling in your belly please let me know we can do an ultrasound.   We will plan to follow-up in 6 months, sooner if needed.  Please do not hesitate to reach out you have any questions or concerns.  It was a pleasure to see you today. I want to create trusting relationships with patients. If you receive a survey regarding your visit,  I greatly appreciate you taking time to fill this out on paper or through your MyChart. I value your feedback.  Brooke Bonito, MSN, FNP-BC, AGACNP-BC [facility]

## 2023-04-01 NOTE — Progress Notes (Signed)
GI Office Note    Referring Provider: Kerri Perches, MD Primary Care Physician:  Kerri Perches, MD Primary Gastroenterologist: Hennie Duos. Marletta Lor, DO  Date:  04/01/2023  ID:  [name], DOB June 06, 1941, MRN [id number]   Chief Complaint   Chief Complaint  Patient presents with   Abdominal Pain    Pressure at the top of stomach. Feel like everything stops at the top of stomach after she eats.    History of Present Illness  Laura SCHIMMOELLER is a 82 y.o. female with a history of chronic GERD, hiatal hernia, COPD, HTN, and osteoarthritis presenting today for annual follow-up of GERD.   Colonoscopy in 2014 with 1 small tubular adenoma removed from the sigmoid colon.  Recommended repeat in 10 years.  EGD July 2021: -Normal esophagus s/p dilation -Normal stomach -normal duodenum -Advised to stop pantoprazole and trial Nexium daily. -If weight loss continued then recommended CTA for evaluation of mesenteric ischemia.  Office visit 01/03/2022.  GERD controlled on pantoprazole once daily.  Does not need refills.  Also with chronic constipation.  Did note some intermittent abdominal bloating.  Previously given Linzess samples but she was unable to afford this.  Taking milk of magnesia as needed which controls her symptoms.  No melena or BRBPR.  She was advised to continue milk of magnesia as needed for constipation and PPI once daily for reflux.  Advised to discuss risk versus benefits of repeat colonoscopy at next visit.  Last office visit 12/25/22.  Reported when coughing she feels like something is pressing on her hernia site and then releases and goes down.  Had been coughing up phlegm at night and a yellowish color.  Does not take her allergy medicine regularly.  About a month prior she had taken antibiotics and prednisone for her cough and phlegm.  Reflux doing well on pantoprazole and avoiding triggers.  Noted a lot of gas moving from the mid to lower abdomen especially after  eating steamed cabbage, rice, and broccoli.  Had been taking Gas-X as needed.  Drinks ginger ale to help belch and to help with gas relief.  Denied any chest pain or shortness of breath.  Constipation is occasional.  Advised to proceed with colonoscopy, continue PPI.  Start Amitiza and will trial MiraLAX if no improvement with Amitiza.  Continue milk of mag and Gas-X as needed.  Amitiza was too expensive.  Provided some samples of Linzess.  Offered patient assistance for Amitiza.  Colonoscopy 01/29/2023: - Non- bleeding internal hemorrhoids.  - Diverticulosis in the sigmoid colon. - One 4 mm polyp in the cecum, removed with a cold snare. Resected and retrieved.  - Two 3 to 6 mm polyps in the sigmoid colon and in the transverse colon, removed with a cold snare. Resected and retrieved.  - The examination was otherwise normal. -Pathology revealed tubular adenoma and sessile serrated adenoma without dysplasia.  No repeat colonoscopy needed due to age.   Today: Constipation - Linzess has worked well for her. Has been tacking miralax with OJ and has found out that bananas and a breakfast drink and within 5-10 minutes she is able to go. That has been working well. Still having some bloating.   Still burps after ginger ale and has good relief of symptoms. Eats a lot of fruit, bakes her chicken. Rarely eats red meat.   GERD - Well controlled with her medication and avoidance of triggers.   If hemorrhoids start to bother her she uses a  hot towel and gets relief.   Rides a bike daily now, for about 30 minutes.   Current Outpatient Medications  Medication Sig Dispense Refill   amLODipine (NORVASC) 5 MG tablet TAKE 1 TABLET(5 MG) BY MOUTH DAILY 90 tablet 2   aspirin EC 81 MG tablet Take 81 mg by mouth daily. Swallow whole.     carvedilol (COREG) 3.125 MG tablet Take 1 tablet (3.125 mg total) by mouth 2 (two) times daily with a meal. 60 tablet 3   cetirizine (ZYRTEC) 5 MG tablet Take one tablet by mouth  two times daily as needed , for uncontrolled allergy symptoms 60 tablet 5   ezetimibe (ZETIA) 10 MG tablet Take 1 tablet (10 mg total) by mouth daily. 90 tablet 3   fluticasone (FLONASE) 50 MCG/ACT nasal spray Place 2 sprays into both nostrils daily as needed for allergies.     magnesium hydroxide (MILK OF MAGNESIA) 400 MG/5ML suspension Take 30 mLs by mouth daily as needed for mild constipation or moderate constipation.     Multiple Vitamin (MULTIVITAMIN) tablet Take 1 tablet by mouth daily.     olmesartan (BENICAR) 20 MG tablet TAKE 1 TABLET BY MOUTH EVERY NIGHT AT 10 PM FOR BLOOD PRESSURE 30 tablet 4   oxymetazoline (VICKS SINEX 12 HOUR DECONGEST) 0.05 % nasal spray Place 1 spray into both nostrils 2 (two) times daily as needed for congestion.     pantoprazole (PROTONIX) 40 MG tablet TAKE 1 TABLET(40 MG) BY MOUTH DAILY BEFORE BREAKFAST 90 tablet 1   rosuvastatin (CRESTOR) 40 MG tablet Take 40 mg by mouth daily.     lubiprostone (AMITIZA) 8 MCG capsule Take 1 capsule (8 mcg total) by mouth 2 (two) times daily with a meal. (Patient not taking: Reported on 01/23/2023) 60 capsule 2   No current facility-administered medications for this visit.    Past Medical History:  Diagnosis Date   COPD (chronic obstructive pulmonary disease) (HCC)    Hiatal hernia without gangrene and obstruction 03/23/2015   Hypertension 2004   Lung nodule seen on imaging study    Nicotine addiction    OA (osteoarthritis) of knee    Obesity    Sinusitis     Past Surgical History:  Procedure Laterality Date   bilateral cataract surgery  7/27 & 03/01/09   Dr. Nile Riggs     cholecystectomy     COLONOSCOPY  04/2004   Dr. Jerolyn Shin Smith-->melanosis coli   COLONOSCOPY WITH ESOPHAGOGASTRODUODENOSCOPY (EGD) N/A 02/25/2013   Dr. Fields:Normal mucosa in the terminal ileum/Severe melanosis throughout the entire examined colon/ONE COLON POLYP REMOVED (tubular adenoma)/Mild diverticulosis  in the sigmoid colon/EGD:The mucosa of the  esophagus appeared normal/Non-erosive gastritis, empiric dilation with Savary dilator   COLONOSCOPY WITH PROPOFOL N/A 01/29/2023   Procedure: COLONOSCOPY WITH PROPOFOL;  Surgeon: Lanelle Bal, DO;  Location: [facility];  Service: Endoscopy;  Laterality: N/A;  930am, asa 2   ESOPHAGOGASTRODUODENOSCOPY  11/07/2010   Jenkins:hiatal hernia/no evidence of Barrett esophagitis.  The Z-line was noted to be at 39 cm from the teeth. CLO test negative   ESOPHAGOGASTRODUODENOSCOPY N/A 09/11/2013   Dr.Fields-  incomplete esophageal web in mid-esophagus, medium sized hialtal hernia, PUD. bx=focal erosion with inflammation and fibrosis   ESOPHAGOGASTRODUODENOSCOPY N/A 07/06/2014   healed ulcers, persistent erosions on aspirin, small hiatal hernia   ESOPHAGOGASTRODUODENOSCOPY N/A 02/05/2020   normal esophagus s/p dilation, normal stomach, normal duodenal bulb.    knee arthroscopy right     LEFT HEART CATHETERIZATION WITH CORONARY ANGIOGRAM  N/A 04/08/2014   Procedure: LEFT HEART CATHETERIZATION WITH CORONARY ANGIOGRAM;  Surgeon: Lesleigh Noe, MD;  Location: [facility];  Service: Cardiovascular;  Laterality: N/A;   MALONEY DILATION N/A 02/25/2013   Procedure: Elease Hashimoto DILATION;  Surgeon: West Bali, MD;  Location: [facility];  Service: Endoscopy;  Laterality: N/A;   PARTIAL HYSTERECTOMY     POLYPECTOMY  01/29/2023   Procedure: POLYPECTOMY;  Surgeon: Lanelle Bal, DO;  Location: [facility];  Service: Endoscopy;;   SAVORY DILATION N/A 02/25/2013   Procedure: SAVORY DILATION;  Surgeon: West Bali, MD;  Location: [facility];  Service: Endoscopy;  Laterality: N/A;   total knee arthroplasty right  05/29/2005   Dr. Romeo Apple     Family History  Problem Relation Age of Onset   Diabetes Mother    Hypertension Mother    Coronary artery disease Mother    Pneumonia Father    Hypertension Sister    Hypertension Sister    Hypertension Brother        pace maker   Diabetes Brother     Hypertension Brother    Colon cancer Neg Hx    Liver disease Neg Hx     Allergies as of 04/01/2023   (No Known Allergies)    Social History   Socioeconomic History   Marital status: Widowed    Spouse name: Not on file   Number of children: 4   Years of education: Not on file   Highest education level: Not on file  Occupational History   Occupation: employed     Employer: RETIRED  Tobacco Use   Smoking status: Some Days    Current packs/day: 0.10    Average packs/day: 0.1 packs/day for 60.7 years (6.1 ttl pk-yrs)    Types: Cigarettes    Start date: 07/23/1962   Smokeless tobacco: Never   Tobacco comments:    1 per day   Vaping Use   Vaping status: Never Used  Substance and Sexual Activity   Alcohol use: No    Alcohol/week: 0.0 standard drinks of alcohol   Drug use: No   Sexual activity: Not Currently  Other Topics Concern   Not on file  Social History Narrative   Not on file   Social Determinants of Health   Financial Resource Strain: Low Risk  (03/28/2022)   Overall Financial Resource Strain (CARDIA)    Difficulty of Paying Living Expenses: Not hard at all  Food Insecurity: No Food Insecurity (03/28/2022)   Hunger Vital Sign    Worried About Running Out of Food in the Last Year: Never true    Ran Out of Food in the Last Year: Never true  Transportation Needs: No Transportation Needs (03/28/2022)   PRAPARE - Administrator, Civil Service (Medical): No    Lack of Transportation (Non-Medical): No  Physical Activity: Insufficiently Active (03/07/2021)   Exercise Vital Sign    Days of Exercise per Week: 3 days    Minutes of Exercise per Session: 30 min  Stress: No Stress Concern Present (03/07/2021)   [employer] of Occupational Health - Occupational Stress Questionnaire    Feeling of Stress : Not at all  Social Connections: Moderately Isolated (03/28/2022)   Social Connection and Isolation Panel [NHANES]    Frequency of Communication with Friends and  Family: Three times a week    Frequency of Social Gatherings with Friends and Family: Three times a week    Attends Religious Services:  More than 4 times per year    Active Member of Clubs or Organizations: No    Attends Banker Meetings: Never    Marital Status: Widowed     Review of Systems   Gen: Denies fever, chills, anorexia. Denies fatigue, weakness, weight loss.  CV: Denies chest pain, palpitations, syncope, peripheral edema, and claudication. Resp: Denies dyspnea at rest, cough, wheezing, coughing up blood, and pleurisy. GI: See HPI Derm: Denies rash, itching, dry skin Psych: Denies depression, anxiety, memory loss, confusion. No homicidal or suicidal ideation.  Heme: Denies bruising, bleeding, and enlarged lymph nodes.   Physical Exam   BP 135/72 (BP Location: Right Arm, Patient Position: Sitting, Cuff Size: Normal)   Pulse 87   Temp 97.9 F (36.6 C) (Temporal)   Ht 5\' 5"  (1.651 m)   Wt 174 lb 12.8 oz (79.3 kg)   SpO2 98%   BMI 29.09 kg/m   General:   Alert and oriented. No distress noted. Pleasant and cooperative.  Head:  Normocephalic and atraumatic. Eyes:  Conjuctiva clear without scleral icterus. Abdomen:  +BS, soft. Mild distention. Mild ttp to epigastrium. No rebound or guarding. No HSM or masses noted. Rectal: deferred Msk:  Symmetrical without gross deformities. Normal posture. Extremities:  Without edema. Neurologic:  Alert and  oriented x4 Psych:  Alert and cooperative. Normal mood and affect.  Assessment  [name] is a 82 y.o. female with a history of chronic GERD, hiatal hernia, COPD, HTN, and osteoarthritis presenting today for follow up.   Constipation, bloating: Amitiza was too expensive, Linzess samples worked well for her but reports this is not covered by her insurance either.  Since her last visit she has been taking MiraLAX daily and drinking a breakfast shake along with a banana daily and this is resulted in daily bowel  movements.  She continues to have some bloating and epigastric tenderness and a fullness feeling to her upper abdomen that sometimes worsens throughout the day.  This is likely related to dietary factors.  Last EGD in 2021 without any evidence of hernia and overall normal.  Advise that we could evaluate with an ultrasound if tenderness worsens or distention worsened.  Advised Lactaid and Gas-X as needed as her bloating sometimes does improve.  GERD: Well-controlled with avoidance of dietary triggers and pantoprazole 40 mg daily.  Colonoscopy recently updated in July with evidence of tubular adenomas and sessile serrated adenomas.  Given her age she is not in need of any surveillance follow-up unless she were to develop any new symptoms such as rectal bleeding, rectal pain, or significant weight loss.  PLAN   Lactaid as needed Gas ex as needed Continue pantoprazole 40 mg once daily GERD diet Continue miralax daily and high fiber diet.  Can consider ultrasound of abdomen given epigastric tenderness and mild distention. Follow up in 6 months    Brooke Bonito, MSN, FNP-BC, AGACNP-BC [facility]

## 2023-04-03 ENCOUNTER — Ambulatory Visit (INDEPENDENT_AMBULATORY_CARE_PROVIDER_SITE_OTHER): Payer: Medicare Other

## 2023-04-03 VITALS — BP 135/72 | HR 87 | Ht 65.0 in | Wt 174.0 lb

## 2023-04-03 DIAGNOSIS — Z780 Asymptomatic menopausal state: Secondary | ICD-10-CM

## 2023-04-03 DIAGNOSIS — Z0000 Encounter for general adult medical examination without abnormal findings: Secondary | ICD-10-CM | POA: Diagnosis not present

## 2023-04-03 NOTE — Patient Instructions (Addendum)
Ms. Padget , Thank you for taking time to come for your Medicare Wellness Visit. I appreciate your ongoing commitment to your health goals. Please review the following plan we discussed and let me know if I can assist you in the future.   Referrals/Orders/Follow-Ups/Clinician Recommendations:  You have an order for:  []   2D Mammogram  []   3D Mammogram  [x]   Bone Density   []   Lung Cancer Screening  Please call for appointment:   [facility] at [facility] [address] [place], [place] [address] [phone]  Make sure to wear two-piece clothing.  No lotions powders or deodorants the day of the appointment Make sure to bring picture ID and insurance card.  Bring list of medications you are currently taking including any supplements.   Schedule your [place] screening mammogram through MyChart!   Log into your MyChart account.  Go to 'Visit' (or 'Appointments' if on mobile App) --> Schedule an Appointment  Under 'Select a Reason for Visit' choose the Mammogram Screening option.  Complete the pre-visit questions and select the time and place that best fits your schedule.    This is a list of the screening recommended for you and due dates:  Health Maintenance  Topic Date Due   DTaP/Tdap/Td vaccine (3 - Tdap) 03/18/2019   COVID-19 Vaccine (6 - 2023-24 season) 03/24/2023   Flu Shot  02/21/2024*   Medicare Annual Wellness Visit  04/02/2024   Pneumonia Vaccine  Completed   DEXA scan (bone density measurement)  Completed   Zoster (Shingles) Vaccine  Completed   HPV Vaccine  Aged Out   Hepatitis C Screening  Discontinued  *Topic was postponed. The date shown is not the original due date.    Advanced directives: (ACP Link)Information on Advanced Care Planning can be found at [facility] Directives Advance Health Care Directives ([email or web address]   Next Medicare Annual Wellness Visit scheduled for next year: Yes    June 17, 2024 at 10:00am   Preventive Care 65 Years and Older, Female Preventive care refers to lifestyle choices and visits with your health care provider that can promote health and wellness. Preventive care visits are also called wellness exams. What can I expect for my preventive care visit? Counseling Your health care provider may ask you questions about your: Medical history, including: Past medical problems. Family medical history. Pregnancy and menstrual history. History of falls. Current health, including: Memory and ability to understand (cognition). Emotional well-being. Home life and relationship well-being. Sexual activity and sexual health. Lifestyle, including: Alcohol, nicotine or tobacco, and drug use. Access to firearms. Diet, exercise, and sleep habits. Work and work Astronomer. Sunscreen use. Safety issues such as seatbelt and bike helmet use. Physical exam Your health care provider will check your: Height and weight. These may be used to calculate your BMI (body mass index). BMI is a measurement that tells if you are at a healthy weight. Waist circumference. This measures the distance around your waistline. This measurement also tells if you are at a healthy weight and may help predict your risk of certain diseases, such as type 2 diabetes and high blood pressure. Heart rate and blood pressure. Body temperature. Skin for abnormal spots. What immunizations do I need?  Vaccines are usually given at various ages, according to a schedule. Your health care provider will recommend vaccines for you based on your age, medical history, and lifestyle or other factors, such as travel or where  you work. What tests do I need? Screening Your health care provider may recommend screening tests for certain conditions. This may include: Lipid and cholesterol levels. Hepatitis C test. Hepatitis B test. HIV (human immunodeficiency virus) test. STI (sexually transmitted  infection) testing, if you are at risk. Lung cancer screening. Colorectal cancer screening. Diabetes screening. This is done by checking your blood sugar (glucose) after you have not eaten for a while (fasting). Mammogram. Talk with your health care provider about how often you should have regular mammograms. BRCA-related cancer screening. This may be done if you have a family history of breast, ovarian, tubal, or peritoneal cancers. Bone density scan. This is done to screen for osteoporosis. Talk with your health care provider about your test results, treatment options, and if necessary, the need for more tests. Follow these instructions at home: Eating and drinking  Eat a diet that includes fresh fruits and vegetables, whole grains, lean protein, and low-fat dairy products. Limit your intake of foods with high amounts of sugar, saturated fats, and salt. Take vitamin and mineral supplements as recommended by your health care provider. Do not drink alcohol if your health care provider tells you not to drink. If you drink alcohol: Limit how much you have to 0-1 drink a day. Know how much alcohol is in your drink. In the [place] one drink equals one 12 oz bottle of beer (355 mL), one 5 oz glass of wine (148 mL), or one 1 oz glass of hard liquor (44 mL). Lifestyle Brush your teeth every morning and night with fluoride toothpaste. Floss one time each day. Exercise for at least 30 minutes 5 or more days each week. Do not use any products that contain nicotine or tobacco. These products include cigarettes, chewing tobacco, and vaping devices, such as e-cigarettes. If you need help quitting, ask your health care provider. Do not use drugs. If you are sexually active, practice safe sex. Use a condom or other form of protection in order to prevent STIs. Take aspirin only as told by your health care provider. Make sure that you understand how much to take and what form to take. Work with your health care  provider to find out whether it is safe and beneficial for you to take aspirin daily. Ask your health care provider if you need to take a cholesterol-lowering medicine (statin). Find healthy ways to manage stress, such as: Meditation, yoga, or listening to music. Journaling. Talking to a trusted person. Spending time with friends and family. Minimize exposure to UV radiation to reduce your risk of skin cancer. Safety Always wear your seat belt while driving or riding in a vehicle. Do not drive: If you have been drinking alcohol. Do not ride with someone who has been drinking. When you are tired or distracted. While texting. If you have been using any mind-altering substances or drugs. Wear a helmet and other protective equipment during sports activities. If you have firearms in your house, make sure you follow all gun safety procedures. What's next? Visit your health care provider once a year for an annual wellness visit. Ask your health care provider how often you should have your eyes and teeth checked. Stay up to date on all vaccines. This information is not intended to replace advice given to you by your health care provider. Make sure you discuss any questions you have with your health care provider. Document Revised: 01/04/2021 Document Reviewed: 01/04/2021 Elsevier Patient Education  2024 [employer]. Understanding Your Risk for Falls  Millions of people have serious injuries from falls each year. It is important to understand your risk of falling. Talk with your health care provider about your risk and what you can do to lower it. If you do have a serious fall, make sure to tell your provider. Falling once raises your risk of falling again. How can falls affect me? Serious injuries from falls are common. These include: Broken bones, such as hip fractures. Head injuries, such as traumatic brain injuries (TBI) or concussions. A fear of falling can cause you to avoid activities  and stay at home. This can make your muscles weaker and raise your risk for a fall. What can increase my risk? There are a number of risk factors that increase your risk for falling. The more risk factors you have, the higher your risk of falling. Serious injuries from a fall happen most often to people who are older than 80 years old. Teenagers and young adults ages 88-29 are also at higher risk. Common risk factors include: Weakness in the lower body. Being generally weak or confused due to long-term (chronic) illness. Dizziness or balance problems. Poor vision. Medicines that cause dizziness or drowsiness. These may include: Medicines for your blood pressure, heart, anxiety, insomnia, or swelling (edema). Pain medicines. Muscle relaxants. Other risk factors include: Drinking alcohol. Having had a fall in the past. Having foot pain or wearing improper footwear. Working at a dangerous job. Having any of the following in your home: Tripping hazards, such as floor clutter or loose rugs. Poor lighting. Pets. Having dementia or memory loss. What actions can I take to lower my risk of falling?     Physical activity Stay physically fit. Do strength and balance exercises. Consider taking a regular class to build strength and balance. Yoga and tai chi are good options. Vision Have your eyes checked every year and your prescription for glasses or contacts updated as needed. Shoes and walking aids Wear non-skid shoes. Wear shoes that have rubber soles and low heels. Do not wear high heels. Do not walk around the house in socks or slippers. Use a cane or walker as told by your provider. Home safety Attach secure railings on both sides of your stairs. Install grab bars for your bathtub, shower, and toilet. Use a non-skid mat in your bathtub or shower. Attach bath mats securely with double-sided, non-slip rug tape. Use good lighting in all rooms. Keep a flashlight near your bed. Make sure  there is a clear path from your bed to the bathroom. Use night-lights. Do not use throw rugs. Make sure all carpeting is taped or tacked down securely. Remove all clutter from walkways and stairways, including extension cords. Repair uneven or broken steps and floors. Avoid walking on icy or slippery surfaces. Walk on the grass instead of on icy or slick sidewalks. Use ice melter to get rid of ice on walkways in the winter. Use a cordless phone. Questions to ask your health care provider Can you help me check my risk for a fall? Do any of my medicines make me more likely to fall? Should I take a vitamin D supplement? What exercises can I do to improve my strength and balance? Should I make an appointment to have my vision checked? Do I need a bone density test to check for weak bones (osteoporosis)? Would it help to use a cane or a walker? Where to find more information Centers for Disease Control and Prevention, [employer]: TonerPromos.no Community-Based Fall Prevention Programs:  TonerPromos.no [employer] on Aging: BaseRingTones.pl Contact a health care provider if: You fall at home. You are afraid of falling at home. You feel weak, drowsy, or dizzy. This information is not intended to replace advice given to you by your health care provider. Make sure you discuss any questions you have with your health care provider. Document Revised: 03/12/2022 Document Reviewed: 03/12/2022 Elsevier Patient Education  2024 [employer].

## 2023-04-03 NOTE — Progress Notes (Signed)
 Because this visit was a virtual/telehealth visit,  certain criteria was not obtained, such a blood pressure, CBG if applicable, and timed get up and go. Any medications not marked as "taking" were not mentioned during the medication reconciliation part of the visit. Any vitals not documented were not able to be obtained due to this being a telehealth visit or patient was unable to self-report a recent blood pressure reading due to a lack of equipment at home via telehealth. Vitals that have been documented are verbally provided by the patient.   Subjective:   [name] is a 82 y.o. female who presents for Medicare Annual (Subsequent) preventive examination.  Visit Complete: Virtual  I connected with  [name] on 04/03/23 by a audio enabled telemedicine application and verified that I am speaking with the correct person using two identifiers.  Patient Location: Home  Provider Location: Home Office  I discussed the limitations of evaluation and management by telemedicine. The patient expressed understanding and agreed to proceed.  Patient Medicare AWV questionnaire was completed by the patient on na; I have confirmed that all information answered by patient is correct and no changes since this date.  Review of Systems     Cardiac Risk Factors include: advanced age (>17men, >2 women);dyslipidemia;hypertension;smoking/ tobacco exposure     Objective:    Today's Vitals   04/03/23 1034 04/03/23 1038  BP: 135/72   Pulse: 87   SpO2: 98%   Weight: 174 lb (78.9 kg)   Height: 5\' 5"  (1.651 m)   PainSc:  0-No pain   Body mass index is 28.96 kg/m.     04/03/2023   10:33 AM 01/29/2023   11:37 AM 08/24/2021   11:59 AM 03/07/2021    3:42 PM 03/01/2020    8:21 AM 02/05/2020   10:23 AM 02/24/2018    8:45 AM  Advanced Directives  Does Patient Have a Medical Advance Directive? No Yes Yes Yes Yes Yes No  Type of Advance Directive  Living will Healthcare Power of [place];Living will  Living will;Healthcare Power of [employer] Power of Attorney Living will   Does patient want to make changes to medical advance directive?     No - Patient declined  Yes (ED - Information included in AVS)  Copy of Healthcare Power of Attorney in Chart?     No - copy requested    Would patient like information on creating a medical advance directive? No - Patient declined          Current Medications (verified) Outpatient Encounter Medications as of 04/03/2023  Medication Sig   amLODipine (NORVASC) 5 MG tablet TAKE 1 TABLET(5 MG) BY MOUTH DAILY   aspirin EC 81 MG tablet Take 81 mg by mouth daily. Swallow whole.   carvedilol (COREG) 3.125 MG tablet Take 1 tablet (3.125 mg total) by mouth 2 (two) times daily with a meal.   cetirizine (ZYRTEC) 5 MG tablet Take one tablet by mouth two times daily as needed , for uncontrolled allergy symptoms   ezetimibe (ZETIA) 10 MG tablet Take 1 tablet (10 mg total) by mouth daily.   fluticasone (FLONASE) 50 MCG/ACT nasal spray Place 2 sprays into both nostrils daily as needed for allergies.   magnesium hydroxide (MILK OF MAGNESIA) 400 MG/5ML suspension Take 30 mLs by mouth daily as needed for mild constipation or moderate constipation.   Multiple Vitamin (MULTIVITAMIN) tablet Take 1 tablet by mouth daily.   olmesartan (BENICAR) 20 MG tablet TAKE 1 TABLET BY  MOUTH EVERY NIGHT AT 10 PM FOR BLOOD PRESSURE   oxymetazoline (VICKS SINEX 12 HOUR DECONGEST) 0.05 % nasal spray Place 1 spray into both nostrils 2 (two) times daily as needed for congestion.   pantoprazole (PROTONIX) 40 MG tablet TAKE 1 TABLET(40 MG) BY MOUTH DAILY BEFORE BREAKFAST   rosuvastatin (CRESTOR) 40 MG tablet Take 40 mg by mouth daily.   No facility-administered encounter medications on file as of 04/03/2023.    Allergies (verified) Patient has no known allergies.   History: Past Medical History:  Diagnosis Date   COPD (chronic obstructive pulmonary disease) (HCC)    Hiatal hernia  without gangrene and obstruction 03/23/2015   Hypertension 2004   Lung nodule seen on imaging study    Nicotine addiction    OA (osteoarthritis) of knee    Obesity    Sinusitis    Past Surgical History:  Procedure Laterality Date   bilateral cataract surgery  7/27 & 03/01/09   Dr. Nile Riggs     cholecystectomy     COLONOSCOPY  04/2004   Dr. Jerolyn Shin Smith-->melanosis coli   COLONOSCOPY WITH ESOPHAGOGASTRODUODENOSCOPY (EGD) N/A 02/25/2013   Dr. Fields:Normal mucosa in the terminal ileum/Severe melanosis throughout the entire examined colon/ONE COLON POLYP REMOVED (tubular adenoma)/Mild diverticulosis  in the sigmoid colon/EGD:The mucosa of the esophagus appeared normal/Non-erosive gastritis, empiric dilation with Savary dilator   COLONOSCOPY WITH PROPOFOL N/A 01/29/2023   Procedure: COLONOSCOPY WITH PROPOFOL;  Surgeon: Lanelle Bal, DO;  Location: [facility];  Service: Endoscopy;  Laterality: N/A;  930am, asa 2   ESOPHAGOGASTRODUODENOSCOPY  11/07/2010   Jenkins:hiatal hernia/no evidence of Barrett esophagitis.  The Z-line was noted to be at 39 cm from the teeth. CLO test negative   ESOPHAGOGASTRODUODENOSCOPY N/A 09/11/2013   Dr.Fields-  incomplete esophageal web in mid-esophagus, medium sized hialtal hernia, PUD. bx=focal erosion with inflammation and fibrosis   ESOPHAGOGASTRODUODENOSCOPY N/A 07/06/2014   healed ulcers, persistent erosions on aspirin, small hiatal hernia   ESOPHAGOGASTRODUODENOSCOPY N/A 02/05/2020   normal esophagus s/p dilation, normal stomach, normal duodenal bulb.    knee arthroscopy right     LEFT HEART CATHETERIZATION WITH CORONARY ANGIOGRAM N/A 04/08/2014   Procedure: LEFT HEART CATHETERIZATION WITH CORONARY ANGIOGRAM;  Surgeon: Lesleigh Noe, MD;  Location: [facility] CATH LAB;  Service: Cardiovascular;  Laterality: N/A;   MALONEY DILATION N/A 02/25/2013   Procedure: Elease Hashimoto DILATION;  Surgeon: West Bali, MD;  Location: [facility];  Service: Endoscopy;  Laterality:  N/A;   PARTIAL HYSTERECTOMY     POLYPECTOMY  01/29/2023   Procedure: POLYPECTOMY;  Surgeon: Lanelle Bal, DO;  Location: [facility];  Service: Endoscopy;;   SAVORY DILATION N/A 02/25/2013   Procedure: SAVORY DILATION;  Surgeon: West Bali, MD;  Location: [facility];  Service: Endoscopy;  Laterality: N/A;   total knee arthroplasty right  05/29/2005   Dr. Romeo Apple    Family History  Problem Relation Age of Onset   Diabetes Mother    Hypertension Mother    Coronary artery disease Mother    Pneumonia Father    Hypertension Sister    Hypertension Sister    Hypertension Brother        Visual merchandiser   Diabetes Brother    Hypertension Brother    Colon cancer Neg Hx    Liver disease Neg Hx    Social History   Socioeconomic History   Marital status: Widowed    Spouse name: Not on file   Number of  children: 4   Years of education: Not on file   Highest education level: Not on file  Occupational History   Occupation: employed     Employer: RETIRED  Tobacco Use   Smoking status: Some Days    Current packs/day: 0.10    Average packs/day: 0.1 packs/day for 60.7 years (6.1 ttl pk-yrs)    Types: Cigarettes    Start date: 07/23/1962   Smokeless tobacco: Never   Tobacco comments:    1 per day   Vaping Use   Vaping status: Never Used  Substance and Sexual Activity   Alcohol use: No    Alcohol/week: 0.0 standard drinks of alcohol   Drug use: No   Sexual activity: Not Currently  Other Topics Concern   Not on file  Social History Narrative   Not on file   Social Determinants of Health   Financial Resource Strain: Low Risk  (04/03/2023)   Overall Financial Resource Strain (CARDIA)    Difficulty of Paying Living Expenses: Not hard at all  Food Insecurity: No Food Insecurity (04/03/2023)   Hunger Vital Sign    Worried About Running Out of Food in the Last Year: Never true    Ran Out of Food in the Last Year: Never true  Transportation Needs: No Transportation Needs  (04/03/2023)   PRAPARE - Administrator, Civil Service (Medical): No    Lack of Transportation (Non-Medical): No  Physical Activity: Insufficiently Active (04/03/2023)   Exercise Vital Sign    Days of Exercise per Week: 3 days    Minutes of Exercise per Session: 30 min  Stress: No Stress Concern Present (04/03/2023)   [employer] of Occupational Health - Occupational Stress Questionnaire    Feeling of Stress : Not at all  Social Connections: Moderately Integrated (04/03/2023)   Social Connection and Isolation Panel [NHANES]    Frequency of Communication with Friends and Family: More than three times a week    Frequency of Social Gatherings with Friends and Family: More than three times a week    Attends Religious Services: More than 4 times per year    Active Member of [employer] or Organizations: Yes    Attends Banker Meetings: More than 4 times per year    Marital Status: Widowed    Tobacco Counseling Ready to quit: Yes Counseling given: Yes Tobacco comments: 1 per day    Clinical Intake:  Pre-visit preparation completed: Yes  Pain : No/denies pain Pain Score: 0-No pain     BMI - recorded: 28.96 Nutritional Status: BMI 25 -29 Overweight Nutritional Risks: None Diabetes: No  How often do you need to have someone help you when you read instructions, pamphlets, or other written materials from your doctor or pharmacy?: 1 - Never  Interpreter Needed?: No  Information entered by ::  Shaquel Josephson, CMA   Activities of Daily Living    04/03/2023   11:02 AM  In your present state of health, do you have any difficulty performing the following activities:  Hearing? 0  Vision? 0  Difficulty concentrating or making decisions? 0  Walking or climbing stairs? 0  Dressing or bathing? 0  Doing errands, shopping? 0  Preparing Food and eating ? N  Using the Toilet? N  In the past six months, have you accidently leaked urine? N  Do you have problems with  loss of bowel control? N  Managing your Medications? N  Managing your Finances? N  Housekeeping or managing your Housekeeping?  N    Patient Care Team: Kerri Perches, MD as PCP - General Pricilla Riffle, MD as PCP - Cardiology (Cardiology) Laqueta Linden, MD (Inactive) as Attending Physician (Cardiology) West Bali, MD (Inactive) as Consulting Physician (Gastroenterology)  Indicate any recent Medical Services you may have received from other than [facility] providers in the past year (date may be approximate).     Assessment:   This is a routine wellness examination for [facility].  Hearing/Vision screen Hearing Screening - Comments:: Patient denies any hearing difficulties.   Vision Screening - Comments:: Wears rx glasses - up to date with routine eye exams at Dr. [facility]   Goals Addressed             This Visit's Progress    Patient Stated       Feel better from this hernia       Depression Screen    04/03/2023   10:54 AM 03/13/2023    1:20 PM 02/06/2023    1:06 PM 09/06/2022    1:11 PM 04/25/2022    1:27 PM 01/30/2022   11:45 AM 01/30/2022   11:04 AM  PHQ 2/9 Scores  PHQ - 2 Score 0 0 0 1 0 0 0  PHQ- 9 Score    3       Fall Risk    04/03/2023   11:02 AM 03/13/2023    1:20 PM 02/06/2023    1:06 PM 09/06/2022    1:11 PM 04/25/2022    1:27 PM  Fall Risk   Falls in the past year? 0 0 0 0 0  Number falls in past yr: 0 0 0 0 0  Injury with Fall? 0 0 0 0 0  Risk for fall due to : No Fall Risks No Fall Risks No Fall Risks No Fall Risks No Fall Risks  Follow up Falls prevention discussed Falls evaluation completed Falls evaluation completed Falls evaluation completed     MEDICARE RISK AT HOME: Medicare Risk at Home Any stairs in or around the home?: No If so, are there any without handrails?: No Home free of loose throw rugs in walkways, pet beds, electrical cords, etc?: Yes Adequate lighting in your home to reduce risk of falls?:  Yes Life alert?: No Use of a cane, walker or w/c?: No Grab bars in the bathroom?: No Shower chair or bench in shower?: No Elevated toilet seat or a handicapped toilet?: No  TIMED UP AND GO:  Was the test performed?  No    Cognitive Function:        04/03/2023   10:54 AM 03/28/2022    1:19 PM 03/07/2021    3:44 PM 03/01/2020    8:23 AM 02/26/2019    8:11 AM  6CIT Screen  What Year? 0 points 0 points 0 points 0 points 0 points  What month? 0 points 0 points 0 points 0 points 0 points  What time? 0 points 0 points 0 points 0 points 0 points  Count back from 20 0 points 0 points 0 points 0 points 0 points  Months in reverse 0 points 0 points 0 points 0 points 2 points  Repeat phrase 0 points 0 points 10 points 0 points 0 points  Total Score 0 points 0 points 10 points 0 points 2 points    Immunizations Immunization History  Administered Date(s) Administered   Fluad Quad(high Dose 65+) 04/27/2019, 04/14/2020, 04/20/2021, 04/25/2022   H1N1 06/21/2008  Influenza Split 05/20/2012   Influenza Whole 05/07/2007, 04/19/2008, 05/09/2010, 03/30/2011   Influenza, High Dose Seasonal PF 06/04/2018   Influenza,inj,Quad PF,6+ Mos 04/29/2013, 06/15/2014, 05/24/2015, 06/11/2016, 05/06/2017   Moderna Covid-19 Vaccine Bivalent Booster 83yrs & up 05/24/2022   Moderna SARS-COV2 Booster Vaccination 06/13/2021   PFIZER(Purple Top)SARS-COV-2 Vaccination 10/15/2019, 11/07/2019, 06/02/2020   PPD Test 09/01/2018, 09/01/2018, 04/18/2020   Pneumococcal Conjugate-13 02/16/2014   Pneumococcal Polysaccharide-23 04/19/2008   Td 07/24/1995, 03/17/2009   Zoster Recombinant(Shingrix) 05/24/2022, 11/15/2022    TDAP status: Due, Education has been provided regarding the importance of this vaccine. Advised may receive this vaccine at local pharmacy or Health Dept. Aware to provide a copy of the vaccination record if obtained from local pharmacy or Health Dept. Verbalized acceptance and understanding.  Flu Vaccine  status: Due, Education has been provided regarding the importance of this vaccine. Advised may receive this vaccine at local pharmacy or Health Dept. Aware to provide a copy of the vaccination record if obtained from local pharmacy or Health Dept. Verbalized acceptance and understanding.  Pneumococcal vaccine status: Up to date  Covid-19 vaccine status: Information provided on how to obtain vaccines.   Qualifies for Shingles Vaccine? Yes   Zostavax completed No   Shingrix Completed?: Yes  Screening Tests Health Maintenance  Topic Date Due   DTaP/Tdap/Td (3 - Tdap) 03/18/2019   INFLUENZA VACCINE  02/21/2023   COVID-19 Vaccine (6 - 2023-24 season) 03/24/2023   Medicare Annual Wellness (AWV)  02/12/2024   Pneumonia Vaccine 5+ Years old  Completed   DEXA SCAN  Completed   Zoster Vaccines- Shingrix  Completed   HPV VACCINES  Aged Out   Hepatitis C Screening  Discontinued    Health Maintenance  Health Maintenance Due  Topic Date Due   DTaP/Tdap/Td (3 - Tdap) 03/18/2019   INFLUENZA VACCINE  02/21/2023   COVID-19 Vaccine (6 - 2023-24 season) 03/24/2023    Colorectal cancer screening: No longer required.   Mammogram status: No longer required due to age.  Bone Density status: Ordered 04/03/2023. Pt provided with contact info and advised to call to schedule appt.  Lung Cancer Screening: (Low Dose CT Chest recommended if Age 69-80 years, 20 pack-year currently smoking OR have quit w/in 15years.) does not qualify.   Lung Cancer Screening Referral: n/a  Additional Screening:  Hepatitis C Screening: does not qualify; Completed 04/04/2020  Vision Screening: Recommended annual ophthalmology exams for early detection of glaucoma and other disorders of the eye. Is the patient up to date with their annual eye exam?  Yes  Who is the provider or what is the name of the office in which the patient attends annual eye exams? Dr. Daisy [facility]  Dental Screening: Recommended  annual dental exams for proper oral hygiene  Diabetic Foot Exam: na  Community Resource Referral / Chronic Care Management: CRR required this visit?  No   CCM required this visit?  No     Plan:     I have personally reviewed and noted the following in the patient's chart:   Medical and social history Use of alcohol, tobacco or illicit drugs  Current medications and supplements including opioid prescriptions. Patient is not currently taking opioid prescriptions. Functional ability and status Nutritional status Physical activity Advanced directives List of other physicians Hospitalizations, surgeries, and ER visits in previous 12 months Vitals Screenings to include cognitive, depression, and falls Referrals and appointments  In addition, I have reviewed and discussed with patient certain preventive protocols, quality metrics,  and best practice recommendations. A written personalized care plan for preventive services as well as general preventive health recommendations were provided to patient.     Jordan Hawks [address], CMA   04/03/2023   After Visit Summary: (MyChart) Due to this being a telephonic visit, the after visit summary with patients personalized plan was offered to patient via MyChart   Nurse Notes:

## 2023-04-24 ENCOUNTER — Encounter (HOSPITAL_COMMUNITY): Payer: Self-pay

## 2023-04-24 ENCOUNTER — Ambulatory Visit (HOSPITAL_COMMUNITY)
Admission: RE | Admit: 2023-04-24 | Discharge: 2023-04-24 | Disposition: A | Discharge status: Home / Self Care | Payer: Medicare Other | Source: Ambulatory Visit | Attending: Family Medicine | Admitting: Family Medicine

## 2023-04-24 DIAGNOSIS — Z1231 Encounter for screening mammogram for malignant neoplasm of breast: Secondary | ICD-10-CM | POA: Insufficient documentation

## 2023-04-26 ENCOUNTER — Other Ambulatory Visit: Payer: Self-pay | Admitting: Family Medicine

## 2023-05-09 DIAGNOSIS — J019 Acute sinusitis, unspecified: Secondary | ICD-10-CM | POA: Diagnosis not present

## 2023-05-09 DIAGNOSIS — N390 Urinary tract infection, site not specified: Secondary | ICD-10-CM | POA: Diagnosis not present

## 2023-05-09 DIAGNOSIS — U071 COVID-19: Secondary | ICD-10-CM | POA: Diagnosis not present

## 2023-05-09 DIAGNOSIS — J069 Acute upper respiratory infection, unspecified: Secondary | ICD-10-CM | POA: Diagnosis not present

## 2023-05-09 DIAGNOSIS — R030 Elevated blood-pressure reading, without diagnosis of hypertension: Secondary | ICD-10-CM | POA: Diagnosis not present

## 2023-05-19 ENCOUNTER — Encounter: Payer: Self-pay | Admitting: Pharmacist

## 2023-05-19 NOTE — Progress Notes (Signed)
Pharmacy Quality Measure Review  This patient is appearing on a report for being at risk of failing the adherence measure for hypertension (ACEi/ARB) medications this calendar year.   Medication: olmesartan 20 mg Last fill date: 10/5 for 30 day supply  Insurance report was not up to date. No action needed at this time.   Jarrett Ables, PharmD PGY-1 Pharmacy Resident

## 2023-07-02 DIAGNOSIS — J019 Acute sinusitis, unspecified: Secondary | ICD-10-CM | POA: Diagnosis not present

## 2023-07-02 DIAGNOSIS — J22 Unspecified acute lower respiratory infection: Secondary | ICD-10-CM | POA: Diagnosis not present

## 2023-08-09 ENCOUNTER — Encounter: Payer: Self-pay | Admitting: Family Medicine

## 2023-08-09 ENCOUNTER — Ambulatory Visit (INDEPENDENT_AMBULATORY_CARE_PROVIDER_SITE_OTHER): Payer: Medicare Other | Admitting: Family Medicine

## 2023-08-09 VITALS — BP 153/82 | HR 104 | Ht 65.0 in | Wt 173.0 lb

## 2023-08-09 DIAGNOSIS — E785 Hyperlipidemia, unspecified: Secondary | ICD-10-CM | POA: Diagnosis not present

## 2023-08-09 DIAGNOSIS — R7303 Prediabetes: Secondary | ICD-10-CM | POA: Diagnosis not present

## 2023-08-09 DIAGNOSIS — F17200 Nicotine dependence, unspecified, uncomplicated: Secondary | ICD-10-CM

## 2023-08-09 DIAGNOSIS — K219 Gastro-esophageal reflux disease without esophagitis: Secondary | ICD-10-CM | POA: Diagnosis not present

## 2023-08-09 DIAGNOSIS — J302 Other seasonal allergic rhinitis: Secondary | ICD-10-CM | POA: Diagnosis not present

## 2023-08-09 DIAGNOSIS — F17210 Nicotine dependence, cigarettes, uncomplicated: Secondary | ICD-10-CM | POA: Diagnosis not present

## 2023-08-09 DIAGNOSIS — I10 Essential (primary) hypertension: Secondary | ICD-10-CM

## 2023-08-09 DIAGNOSIS — E663 Overweight: Secondary | ICD-10-CM

## 2023-08-09 DIAGNOSIS — E876 Hypokalemia: Secondary | ICD-10-CM | POA: Diagnosis not present

## 2023-08-09 NOTE — Progress Notes (Signed)
[name]     MRN: [id number]      DOB: [birth year]  Chief Complaint  Patient presents with   Follow-up    Follow  up    HPI [name] is here for follow up and re-evaluation of chronic medical conditions, medication management and review of any available recent lab and radiology data.  Preventive health is updated, specifically  Cancer screening and Immunization.   Questions or concerns regarding consultations or procedures which the PT has had in the interim are  addressed. The PT denies any adverse reactions to current medications since the last visit.  Inxcreased cough and chest congestion x 2 weeks, no fever, chills, malaise or body aches, responds to home remedies   ROS Denies recent fever or chills. Denies sinus pressure, nasal congestion, ear pain or sore throat. Denies chest congestion, productive cough or wheezing. Denies chest pains, palpitations and leg swelling Denies abdominal pain, nausea, vomiting,diarrhea or constipation.   Denies dysuria, frequency, hesitancy or incontinence. Denies uncontrolled joint pain, swelling and limitation in mobility. Denies headaches, seizures, numbness, or tingling. Denies depression, anxiety or insomnia. Denies skin break down or rash.   PE  BP (!) 153/82 (BP Location: Left Arm, Patient Position: Sitting, Cuff Size: Large)   Pulse (!) 104   Ht 5\' 5"  (1.651 m)   Wt 173 lb (78.5 kg)   SpO2 95%   BMI 28.79 kg/m   Patient alert and oriented and in no cardiopulmonary distress.  HEENT: No facial asymmetry, EOMI,     Neck supple .  Chest: Clear to auscultation bilaterally.  CVS: S1, S2 no murmurs, no S3.Regular rate.  ABD: Soft non tender.   Ext: No edema  MS: decreased  ROM spine, shoulders, hips and knees.  Skin: Intact, no ulcerations or rash noted.  Psych: Good eye contact, normal affect. Memory intact not anxious or depressed appearing.  CNS: CN 2-12 intact, power,  normal throughout.no focal deficits  noted.   Assessment & Plan  Dyslipidemia (high LDL; low HDL) Hyperlipidemia:Low fat diet discussed and encouraged.   Lipid Panel  Lab Results  Component Value Date   CHOL 243 (H) 08/09/2023   HDL 39 (L) 08/09/2023   LDLCALC 170 (H) 08/09/2023   TRIG 184 (H) 08/09/2023   CHOLHDL 6.2 (H) 08/09/2023     Markedly out of control, needs to reduce fat intake and take crestor as prescribed , rept labs 1 wek before appt  NICOTINE ADDICTION Asked:confirms currently smokes cigarettes Assess: Unwilling to set a quit date, but is cutting back Advise: needs to QUIT to reduce risk of cancer, cardio and cerebrovascular disease Assist: counseled for 5 minutes and literature provided Arrange: follow up in 2 to 4 months   Prediabetes Patient educated about the importance of limiting  Carbohydrate intake , the need to commit to daily physical activity for a minimum of 30 minutes , and to commit weight loss. The fact that changes in all these areas will reduce or eliminate all together the development of diabetes is stressed.  Improved , which is good     Latest Ref Rng & Units 08/09/2023    2:18 PM 03/11/2023   10:42 AM 01/15/2023   10:16 AM 01/03/2023   10:34 AM 04/17/2022   11:31 AM  Diabetic Labs  HbA1c 4.8 - 5.6 % 5.7    5.8  6.0   Chol 100 - 199 mg/dL 409    811  914   HDL >78 mg/dL 39  54  47   Calc LDL 0 - 99 mg/dL 956    73  98   Triglycerides 0 - 149 mg/dL 387    564  332   Creatinine 0.57 - 1.00 mg/dL 9.51  8.84  1.66  0.63  1.50       08/09/2023    1:27 PM 08/09/2023    1:24 PM 04/03/2023   10:34 AM 04/01/2023    9:39 AM 03/13/2023    1:32 PM 03/13/2023    1:18 PM 02/06/2023    1:06 PM  BP/Weight  Systolic BP 153 153 135 135 120 120 154  Diastolic BP 82 78 72 72 80 70 82  Wt. (Lbs)  173 174 174.8  174.08   BMI  28.79 kg/m2 28.96 kg/m2 29.09 kg/m2  28.97 kg/m2       05/02/2010   12:00 AM  Foot/eye exam completion dates  Foot exam Order yes         This result is from  an external source.      Overweight (BMI 25.0-29.9)  Patient re-educated about  the importance of commitment to a  minimum of 150 minutes of exercise per week as able.  The importance of healthy food choices with portion control discussed, as well as eating regularly and within a 12 hour window most days. The need to choose "clean , green" food 50 to 75% of the time is discussed, as well as to make water the primary drink and set a goal of 64 ounces water daily.       08/09/2023    1:24 PM 04/03/2023   10:34 AM 04/01/2023    9:39 AM  Weight /BMI  Weight 173 lb 174 lb 174 lb 12.8 oz  Height 5\' 5"  (1.651 m) 5\' 5"  (1.651 m) 5\' 5"  (1.651 m)  BMI 28.79 kg/m2 28.96 kg/m2 29.09 kg/m2    Unchanged  Essential hypertension Elevated at visit, no med change, needs to take meds  consistently  DASH diet and commitment to daily physical activity for a minimum of 30 minutes discussed and encouraged, as a part of hypertension management. The importance of attaining a healthy weight is also discussed.     08/09/2023    1:27 PM 08/09/2023    1:24 PM 04/03/2023   10:34 AM 04/01/2023    9:39 AM 03/13/2023    1:32 PM 03/13/2023    1:18 PM 02/06/2023    1:06 PM  BP/Weight  Systolic BP 153 153 135 135 120 120 154  Diastolic BP 82 78 72 72 80 70 82  Wt. (Lbs)  173 174 174.8  174.08   BMI  28.79 kg/m2 28.96 kg/m2 29.09 kg/m2  28.97 kg/m2        GERD (gastroesophageal reflux disease) Controlled, no change in medication   Seasonal allergies Controlled, no change in medication

## 2023-08-09 NOTE — Patient Instructions (Addendum)
F/U in 41/2 monhts, call if you need me sooner  NEED to take BP meds as prescribed  PLEASE STOP smoking as of today!  Labs already ordered are due please get them today  Lung exam is good, just take your home remedies  Please schedule  bone density at checkout  TdAP Is past due please get at your pharmacy  Thanks for choosing [facility], we consider it a privelige to serve you.

## 2023-08-10 LAB — CMP14+EGFR
ALT: 6 [IU]/L (ref 0–32)
AST: 11 [IU]/L (ref 0–40)
Albumin: 4.2 g/dL (ref 3.8–4.8)
Alkaline Phosphatase: 115 [IU]/L (ref 44–121)
BUN/Creatinine Ratio: 8 — ABNORMAL LOW (ref 12–28)
BUN: 12 mg/dL (ref 8–27)
Bilirubin Total: 0.5 mg/dL (ref 0.0–1.2)
CO2: 24 mmol/L (ref 20–29)
Calcium: 9.3 mg/dL (ref 8.7–10.3)
Chloride: 101 mmol/L (ref 96–106)
Creatinine, Ser: 1.44 mg/dL — ABNORMAL HIGH (ref 0.57–1.00)
Globulin, Total: 3 g/dL (ref 1.5–4.5)
Glucose: 94 mg/dL (ref 70–99)
Potassium: 4.6 mmol/L (ref 3.5–5.2)
Sodium: 139 mmol/L (ref 134–144)
Total Protein: 7.2 g/dL (ref 6.0–8.5)
eGFR: 37 mL/min/{1.73_m2} — ABNORMAL LOW (ref >59)

## 2023-08-10 LAB — LIPID PANEL
Chol/HDL Ratio: 6.2 {ratio} — ABNORMAL HIGH (ref 0.0–4.4)
Cholesterol, Total: 243 mg/dL — ABNORMAL HIGH (ref 100–199)
HDL: 39 mg/dL — ABNORMAL LOW (ref >39)
LDL Chol Calc (NIH): 170 mg/dL — ABNORMAL HIGH (ref 0–99)
Triglycerides: 184 mg/dL — ABNORMAL HIGH (ref 0–149)
VLDL Cholesterol Cal: 34 mg/dL (ref 5–40)

## 2023-08-10 LAB — MAGNESIUM: Magnesium: 1.9 mg/dL (ref 1.6–2.3)

## 2023-08-10 LAB — HEMOGLOBIN A1C
Est. average glucose Bld gHb Est-mCnc: 117 mg/dL
Hgb A1c MFr Bld: 5.7 % — ABNORMAL HIGH (ref 4.8–5.6)

## 2023-08-18 MED ORDER — ROSUVASTATIN CALCIUM 40 MG PO TABS: 40.0000 mg | ORAL_TABLET | Freq: Every day | ORAL | 3 refills | Status: AC

## 2023-08-18 NOTE — Addendum Note (Signed)
Addended by: Kerri Perches on: 08/18/2023 09:44 AM   Modules accepted: Orders

## 2023-08-21 ENCOUNTER — Other Ambulatory Visit: Payer: Self-pay

## 2023-08-21 DIAGNOSIS — I10 Essential (primary) hypertension: Secondary | ICD-10-CM

## 2023-08-21 DIAGNOSIS — E785 Hyperlipidemia, unspecified: Secondary | ICD-10-CM

## 2023-08-27 ENCOUNTER — Ambulatory Visit (HOSPITAL_COMMUNITY)
Admission: RE | Admit: 2023-08-27 | Discharge: 2023-08-27 | Disposition: A | Discharge status: Home / Self Care | Payer: Medicare Other | Source: Ambulatory Visit | Attending: Family Medicine | Admitting: Family Medicine

## 2023-08-27 DIAGNOSIS — M8589 Other specified disorders of bone density and structure, multiple sites: Secondary | ICD-10-CM | POA: Diagnosis not present

## 2023-08-27 DIAGNOSIS — Z780 Asymptomatic menopausal state: Secondary | ICD-10-CM | POA: Diagnosis not present

## 2023-09-04 ENCOUNTER — Encounter: Payer: Self-pay | Admitting: Family Medicine

## 2023-09-10 NOTE — Assessment & Plan Note (Signed)
 Controlled, no change in medication

## 2023-09-10 NOTE — Assessment & Plan Note (Addendum)
Patient educated about the importance of limiting  Carbohydrate intake , the need to commit to daily physical activity for a minimum of 30 minutes , and to commit weight loss. The fact that changes in all these areas will reduce or eliminate all together the development of diabetes is stressed.  Improved , which is good     Latest Ref Rng & Units 08/09/2023    2:18 PM 03/11/2023   10:42 AM 01/15/2023   10:16 AM 01/03/2023   10:34 AM 04/17/2022   11:31 AM  Diabetic Labs  HbA1c 4.8 - 5.6 % 5.7    5.8  6.0   Chol 100 - 199 mg/dL 161    096  045   HDL >40 mg/dL 39    54  47   Calc LDL 0 - 99 mg/dL 981    73  98   Triglycerides 0 - 149 mg/dL 191    478  295   Creatinine 0.57 - 1.00 mg/dL 6.21  3.08  6.57  8.46  1.50       08/09/2023    1:27 PM 08/09/2023    1:24 PM 04/03/2023   10:34 AM 04/01/2023    9:39 AM 03/13/2023    1:32 PM 03/13/2023    1:18 PM 02/06/2023    1:06 PM  BP/Weight  Systolic BP 153 153 135 135 120 120 154  Diastolic BP 82 78 72 72 80 70 82  Wt. (Lbs)  173 174 174.8  174.08   BMI  28.79 kg/m2 28.96 kg/m2 29.09 kg/m2  28.97 kg/m2       05/02/2010   12:00 AM  Foot/eye exam completion dates  Foot exam Order yes         This result is from an external source.

## 2023-09-10 NOTE — Assessment & Plan Note (Signed)
Asked:confirms currently smokes cigarettes °Assess: Unwilling to set a quit date, but is cutting back °Advise: needs to QUIT to reduce risk of cancer, cardio and cerebrovascular disease °Assist: counseled for 5 minutes and literature provided °Arrange: follow up in 2 to 4 months ° °

## 2023-09-10 NOTE — Assessment & Plan Note (Addendum)
Hyperlipidemia:Low fat diet discussed and encouraged.   Lipid Panel  Lab Results  Component Value Date   CHOL 243 (H) 08/09/2023   HDL 39 (L) 08/09/2023   LDLCALC 170 (H) 08/09/2023   TRIG 184 (H) 08/09/2023   CHOLHDL 6.2 (H) 08/09/2023     Markedly out of control, needs to reduce fat intake and take crestor as prescribed , rept labs 1 wek before appt

## 2023-09-10 NOTE — Assessment & Plan Note (Signed)
  Patient re-educated about  the importance of commitment to a  minimum of 150 minutes of exercise per week as able.  The importance of healthy food choices with portion control discussed, as well as eating regularly and within a 12 hour window most days. The need to choose "clean , green" food 50 to 75% of the time is discussed, as well as to make water the primary drink and set a goal of 64 ounces water daily.       08/09/2023    1:24 PM 04/03/2023   10:34 AM 04/01/2023    9:39 AM  Weight /BMI  Weight 173 lb 174 lb 174 lb 12.8 oz  Height 5\' 5"  (1.651 m) 5\' 5"  (1.651 m) 5\' 5"  (1.651 m)  BMI 28.79 kg/m2 28.96 kg/m2 29.09 kg/m2    Unchanged

## 2023-09-10 NOTE — Assessment & Plan Note (Signed)
Elevated at visit, no med change, needs to take meds  consistently  DASH diet and commitment to daily physical activity for a minimum of 30 minutes discussed and encouraged, as a part of hypertension management. The importance of attaining a healthy weight is also discussed.     08/09/2023    1:27 PM 08/09/2023    1:24 PM 04/03/2023   10:34 AM 04/01/2023    9:39 AM 03/13/2023    1:32 PM 03/13/2023    1:18 PM 02/06/2023    1:06 PM  BP/Weight  Systolic BP 153 153 135 135 120 120 154  Diastolic BP 82 78 72 72 80 70 82  Wt. (Lbs)  173 174 174.8  174.08   BMI  28.79 kg/m2 28.96 kg/m2 29.09 kg/m2  28.97 kg/m2

## 2023-09-24 ENCOUNTER — Encounter: Payer: Self-pay | Admitting: Gastroenterology

## 2023-11-12 ENCOUNTER — Ambulatory Visit: Admitting: Gastroenterology

## 2023-11-12 ENCOUNTER — Encounter: Payer: Self-pay | Admitting: Gastroenterology

## 2023-11-12 VITALS — BP 133/70 | HR 85 | Temp 98.7°F | Ht 65.0 in | Wt 170.4 lb

## 2023-11-12 DIAGNOSIS — R140 Abdominal distension (gaseous): Secondary | ICD-10-CM

## 2023-11-12 DIAGNOSIS — K219 Gastro-esophageal reflux disease without esophagitis: Secondary | ICD-10-CM

## 2023-11-12 DIAGNOSIS — K5900 Constipation, unspecified: Secondary | ICD-10-CM

## 2023-11-12 NOTE — Progress Notes (Signed)
 Ethyl Hering,, that is a they have heard on for  GI Office Note    Referring Provider: Towanda Fret, MD Primary Care Physician:  Towanda Fret, MD Primary Gastroenterologist: Rolando Cliche. Mordechai April, DO  Date:  11/12/2023  ID:  [name], DOB 08-03-1940, MRN [id number]  Chief Complaint   Chief Complaint  Patient presents with   Follow-up    Pt following up on constipation. Pt states constipation better   History of Present Illness  [name] is a 83 y.o. female with a history of chronic GERD, hiatal hernia, COPD, HTN, and osteoarthritis presenting today for annual follow-up of GERD.  Colonoscopy in 2014 with 1 small tubular adenoma removed from the sigmoid colon.  Recommended repeat in 10 years.   EGD July 2021: -Normal esophagus s/p dilation -Normal stomach -normal duodenum -Advised to stop pantoprazole  and trial Nexium daily. -If weight loss continued then recommended CTA for evaluation of mesenteric ischemia.   Office visit 01/03/2022.  GERD controlled on pantoprazole  once daily.  Does not need refills.  Also with chronic constipation.  Did note some intermittent abdominal bloating.  Previously given Linzess  samples but she was unable to afford this.  Taking milk of magnesia as needed which controls her symptoms.  No melena or BRBPR.  She was advised to continue milk of magnesia as needed for constipation and PPI once daily for reflux.  Advised to discuss risk versus benefits of repeat colonoscopy at next visit.   OV 12/25/22.  Reported when coughing she feels like something is pressing on her hernia site and then releases and goes down.  Had been coughing up phlegm at night and a yellowish color.  Does not take her allergy medicine regularly.  About a month prior she had taken antibiotics and prednisone  for her cough and phlegm.  Reflux doing well on pantoprazole  and avoiding triggers.  Noted a lot of gas moving from the mid to lower abdomen especially after eating steamed  cabbage, rice, and broccoli.  Had been taking Gas-X as needed.  Drinks ginger ale to help belch and to help with gas relief.  Denied any chest pain or shortness of breath.  Constipation is occasional.  Advised to proceed with colonoscopy, continue PPI.  Start Amitiza  and will trial MiraLAX if no improvement with Amitiza .  Continue milk of mag and Gas-X as needed.   Amitiza  was too expensive.  Provided some samples of Linzess .  Offered patient assistance for Amitiza .   Colonoscopy 01/29/2023: - Non- bleeding internal hemorrhoids.  - Diverticulosis in the sigmoid colon. - One 4 mm polyp in the cecum, removed with a cold snare. Resected and retrieved.  - Two 3 to 6 mm polyps in the sigmoid colon and in the transverse colon, removed with a cold snare. Resected and retrieved.  - The examination was otherwise normal. -Pathology revealed tubular adenoma and sessile serrated adenoma without dysplasia.  No repeat colonoscopy needed due to age.   Last office visit 04/01/23.  Linzess  had worked well for her, taking MiraLAX with orange juice and had found out that bananas with the breakfast drink in the morning times would help her go.  Still having some bloating.  Burping after ginger ale but having good relief of symptoms.  Eating more fruits and baking chicken, rarely eating red meat.  Reflux well-controlled with medication and dietary avoidance.  Usually will use heat to help with hemorrhoid issues.  Advise Lactaid and Gas-X as needed for bloating.  Continue PPI daily as  well as continuing MiraLAX and high-fiber diet.  Consider ultrasound of abdomen if ongoing epigastric tenderness and mild distention.  Follow-up 6 months.  Today:  Taking a carnation instant breakfast mix and with bananas she is going good but still has gas. Passes gas with each bowel movement. Feels full of gas and states she has abdominal distention. Feels good if she has a large bowel movement. If she takes aloe vera she can have relief of  symptoms. Not taking miralax. Goes everyday currently. Love peanut butter and crackers.   Reflux is doing well on medication.  Denies nausea, vomiting, dysphagia, lack of appetite, early satiety, weight loss.  Wt Readings from Last 3 Encounters:  11/12/23 170 lb 6.4 oz (77.3 kg)  08/09/23 173 lb (78.5 kg)  04/03/23 174 lb (78.9 kg)    Current Outpatient Medications  Medication Sig Dispense Refill   amLODipine  (NORVASC ) 5 MG tablet TAKE 1 TABLET(5 MG) BY MOUTH DAILY 90 tablet 2   carvedilol  (COREG ) 3.125 MG tablet Take 1 tablet (3.125 mg total) by mouth 2 (two) times daily with a meal. 60 tablet 3   cetirizine  (ZYRTEC ) 5 MG tablet Take one tablet by mouth two times daily as needed , for uncontrolled allergy symptoms 60 tablet 5   fluticasone  (FLONASE ) 50 MCG/ACT nasal spray Place 2 sprays into both nostrils daily as needed for allergies.     magnesium  hydroxide (MILK OF MAGNESIA) 400 MG/5ML suspension Take 30 mLs by mouth daily as needed for mild constipation or moderate constipation.     Multiple Vitamin (MULTIVITAMIN) tablet Take 1 tablet by mouth daily.     olmesartan  (BENICAR ) 20 MG tablet TAKE 1 TABLET BY MOUTH EVERY NIGHT AT 10 PM FOR BLOOD PRESSURE 30 tablet 4   oxymetazoline (VICKS SINEX 12 HOUR DECONGEST) 0.05 % nasal spray Place 1 spray into both nostrils 2 (two) times daily as needed for congestion.     pantoprazole  (PROTONIX ) 40 MG tablet TAKE 1 TABLET(40 MG) BY MOUTH DAILY BEFORE BREAKFAST 90 tablet 1   rosuvastatin  (CRESTOR ) 40 MG tablet Take 1 tablet (40 mg total) by mouth daily. 90 tablet 3   aspirin  EC 81 MG tablet Take 81 mg by mouth daily. Swallow whole. (Patient not taking: Reported on 11/12/2023)     rosuvastatin  (CRESTOR ) 40 MG tablet Take 40 mg by mouth daily. (Patient not taking: Reported on 08/09/2023)     No current facility-administered medications for this visit.    Past Medical History:  Diagnosis Date   COPD (chronic obstructive pulmonary disease) (HCC)     Hiatal hernia without gangrene and obstruction 03/23/2015   Hypertension 2004   Lung nodule seen on imaging study    Nicotine  addiction    OA (osteoarthritis) of knee    Obesity    Sinusitis     Past Surgical History:  Procedure Laterality Date   bilateral cataract surgery  7/27 & 03/01/09   Dr. Gennie Kicks     cholecystectomy     COLONOSCOPY  04/2004   Dr. Fredericka James Smith-->melanosis coli   COLONOSCOPY WITH ESOPHAGOGASTRODUODENOSCOPY (EGD) N/A 02/25/2013   Dr. Fields:Normal mucosa in the terminal ileum/Severe melanosis throughout the entire examined colon/ONE COLON POLYP REMOVED (tubular adenoma)/Mild diverticulosis  in the sigmoid colon/EGD:The mucosa of the esophagus appeared normal/Non-erosive gastritis, empiric dilation with Savary dilator   COLONOSCOPY WITH PROPOFOL  N/A 01/29/2023   Procedure: COLONOSCOPY WITH PROPOFOL ;  Surgeon: Vinetta Greening, DO;  Location: [facility];  Service: Endoscopy;  Laterality: N/A;  930am, asa  2   ESOPHAGOGASTRODUODENOSCOPY  11/07/2010   Jenkins:hiatal hernia/no evidence of Barrett esophagitis.  The Z-line was noted to be at 39 cm from the teeth. CLO test negative   ESOPHAGOGASTRODUODENOSCOPY N/A 09/11/2013   Dr.Fields-  incomplete esophageal web in mid-esophagus, medium sized hialtal hernia, PUD. bx=focal erosion with inflammation and fibrosis   ESOPHAGOGASTRODUODENOSCOPY N/A 07/06/2014   healed ulcers, persistent erosions on aspirin , small hiatal hernia   ESOPHAGOGASTRODUODENOSCOPY N/A 02/05/2020   normal esophagus s/p dilation, normal stomach, normal duodenal bulb.    knee arthroscopy right     LEFT HEART CATHETERIZATION WITH CORONARY ANGIOGRAM N/A 04/08/2014   Procedure: LEFT HEART CATHETERIZATION WITH CORONARY ANGIOGRAM;  Surgeon: Mickiel Albany, MD;  [facility] CATH LAB;  Service: Cardiovascular;  Laterality: N/A;   MALONEY DILATION N/A 02/25/2013   Procedure: Londa Rival DILATION;  Surgeon: Alyce Jubilee, MD;  Location: [facility];  Service:  Endoscopy;  Laterality: N/A;   PARTIAL HYSTERECTOMY     POLYPECTOMY  01/29/2023   Procedure: POLYPECTOMY;  Surgeon: Vinetta Greening, DO;  Location: [facility];  Service: Endoscopy;;   SAVORY DILATION N/A 02/25/2013   Procedure: SAVORY DILATION;  Surgeon: Alyce Jubilee, MD;  Location: [facility];  Service: Endoscopy;  Laterality: N/A;   total knee arthroplasty right  05/29/2005   Dr. Phyllis Breeze     Family History  Problem Relation Age of Onset   Diabetes Mother    Hypertension Mother    Coronary artery disease Mother    Pneumonia Father    Hypertension Sister    Hypertension Sister    Hypertension Brother        pace maker   Diabetes Brother    Hypertension Brother    Colon cancer Neg Hx    Liver disease Neg Hx     Allergies as of 11/12/2023   (No Known Allergies)    Social History   Socioeconomic History   Marital status: Widowed    Spouse name: Not on file   Number of children: 4   Years of education: Not on file   Highest education level: Not on file  Occupational History   Occupation: employed     Employer: RETIRED  Tobacco Use   Smoking status: Some Days    Current packs/day: 0.10    Average packs/day: 0.1 packs/day for 61.3 years (6.1 ttl pk-yrs)    Types: Cigarettes    Start date: 07/23/1962   Smokeless tobacco: Never   Tobacco comments:    1 per day   Vaping Use   Vaping status: Never Used  Substance and Sexual Activity   Alcohol use: No    Alcohol/week: 0.0 standard drinks of alcohol   Drug use: No   Sexual activity: Not Currently  Other Topics Concern   Not on file  Social History Narrative   Not on file   Social Drivers of Health   Financial Resource Strain: Low Risk  (04/03/2023)   Overall Financial Resource Strain (CARDIA)    Difficulty of Paying Living Expenses: Not hard at all  Food Insecurity: No Food Insecurity (04/03/2023)   Hunger Vital Sign    Worried About Running Out of Food in the Last Year: Never true    Ran Out of Food in the  Last Year: Never true  Transportation Needs: No Transportation Needs (04/03/2023)   PRAPARE - Administrator, Civil Service (Medical): No    Lack of Transportation (Non-Medical): No  Physical Activity: Insufficiently Active (  04/03/2023)   Exercise Vital Sign    Days of Exercise per Week: 3 days    Minutes of Exercise per Session: 30 min  Stress: No Stress Concern Present (04/03/2023)   [employer] of Occupational Health - Occupational Stress Questionnaire    Feeling of Stress : Not at all  Social Connections: Moderately Integrated (04/03/2023)   Social Connection and Isolation Panel [NHANES]    Frequency of Communication with Friends and Family: More than three times a week    Frequency of Social Gatherings with Friends and Family: More than three times a week    Attends Religious Services: More than 4 times per year    Active Member of [employer] or Organizations: Yes    Attends Banker Meetings: More than 4 times per year    Marital Status: Widowed     Review of Systems   Gen: Denies fever, chills, anorexia. Denies fatigue, weakness, weight loss.  CV: Denies chest pain, palpitations, syncope, peripheral edema, and claudication. Resp: Denies dyspnea at rest, cough, wheezing, coughing up blood, and pleurisy. GI: See HPI Derm: Denies rash, itching, dry skin Psych: Denies depression, anxiety, memory loss, confusion. No homicidal or suicidal ideation.  Heme: Denies bruising, bleeding, and enlarged lymph nodes.  Physical Exam   BP 133/70   Pulse 85   Temp 98.7 F (37.1 C)   Ht 5\' 5"  (1.651 m)   Wt 170 lb 6.4 oz (77.3 kg)   BMI 28.36 kg/m   General:   Alert and oriented. No distress noted. Pleasant and cooperative.  Head:  Normocephalic and atraumatic. Eyes:  Conjuctiva clear without scleral icterus. Mouth:  Oral mucosa pink and moist. Good dentition. No lesions. Abdomen:  +BS, soft, non-tender and non-distended. No rebound or guarding. No HSM or masses  noted. Rectal: deferred Msk:  Symmetrical without gross deformities. Normal posture. Neurologic:  Alert and  oriented x4. Psych:  Alert and cooperative. Normal mood and affect.  Assessment  [name] is a 83 y.o. female with a history of chronic GERD, hiatal hernia, COPD, HTN, osteoarthritis, and constipation presenting today for follow-up.   Constipation, bloating: Currently doing well with breakfast shake as well as banana every morning for breakfast.  Usually going without ease, not having to strain and having soft formed bowel movements on a daily basis.  Still occasionally has some bloating and does have some improvement after large bowel movement.  Suspect bloating and perceptive abdominal distention secondary to lactose intolerance and other dietary factors.  Continue to encourage Gas-X as needed.  She recently stopped taking probiotic and aloe vera supplementation due to fear of interactions however did feel as though it helped her symptoms.   GERD: Well-controlled with pantoprazole  40 mg once daily and avoidance of typical dietary triggers.  PLAN   Continue pantoprazole  40 mg once daily GERD diet May continue probiotic and aloe vera supplementation Gas-X as needed.  Discussed possibility of lactose intolerance. Continue to avoid, gas producing foods. Continue high-fiber diet. Follow-up 6 months   Julian Obey, MSN, FNP-BC, AGACNP-BC [facility]

## 2023-11-12 NOTE — Patient Instructions (Addendum)
 Please continue your current diet is helping control your constipation.  For your bloating, she to continue taking your probiotic and if you would like to resume your aloe vera to help with your bowel movements and your bloating I am fine with that.  If you have any worsening of your constipation symptoms please let me know.  I would continue to use Gas-X as needed.  Avoid gas-producing foods (eg, cabbage, legumes, onions, broccoli, brussel sprouts, wheat, and potatoes).   Continue taking your pantoprazole  once daily for your reflux.  Continue to avoid any spicy foods or fried/greasy foods.  Follow up in 6 months.   It was a pleasure to see you today. I want to create trusting relationships with patients. If you receive a survey regarding your visit,  I greatly appreciate you taking time to fill this out on paper or through your MyChart. I value your feedback.  Julian Obey, MSN, FNP-BC, AGACNP-BC [facility]

## 2023-11-22 ENCOUNTER — Other Ambulatory Visit: Payer: Self-pay | Admitting: Family Medicine

## 2023-12-13 ENCOUNTER — Ambulatory Visit (INDEPENDENT_AMBULATORY_CARE_PROVIDER_SITE_OTHER): Payer: Medicare Other | Admitting: Family Medicine

## 2023-12-13 ENCOUNTER — Encounter: Payer: Self-pay | Admitting: Family Medicine

## 2023-12-13 VITALS — BP 138/74 | HR 88 | Temp 98.0°F | Ht 65.0 in | Wt 168.1 lb

## 2023-12-13 DIAGNOSIS — I10 Essential (primary) hypertension: Secondary | ICD-10-CM

## 2023-12-13 DIAGNOSIS — Z1231 Encounter for screening mammogram for malignant neoplasm of breast: Secondary | ICD-10-CM | POA: Diagnosis not present

## 2023-12-13 DIAGNOSIS — E785 Hyperlipidemia, unspecified: Secondary | ICD-10-CM | POA: Diagnosis not present

## 2023-12-13 DIAGNOSIS — J0100 Acute maxillary sinusitis, unspecified: Secondary | ICD-10-CM | POA: Diagnosis not present

## 2023-12-13 DIAGNOSIS — J320 Chronic maxillary sinusitis: Secondary | ICD-10-CM | POA: Insufficient documentation

## 2023-12-13 DIAGNOSIS — F17200 Nicotine dependence, unspecified, uncomplicated: Secondary | ICD-10-CM | POA: Diagnosis not present

## 2023-12-13 DIAGNOSIS — R8290 Unspecified abnormal findings in urine: Secondary | ICD-10-CM | POA: Diagnosis not present

## 2023-12-13 DIAGNOSIS — J42 Unspecified chronic bronchitis: Secondary | ICD-10-CM | POA: Diagnosis not present

## 2023-12-13 MED ORDER — AZITHROMYCIN 250 MG PO TABS
ORAL_TABLET | ORAL | 0 refills | Status: AC
Start: 2023-12-13 — End: 2023-12-18

## 2023-12-13 NOTE — Progress Notes (Unsigned)
   [name]     MRN: [id number]      DOB: January 19, 1941  Chief Complaint  Patient presents with   Follow-up    HPI [name] is here for follow up and re-evaluation of chronic medical conditions, medication management and review of any available recent lab and radiology data.  Preventive health is updated, specifically  Cancer screening and Immunization.   Questions or concerns regarding consultations or procedures which the PT has had in the interim are  addressed. The PT denies any adverse reactions to current medications since the last visit.  1 month h/o maxillary sinus pressure with thick drainage which is green  ROS Denies recent fever or chills. Denies sinus pressure, nasal congestion, ear pain or sore throat. Denies chest congestion, productive cough or wheezing. Denies chest pains, palpitations and leg swelling Denies abdominal pain, nausea, vomiting,diarrhea or constipation.   Denies dysuria, frequency, hesitancy or incontinence. Denies joint pain, swelling and limitation in mobility. Denies headaches, seizures, numbness, or tingling. Denies depression, anxiety or insomnia. Denies skin break down or rash.   PE  BP 138/74   Pulse 88   Temp 98 F (36.7 C)   Ht 5\' 5"  (1.651 m)   Wt 168 lb 1.9 oz (76.3 kg)   SpO2 92%   BMI 27.98 kg/m   Patient alert and oriented and in no cardiopulmonary distress.  HEENT: No facial asymmetry, EOMI,     Neck supple .  Chest: Clear to auscultation bilaterally.  CVS: S1, S2 no murmurs, no S3.Regular rate.  ABD: Soft non tender.   Ext: No edema  MS: Adequate ROM spine, shoulders, hips and knees.  Skin: Intact, no ulcerations or rash noted.  Psych: Good eye contact, normal affect. Memory intact not anxious or depressed appearing.  CNS: CN 2-12 intact, power,  normal throughout.no focal deficits noted.   Assessment & Plan  No problem-specific Assessment & Plan notes found for this encounter.

## 2023-12-13 NOTE — Patient Instructions (Addendum)
 Annual exam with MD in 4 months, call if you need me sooner  Please get TdAP  at pharmacy  Labs today already ordered  Urine for testing for infection today  Keep doing the good you are doing  Stop smoking altogether . Keep working on it  Thanks for [employer], we consider it a privelige to serve you.

## 2023-12-13 NOTE — Assessment & Plan Note (Signed)
 Z pack prescribed

## 2023-12-13 NOTE — Assessment & Plan Note (Signed)
 Urine Ccua and c/s

## 2023-12-14 LAB — LIPID PANEL
Chol/HDL Ratio: 3.4 ratio (ref 0.0–4.4)
Cholesterol, Total: 141 mg/dL (ref 100–199)
HDL: 41 mg/dL (ref >39)
LDL Chol Calc (NIH): 78 mg/dL (ref 0–99)
Triglycerides: 122 mg/dL (ref 0–149)
VLDL Cholesterol Cal: 22 mg/dL (ref 5–40)

## 2023-12-14 LAB — CMP14+EGFR
ALT: 5 IU/L (ref 0–32)
AST: 14 IU/L (ref 0–40)
Albumin: 4.2 g/dL (ref 3.7–4.7)
Alkaline Phosphatase: 115 IU/L (ref 44–121)
BUN/Creatinine Ratio: 7 — ABNORMAL LOW (ref 12–28)
BUN: 11 mg/dL (ref 8–27)
Bilirubin Total: 0.7 mg/dL (ref 0.0–1.2)
CO2: 22 mmol/L (ref 20–29)
Calcium: 9.2 mg/dL (ref 8.7–10.3)
Chloride: 103 mmol/L (ref 96–106)
Creatinine, Ser: 1.55 mg/dL — ABNORMAL HIGH (ref 0.57–1.00)
Globulin, Total: 3.1 g/dL (ref 1.5–4.5)
Glucose: 95 mg/dL (ref 70–99)
Potassium: 4.3 mmol/L (ref 3.5–5.2)
Sodium: 142 mmol/L (ref 134–144)
Total Protein: 7.3 g/dL (ref 6.0–8.5)
eGFR: 33 mL/min/{1.73_m2} — ABNORMAL LOW (ref >59)

## 2023-12-15 ENCOUNTER — Ambulatory Visit: Payer: Self-pay | Admitting: Family Medicine

## 2023-12-15 DIAGNOSIS — Z1231 Encounter for screening mammogram for malignant neoplasm of breast: Secondary | ICD-10-CM | POA: Insufficient documentation

## 2023-12-15 NOTE — Assessment & Plan Note (Signed)
 Current or recent pain flare

## 2023-12-15 NOTE — Assessment & Plan Note (Signed)
Worsening due to ongoing nicotine use  

## 2023-12-15 NOTE — Assessment & Plan Note (Signed)
 Hyperlipidemia:Low fat diet discussed and encouraged.   Lipid Panel  Lab Results  Component Value Date   CHOL 141 12/13/2023   HDL 41 12/13/2023   LDLCALC 78 12/13/2023   TRIG 122 12/13/2023   CHOLHDL 3.4 12/13/2023     Controlled, no change in medication

## 2023-12-15 NOTE — Assessment & Plan Note (Signed)
 Controlled, no change in medication DASH diet and commitment to daily physical activity for a minimum of 30 minutes discussed and encouraged, as a part of hypertension management. The importance of attaining a healthy weight is also discussed.     12/13/2023    1:06 PM 11/12/2023    2:02 PM 11/12/2023    1:56 PM 08/09/2023    1:27 PM 08/09/2023    1:24 PM 04/03/2023   10:34 AM 04/01/2023    9:39 AM  BP/Weight  Systolic BP 138 133 149 153 153 135 135  Diastolic BP 74 70 77 82 78 72 72  Wt. (Lbs) 168.12  170.4  173 174 174.8  BMI 27.98 kg/m2  28.36 kg/m2  28.79 kg/m2 28.96 kg/m2 29.09 kg/m2

## 2023-12-15 NOTE — Assessment & Plan Note (Signed)
Asked:confirms currently smokes cigarettes °Assess: Unwilling to set a quit date, but is cutting back °Advise: needs to QUIT to reduce risk of cancer, cardio and cerebrovascular disease °Assist: counseled for 5 minutes and literature provided °Arrange: follow up in 2 to 4 months ° °

## 2023-12-16 ENCOUNTER — Ambulatory Visit: Payer: Self-pay | Admitting: Family Medicine

## 2023-12-16 LAB — UA/M W/RFLX CULTURE, ROUTINE
Bilirubin, UA: NEGATIVE
Glucose, UA: NEGATIVE
Ketones, UA: NEGATIVE
Nitrite, UA: NEGATIVE
Specific Gravity, UA: 1.016 (ref 1.005–1.030)
Urobilinogen, Ur: 0.2 mg/dL (ref 0.2–1.0)
pH, UA: 6 (ref 5.0–7.5)

## 2023-12-16 LAB — MICROSCOPIC EXAMINATION
Casts: NONE SEEN /LPF
Epithelial Cells (non renal): >10 /HPF — AB (ref 0–10)
WBC, UA: >30 /HPF — AB (ref 0–5)

## 2023-12-16 LAB — URINE CULTURE, REFLEX

## 2024-01-11 NOTE — Progress Notes (Signed)
 Cardiology Office Note   Date:  01/17/2024   ID:  [name], DOB 1940-12-18, MRN [id number]  PCP:  Antonetta Rollene BRAVO, MD  Cardiologist:   Vina Gull, MD   Previous Charls   Pt presents for cardiac care   Last seen in cilinic in 2015    History of Present Illness: [name] is a 82 y.o. female   with hx of CAD    11  Myoview showed anterior ischemia   Cardiac cath in Sept 2015 showe: 50 to 70% D1; 40 to 50% RCA. Nothinig appeared flow limiting    Echo showed LVEF 55 to 60% with mod LVH    I saw the pt in 2022  She was seen by S Hammock in 2024 Since seen she denies CP  Breathing is good   No palpitations   No dizziness     She is active   Has exercise bike    Breakfast  Carnation beakfast and banana Lunch   salad and fruit  [place] Dinner:   Whatever Veggies  Beans Current Meds  Medication Sig   amLODipine  (NORVASC ) 5 MG tablet TAKE 1 TABLET(5 MG) BY MOUTH DAILY   aspirin  EC 81 MG tablet Take 81 mg by mouth daily. Swallow whole.   carvedilol  (COREG ) 3.125 MG tablet TAKE 1 TABLET(3.125 MG) BY MOUTH TWICE DAILY WITH A MEAL   cetirizine  (ZYRTEC ) 5 MG tablet Take one tablet by mouth two times daily as needed , for uncontrolled allergy symptoms   Cholecalciferol (VITAMIN D -3) 125 MCG (5000 UT) TABS Take by mouth.   ezetimibe  (ZETIA ) 10 MG tablet Take 1 tablet (10 mg total) by mouth daily.   fluticasone  (FLONASE ) 50 MCG/ACT nasal spray Place 2 sprays into both nostrils daily as needed for allergies.   magnesium  hydroxide (MILK OF MAGNESIA) 400 MG/5ML suspension Take 30 mLs by mouth daily as needed for mild constipation or moderate constipation.   Multiple Vitamin (MULTIVITAMIN) tablet Take 1 tablet by mouth daily.   olmesartan  (BENICAR ) 20 MG tablet TAKE 1 TABLET BY MOUTH EVERY NIGHT AT 10 PM FOR BLOOD PRESSURE   oxymetazoline (VICKS SINEX 12 HOUR DECONGEST) 0.05 % nasal spray Place 1 spray into both nostrils 2 (two) times daily as needed for congestion.    pantoprazole  (PROTONIX ) 40 MG tablet TAKE 1 TABLET(40 MG) BY MOUTH DAILY BEFORE BREAKFAST   rosuvastatin  (CRESTOR ) 40 MG tablet Take 1 tablet (40 mg total) by mouth daily.     Allergies:   Patient has no known allergies.   Past Medical History:  Diagnosis Date   COPD (chronic obstructive pulmonary disease) (HCC)    Hiatal hernia without gangrene and obstruction 03/23/2015   Hypertension 2004   Lung nodule seen on imaging study    Nicotine  addiction    OA (osteoarthritis) of knee    Obesity    Sinusitis     Past Surgical History:  Procedure Laterality Date   bilateral cataract surgery  7/27 & 03/01/09   Dr. Roz     cholecystectomy     COLONOSCOPY  04/2004   Dr. Keven Smith-->melanosis coli   COLONOSCOPY WITH ESOPHAGOGASTRODUODENOSCOPY (EGD) N/A 02/25/2013   Dr. Fields:Normal mucosa in the terminal ileum/Severe melanosis throughout the entire examined colon/ONE COLON POLYP REMOVED (tubular adenoma)/Mild diverticulosis  in the sigmoid colon/EGD:The mucosa of the esophagus appeared normal/Non-erosive gastritis, empiric dilation with Savary dilator   COLONOSCOPY WITH PROPOFOL  N/A 01/29/2023   Procedure: COLONOSCOPY WITH PROPOFOL ;  Surgeon: Cindie Carlin POUR, DO;  [facility];  Service: Endoscopy;  Laterality: N/A;  930am, asa 2   ESOPHAGOGASTRODUODENOSCOPY  11/07/2010   Jenkins:hiatal hernia/no evidence of Barrett esophagitis.  The Z-line was noted to be at 39 cm from the teeth. CLO test negative   ESOPHAGOGASTRODUODENOSCOPY N/A 09/11/2013   Dr.Fields-  incomplete esophageal web in mid-esophagus, medium sized hialtal hernia, PUD. bx=focal erosion with inflammation and fibrosis   ESOPHAGOGASTRODUODENOSCOPY N/A 07/06/2014   healed ulcers, persistent erosions on aspirin , small hiatal hernia   ESOPHAGOGASTRODUODENOSCOPY N/A 02/05/2020   normal esophagus s/p dilation, normal stomach, normal duodenal bulb.    knee arthroscopy right     LEFT HEART CATHETERIZATION WITH CORONARY  ANGIOGRAM N/A 04/08/2014   Procedure: LEFT HEART CATHETERIZATION WITH CORONARY ANGIOGRAM;  Surgeon: Victory LELON Claudene DOUGLAS, MD;  Location: [facility];  Service: Cardiovascular;  Laterality: N/A;   MALONEY DILATION N/A 02/25/2013   Procedure: AGAPITO DILATION;  Surgeon: Margo LITTIE Haddock, MD;  Location: [facility];  Service: Endoscopy;  Laterality: N/A;   PARTIAL HYSTERECTOMY     POLYPECTOMY  01/29/2023   Procedure: POLYPECTOMY;  Surgeon: Cindie Carlin POUR, DO;  Location: [facility];  Service: Endoscopy;;   SAVORY DILATION N/A 02/25/2013   Procedure: SAVORY DILATION;  Surgeon: Margo LITTIE Haddock, MD;  Location: [facility];  Service: Endoscopy;  Laterality: N/A;   total knee arthroplasty right  05/29/2005   Dr. Margrette      Social History:  The patient  reports that she has been smoking cigarettes. She started smoking about 61 years ago. She has a 6.1 pack-year smoking history. She has never used smokeless tobacco. She reports that she does not drink alcohol and does not use drugs.   Family History:  The patient's family history includes Coronary artery disease in her mother; Diabetes in her brother and mother; Hypertension in her brother, brother, mother, sister, and sister; Pneumonia in her father.    ROS:  Please see the history of present illness. All other systems are reviewed and  Negative to the above problem except as noted.    PHYSICAL EXAM: VS:  BP 130/74 (BP Location: Left Arm)   Pulse 83   Ht 5' 5 (1.651 m)   Wt 169 lb 12.8 oz (77 kg)   SpO2 96%   BMI 28.26 kg/m   GEN: Well nourished, well developed, in no acute distress  HEENT: normal  Neck: no JVD, carotid bruits Cardiac: RRR; no murmurs.  NO LE edema  Respiratory:  clear to auscultation GI: soft, nontender, nondistended, + BS  No hepatomegaly  MS: no deformity Moving all extremities      EKG:  EKG shows NSR 83 bpm    Lipid Panel    Component Value Date/Time   CHOL 141 12/13/2023 1355   TRIG 122 12/13/2023 1355    HDL 41 12/13/2023 1355   CHOLHDL 3.4 12/13/2023 1355   CHOLHDL 4.1 07/04/2016 1204   VLDL 23 07/04/2016 1204   LDLCALC 78 12/13/2023 1355      Wt Readings from Last 3 Encounters:  01/17/24 169 lb 12.8 oz (77 kg)  12/13/23 168 lb 1.9 oz (76.3 kg)  11/12/23 170 lb 6.4 oz (77.3 kg)      ASSESSMENT AND PLAN:  1  CAD   PT with cath in 2015 that showed moderate CAD  SHe denies anginal symptoms    Keep on same meds    2HTN   BP is controlled   Keep on same regimen  3  HL   Lipids still need tighter control   WIll add Zetia   Keep  Crestor     Follow up lipomed and liver panel in 8 wks    5  Diet   Reviewed  again  Cut back on carbs  Recom 24 g Sugar; limit processed food   F/U next spring    Current medicines are reviewed at length with the patient today.  The patient does not have concerns regarding medicines.  Signed, Vina Gull, MD  01/17/2024 9:16 PM    [facility] [address] [place], [place], [place]  [address] Phone: [phone]; Fax: [phone]

## 2024-01-17 ENCOUNTER — Ambulatory Visit: Attending: Internal Medicine | Admitting: Internal Medicine

## 2024-01-17 ENCOUNTER — Encounter: Payer: Self-pay | Admitting: Internal Medicine

## 2024-01-17 VITALS — BP 130/74 | HR 83 | Ht 65.0 in | Wt 169.8 lb

## 2024-01-17 DIAGNOSIS — I2510 Atherosclerotic heart disease of native coronary artery without angina pectoris: Secondary | ICD-10-CM

## 2024-01-17 DIAGNOSIS — E785 Hyperlipidemia, unspecified: Secondary | ICD-10-CM

## 2024-01-17 MED ORDER — EZETIMIBE 10 MG PO TABS: 10.0000 mg | ORAL_TABLET | Freq: Every day | ORAL | 3 refills | Status: DC

## 2024-01-17 NOTE — Patient Instructions (Signed)
 Medication Instructions:   START Zetia  10 mg daily  Labwork: Fasting Lipid, LFT's in 8 weeks    Testing/Procedures: none  Follow-Up: May 2026 Dr.Ross  Any Other Special Instructions Will Be Listed Below (If Applicable).  If you need a refill on your cardiac medications before your next appointment, please call your pharmacy.

## 2024-02-28 ENCOUNTER — Telehealth: Payer: Self-pay | Admitting: Family Medicine

## 2024-02-28 NOTE — Telephone Encounter (Signed)
 Copied from CRM [id number]. Topic: Referral - Question >> Feb 28, 2024  2:12 PM Nathanel BROCKS wrote: Reason for CRM: Pt went to the cardiologist for a yearly visit. Dr Okey. Pt got a bill for over 400.00 and the ins stated that would not pay because they did not get a prior authorization. Was a prior authorization gotten? Please advise. Pt stated that she could not pay this bill.

## 2024-03-03 ENCOUNTER — Telehealth: Payer: Self-pay

## 2024-03-03 NOTE — Telephone Encounter (Signed)
**Note De-identified  [place] Obfuscation** Please advise 

## 2024-03-03 NOTE — Progress Notes (Signed)
   03/03/2024  Patient ID: [name], female   DOB: [birth year], 83 y.o.   MRN: [id number]  This patient is appearing on a report for being at risk of failing the adherence measure for identified medications this calendar year.   Medication Adherence Summary (STAR/HEDIS Monitoring): Adherence Category: hypertension (ACEi/ARB)    Drug Name: Olmesartan  20 mg  Last Fill or Sold Date:11/03/2023 Days' Supply: 90      Notes: ? Adherence data pulled from pharmacy claims portal Dr. Annemarie. I called patient, she states that she is taking her medication. She reports that she has a stockpile already when the pharmacy automatically refills medications if a new prescription is sent to the pharmacy or medication changes. Denies missing doses. She states that she calls the pharmacy to refill her medications one week before she runs outs.  ? Patient education provided on importance of adherence for chronic condition management and STAR quality performance. ? Reviewed barriers to adherence: reports a stockpile. ? Plan: Follow up in afew months for refill   Dorcas Solian, PharmD Clinical Pharmacist Cell: [phone]

## 2024-03-09 NOTE — Telephone Encounter (Signed)
 We did not place a Referral for this Patient to Cardiology.  If the Patient did not have a Referral then that may be the reason she has a bill.  But if procedures were done in the Cardiology Visit the responsibility of Auth for those procedures would fall on the Cardiology Office.

## 2024-03-10 NOTE — Telephone Encounter (Signed)
 Patient informed.

## 2024-03-18 ENCOUNTER — Ambulatory Visit (INDEPENDENT_AMBULATORY_CARE_PROVIDER_SITE_OTHER): Admitting: Family Medicine

## 2024-03-18 ENCOUNTER — Encounter: Payer: Self-pay | Admitting: Family Medicine

## 2024-03-18 VITALS — BP 138/84 | HR 100 | Resp 18 | Ht 65.0 in | Wt 167.0 lb

## 2024-03-18 DIAGNOSIS — I10 Essential (primary) hypertension: Secondary | ICD-10-CM

## 2024-03-18 DIAGNOSIS — Z0001 Encounter for general adult medical examination with abnormal findings: Secondary | ICD-10-CM

## 2024-03-18 DIAGNOSIS — R7303 Prediabetes: Secondary | ICD-10-CM

## 2024-03-18 DIAGNOSIS — N1832 Chronic kidney disease, stage 3b: Secondary | ICD-10-CM

## 2024-03-18 DIAGNOSIS — E785 Hyperlipidemia, unspecified: Secondary | ICD-10-CM

## 2024-03-18 DIAGNOSIS — E559 Vitamin D deficiency, unspecified: Secondary | ICD-10-CM | POA: Diagnosis not present

## 2024-03-18 NOTE — Assessment & Plan Note (Signed)
 Annual exam as documented. Counseling done  re healthy lifestyle involving commitment to 150 minutes exercise per week, heart healthy diet, and attaining healthy weight.The importance of adequate sleep also discussed.  Immunization and cancer screening needs are specifically addressed at this visit.

## 2024-03-18 NOTE — Patient Instructions (Addendum)
 F/U in 6 months   Pals sched flu vaccine at checkout  CBC, TSH, Vit D, HBA1C, bmp and EGFR today  NEED at pharmacy TdAP and covid vaccine   No changes in medication at this time   It is important that you exercise regularly at least 30 minutes 5 times a week. If you develop chest pain, have severe difficulty breathing, or feel very tired, stop exercising immediately and seek medical attention    Think about what you will eat, plan ahead. Choose  clean, green, fresh or frozen over canned, processed or packaged foods which are more sugary, salty and fatty. 70 to 75% of food eaten should be vegetables and fruit. Three meals at set times with snacks allowed between meals, but they must be fruit or vegetables. Aim to eat over a 12 hour period , example 7 am to 7 pm, and STOP after  your last meal of the day. Drink water ,generally about 64 ounces per day, no other drink is as healthy. Fruit juice is best enjoyed in a healthy way, by EATING the fruit.   Thanks for choosing [facility], we consider it a privelige to serve you.

## 2024-03-18 NOTE — Progress Notes (Signed)
    [name]     MRN: [id number]      DOB: [birth year]  Chief Complaint  Patient presents with   Annual Exam    Cpe     HPI: Patient is in for annual physical exam. No other health concerns are expressed or addressed at the visit. Recent labs,  are reviewed. Immunization is reviewed    PE: BP 138/84   Pulse 100   Resp 18   Ht 5' 5 (1.651 m)   Wt 167 lb (75.8 kg)   SpO2 95%   BMI 27.79 kg/m   Pleasant  female, alert and oriented x 3, in no cardio-pulmonary distress. Afebrile. HEENT No facial trauma or asymetry. Sinuses non tender.  Extra occullar muscles intact.. External ears normal, . Neck: supple, no adenopathy,JVD or thyromegaly.No bruits.  Chest: Clear to ascultation bilaterally.No crackles or wheezes. Non tender to palpation   Cardiovascular system; Heart sounds normal,  S1 and  S2 ,no S3.  No murmur, or thrill. Apical beat not displaced Peripheral pulses normal.  Abdomen: Soft, non tender, no organomegaly or masses. No bruits. Bowel sounds normal. No guarding, tenderness or rebound.    Musculoskeletal exam: Full ROM of spine, hips , shoulders and knees. No deformity ,swelling or crepitus noted. No muscle wasting or atrophy.   Neurologic: Cranial nerves 2 to 12 intact. Power, tone ,sensation and reflexes normal throughout. No disturbance in gait. No tremor.  Skin: Intact, no ulceration, erythema , scaling or rash noted. Pigmentation normal throughout  Psych; Normal mood and affect. Judgement and concentration normal   Assessment & Plan:  Encounter for Medicare annual examination with abnormal findings Annual exam as documented. Counseling done  re healthy lifestyle involving commitment to 150 minutes exercise per week, heart healthy diet, and attaining healthy weight.The importance of adequate sleep also discussed.  Immunization and cancer screening needs are specifically addressed at this visit.

## 2024-03-19 LAB — CBC WITH DIFFERENTIAL/PLATELET
Basophils Absolute: 0.1 x10E3/uL (ref 0.0–0.2)
Basos: 1 %
EOS (ABSOLUTE): 0.4 x10E3/uL (ref 0.0–0.4)
Eos: 5 %
Hematocrit: 39.4 % (ref 34.0–46.6)
Hemoglobin: 12.5 g/dL (ref 11.1–15.9)
Immature Grans (Abs): 0 x10E3/uL (ref 0.0–0.1)
Immature Granulocytes: 0 %
Lymphocytes Absolute: 3.1 x10E3/uL (ref 0.7–3.1)
Lymphs: 43 %
MCH: 28.8 pg (ref 26.6–33.0)
MCHC: 31.7 g/dL (ref 31.5–35.7)
MCV: 91 fL (ref 79–97)
Monocytes Absolute: 0.9 x10E3/uL (ref 0.1–0.9)
Monocytes: 12 %
Neutrophils Absolute: 2.9 x10E3/uL (ref 1.4–7.0)
Neutrophils: 39 %
Platelets: 291 x10E3/uL (ref 150–450)
RBC: 4.34 x10E6/uL (ref 3.77–5.28)
RDW: 13.1 % (ref 11.7–15.4)
WBC: 7.4 x10E3/uL (ref 3.4–10.8)

## 2024-03-19 LAB — BMP8+EGFR
BUN/Creatinine Ratio: 8 — ABNORMAL LOW (ref 12–28)
BUN: 10 mg/dL (ref 8–27)
CO2: 25 mmol/L (ref 20–29)
Calcium: 8.8 mg/dL (ref 8.7–10.3)
Chloride: 100 mmol/L (ref 96–106)
Creatinine, Ser: 1.27 mg/dL — ABNORMAL HIGH (ref 0.57–1.00)
Glucose: 75 mg/dL (ref 70–99)
Potassium: 3.8 mmol/L (ref 3.5–5.2)
Sodium: 142 mmol/L (ref 134–144)
eGFR: 42 mL/min/1.73 — ABNORMAL LOW (ref >59)

## 2024-03-19 LAB — VITAMIN D 25 HYDROXY (VIT D DEFICIENCY, FRACTURES): Vit D, 25-Hydroxy: 23.9 ng/mL — ABNORMAL LOW (ref 30.0–100.0)

## 2024-03-19 LAB — TSH: TSH: 0.753 u[IU]/mL (ref 0.450–4.500)

## 2024-03-19 LAB — HEMOGLOBIN A1C
Est. average glucose Bld gHb Est-mCnc: 117 mg/dL
Hgb A1c MFr Bld: 5.7 % — ABNORMAL HIGH (ref 4.8–5.6)

## 2024-03-20 ENCOUNTER — Ambulatory Visit: Payer: Self-pay | Admitting: Family Medicine

## 2024-04-01 ENCOUNTER — Other Ambulatory Visit: Payer: Self-pay | Admitting: Gastroenterology

## 2024-04-01 ENCOUNTER — Other Ambulatory Visit: Payer: Self-pay | Admitting: Family Medicine

## 2024-04-07 ENCOUNTER — Encounter: Payer: Self-pay | Admitting: Gastroenterology

## 2024-04-07 ENCOUNTER — Ambulatory Visit: Payer: Medicare Other

## 2024-04-07 VITALS — Ht 65.0 in | Wt 167.0 lb

## 2024-04-07 DIAGNOSIS — Z0000 Encounter for general adult medical examination without abnormal findings: Secondary | ICD-10-CM | POA: Diagnosis not present

## 2024-04-07 DIAGNOSIS — Z1231 Encounter for screening mammogram for malignant neoplasm of breast: Secondary | ICD-10-CM

## 2024-04-07 NOTE — Patient Instructions (Signed)
 Ms. Veldhuizen,  Thank you for taking the time for your Medicare Wellness Visit. I appreciate your continued commitment to your health goals. Please review the care plan we discussed, and feel free to reach out if I can assist you further.    Ongoing Care Seeing your primary care provider every 3 to 6 months helps us  monitor your health and provide consistent, personalized care.     Referrals  If a referral was made during today's visit and you haven't received any updates within two weeks, please contact the referred provider directly to check on the status.  To Schedule Your Mammogram, Call:  [facility] @  [phone]   Wishing you good health and many blessings for the year to come  -Arvada Seaborn    Medicare recommends these wellness visits once per year to help you and your care team stay ahead of potential health issues. These visits are designed to focus on prevention, allowing your provider to concentrate on managing your acute and chronic conditions during your regular appointments.     Please note that Annual Wellness Visits do not include a physical exam. Some assessments may be limited, especially if the visit was conducted virtually. If needed, we may recommend a separate in-person follow-up with your provider.  Recommended Screenings:  Health Maintenance  Topic Date Due   DTaP/Tdap/Td vaccine (3 - Tdap) 03/18/2019   Flu Shot  02/21/2024   COVID-19 Vaccine (7 - 2024-25 season) 03/23/2024   Breast Cancer Screening  04/23/2024   Medicare Annual Wellness Visit  04/07/2025   DEXA scan (bone density measurement)  08/26/2028   Pneumococcal Vaccine for age over 71  Completed   Zoster (Shingles) Vaccine  Completed   HPV Vaccine  Aged Out   Meningitis B Vaccine  Aged Out   Hepatitis C Screening  Discontinued       04/07/2024    8:59 AM  Advanced Directives  Does Patient Have a Medical Advance Directive? No  Would patient like information on creating a medical advance  directive? No - Patient declined    Advance Care Planning is important because it: Ensures you receive medical care that aligns with your values, goals, and preferences. Provides guidance to your family and loved ones, reducing the emotional burden of decision-making during critical moments.    Vision: Annual vision screenings are recommended for early detection of glaucoma, cataracts, and diabetic retinopathy. These exams can also reveal signs of chronic conditions such as diabetes and high blood pressure.    Dental: Annual dental screenings help detect early signs of oral cancer, gum disease, and other conditions linked to overall health, including heart disease and diabetes.  Please see the attached documents for additional preventive care recommendations.

## 2024-04-07 NOTE — Progress Notes (Signed)
 Please attest and cosign this visit due to patients primary care provider not being immediately available at the time the visit was completed.   Subjective:   [name] is a 83 y.o. who presents for a Medicare Wellness preventive visit. As a reminder, Annual Wellness Visits don't include a physical exam, and some assessments may be limited, especially if this visit is performed virtually. We may recommend an in-person follow-up visit with your provider if needed.  Visit Complete: Virtual I connected with  [name] on 04/07/24 by a audio enabled telemedicine application and verified that I am speaking with the correct person using two identifiers.  Patient Location: Home  Provider Location: Home Office  I discussed the limitations of evaluation and management by telemedicine. The patient expressed understanding and agreed to proceed.  Vital Signs: Because this visit was a virtual/telehealth visit, some criteria may be missing or patient reported. Any vitals not documented were not able to be obtained and vitals that have been documented are patient reported. VideoError- Librarian, academic were attempted between this provider and patient, however failed, due to patient having technical difficulties OR patient did not have access to video capability.  We continued and completed visit with audio only.   Persons Participating in Visit: Patient.  AWV Questionnaire: No: Patient Medicare AWV questionnaire was not completed prior to this visit. Cardiac Risk Factors include: advanced age (>71men, >59 women);sedentary lifestyle;smoking/ tobacco exposure;hypertension;Other (see comment), Risk factor comments: COPD     Objective:    Today's Vitals   04/07/24 0905  Weight: 167 lb (75.8 kg)  Height: 5' 5 (1.651 m)   Body mass index is 27.79 kg/m.    04/07/2024    8:59 AM 04/03/2023   10:33 AM 01/29/2023   11:37 AM 08/24/2021   11:59 AM 03/07/2021    3:42 PM  03/01/2020    8:21 AM 02/05/2020   10:23 AM  Advanced Directives  Does Patient Have a Medical Advance Directive? No No Yes Yes Yes Yes Yes  Type of Advance Directive   Living will Healthcare Power of [place];Living will Living will;Healthcare Power of [employer] Power of Attorney Living will  Does patient want to make changes to medical advance directive?      No - Patient declined   Copy of Healthcare Power of Attorney in Chart?      No - copy requested   Would patient like information on creating a medical advance directive? No - Patient declined No - Patient declined         Current Medications (verified) Outpatient Encounter Medications as of 04/07/2024  Medication Sig   amLODipine  (NORVASC ) 5 MG tablet TAKE 1 TABLET(5 MG) BY MOUTH DAILY   aspirin  EC 81 MG tablet Take 81 mg by mouth daily. Swallow whole.   carvedilol  (COREG ) 3.125 MG tablet TAKE 1 TABLET(3.125 MG) BY MOUTH TWICE DAILY WITH A MEAL   cetirizine  (ZYRTEC ) 5 MG tablet Take one tablet by mouth two times daily as needed , for uncontrolled allergy symptoms   Cholecalciferol (VITAMIN D -3) 125 MCG (5000 UT) TABS Take by mouth.   ezetimibe  (ZETIA ) 10 MG tablet Take 1 tablet (10 mg total) by mouth daily.   fluticasone  (FLONASE ) 50 MCG/ACT nasal spray Place 2 sprays into both nostrils daily as needed for allergies.   magnesium  hydroxide (MILK OF MAGNESIA) 400 MG/5ML suspension Take 30 mLs by mouth daily as needed for mild constipation or moderate constipation.   Multiple Vitamin (MULTIVITAMIN)  tablet Take 1 tablet by mouth daily.   olmesartan  (BENICAR ) 20 MG tablet TAKE 1 TABLET BY MOUTH EVERY NIGHT AT 10 PM FOR BLOOD PRESSURE   oxymetazoline (VICKS SINEX 12 HOUR DECONGEST) 0.05 % nasal spray Place 1 spray into both nostrils 2 (two) times daily as needed for congestion.   pantoprazole  (PROTONIX ) 40 MG tablet TAKE 1 TABLET(40 MG) BY MOUTH DAILY BEFORE BREAKFAST   rosuvastatin  (CRESTOR ) 40 MG tablet Take 1 tablet (40 mg total)  by mouth daily.   No facility-administered encounter medications on file as of 04/07/2024.    Allergies (verified) Patient has no known allergies.   History: Past Medical History:  Diagnosis Date   COPD (chronic obstructive pulmonary disease) (HCC)    Hiatal hernia without gangrene and obstruction 03/23/2015   Hypertension 2004   Lung nodule seen on imaging study    Nicotine  addiction    OA (osteoarthritis) of knee    Obesity    Sinusitis    Past Surgical History:  Procedure Laterality Date   bilateral cataract surgery  7/27 & 03/01/09   Dr. Roz     cholecystectomy     COLONOSCOPY  04/2004   Dr. Keven Smith-->melanosis coli   COLONOSCOPY WITH ESOPHAGOGASTRODUODENOSCOPY (EGD) N/A 02/25/2013   Dr. Fields:Normal mucosa in the terminal ileum/Severe melanosis throughout the entire examined colon/ONE COLON POLYP REMOVED (tubular adenoma)/Mild diverticulosis  in the sigmoid colon/EGD:The mucosa of the esophagus appeared normal/Non-erosive gastritis, empiric dilation with Savary dilator   COLONOSCOPY WITH PROPOFOL  N/A 01/29/2023   Procedure: COLONOSCOPY WITH PROPOFOL ;  Surgeon: Cindie Carlin POUR, DO;  [facility];  Service: Endoscopy;  Laterality: N/A;  930am, asa 2   ESOPHAGOGASTRODUODENOSCOPY  11/07/2010   Jenkins:hiatal hernia/no evidence of Barrett esophagitis.  The Z-line was noted to be at 39 cm from the teeth. CLO test negative   ESOPHAGOGASTRODUODENOSCOPY N/A 09/11/2013   Dr.Fields-  incomplete esophageal web in mid-esophagus, medium sized hialtal hernia, PUD. bx=focal erosion with inflammation and fibrosis   ESOPHAGOGASTRODUODENOSCOPY N/A 07/06/2014   healed ulcers, persistent erosions on aspirin , small hiatal hernia   ESOPHAGOGASTRODUODENOSCOPY N/A 02/05/2020   normal esophagus s/p dilation, normal stomach, normal duodenal bulb.    knee arthroscopy right     LEFT HEART CATHETERIZATION WITH CORONARY ANGIOGRAM N/A 04/08/2014   Procedure: LEFT HEART CATHETERIZATION WITH  CORONARY ANGIOGRAM;  Surgeon: Victory LELON Claudene DOUGLAS, MD;  Location: [facility] CATH LAB;  Service: Cardiovascular;  Laterality: N/A;   MALONEY DILATION N/A 02/25/2013   Procedure: AGAPITO DILATION;  Surgeon: Margo LITTIE Haddock, MD;  Location: [facility];  Service: Endoscopy;  Laterality: N/A;   PARTIAL HYSTERECTOMY     POLYPECTOMY  01/29/2023   Procedure: POLYPECTOMY;  Surgeon: Cindie Carlin POUR, DO;  Location: [facility];  Service: Endoscopy;;   SAVORY DILATION N/A 02/25/2013   Procedure: SAVORY DILATION;  Surgeon: Margo LITTIE Haddock, MD;  Location: [facility];  Service: Endoscopy;  Laterality: N/A;   total knee arthroplasty right  05/29/2005   Dr. Margrette    Family History  Problem Relation Age of Onset   Diabetes Mother    Hypertension Mother    Coronary artery disease Mother    Pneumonia Father    Hypertension Sister    Hypertension Sister    Hypertension Brother        Visual merchandiser   Diabetes Brother    Hypertension Brother    Colon cancer Neg Hx    Liver disease Neg Hx    Social History  Socioeconomic History   Marital status: Widowed    Spouse name: Not on file   Number of children: 4   Years of education: Not on file   Highest education level: Not on file  Occupational History   Occupation: employed     Employer: RETIRED  Tobacco Use   Smoking status: Some Days    Current packs/day: 0.10    Average packs/day: 0.1 packs/day for 61.7 years (6.2 ttl pk-yrs)    Types: Cigarettes    Start date: 07/23/1962   Smokeless tobacco: Never   Tobacco comments:    1 per day   Vaping Use   Vaping status: Never Used  Substance and Sexual Activity   Alcohol use: No    Alcohol/week: 0.0 standard drinks of alcohol   Drug use: No   Sexual activity: Not Currently  Other Topics Concern   Not on file  Social History Narrative   Not on file   Social Drivers of Health   Financial Resource Strain: Low Risk  (04/07/2024)   Overall Financial Resource Strain (CARDIA)    Difficulty of Paying Living  Expenses: Not hard at all  Food Insecurity: No Food Insecurity (04/07/2024)   Hunger Vital Sign    Worried About Running Out of Food in the Last Year: Never true    Ran Out of Food in the Last Year: Never true  Transportation Needs: No Transportation Needs (04/07/2024)   PRAPARE - Administrator, Civil Service (Medical): No    Lack of Transportation (Non-Medical): No  Physical Activity: Inactive (04/07/2024)   Exercise Vital Sign    Days of Exercise per Week: 0 days    Minutes of Exercise per Session: 0 min  Stress: No Stress Concern Present (04/07/2024)   [employer] of Occupational Health - Occupational Stress Questionnaire    Feeling of Stress: Not at all  Social Connections: Moderately Integrated (04/07/2024)   Social Connection and Isolation Panel    Frequency of Communication with Friends and Family: More than three times a week    Frequency of Social Gatherings with Friends and Family: More than three times a week    Attends Religious Services: More than 4 times per year    Active Member of [employer] or Organizations: Yes    Attends Banker Meetings: More than 4 times per year    Marital Status: Widowed    Tobacco Counseling Ready to quit: Yes Counseling given: Yes Tobacco comments: 1 per day    Clinical Intake: Pre-visit preparation completed: Yes Pain : No/denies pain   BMI - recorded: 27.79 Nutritional Status: BMI 25 -29 Overweight Nutritional Risks: None Diabetes: No Lab Results  Component Value Date   HGBA1C 5.7 (H) 03/18/2024   HGBA1C 5.7 (H) 08/09/2023   HGBA1C 5.8 (H) 01/03/2023    How often do you need to have someone help you when you read instructions, pamphlets, or other written materials from your doctor or pharmacy?: 1 - Never Interpreter Needed?: No Information entered by :: Jessicah Croll W CMA (AAMA)  Activities of Daily Living     04/07/2024    1:23 PM  In your present state of health, do you have any difficulty performing the  following activities:  Hearing? 0  Vision? 0  Difficulty concentrating or making decisions? 0  Walking or climbing stairs? 0  Dressing or bathing? 0  Doing errands, shopping? 0  Preparing Food and eating ? N  Using the Toilet? N  In the past six  months, have you accidently leaked urine? N  Do you have problems with loss of bowel control? N  Managing your Medications? N  Managing your Finances? N  Housekeeping or managing your Housekeeping? N   Patient Care Team: Antonetta Rollene BRAVO, MD as PCP - General Carver, Carlin POUR, DO as Consulting Physician (Gastroenterology) Kennedy Charmaine CROME, NP as Nurse Practitioner (Gastroenterology) Okey Vina GAILS, MD as Consulting Physician (Cardiology)   I have updated your Care Teams any recent Medical Services you may have received from other providers in the past year.     Assessment:   This is a routine wellness examination for [facility]  Hearing/Vision screen Hearing Screening - Comments:: Patient denies any hearing difficulties.   Vision Screening - Comments:: Wears rx glasses - up to date with routine eye exams with  Oneil Kawasaki w/ [facility] [place] location  Goals Addressed             This Visit's Progress    Increase physical activity   On track    Try walking at least 3 days per week for 20-30 mins at a time      Quit smoking / using tobacco   On track       Depression Screen     04/07/2024    1:24 PM 03/18/2024    1:22 PM 12/13/2023    1:07 PM 08/09/2023    1:27 PM 04/03/2023   10:54 AM 03/13/2023    1:20 PM 02/06/2023    1:06 PM  PHQ 2/9 Scores  PHQ - 2 Score 0 0 0 0 0 0 0  PHQ- 9 Score 0  0         Fall Risk     04/07/2024    9:11 AM 03/18/2024    1:16 PM 12/13/2023    1:06 PM 08/09/2023    1:27 PM 04/03/2023   11:02 AM  Fall Risk   Falls in the past year? 0 0 0 0 0  Number falls in past yr: 0 0  0 0  Injury with Fall? 0 0  0 0  Risk for fall due to : No Fall Risks   No Fall Risks No Fall Risks  Follow up  Falls evaluation completed;Education provided;Falls prevention discussed Falls evaluation completed  Falls evaluation completed Falls prevention discussed    MEDICARE RISK AT HOME:  Medicare Risk at Home Any stairs in or around the home?: No If so, are there any without handrails?: No Home free of loose throw rugs in walkways, pet beds, electrical cords, etc?: Yes Adequate lighting in your home to reduce risk of falls?: Yes Life alert?: No Use of a cane, walker or w/c?: No Grab bars in the bathroom?: No Shower chair or bench in shower?: No Elevated toilet seat or a handicapped toilet?: No  TIMED UP AND GO: Was the test performed?  No  Cognitive Function: 6CIT completed        04/07/2024    1:23 PM 04/03/2023   10:54 AM 03/28/2022    1:19 PM 03/07/2021    3:44 PM 03/01/2020    8:23 AM  6CIT Screen  What Year? 0 points 0 points 0 points 0 points 0 points  What month? 0 points 0 points 0 points 0 points 0 points  What time? 0 points 0 points 0 points 0 points 0 points  Count back from 20 0 points 0 points 0 points 0 points 0 points  Months in reverse 0 points  0 points 0 points 0 points 0 points  Repeat phrase 0 points 0 points 0 points 10 points 0 points  Total Score 0 points 0 points 0 points 10 points 0 points    Immunizations Immunization History  Administered Date(s) Administered   Fluad Quad(high Dose 65+) 04/27/2019, 04/14/2020, 04/20/2021, 04/25/2022   H1N1 06/21/2008   INFLUENZA, HIGH DOSE SEASONAL PF 06/04/2018   Influenza Split 05/20/2012   Influenza Whole 05/07/2007, 04/19/2008, 05/09/2010, 03/30/2011   Influenza,inj,Quad PF,6+ Mos 04/29/2013, 06/15/2014, 05/24/2015, 06/11/2016, 05/06/2017   Moderna Covid-19 Fall Seasonal Vaccine 35yrs & older 04/08/2023   Moderna Covid-19 Vaccine Bivalent Booster 66yrs & up 05/24/2022   Moderna SARS-COV2 Booster Vaccination 06/13/2021   PFIZER(Purple Top)SARS-COV-2 Vaccination 10/15/2019, 11/07/2019, 06/02/2020   PPD Test  09/01/2018, 09/01/2018, 04/18/2020   Pneumococcal Conjugate-13 02/16/2014   Pneumococcal Polysaccharide-23 04/19/2008   Td 07/24/1995, 03/17/2009   Zoster Recombinant(Shingrix) 05/24/2022, 11/15/2022    Screening Tests Health Maintenance  Topic Date Due   DTaP/Tdap/Td (3 - Tdap) 03/18/2019   Influenza Vaccine  02/21/2024   COVID-19 Vaccine (7 - 2024-25 season) 03/23/2024   Mammogram  04/23/2024   Medicare Annual Wellness (AWV)  04/07/2025   DEXA SCAN  08/26/2028   Pneumococcal Vaccine: 50+ Years  Completed   Zoster Vaccines- Shingrix  Completed   HPV VACCINES  Aged Out   Meningococcal B Vaccine  Aged Out   Hepatitis C Screening  Discontinued    Health Maintenance Health Maintenance Due  Topic Date Due   DTaP/Tdap/Td (3 - Tdap) 03/18/2019   Influenza Vaccine  02/21/2024   COVID-19 Vaccine (7 - 2024-25 season) 03/23/2024   Mammogram  04/23/2024   Health Maintenance Items Addressed: Mammogram previously ordered by Dr.Simpson. Patient provided with phone number to call and schedule  Additional Screening: Vision Screening: Recommended annual ophthalmology exams for early detection of glaucoma and other disorders of the eye. Would you like a referral to an eye doctor? No    Dental Screening: Recommended annual dental exams for proper oral hygiene  Community Resource Referral / Chronic Care Management: CRR required this visit?  No   CCM required this visit?  No  Plan:   I have personally reviewed and noted the following in the patient's chart:   Medical and social history Use of alcohol, tobacco or illicit drugs  Current medications and supplements including opioid prescriptions. Patient is not currently taking opioid prescriptions. Functional ability and status Nutritional status Physical activity Advanced directives List of other physicians Hospitalizations, surgeries, and ER visits in previous 12 months Vitals Screenings to include cognitive, depression, and  falls Referrals and appointments  In addition, I have reviewed and discussed with patient certain preventive protocols, quality metrics, and best practice recommendations. A written personalized care plan for preventive services as well as general preventive health recommendations were provided to patient.   Genene Kilman, CMA   04/07/2024   After Visit Summary: (Mail) Due to this being a telephonic visit, the after visit summary with patients personalized plan was offered to patient via mail   Notes: Nothing significant to report at this time.

## 2024-04-10 ENCOUNTER — Ambulatory Visit (INDEPENDENT_AMBULATORY_CARE_PROVIDER_SITE_OTHER)

## 2024-04-10 DIAGNOSIS — Z23 Encounter for immunization: Secondary | ICD-10-CM | POA: Diagnosis not present

## 2024-04-17 ENCOUNTER — Encounter: Admitting: Family Medicine

## 2024-04-29 ENCOUNTER — Ambulatory Visit: Admitting: Gastroenterology

## 2024-04-29 ENCOUNTER — Encounter: Payer: Self-pay | Admitting: Gastroenterology

## 2024-04-29 VITALS — BP 137/67 | HR 89 | Temp 97.6°F | Ht 65.0 in | Wt 166.0 lb

## 2024-04-29 DIAGNOSIS — K219 Gastro-esophageal reflux disease without esophagitis: Secondary | ICD-10-CM | POA: Diagnosis not present

## 2024-04-29 DIAGNOSIS — E739 Lactose intolerance, unspecified: Secondary | ICD-10-CM

## 2024-04-29 DIAGNOSIS — R140 Abdominal distension (gaseous): Secondary | ICD-10-CM | POA: Diagnosis not present

## 2024-04-29 DIAGNOSIS — K5900 Constipation, unspecified: Secondary | ICD-10-CM | POA: Diagnosis not present

## 2024-04-29 MED ORDER — ESOMEPRAZOLE MAGNESIUM 40 MG PO CPDR: 40.0000 mg | DELAYED_RELEASE_CAPSULE | Freq: Every day | ORAL | 4 refills | Status: AC

## 2024-04-29 NOTE — Patient Instructions (Addendum)
 I have sent in Nexium (esomeprazole) to the pharmacy for you in place of your pantoprazole .  You can continue to take this either in the morning or in the evening just as you have been with your pantoprazole .  When you are eating any dairy products including drinking any of your milk or using coffee creamer please use at least 1 dose of lactase enzyme (lactaid capsule or chewable tablet).  This may can help with gassiness as well. Any of the options below are fine.     Continue your current dietary intake of fiber and fruits to help you keep yourself regular in regards to constipation.  I will send a Trio smart breath test to the house for you.  There should be a solution within the kit for you and detailed instructions.  If you have any questions, please call us  prior to doing this at home.  We will follow-up in 3 months.  It was a pleasure to see you today. I want to create trusting relationships with patients. If you receive a survey regarding your visit,  I greatly appreciate you taking time to fill this out on paper or through your MyChart. I value your feedback.  Charmaine Melia, MSN, FNP-BC, AGACNP-BC [facility]

## 2024-04-29 NOTE — Progress Notes (Signed)
 GI Office Note    Referring Provider: Antonetta Rollene BRAVO, MD Primary Care Physician:  Antonetta Rollene BRAVO, MD Primary Gastroenterologist: Carlin POUR. Cindie, DO  Date:  04/29/2024  ID:  [name], DOB February 10, 1941, MRN [id number]   Chief Complaint   Chief Complaint  Patient presents with   Follow-up    Follow up. Still having a hurting in middle of stomach    History of Present Illness  [name] is a 84 y.o. female with a history of chronic GERD, hiatal hernia, bloating, COPD, HTN, HLD, and arthritis presenting today with complaint of ongoing epigastric discomfort and bloating.  Colonoscopy in 2014 with 1 small tubular adenoma removed from the sigmoid colon.  Recommended repeat in 10 years.   EGD July 2021: -Normal esophagus s/p dilation -Normal stomach -normal duodenum -Advised to stop pantoprazole  and trial Nexium daily. -If weight loss continued then recommended CTA for evaluation of mesenteric ischemia.   Office visit 01/03/2022.  GERD controlled on pantoprazole  once daily.  Does not need refills.  Also with chronic constipation.  Did note some intermittent abdominal bloating.  Previously given Linzess  samples but she was unable to afford this.  Taking milk of magnesia as needed which controls her symptoms.  No melena or BRBPR.  She was advised to continue milk of magnesia as needed for constipation and PPI once daily for reflux.  Advised to discuss risk versus benefits of repeat colonoscopy at next visit.   OV 12/25/22.  Reported when coughing she feels like something is pressing on her hernia site and then releases and goes down.  Had been coughing up phlegm at night and a yellowish color.  Does not take her allergy medicine regularly.  About a month prior she had taken antibiotics and prednisone  for her cough and phlegm.  Reflux doing well on pantoprazole  and avoiding triggers.  Noted a lot of gas moving from the mid to lower abdomen especially after eating steamed  cabbage, rice, and broccoli.  Had been taking Gas-X as needed.  Drinks ginger ale to help belch and to help with gas relief.  Denied any chest pain or shortness of breath.  Constipation is occasional.  Advised to proceed with colonoscopy, continue PPI.  Start Amitiza  and will trial MiraLAX if no improvement with Amitiza .  Continue milk of mag and Gas-X as needed.   Amitiza  was too expensive.  Provided some samples of Linzess .  Offered patient assistance for Amitiza .   Colonoscopy 01/29/2023: - Non- bleeding internal hemorrhoids.  - Diverticulosis in the sigmoid colon. - One 4 mm polyp in the cecum, removed with a cold snare. Resected and retrieved.  - Two 3 to 6 mm polyps in the sigmoid colon and in the transverse colon, removed with a cold snare. Resected and retrieved.  - The examination was otherwise normal. -Pathology revealed tubular adenoma and sessile serrated adenoma without dysplasia.  No repeat colonoscopy needed due to age.    OV 04/01/23. Linzess  had worked well for her, taking MiraLAX with orange juice and had found out that bananas with the breakfast drink in the morning times would help her go.  Still having some bloating.  Burping after ginger ale but having good relief of symptoms.  Eating more fruits and baking chicken, rarely eating red meat.  Reflux well-controlled with medication and dietary avoidance.  Usually will use heat to help with hemorrhoid issues.  Advise Lactaid and Gas-X as needed for bloating.  Continue PPI daily as well as continuing  MiraLAX and high-fiber diet.  Consider ultrasound of abdomen if ongoing epigastric tenderness and mild distention.  Follow-up 6 months.  Last office visit 11/12/23.  Drinking Carnation instant breakfast mixed in bananas and have been going good but still having gas.  Passes gas with each bowel movement.  Continuing to feel so full of gas that she has abdominal distention.  Does feel better if she has a large bowel movement.  Takes aloe vera to  help with relief of symptoms.  Not taking any MiraLAX.  Going every day currently.  Reflux doing well on her medication.  Denies any nausea vomiting dysphagia loss of appetite or early satiety or weight loss.  Advise continue pantoprazole  40 mg once daily.  Continue Gas-X as needed.  Avoid gas-producing foods, continue high-fiber diet.  Discussed possibility of lactose intolerance and continue aloe vera and probiotic.  Today:  Discussed the use of AI scribe software for clinical note transcription with the patient, who gave verbal consent to proceed.  She experiences persistent bloating, particularly in the upper middle abdomen, which worsens after eating and taking her medication. Burping provides some relief, but she often struggles to pass gas from below, causing discomfort. She does not experience pain but feels significant pressure and discomfort until she can pass gas or burp.  She has been on pantoprazole  for a long time for stomach acid issues. She also uses aloe vera and a combination breakfast drink with a banana in the morning, which she believes helps with bowel regularity. She drinks small amounts of ginger ale or Pepsi to help induce burping and relieve pressure. No reports of melena, brbpr, diarrhea, abdominal pain, nausea, or vomiting.   Consuming milk and bananas in the morning often leads to bowel movements. She experiences relief from bloating and gas after consuming these but also notes increased gassiness. She describes a sensation of pressure in the upper abdomen, which she attributes to gas pushing against her hiatal hernia.  Her current medications include pantoprazole , which she takes regularly. She is cautious about her medication intake, having previously experienced issues with taking too much of the same medication for cholesterol, which she adjusted herself after noticing adverse effects.      Wt Readings from Last 5 Encounters:  04/29/24 166 lb (75.3 kg)  04/07/24 167  lb (75.8 kg)  03/18/24 167 lb (75.8 kg)  01/17/24 169 lb 12.8 oz (77 kg)  12/13/23 168 lb 1.9 oz (76.3 kg)    Current Outpatient Medications  Medication Sig Dispense Refill   amLODipine  (NORVASC ) 5 MG tablet TAKE 1 TABLET(5 MG) BY MOUTH DAILY 90 tablet 2   aspirin  EC 81 MG tablet Take 81 mg by mouth daily. Swallow whole.     carvedilol  (COREG ) 3.125 MG tablet TAKE 1 TABLET(3.125 MG) BY MOUTH TWICE DAILY WITH A MEAL 60 tablet 3   Cholecalciferol (VITAMIN D -3) 125 MCG (5000 UT) TABS Take by mouth.     fluticasone  (FLONASE ) 50 MCG/ACT nasal spray Place 2 sprays into both nostrils daily as needed for allergies.     magnesium  hydroxide (MILK OF MAGNESIA) 400 MG/5ML suspension Take 30 mLs by mouth daily as needed for mild constipation or moderate constipation.     Multiple Vitamin (MULTIVITAMIN) tablet Take 1 tablet by mouth daily.     olmesartan  (BENICAR ) 20 MG tablet TAKE 1 TABLET BY MOUTH EVERY NIGHT AT 10 PM FOR BLOOD PRESSURE 30 tablet 4   oxymetazoline (VICKS SINEX 12 HOUR DECONGEST) 0.05 % nasal spray Place 1  spray into both nostrils 2 (two) times daily as needed for congestion.     pantoprazole  (PROTONIX ) 40 MG tablet TAKE 1 TABLET(40 MG) BY MOUTH DAILY BEFORE BREAKFAST 90 tablet 1   rosuvastatin  (CRESTOR ) 40 MG tablet Take 1 tablet (40 mg total) by mouth daily. 90 tablet 3   cetirizine  (ZYRTEC ) 5 MG tablet Take one tablet by mouth two times daily as needed , for uncontrolled allergy symptoms (Patient not taking: Reported on 04/29/2024) 60 tablet 5   ezetimibe  (ZETIA ) 10 MG tablet Take 1 tablet (10 mg total) by mouth daily. (Patient not taking: Reported on 04/29/2024) 90 tablet 3   No current facility-administered medications for this visit.    Past Medical History:  Diagnosis Date   COPD (chronic obstructive pulmonary disease) (HCC)    Hiatal hernia without gangrene and obstruction 03/23/2015   Hypertension 2004   Lung nodule seen on imaging study    Nicotine  addiction    OA  (osteoarthritis) of knee    Obesity    Sinusitis     Past Surgical History:  Procedure Laterality Date   bilateral cataract surgery  7/27 & 03/01/09   Dr. Roz     cholecystectomy     COLONOSCOPY  04/2004   Dr. Keven Smith-->melanosis coli   COLONOSCOPY WITH ESOPHAGOGASTRODUODENOSCOPY (EGD) N/A 02/25/2013   Dr. Fields:Normal mucosa in the terminal ileum/Severe melanosis throughout the entire examined colon/ONE COLON POLYP REMOVED (tubular adenoma)/Mild diverticulosis  in the sigmoid colon/EGD:The mucosa of the esophagus appeared normal/Non-erosive gastritis, empiric dilation with Savary dilator   COLONOSCOPY WITH PROPOFOL  N/A 01/29/2023   Procedure: COLONOSCOPY WITH PROPOFOL ;  Surgeon: Cindie Carlin POUR, DO;  Location: [facility];  Service: Endoscopy;  Laterality: N/A;  930am, asa 2   ESOPHAGOGASTRODUODENOSCOPY  11/07/2010   Jenkins:hiatal hernia/no evidence of Barrett esophagitis.  The Z-line was noted to be at 39 cm from the teeth. CLO test negative   ESOPHAGOGASTRODUODENOSCOPY N/A 09/11/2013   Dr.Fields-  incomplete esophageal web in mid-esophagus, medium sized hialtal hernia, PUD. bx=focal erosion with inflammation and fibrosis   ESOPHAGOGASTRODUODENOSCOPY N/A 07/06/2014   healed ulcers, persistent erosions on aspirin , small hiatal hernia   ESOPHAGOGASTRODUODENOSCOPY N/A 02/05/2020   normal esophagus s/p dilation, normal stomach, normal duodenal bulb.    knee arthroscopy right     LEFT HEART CATHETERIZATION WITH CORONARY ANGIOGRAM N/A 04/08/2014   Procedure: LEFT HEART CATHETERIZATION WITH CORONARY ANGIOGRAM;  Surgeon: Victory LELON Claudene DOUGLAS, MD;  Location: [facility] CATH [facility];  Service: Cardiovascular;  Laterality: N/A;   MALONEY DILATION N/A 02/25/2013   Procedure: AGAPITO DILATION;  Surgeon: Margo LITTIE Haddock, MD;  Location: [facility];  Service: Endoscopy;  Laterality: N/A;   PARTIAL HYSTERECTOMY     POLYPECTOMY  01/29/2023   Procedure: POLYPECTOMY;  Surgeon: Cindie Carlin POUR, DO;  Location: [facility];  Service: Endoscopy;;   SAVORY DILATION N/A 02/25/2013   Procedure: SAVORY DILATION;  Surgeon: Margo LITTIE Haddock, MD;  Location: [facility];  Service: Endoscopy;  Laterality: N/A;   total knee arthroplasty right  05/29/2005   Dr. Margrette     Family History  Problem Relation Age of Onset   Diabetes Mother    Hypertension Mother    Coronary artery disease Mother    Pneumonia Father    Hypertension Sister    Hypertension Sister    Hypertension Brother        Visual merchandiser   Diabetes Brother    Hypertension Brother    Colon cancer Neg  Hx    Liver disease Neg Hx     Allergies as of 04/29/2024   (No Known Allergies)    Social History   Socioeconomic History   Marital status: Widowed    Spouse name: Not on file   Number of children: 4   Years of education: Not on file   Highest education level: Not on file  Occupational History   Occupation: employed     Employer: RETIRED  Tobacco Use   Smoking status: Some Days    Current packs/day: 0.10    Average packs/day: 0.1 packs/day for 61.8 years (6.2 ttl pk-yrs)    Types: Cigarettes    Start date: 07/23/1962   Smokeless tobacco: Never   Tobacco comments:    1 per day   Vaping Use   Vaping status: Never Used  Substance and Sexual Activity   Alcohol use: No    Alcohol/week: 0.0 standard drinks of alcohol   Drug use: No   Sexual activity: Not Currently  Other Topics Concern   Not on file  Social History Narrative   Not on file   Social Drivers of Health   Financial Resource Strain: Low Risk  (04/07/2024)   Overall Financial Resource Strain (CARDIA)    Difficulty of Paying Living Expenses: Not hard at all  Food Insecurity: No Food Insecurity (04/07/2024)   Hunger Vital Sign    Worried About Running Out of Food in the Last Year: Never true    Ran Out of Food in the Last Year: Never true  Transportation Needs: No Transportation Needs (04/07/2024)   PRAPARE - Administrator, Civil Service (Medical): No     Lack of Transportation (Non-Medical): No  Physical Activity: Inactive (04/07/2024)   Exercise Vital Sign    Days of Exercise per Week: 0 days    Minutes of Exercise per Session: 0 min  Stress: No Stress Concern Present (04/07/2024)   [employer] of Occupational Health - Occupational Stress Questionnaire    Feeling of Stress: Not at all  Social Connections: Moderately Integrated (04/07/2024)   Social Connection and Isolation Panel    Frequency of Communication with Friends and Family: More than three times a week    Frequency of Social Gatherings with Friends and Family: More than three times a week    Attends Religious Services: More than 4 times per year    Active Member of [employer] or Organizations: Yes    Attends Banker Meetings: More than 4 times per year    Marital Status: Widowed   Review of Systems   Gen: Denies fever, chills, anorexia. Denies fatigue, weakness, weight loss.  CV: Denies chest pain, palpitations, syncope, peripheral edema, and claudication. Resp: Denies dyspnea at rest, cough, wheezing, coughing up blood, and pleurisy. GI: See HPI Derm: Denies rash, itching, dry skin Psych: Denies depression, anxiety, memory loss, confusion. No homicidal or suicidal ideation.  Heme: Denies bruising, bleeding, and enlarged lymph nodes.  Physical Exam   BP 137/67 (BP Location: Right Arm, Patient Position: Sitting, Cuff Size: Normal)   Pulse 89   Temp 97.6 F (36.4 C) (Temporal)   Ht 5' 5 (1.651 m)   Wt 166 lb (75.3 kg)   BMI 27.62 kg/m   General:   Alert and oriented. No distress noted. Pleasant and cooperative.  Head:  Normocephalic and atraumatic. Eyes:  Conjuctiva clear without scleral icterus. Mouth:  Oral mucosa pink and moist. Good dentition. No lesions. Abdomen:  +BS, soft,  non-distended.  Mild ttp to epigastrium. No rebound or guarding. No HSM or masses noted. Rectal: deferred Msk:  Symmetrical without gross deformities. Normal  posture. Neurologic:  Alert and  oriented x4 Psych:  Alert and cooperative. Normal mood and affect.  Assessment  [name] is a 83 y.o. female presenting today with ongoing epigastric discomfort and gassiness/bloating.     Gastroesophageal reflux disease with persistent upper abdominal bloating and distension Persistent upper abdominal bloating and distension. Symptoms include burping and relief after passing gas. Denies nausea, vomiting, or overt abdominal pain. Current treatment with pantoprazole  may not be effective, has been on long term. She is uncertain if she has tried omeprazole  in the past but possibly. Differential includes gas production due to lactose intolerance, SIBO, IHO, IMO.  - Switch pantoprazole  to esomeprazole (Nexium) to improve reflux symptoms. - Trio Smart breath rest to evaluate for SIBO, IMO, IHO - Review breath test results to determine further treatment options - Xifaxin, neomycin, etc.  - As below - advised use of lactase supplement and decrease in dairy intake.  Constipation Constipation with difficulty passing gas, contributing to abdominal discomfort. Symptoms improve with dietary changes including banana and milk intake, suggesting possible dietary influence. Relief after passing gas indicates gas buildup may contribute to symptoms. - Continue intake of dietary fiber for bowel regularity.  Suspected lactose intolerance Suspected lactose intolerance contributing to gas production and abdominal symptoms. Has increased gassiness after consumption of carnation breakfast drink and drinking milk. No diarrhea but has fullness in epigastric region with consumption of dairy and relief after belching.  - Recommend over-the-counter lactase enzyme supplements when consuming dairy products. - Decreased dairy intake.      Follow up   Follow up 3 months.    Charmaine Melia, MSN, FNP-BC, AGACNP-BC [facility]

## 2024-04-30 ENCOUNTER — Ambulatory Visit (HOSPITAL_COMMUNITY)
Admission: RE | Admit: 2024-04-30 | Discharge: 2024-04-30 | Disposition: A | Discharge status: Home / Self Care | Source: Ambulatory Visit | Attending: Family Medicine | Admitting: Family Medicine

## 2024-04-30 DIAGNOSIS — Z1231 Encounter for screening mammogram for malignant neoplasm of breast: Secondary | ICD-10-CM | POA: Insufficient documentation

## 2024-05-08 ENCOUNTER — Encounter: Payer: Self-pay | Admitting: Family Medicine

## 2024-05-20 ENCOUNTER — Telehealth: Payer: Self-pay | Admitting: Gastroenterology

## 2024-05-20 NOTE — Telephone Encounter (Signed)
 Trio Smart breath test negative. Continue on nexium trial - how is this going? Continue using lactaid with dairy consumption.  Bloating could be secondary to constipation or functional in nature. Discuss further at follow up.   Charmaine Melia, MSN, APRN, FNP-BC, AGACNP-BC [facility]

## 2024-05-25 NOTE — Telephone Encounter (Signed)
 Spoke to pt, informed her of results and recommendations. Pt voiced understanding. Pt states she is still having some bloating and constipations.

## 2024-05-27 NOTE — Telephone Encounter (Signed)
 Noted informed pt of recommendations

## 2024-07-30 ENCOUNTER — Encounter: Payer: Self-pay | Admitting: Gastroenterology

## 2024-07-30 ENCOUNTER — Ambulatory Visit: Admitting: Gastroenterology

## 2024-08-26 ENCOUNTER — Ambulatory Visit: Admitting: Gastroenterology

## 2024-08-26 ENCOUNTER — Encounter: Payer: Self-pay | Admitting: Gastroenterology

## 2024-08-26 VITALS — BP 139/83 | HR 108 | Temp 97.8°F | Ht 65.0 in | Wt 162.4 lb

## 2024-08-26 DIAGNOSIS — R140 Abdominal distension (gaseous): Secondary | ICD-10-CM | POA: Diagnosis not present

## 2024-08-26 DIAGNOSIS — K219 Gastro-esophageal reflux disease without esophagitis: Secondary | ICD-10-CM

## 2024-08-26 DIAGNOSIS — K5900 Constipation, unspecified: Secondary | ICD-10-CM | POA: Diagnosis not present

## 2024-08-26 NOTE — Progress Notes (Signed)
 "  GI Office Note    Referring Provider: Antonetta Rollene BRAVO, MD Primary Care Physician:  Antonetta Rollene BRAVO, MD Primary Gastroenterologist: Carlin POUR. Cindie, DO  Date:  08/26/2024  ID:  [name], DOB November 02, 1940, MRN [id number]   Chief Complaint   Chief Complaint  Patient presents with   Follow-up    Follow up. Pain in the middle of stomach    History of Present Illness  [name] is a 84 y.o. female with a history of chronic GERD, chronic bloating, hiatal hernia, COPD, HTN, HLD, and arthritis presenting today with complaint of ongoing reflux and epigastric pain..  Colonoscopy in 2014 with 1 small tubular adenoma removed from the sigmoid colon.  Recommended repeat in 10 years.   EGD July 2021: -Normal esophagus s/p dilation -Normal stomach -normal duodenum -Advised to stop pantoprazole  and trial Nexium  daily. -If weight loss continued then recommended CTA for evaluation of mesenteric ischemia.   Office visit 01/03/2022.  GERD controlled on pantoprazole  once daily.  Does not need refills.  Also with chronic constipation.  Did note some intermittent abdominal bloating.  Previously given Linzess  samples but she was unable to afford this.  Taking milk of magnesia as needed which controls her symptoms.  No melena or BRBPR.  She was advised to continue milk of magnesia as needed for constipation and PPI once daily for reflux.  Advised to discuss risk versus benefits of repeat colonoscopy at next visit.   OV 12/25/22.  Reported when coughing she feels like something is pressing on her hernia site and then releases and goes down.  Had been coughing up phlegm at night and a yellowish color.  Does not take her allergy medicine regularly.  About a month prior she had taken antibiotics and prednisone  for her cough and phlegm.  Reflux doing well on pantoprazole  and avoiding triggers.  Noted a lot of gas moving from the mid to lower abdomen especially after eating steamed cabbage, rice, and  broccoli.  Had been taking Gas-X as needed.  Drinks ginger ale to help belch and to help with gas relief.  Denied any chest pain or shortness of breath.  Constipation is occasional.  Advised to proceed with colonoscopy, continue PPI.  Start Amitiza  and will trial MiraLAX if no improvement with Amitiza .  Continue milk of mag and Gas-X as needed.   Amitiza  was too expensive.  Provided some samples of Linzess .  Offered patient assistance for Amitiza .   Colonoscopy 01/29/2023: - Non- bleeding internal hemorrhoids.  - Diverticulosis in the sigmoid colon. - One 4 mm polyp in the cecum, removed with a cold snare. Resected and retrieved.  - Two 3 to 6 mm polyps in the sigmoid colon and in the transverse colon, removed with a cold snare. Resected and retrieved.  - The examination was otherwise normal. - Pathology revealed tubular adenoma and sessile serrated adenoma without dysplasia.   - No repeat colonoscopy needed due to age.    OV 04/01/23. Linzess  had worked well for her, taking MiraLAX with orange juice and had found out that bananas with the breakfast drink in the morning times would help her go.  Still having some bloating.  Burping after ginger ale but having good relief of symptoms.  Eating more fruits and baking chicken, rarely eating red meat.  Reflux well-controlled with medication and dietary avoidance.  Usually will use heat to help with hemorrhoid issues.  Advise Lactaid and Gas-X as needed for bloating.  Continue PPI daily as  well as continuing MiraLAX and high-fiber diet.  Consider ultrasound of abdomen if ongoing epigastric tenderness and mild distention.  Follow-up 6 months.   OV 11/12/23.  Drinking Carnation instant breakfast mixed in bananas and have been going good but still having gas.  Passes gas with each bowel movement.  Continuing to feel so full of gas that she has abdominal distention.  Does feel better if she has a large bowel movement.  Takes aloe vera to help with relief of  symptoms.  Not taking any MiraLAX.  Going every day currently.  Reflux doing well on her medication.  Denies any nausea vomiting dysphagia loss of appetite or early satiety or weight loss.  Advise continue pantoprazole  40 mg once daily.  Continue Gas-X as needed.  Avoid gas-producing foods, continue high-fiber diet.  Discussed possibility of lactose intolerance and continue aloe vera and probiotic.  Last office visit 04/29/2024.  Continuing to complain of upper abdominal bloating worsened after eating and taking medication.  Belching provides mild relief but still struggles to pass flatulence which causes discomfort.  Noted to be on pantoprazole  for extended period of time, using aloe vera with combination of breakfast drinking bananas in the morning which helps with bowel regularity.  Still drinking Carnation instant breakfast drink.  This was suspected to be a contributor to her symptoms.  Drinks ginger ale and Pepsi on occasion to induce belching.  Experiences relief from bloating and gas after consuming milk of bananas in the morning but then will note some increased gassiness.  Trio smart breath test ordered, advised switching from pantoprazole  to Nexium  and advised Lactaid supplementation.  Advise daily fiber intake for bowel regularity.  05/25/2024 patient reported she was still having difficulty with constipation and bloating therefore I advised her to start MiraLAX 17 g daily  Today:  Discussed the use of AI scribe software for clinical note transcription with the patient, who gave verbal consent to proceed.  Longstanding acid reflux was previously managed with pantoprazole , but symptoms of upper abdominal burning and pressure increased after a course of prednisone  and antibiotics for an upper respiratory illness. Discomfort is described as burning and tightness localized to the upper abdomen, sometimes severe but not radiating beyond the top of the stomach. Symptoms worsened after prednisone  and  during a period without acid reflux medication due to pharmacy issues.  Difficulty obtaining esomeprazole  (Nexium ) led to a lapse in acid reflux medication until January 30th, after which she resumed therapy. Pantoprazole  was previously used for an extended period, but the patient reported her body 'got used to it.' During the medication gap, ginger ale provided some relief through belching. Bloating, upper abdominal tightness, and frequent belching are present, with discomfort often relieved by burping. Pressure and tightness are most pronounced in the upper abdomen and can worsen with activity such as walking or reaching overhead. A prior episode several years ago involved faintness while pulling a trash can, leading to a diagnosis of hiatal hernia. Breathing exercises help relieve symptoms.  Bowel movements are regular with use of Miralax and Dulcolax as needed. No burning is associated with breathing, but she describes upper abdominal pressure and tightness, sometimes feeling hard as a brick. Increased soreness in the upper abdomen occurs after physical exertion, such as shoveling snow.  She lives alone, manages her own medications and daily activities, and keeps a detailed calendar to track appointments and medications. Frustration with pharmacy communication and electronic systems contributed to the recent lapse in medication.  Wt Readings from Last 6 Encounters:  08/26/24 162 lb 6.4 oz (73.7 kg)  04/29/24 166 lb (75.3 kg)  04/07/24 167 lb (75.8 kg)  03/18/24 167 lb (75.8 kg)  01/17/24 169 lb 12.8 oz (77 kg)  12/13/23 168 lb 1.9 oz (76.3 kg)    Body mass index is 27.02 kg/m.   Current Outpatient Medications  Medication Sig Dispense Refill   amLODipine  (NORVASC ) 5 MG tablet TAKE 1 TABLET(5 MG) BY MOUTH DAILY 90 tablet 2   aspirin  EC 81 MG tablet Take 81 mg by mouth daily. Swallow whole.     carvedilol  (COREG ) 3.125 MG tablet TAKE 1 TABLET(3.125 MG) BY MOUTH TWICE DAILY WITH A  MEAL 60 tablet 3   Cholecalciferol (VITAMIN D -3) 125 MCG (5000 UT) TABS Take by mouth.     esomeprazole  (NEXIUM ) 40 MG capsule Take 1 capsule (40 mg total) by mouth daily at 12 noon. 30 capsule 4   fluticasone  (FLONASE ) 50 MCG/ACT nasal spray Place 2 sprays into both nostrils daily as needed for allergies.     magnesium  hydroxide (MILK OF MAGNESIA) 400 MG/5ML suspension Take 30 mLs by mouth daily as needed for mild constipation or moderate constipation.     Multiple Vitamin (MULTIVITAMIN) tablet Take 1 tablet by mouth daily.     olmesartan  (BENICAR ) 20 MG tablet TAKE 1 TABLET BY MOUTH EVERY NIGHT AT 10 PM FOR BLOOD PRESSURE 30 tablet 4   oxymetazoline (VICKS SINEX 12 HOUR DECONGEST) 0.05 % nasal spray Place 1 spray into both nostrils 2 (two) times daily as needed for congestion.     rosuvastatin  (CRESTOR ) 40 MG tablet Take 1 tablet (40 mg total) by mouth daily. 90 tablet 3   cetirizine  (ZYRTEC ) 5 MG tablet Take one tablet by mouth two times daily as needed , for uncontrolled allergy symptoms (Patient not taking: Reported on 08/26/2024) 60 tablet 5   No current facility-administered medications for this visit.    Past Medical History:  Diagnosis Date   COPD (chronic obstructive pulmonary disease) (HCC)    GERD (gastroesophageal reflux disease)    Hiatal hernia without gangrene and obstruction 03/23/2015   Hypertension 2004   Lung nodule seen on imaging study    Nicotine  addiction    OA (osteoarthritis) of knee    Obesity    Sinusitis     Past Surgical History:  Procedure Laterality Date   bilateral cataract surgery  7/27 & 03/01/09   Dr. Roz     cholecystectomy     COLONOSCOPY  04/2004   Dr. Keven Smith-->melanosis coli   COLONOSCOPY WITH ESOPHAGOGASTRODUODENOSCOPY (EGD) N/A 02/25/2013   Dr. Fields:Normal mucosa in the terminal ileum/Severe melanosis throughout the entire examined colon/ONE COLON POLYP REMOVED (tubular adenoma)/Mild diverticulosis  in the sigmoid colon/EGD:The mucosa  of the esophagus appeared normal/Non-erosive gastritis, empiric dilation with Savary dilator   COLONOSCOPY WITH PROPOFOL  N/A 01/29/2023   Procedure: COLONOSCOPY WITH PROPOFOL ;  Surgeon: Cindie Carlin POUR, DO;  Location: [facility];  Service: Endoscopy;  Laterality: N/A;  930am, asa 2   ESOPHAGOGASTRODUODENOSCOPY  11/07/2010   Jenkins:hiatal hernia/no evidence of Barrett esophagitis.  The Z-line was noted to be at 39 cm from the teeth. CLO test negative   ESOPHAGOGASTRODUODENOSCOPY N/A 09/11/2013   Dr.Fields-  incomplete esophageal web in mid-esophagus, medium sized hialtal hernia, PUD. bx=focal erosion with inflammation and fibrosis   ESOPHAGOGASTRODUODENOSCOPY N/A 07/06/2014   healed ulcers, persistent erosions on aspirin , small hiatal hernia   ESOPHAGOGASTRODUODENOSCOPY N/A 02/05/2020   normal esophagus s/p  dilation, normal stomach, normal duodenal bulb.    knee arthroscopy right     LEFT HEART CATHETERIZATION WITH CORONARY ANGIOGRAM N/A 04/08/2014   Procedure: LEFT HEART CATHETERIZATION WITH CORONARY ANGIOGRAM;  Surgeon: Victory LELON Claudene DOUGLAS, MD;  Location: [facility];  Service: Cardiovascular;  Laterality: N/A;   MALONEY DILATION N/A 02/25/2013   Procedure: AGAPITO DILATION;  Surgeon: Margo LITTIE Haddock, MD;  Location: [facility];  Service: Endoscopy;  Laterality: N/A;   PARTIAL HYSTERECTOMY     POLYPECTOMY  01/29/2023   Procedure: POLYPECTOMY;  Surgeon: Cindie Carlin POUR, DO;  Location: [facility];  Service: Endoscopy;;   SAVORY DILATION N/A 02/25/2013   Procedure: SAVORY DILATION;  Surgeon: Margo LITTIE Haddock, MD;  Location: [facility];  Service: Endoscopy;  Laterality: N/A;   total knee arthroplasty right  05/29/2005   Dr. Margrette     Family History  Problem Relation Age of Onset   Diabetes Mother    Hypertension Mother    Coronary artery disease Mother    Pneumonia Father    Hypertension Sister    Hypertension Sister    Hypertension Brother        pace maker   Diabetes Brother     Hypertension Brother    Colon cancer Neg Hx    Liver disease Neg Hx     Allergies as of 08/26/2024   (No Known Allergies)    Social History   Socioeconomic History   Marital status: Widowed    Spouse name: Not on file   Number of children: 4   Years of education: Not on file   Highest education level: Not on file  Occupational History   Occupation: employed     Employer: RETIRED  Tobacco Use   Smoking status: Some Days    Current packs/day: 0.10    Average packs/day: 0.1 packs/day for 62.1 years (6.2 ttl pk-yrs)    Types: Cigarettes    Start date: 07/23/1962   Smokeless tobacco: Never   Tobacco comments:    1 per day   Vaping Use   Vaping status: Never Used  Substance and Sexual Activity   Alcohol use: No    Alcohol/week: 0.0 standard drinks of alcohol   Drug use: No   Sexual activity: Not Currently  Other Topics Concern   Not on file  Social History Narrative   Not on file   Social Drivers of Health   Tobacco Use: High Risk (08/26/2024)   Patient History    Smoking Tobacco Use: Some Days    Smokeless Tobacco Use: Never    Passive Exposure: Not on file  Financial Resource Strain: Low Risk (04/07/2024)   Overall Financial Resource Strain (CARDIA)    Difficulty of Paying Living Expenses: Not hard at all  Food Insecurity: No Food Insecurity (04/07/2024)   Epic    Worried About Programme Researcher, Broadcasting/film/video in the Last Year: Never true    Ran Out of Food in the Last Year: Never true  Transportation Needs: No Transportation Needs (04/07/2024)   Epic    Lack of Transportation (Medical): No    Lack of Transportation (Non-Medical): No  Physical Activity: Inactive (04/07/2024)   Exercise Vital Sign    Days of Exercise per Week: 0 days    Minutes of Exercise per Session: 0 min  Stress: No Stress Concern Present (04/07/2024)   [employer] of Occupational Health - Occupational Stress Questionnaire    Feeling of Stress: Not at all  Social  Connections: Moderately Integrated  (04/07/2024)   Social Connection and Isolation Panel    Frequency of Communication with Friends and Family: More than three times a week    Frequency of Social Gatherings with Friends and Family: More than three times a week    Attends Religious Services: More than 4 times per year    Active Member of [employer] or Organizations: Yes    Attends Banker Meetings: More than 4 times per year    Marital Status: Widowed  Depression (PHQ2-9): Low Risk (04/07/2024)   Depression (PHQ2-9)    PHQ-2 Score: 0  Alcohol Screen: Low Risk (04/07/2024)   Alcohol Screen    Last Alcohol Screening Score (AUDIT): 0  Housing: Low Risk (04/07/2024)   Epic    Unable to Pay for Housing in the Last Year: No    Number of Times Moved in the Last Year: 0    Homeless in the Last Year: No  Utilities: Not At Risk (04/07/2024)   Epic    Threatened with loss of utilities: No  Health Literacy: Adequate Health Literacy (04/07/2024)   B1300 Health Literacy    Frequency of need for help with medical instructions: Never    Review of Systems   Gen: Denies fever, chills, anorexia. Denies fatigue, weakness, weight loss.  CV: Denies chest pain, palpitations, syncope, peripheral edema, and claudication. Resp: Denies dyspnea at rest, cough, wheezing, coughing up blood, and pleurisy. GI: See HPI Derm: Denies rash, itching, dry skin Psych: Denies depression, anxiety, memory loss, confusion. No homicidal or suicidal ideation.  Heme: Denies bruising, bleeding, and enlarged lymph nodes.  Physical Exam   BP 139/83 (BP Location: Right Arm, Patient Position: Sitting, Cuff Size: Large)   Pulse (!) 108   Temp 97.8 F (36.6 C) (Temporal)   Ht 5' 5 (1.651 m)   Wt 162 lb 6.4 oz (73.7 kg)   BMI 27.02 kg/m   General:   Alert and oriented. No distress noted. Pleasant and cooperative.  Head:  Normocephalic and atraumatic. Eyes:  Conjuctiva clear without scleral icterus. Mouth:  Oral mucosa pink and moist. Good dentition. No  lesions. Lungs:  Clear to auscultation bilaterally. No wheezes, rales, or rhonchi. No distress.  Heart:  S1, S2 present without murmurs appreciated.  Abdomen:  +BS, soft, non-tender and non-distended. No rebound or guarding. No HSM or masses noted. Rectal: deferred Msk:  Symmetrical without gross deformities. Normal posture. Extremities:  Without edema. Neurologic:  Alert and  oriented x4 Psych:  Alert and cooperative. Normal mood and affect.  Assessment & Plan  [name] is a 84 y.o. female presenting today with issues with ongoing reflux.     Gastroesophageal reflux disease, bloating, possible hiatal hernia Chronic, intermittently symptomatic gastroesophageal reflux disease with recent exacerbation likely secondary to steroid use and medication lapse. Currently trialing esomeprazole ; prefers least invasive management and agrees to medication trial prior to further diagnostics. Mild ttp to epigastrium on exam today with some tightness noted to this region.  - Educated her on dietary and activity modifications to minimize symptoms. - Encouraged continuation of breathing exercises for symptomatic relief. - Recommended continuation of esomeprazole  (Nexium ) 40 mg daily for at least one month and to complete the current bottle. - Instructed her to call at the end of the month if symptoms do not improve. If symptoms improved will continue nexium .  - If symptoms persist, will order upper GI series to evaluate for hiatal hernia or other structural abnormalities. - If upper GI  series is inconclusive or symptoms persist, will schedule upper endoscopy with possible biopsy.  Constipation Mild symptoms with recent improvement on current bowel regimen and dietary choices; bloating attributed to air retention rather than stool retention. - Advised continuation of current bowel regimen as needed, including Miralax and Dulcolax if necessary. - Reinforced that bloating is likely related to air  retention rather than constipation, but advised monitoring for changes in bowel habits.     Follow up   Follow up 4 months, sooner if needed.   Charmaine Melia, MSN, FNP-BC, AGACNP-BC [facility] "

## 2024-08-26 NOTE — Patient Instructions (Addendum)
 YOUR PLAN: GASTROESOPHAGEAL REFLUX DISEASE AND POSSIBLE HIATAL HERNIA: You have chronic acid reflux that has recently worsened, possibly due to steroid use and a lapse in medication. -Continue taking esomeprazole  (Nexium ) 40 mg daily for at least one month and complete the current bottle.. -Continue breathing exercises for symptom relief of pressure in the upper abdomen.  -Follow a GERD diet:  Avoid fried, fatty, greasy, spicy, citrus foods. Avoid caffeine and carbonated beverages. Avoid chocolate. Try eating 4-6 small meals a day rather than 3 large meals. Do not eat within 3 hours of laying down. Prop head of bed up on wood or bricks to create a 6 inch incline.  -Call at the end of the month if symptoms do not improve so we can perform a special X-ray to assess for hernia. -If the upper GI series is inconclusive or symptoms persist, we will schedule an upper endoscopy with possible biopsy.  CONSTIPATION: You have mild constipation symptoms that have recently improved with your current bowel regimen. Your bloating is likely due to air retention rather than stool retention. -Continue your current bowel regimen as needed, including Miralax and Dulcolax. -Monitor for any changes in your bowel habits.  Follow up in 4 months but please call when you are close to finishing your nexium .   It was a pleasure to see you today. I want to create trusting relationships with patients. If you receive a survey regarding your visit,  I greatly appreciate you taking time to fill this out on paper or through your MyChart. I value your feedback.  Charmaine Melia, MSN, FNP-BC, AGACNP-BC [facility]

## 2024-09-18 ENCOUNTER — Ambulatory Visit: Payer: Self-pay | Admitting: Family Medicine

## 2025-04-12 ENCOUNTER — Ambulatory Visit
